# Patient Record
Sex: Male | Born: 1960
Health system: Southern US, Community
[De-identification: ages and names within clinical notes are randomized; demographics above are authoritative.]

## PROBLEM LIST (undated history)

## (undated) ENCOUNTER — Emergency Department (HOSPITAL_BASED_OUTPATIENT_CLINIC_OR_DEPARTMENT_OTHER): Discharge status: Absent without leave | Payer: 59

## (undated) DIAGNOSIS — E039 Hypothyroidism, unspecified: Secondary | ICD-10-CM

## (undated) DIAGNOSIS — F329 Major depressive disorder, single episode, unspecified: Secondary | ICD-10-CM

## (undated) DIAGNOSIS — G7111 Myotonic muscular dystrophy: Secondary | ICD-10-CM

## (undated) DIAGNOSIS — G4733 Obstructive sleep apnea (adult) (pediatric): Secondary | ICD-10-CM

## (undated) DIAGNOSIS — R42 Dizziness and giddiness: Secondary | ICD-10-CM

## (undated) DIAGNOSIS — F32A Depression, unspecified: Secondary | ICD-10-CM

## (undated) DIAGNOSIS — R911 Solitary pulmonary nodule: Secondary | ICD-10-CM

## (undated) DIAGNOSIS — J9610 Chronic respiratory failure, unspecified whether with hypoxia or hypercapnia: Secondary | ICD-10-CM

## (undated) DIAGNOSIS — R7401 Elevation of levels of liver transaminase levels: Secondary | ICD-10-CM

## (undated) DIAGNOSIS — R740 Nonspecific elevation of levels of transaminase and lactic acid dehydrogenase [LDH]: Secondary | ICD-10-CM

## (undated) DIAGNOSIS — Z85850 Personal history of malignant neoplasm of thyroid: Secondary | ICD-10-CM

## (undated) DIAGNOSIS — K219 Gastro-esophageal reflux disease without esophagitis: Secondary | ICD-10-CM

## (undated) DIAGNOSIS — I517 Cardiomegaly: Secondary | ICD-10-CM

## (undated) DIAGNOSIS — K224 Dyskinesia of esophagus: Secondary | ICD-10-CM

## (undated) DIAGNOSIS — E785 Hyperlipidemia, unspecified: Secondary | ICD-10-CM

## (undated) DIAGNOSIS — E669 Obesity, unspecified: Secondary | ICD-10-CM

## (undated) DIAGNOSIS — K5909 Other constipation: Secondary | ICD-10-CM

## (undated) DIAGNOSIS — N200 Calculus of kidney: Secondary | ICD-10-CM

## (undated) DIAGNOSIS — J690 Pneumonitis due to inhalation of food and vomit: Secondary | ICD-10-CM

## (undated) DIAGNOSIS — E66811 Obesity, class 1: Secondary | ICD-10-CM

## (undated) DIAGNOSIS — K760 Fatty (change of) liver, not elsewhere classified: Secondary | ICD-10-CM

## (undated) DIAGNOSIS — S0292XA Unspecified fracture of facial bones, initial encounter for closed fracture: Secondary | ICD-10-CM

## (undated) DIAGNOSIS — N529 Male erectile dysfunction, unspecified: Secondary | ICD-10-CM

## (undated) DIAGNOSIS — F909 Attention-deficit hyperactivity disorder, unspecified type: Secondary | ICD-10-CM

## (undated) HISTORY — DX: Depression, unspecified: F32.A

## (undated) HISTORY — DX: Fatty (change of) liver, not elsewhere classified: K76.0

## (undated) HISTORY — DX: Pneumonitis due to inhalation of food and vomit: J69.0

## (undated) HISTORY — DX: Solitary pulmonary nodule: R91.1

## (undated) HISTORY — DX: Major depressive disorder, single episode, unspecified: F32.9

## (undated) HISTORY — DX: Unspecified fracture of facial bones, initial encounter for closed fracture: S02.92XA

## (undated) HISTORY — PX: TRANSTHORACIC ECHOCARDIOGRAM: SHX275

## (undated) HISTORY — DX: Attention-deficit hyperactivity disorder, unspecified type: F90.9

## (undated) HISTORY — DX: Other constipation: K59.09

## (undated) HISTORY — DX: Obesity, unspecified: E66.9

## (undated) HISTORY — DX: Gastro-esophageal reflux disease without esophagitis: K21.9

## (undated) HISTORY — DX: Cardiomegaly: I51.7

## (undated) HISTORY — DX: Chronic respiratory failure, unspecified whether with hypoxia or hypercapnia: J96.10

## (undated) HISTORY — DX: Obesity, class 1: E66.811

## (undated) HISTORY — DX: Hypothyroidism, unspecified: E03.9

## (undated) HISTORY — PX: EXTRACORPOREAL SHOCK WAVE LITHOTRIPSY: SHX1557

## (undated) HISTORY — DX: Personal history of malignant neoplasm of thyroid: Z85.850

## (undated) HISTORY — DX: Hyperlipidemia, unspecified: E78.5

## (undated) HISTORY — DX: Dyskinesia of esophagus: K22.4

## (undated) HISTORY — DX: Nonspecific elevation of levels of transaminase and lactic acid dehydrogenase (ldh): R74.0

## (undated) HISTORY — DX: Dizziness and giddiness: R42

## (undated) HISTORY — DX: Male erectile dysfunction, unspecified: N52.9

## (undated) HISTORY — DX: Calculus of kidney: N20.0

## (undated) HISTORY — DX: Obstructive sleep apnea (adult) (pediatric): G47.33

## (undated) HISTORY — DX: Elevation of levels of liver transaminase levels: R74.01

---

## 1998-06-15 ENCOUNTER — Ambulatory Visit (HOSPITAL_COMMUNITY): Admission: RE | Admit: 1998-06-15 | Discharge: 1998-06-15 | Payer: Self-pay | Admitting: Gastroenterology

## 2007-09-03 ENCOUNTER — Ambulatory Visit: Payer: Self-pay | Admitting: Gastroenterology

## 2007-09-10 ENCOUNTER — Ambulatory Visit (HOSPITAL_COMMUNITY): Admission: RE | Admit: 2007-09-10 | Discharge: 2007-09-10 | Payer: Self-pay | Admitting: Gastroenterology

## 2007-09-13 ENCOUNTER — Ambulatory Visit: Payer: Self-pay | Admitting: Gastroenterology

## 2007-09-13 ENCOUNTER — Encounter: Payer: Self-pay | Admitting: Internal Medicine

## 2007-09-13 HISTORY — PX: COLONOSCOPY: SHX174

## 2007-09-13 LAB — HM COLONOSCOPY: HM Colonoscopy: NORMAL

## 2007-11-10 ENCOUNTER — Emergency Department (HOSPITAL_COMMUNITY): Admission: EM | Admit: 2007-11-10 | Discharge: 2007-11-10 | Payer: Self-pay | Admitting: Emergency Medicine

## 2007-11-19 ENCOUNTER — Ambulatory Visit (HOSPITAL_COMMUNITY): Admission: RE | Admit: 2007-11-19 | Discharge: 2007-11-19 | Payer: Self-pay | Admitting: Urology

## 2007-11-22 ENCOUNTER — Emergency Department (HOSPITAL_COMMUNITY): Admission: EM | Admit: 2007-11-22 | Discharge: 2007-11-22 | Payer: Self-pay | Admitting: Emergency Medicine

## 2008-03-20 DIAGNOSIS — K219 Gastro-esophageal reflux disease without esophagitis: Secondary | ICD-10-CM | POA: Insufficient documentation

## 2008-03-20 DIAGNOSIS — G4730 Sleep apnea, unspecified: Secondary | ICD-10-CM | POA: Insufficient documentation

## 2008-03-20 DIAGNOSIS — G7111 Myotonic muscular dystrophy: Secondary | ICD-10-CM

## 2008-03-20 DIAGNOSIS — F329 Major depressive disorder, single episode, unspecified: Secondary | ICD-10-CM

## 2008-06-29 ENCOUNTER — Emergency Department (HOSPITAL_COMMUNITY): Admission: EM | Admit: 2008-06-29 | Discharge: 2008-06-29 | Payer: Self-pay | Admitting: Family Medicine

## 2008-12-26 DIAGNOSIS — Z85850 Personal history of malignant neoplasm of thyroid: Secondary | ICD-10-CM

## 2008-12-26 HISTORY — DX: Personal history of malignant neoplasm of thyroid: Z85.850

## 2009-08-26 HISTORY — PX: THYROIDECTOMY: SHX17

## 2011-05-10 NOTE — Assessment & Plan Note (Signed)
[facility]                         GASTROENTEROLOGY OFFICE NOTE   [name], [name]                          MRN:          [id number]  DATE:09/03/2007                            DOB:          10-22-1961    PROBLEMS:  Dysphagia.   HISTORY OF PRESENT ILLNESS:  [name] is a 50 year old white male  referred through the courtesy of Dr.  Quintella Reichert for evaluation.  He is  complaining of dysphagia which I believe is probably due to difficulty  with deglutition.  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  He frequently develops a fullness and bad taste  in his mouth upon awakening and has some hoarseness, though, he denies  sore throat or frank regurgitation.  He has occasional pyrosis.  He has  lost almost 40 pounds over several months which he attributes to  Adderall and [employer].   PAST MEDICAL HISTORY:  1. Sleep apnea.  2. Depression.   FAMILY HISTORY:  Pertinent for a father with colon cancer.   MEDICATIONS:  Adderall 20 mg t.i.d.   ALLERGIES:  No known drug allergies.   SOCIAL HISTORY:  He does not smoke.  He drinks rarely.  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   REVIEW OF SYSTEMS:  Positive for sleeping problems, voice changes and  depression.   PHYSICAL EXAMINATION:  VITAL SIGNS:  Pulse 68, blood pressure 96/64,  weight 158.  HEENT:  EOMI.  PERRLA.  Sclerae are anicteric.  Conjunctivae are pink.  NECK:  Supple without thyromegaly, adenopathy or carotid bruits.  CHEST:  Clear to auscultation and percussion without adventitious  sounds.  CARDIAC:  Regular rhythm; normal S1 S2.  There are no murmurs, gallops  or rubs.  ABDOMEN:  Bowel sounds are normoactive.  Abdomen is soft, nontender and  nondistended.  There are no abdominal masses, tenderness, splenic  enlargement or hepatomegaly.  EXTREMITIES:  Full range of motion.  No cyanosis, clubbing or edema.  RECTAL:  Deferred.   IMPRESSION:  1. Dysphagia.  I suspect this is neurogenic in origin.  Esophageal      stricture is a possibility and ought to be ruled out, however.  2. Gastroesophageal reflux disease.  3. Family history of colorectal carcinoma.   RECOMMENDATION:  1. Barium swallow.  2. Trial of Zegerid 40 mg q.h.s.  3. Screening colonoscopy.     Barbette Hair. Arlyce Dice, MD,FACG  Electronically Signed    RDK/MedQ  DD: 09/03/2007  DT: 09/03/2007  Job #: [id number]   cc:   Quentin Mulling, MD

## 2011-05-10 NOTE — Letter (Signed)
September 03, 2007    Gerarda Fraction   RE:  ARK, AGRUSA  MRN:  [id number]  /  DOB:  [birth year]   Dear Mr. Claudette Laws:   It is my pleasure to have treated you recently as a new patient in my  office.  I appreciate your confidence and the opportunity to participate  in your care.   Since I do have a busy inpatient endoscopy schedule and office schedule,  my office hours vary weekly.  I am, however, available for emergency  calls every day through my office.  If I cannot promptly meet an urgent  office appointment, another one of our gastroenterologists will be able  to assist you.   My well-trained staff are prepared to help you at all times.  For  emergencies after office hours, a physician from our gastroenterology  section is always available through my 24-hour answering service.   While you are under my care, I encourage discussion of your questions  and concerns, and I will be happy to return your calls as soon as I am  available.   Once again, I welcome you as a new patient and I look forward to a happy  and healthy relationship.    Sincerely,      Barbette Hair. Arlyce Dice, MD,FACG  Electronically Signed   RDK/MedQ  DD: 09/03/2007  DT: 09/03/2007  Job #: [id number]

## 2011-05-10 NOTE — Letter (Signed)
September 03, 2007    Lacretia Leigh. Quintella Reichert, M.D.  [address]  [place], [place] [address]   RE:  [name], [name]  MRN:  [id number]  /  DOB:  06-03-1961   Dear Dr. Quintella Reichert:   Upon your kind referral, I had the pleasure of evaluating your patient  and I am pleased to offer my findings.  I saw in the office today.  Enclosed is a copy of my progress note that details my findings and  recommendations.   Thank you for the opportunity to participate in your patient's care.    Sincerely,      Barbette Hair. Arlyce Dice, MD,FACG  Electronically Signed    RDK/MedQ  DD: 09/03/2007  DT: 09/03/2007  Job #: [id number]

## 2011-06-26 DIAGNOSIS — R42 Dizziness and giddiness: Secondary | ICD-10-CM

## 2011-06-26 DIAGNOSIS — S0292XA Unspecified fracture of facial bones, initial encounter for closed fracture: Secondary | ICD-10-CM

## 2011-06-26 HISTORY — DX: Dizziness and giddiness: R42

## 2011-06-26 HISTORY — DX: Unspecified fracture of facial bones, initial encounter for closed fracture: S02.92XA

## 2011-07-08 ENCOUNTER — Emergency Department (HOSPITAL_COMMUNITY)
Admission: EM | Admit: 2011-07-08 | Discharge: 2011-07-08 | Disposition: A | Discharge status: Home / Self Care | Payer: 59 | Attending: Emergency Medicine | Admitting: Emergency Medicine

## 2011-07-08 ENCOUNTER — Encounter (HOSPITAL_COMMUNITY): Payer: Self-pay | Admitting: Radiology

## 2011-07-08 ENCOUNTER — Emergency Department (HOSPITAL_COMMUNITY): Payer: 59

## 2011-07-08 DIAGNOSIS — R51 Headache: Secondary | ICD-10-CM | POA: Insufficient documentation

## 2011-07-08 DIAGNOSIS — S02109A Fracture of base of skull, unspecified side, initial encounter for closed fracture: Secondary | ICD-10-CM | POA: Insufficient documentation

## 2011-07-08 DIAGNOSIS — S02401A Maxillary fracture, unspecified, initial encounter for closed fracture: Secondary | ICD-10-CM | POA: Insufficient documentation

## 2011-07-08 DIAGNOSIS — S0180XA Unspecified open wound of other part of head, initial encounter: Secondary | ICD-10-CM | POA: Insufficient documentation

## 2011-07-08 DIAGNOSIS — IMO0002 Reserved for concepts with insufficient information to code with codable children: Secondary | ICD-10-CM | POA: Insufficient documentation

## 2011-07-08 DIAGNOSIS — F988 Other specified behavioral and emotional disorders with onset usually occurring in childhood and adolescence: Secondary | ICD-10-CM | POA: Insufficient documentation

## 2011-07-08 DIAGNOSIS — W010XXA Fall on same level from slipping, tripping and stumbling without subsequent striking against object, initial encounter: Secondary | ICD-10-CM | POA: Insufficient documentation

## 2011-07-08 DIAGNOSIS — R6884 Jaw pain: Secondary | ICD-10-CM | POA: Insufficient documentation

## 2011-07-08 DIAGNOSIS — Z79899 Other long term (current) drug therapy: Secondary | ICD-10-CM | POA: Insufficient documentation

## 2011-07-08 DIAGNOSIS — G7111 Myotonic muscular dystrophy: Secondary | ICD-10-CM | POA: Insufficient documentation

## 2011-07-08 HISTORY — DX: Myotonic muscular dystrophy: G71.11

## 2011-07-15 ENCOUNTER — Encounter (HOSPITAL_COMMUNITY): Payer: Self-pay | Admitting: Radiology

## 2011-07-15 ENCOUNTER — Emergency Department (HOSPITAL_COMMUNITY): Payer: 59

## 2011-07-15 ENCOUNTER — Emergency Department (HOSPITAL_COMMUNITY)
Admission: EM | Admit: 2011-07-15 | Discharge: 2011-07-15 | Disposition: A | Discharge status: Home / Self Care | Payer: 59 | Attending: Emergency Medicine | Admitting: Emergency Medicine

## 2011-07-15 DIAGNOSIS — G7111 Myotonic muscular dystrophy: Secondary | ICD-10-CM | POA: Insufficient documentation

## 2011-07-15 DIAGNOSIS — H538 Other visual disturbances: Secondary | ICD-10-CM | POA: Insufficient documentation

## 2011-07-15 DIAGNOSIS — Z79899 Other long term (current) drug therapy: Secondary | ICD-10-CM | POA: Insufficient documentation

## 2011-07-15 DIAGNOSIS — E039 Hypothyroidism, unspecified: Secondary | ICD-10-CM | POA: Insufficient documentation

## 2011-07-15 DIAGNOSIS — R5381 Other malaise: Secondary | ICD-10-CM | POA: Insufficient documentation

## 2011-07-15 DIAGNOSIS — F988 Other specified behavioral and emotional disorders with onset usually occurring in childhood and adolescence: Secondary | ICD-10-CM | POA: Insufficient documentation

## 2011-07-15 DIAGNOSIS — R269 Unspecified abnormalities of gait and mobility: Secondary | ICD-10-CM | POA: Insufficient documentation

## 2011-07-15 DIAGNOSIS — R42 Dizziness and giddiness: Secondary | ICD-10-CM | POA: Insufficient documentation

## 2011-07-15 DIAGNOSIS — R4789 Other speech disturbances: Secondary | ICD-10-CM | POA: Insufficient documentation

## 2011-07-15 LAB — CBC
HCT: 41.9 % (ref 39.0–52.0)
Hemoglobin: 14.5 g/dL (ref 13.0–17.0)
Platelets: 159 10*3/uL (ref 150–400)

## 2011-07-15 LAB — BASIC METABOLIC PANEL
CO2: 32 mEq/L (ref 19–32)
Chloride: 101 mEq/L (ref 96–112)
GFR calc non Af Amer: >60 mL/min (ref >60)
Glucose, Bld: 82 mg/dL (ref 70–99)

## 2011-07-15 LAB — TROPONIN I: Troponin I: <0.3 ng/mL (ref <0.30)

## 2011-07-19 NOTE — Consult Note (Signed)
[name], [name]                 ACCOUNT NO.:  [id number]  MEDICAL RECORD NO.:  [id number]  LOCATION:  [facility]                         FACILITY:  [facility]  PHYSICIAN:  Kinnie Scales. Annalee Genta, M.D.DATE OF BIRTH:  1961/11/30  DATE OF CONSULTATION:  07/08/2011 DATE OF DISCHARGE:  07/08/2011                                CONSULTATION   LOCATION:  [facility] emergency department.  BRIEF HISTORY:  The patient is a 50 year old white male, who presents to the emergency department after suffering a fall earlier today.  The patient has a medical history of myotonic dystrophy which has been progressed over the last 20 years.  Symptoms include muscle weakness, wasting, and chronic dysphagia.  The patient was walking when he tripped and fell, striking his face, and primarily his left temporal region.  He was admitted to the emergency department with lacerations, bleeding, and facial swelling.  A CT scan was obtained in the emergency department, which showed nondisplaced fractures involving the left lateral orbit, the left inferior maxilla, as well as a hairline fracture along the right maxilla with bilateral pterygoid fractures.  Findings are consistent with a nondisplaced LeFort I facial fracture.  Zygomas are intact and mandibles are intact.  The patient reports moderate facial swelling and some mild discomfort.  He has mild malocclusion, but no trismus and no significant pain on jaw opening.  PHYSICAL EXAMINATION:  GENERAL:  The patient is a 50 year old white male, alert and oriented.  He is in no distress. HEENT:  Ears show normal external canals and tympanic membranes.  No hemotympanum.  Nasal cavity shows minimal bloody crusting.  Oral cavity, dentition intact.  Normal jaw mobility and normal-appearing occlusion. The facial exam shows normal extraocular mobility, moderate amount of facial swelling involving the left periorbital region, closed laceration and multiple abrasions over  the left cheek.  The patient's palate is stable.  No evidence of mobility on gentle distraction. NECK:  No crepitance or swelling.  IMPRESSION: 1. Nondisplaced bilateral LeFort I fracture. 2. Left periorbital lacerations and abrasion. 3. Progressive myotonic dystrophy.  ASSESSMENT AND PLAN:  The patient presents to the ER after suffering a fall with facial trauma.  He has a nondisplaced bilateral LeFort I fracture.  CT evaluated.  The patient evaluated at the bedside.  The palate appears stable without abnormal mobility.  The patient has normal- appearing occlusion and no trismus.  Treatment options were discussed in detail with the patient and his wife including observation with; 1. Soft diet. 2. Surgical fixation including open reduction internal fixation of the     maxilla bilaterally. 3. Possible mandibular maxillary fixation.  Given the options and risk, the patient and his wife would like to proceed with observation and soft diet.  I instructed them in soft diet including liquids and soft foods only for the next 3 weeks.  Follow up in our office in 2 weeks for reassessment or sooner as warranted.  The patient will sleep with his head of bed elevated.  Wound care precautions including ice, compress, and topical antibiotic ointment.  The patient will refrain from any nose blowing, will contact our office if he has any problems with complications.  Plan to follow up as an outpatient in the office in 2 weeks.  The patient was given our contact information.          ______________________________ Kinnie Scales Annalee Genta, M.D.     DLS/MEDQ  D:  16/09/9603  T:  07/09/2011  Job:  [id number]  Electronically Signed by Osborn Coho M.D. on 07/19/2011 12:50:15 PM

## 2011-10-04 LAB — DIFFERENTIAL
Basophils Absolute: 0
Basophils Relative: 0
Lymphocytes Relative: 9 — ABNORMAL LOW
Lymphs Abs: 0.7

## 2011-10-04 LAB — URINE MICROSCOPIC-ADD ON

## 2011-10-04 LAB — I-STAT 8, (EC8 V) (CONVERTED LAB)
BUN: 13
Chloride: 100
Operator id: 247071
Potassium: 4.1
Sodium: 135
TCO2: 34
pH, Ven: 7.353 — ABNORMAL HIGH

## 2011-10-04 LAB — URINALYSIS, ROUTINE W REFLEX MICROSCOPIC
Glucose, UA: NEGATIVE
Ketones, ur: NEGATIVE
Nitrite: NEGATIVE
Protein, ur: NEGATIVE
Urobilinogen, UA: 0.2

## 2011-10-04 LAB — CBC
Platelets: 204
RBC: 4.9

## 2011-12-07 ENCOUNTER — Encounter: Payer: Self-pay | Admitting: Family Medicine

## 2011-12-07 ENCOUNTER — Ambulatory Visit (INDEPENDENT_AMBULATORY_CARE_PROVIDER_SITE_OTHER): Payer: 59 | Admitting: Family Medicine

## 2011-12-07 ENCOUNTER — Telehealth: Payer: Self-pay | Admitting: Family Medicine

## 2011-12-07 VITALS — BP 101/66 | HR 70 | Wt 181.0 lb

## 2011-12-07 DIAGNOSIS — G7111 Myotonic muscular dystrophy: Secondary | ICD-10-CM

## 2011-12-07 DIAGNOSIS — R42 Dizziness and giddiness: Secondary | ICD-10-CM

## 2011-12-07 DIAGNOSIS — E039 Hypothyroidism, unspecified: Secondary | ICD-10-CM | POA: Insufficient documentation

## 2011-12-07 DIAGNOSIS — F329 Major depressive disorder, single episode, unspecified: Secondary | ICD-10-CM

## 2011-12-07 DIAGNOSIS — Z23 Encounter for immunization: Secondary | ICD-10-CM

## 2011-12-07 LAB — TSH: TSH: 5.46 u[IU]/mL (ref 0.35–5.50)

## 2011-12-07 NOTE — Assessment & Plan Note (Signed)
Problem stable.  Continue current medications and diet appropriate for this condition.  We have reviewed our general long term plan for this problem and also reviewed symptoms and signs that should prompt the patient to call or return to the office.  

## 2011-12-07 NOTE — Assessment & Plan Note (Signed)
Didn't get to eval this today very much. Will gather old records, have him return at his convenience in next 1-2 wks for o/v dedicated to evaluation of this complaint.

## 2011-12-07 NOTE — Assessment & Plan Note (Signed)
Has appropriate specialist f/u.

## 2011-12-07 NOTE — Assessment & Plan Note (Signed)
Unclear recent levels. Check TSH today.

## 2011-12-07 NOTE — Progress Notes (Signed)
Office Note 12/07/2011  CC:  Chief Complaint  Patient presents with  . Establish Care    HPI:  [name] is a 50 y.o. White male who is here to establish care. Patient's most recent primary MD: Dr. Janace Hoard at [facility].  Several specialists at [facility] (neuromuscular, cardiology, surgeon). Old records were not reviewed prior to or during today's visit.  Here with his wife, who is a Engineer, civil (consulting), to establish care. Concerned about thyroid level, he seems to have been told by prior PMD that his "level" was off but that he would need an endocrinologist to start managing dose changes from now on.  He has never seen an endocrinologist.    At the end of his visit today he went into his problem with recurrent dizziness spells, and unfortunately we did not have time to evaluate this today. He described intermittent feeling of vague disequilibrium, sometimes with garbled speech, sometimes with odd muscular contractions almost like myoclonus.  He is somewhat vague in description.  Seems to be posturally related at times but most of the time is unrelated to any recent position change or activity.  Past Medical History  Diagnosis Date  . Myotonic dystrophy Dx'd 1996    Initial dx by Dr. Anne Hahn, neurologist, but subsequent annual specialist f/u has been with Dr. Janene Harvey [facility].  Has generalized weakness, swallowing dysfunction.  . Depression   . History of thyroid cancer 2010    Thyroidectomy 08/2009 (Dr. Willadean Carol at [facility])  . GERD (gastroesophageal reflux disease)   . Erectile dysfunction   . Hypothyroidism   . Facial fracture 06/2011    Nondisplaced bilateral LeFort I fracture: no surgery required. (ENT, Dr. Annalee Genta)   . Dizziness 06/2011+    Noncontrast CT and noncontrast MRI both negative 06/2011 at [facility] ED visit.  . Nephrolithiasis   . S/P colonoscopy     normal around 2010 per pt's wife  . LVH (left ventricular hypertrophy)     by EKG criteria 06/2011  . Sleep apnea    ?no old records? central vs obstructive    Past Surgical History  Procedure Date  . Thyroidectomy 08/2009  . Extracorporeal shock wave lithotripsy     Family History  Problem Relation Age of Onset  . Arthritis Mother   . Hypertension Mother   . Alcohol abuse Father   . Arthritis Father   . Cancer Father     colon and prostate  . Hypertension Father     History   Social History  . Marital Status: Married    Spouse Name: N/A    Number of Children: N/A  . Years of Education: N/A   Occupational History  . Not on file.   Social History Main Topics  . Smoking status: Never Smoker   . Smokeless tobacco: Never Used  . Alcohol Use: Yes     rare beer  . Drug Use: No  . Sexually Active: Not on file   Other Topics Concern  . Not on file   Social History Narrative   Married, one son Casimiro Needle).Occupation: Public relations account executive, currently unemployed, working on appeal for medical disability as of 11/2011.  No tob or drugs.  Drinks alcohol socially.    Outpatient Encounter Prescriptions as of 12/07/2011  Medication Sig Dispense Refill  . acetaminophen (TYLENOL) 500 MG tablet Take 1,000 mg by mouth every 6 (six) hours as needed.        Marland Kitchen amphetamine-dextroamphetamine (ADDERALL) 20 MG tablet Take 20 mg by  mouth 3 (three) times daily.        . DULoxetine (CYMBALTA) 20 MG capsule Take 2 tablets by mouth every morning, take 1 tablet by mouth in the afternoon.       Marland Kitchen glycopyrrolate (ROBINUL) 1 MG tablet Take 1 mg by mouth at bedtime.        Marland Kitchen levothyroxine (SYNTHROID, LEVOTHROID) 175 MCG tablet Take 175 mcg by mouth daily.        . tadalafil (CIALIS) 20 MG tablet Take 20 mg by mouth daily as needed.        . traZODone (DESYREL) 50 MG tablet Take 100 mg by mouth at bedtime.          No Known Allergies  ROS Review of Systems  Constitutional: Negative for fever and fatigue.  HENT: Negative for congestion and sore throat.   Eyes: Negative for visual disturbance.  Respiratory:  Negative for cough.   Cardiovascular: Negative for chest pain.  Gastrointestinal: Negative for nausea and abdominal pain.  Genitourinary: Negative for dysuria.  Musculoskeletal: Negative for back pain and joint swelling.  Skin: Negative for rash.  Neurological: Positive for dizziness, speech difficulty (sometimes poor enunciation, related to myotonic dyst) and light-headedness. Negative for tremors, syncope, facial asymmetry and headaches.  Hematological: Negative for adenopathy.    PE; Blood pressure 101/66, pulse 70, weight 181 lb (82.101 kg), SpO2 95.00%. Gen: alert, mask-like facies.  Pleasant affect, lucid thought and speech, although at times he droned and spoke too soft for me to understand. ENT: Ears: EACs clear, normal epithelium.  TMs with good light reflex and landmarks bilaterally.  Eyes: no injection, icteris, swelling, or exudate.  EOMI, PERRLA. Nose: no drainage or turbinate edema/swelling.  No injection or focal lesion.  Mouth: lips without lesion/swelling.  Oral mucosa pink and slightly tacky.  Oropharynx without erythema, exudate, or swelling. Neck - No masses or thyromegaly or limitation in range of motion CV: RRR, no m/r/g.   LUNGS: CTA bilat, nonlabored resps, good aeration in all lung fields. ABD: soft, NT, ND, BS normal.  No hepatospenomegaly or mass.  No bruits. EXT: no clubbing, cyanosis, or edema.  Neuro: CN 2-12 intact bilaterally, strength 4/5 in proximal and distal upper extremities and lower extremities bilaterally.  No sensory deficits.  No tremor.  No disdiadochokinesis.   I cannot elicit any DTRs.  No pronator drift. Musculoskeletal: no joint swelling, erythema, warmth, or tenderness.  ROM of all joints intact.  Pertinent labs:  none  ASSESSMENT AND PLAN:   Hypothyroidism Unclear recent levels. Check TSH today.  Dizziness, nonspecific Didn't get to eval this today very much. Will gather old records, have him return at his convenience in next 1-2 wks  for o/v dedicated to evaluation of this complaint.  MYOTONIC MUSCULAR DYSTROPHY Has appropriate specialist f/u.  DEPRESSION Problem stable.  Continue current medications and diet appropriate for this condition.  We have reviewed our general long term plan for this problem and also reviewed symptoms and signs that should prompt the patient to call or return to the office.      Return in about 2 weeks (around 12/21/2011) for f/u dizziness spells.   ADDENDUM 12/07/11: with normal MRI/CT brain (except microvasc white matter changes), I wonder if his dizziness spells could be seizure activity. As stated above, will re-eval in office soon.

## 2011-12-07 NOTE — Telephone Encounter (Signed)
Pls request records from Dr. Janace Hoard at [facility] [address] in [place]. Also from Dr. Blain Pais at [facility] (neurolgy dept). Also from Dr. Luella Cook at [facility] (cardiology). Also from Dr. Anne Hahn at [facility] neurologic associates.  Thx!

## 2011-12-22 ENCOUNTER — Encounter: Payer: Self-pay | Admitting: Family Medicine

## 2011-12-22 ENCOUNTER — Ambulatory Visit (INDEPENDENT_AMBULATORY_CARE_PROVIDER_SITE_OTHER): Payer: 59 | Admitting: Family Medicine

## 2011-12-22 VITALS — BP 99/65 | HR 76 | Temp 97.2°F | Wt 180.0 lb

## 2011-12-22 DIAGNOSIS — R42 Dizziness and giddiness: Secondary | ICD-10-CM

## 2011-12-22 MED ORDER — GLYCOPYRROLATE 1 MG PO TABS: 1.0000 mg | ORAL_TABLET | Freq: Every day | ORAL | Status: DC

## 2011-12-22 MED ORDER — LEVOTHYROXINE SODIUM 175 MCG PO TABS: 175.0000 ug | ORAL_TABLET | Freq: Every day | ORAL | Status: DC

## 2011-12-24 ENCOUNTER — Encounter: Payer: Self-pay | Admitting: Family Medicine

## 2011-12-24 NOTE — Progress Notes (Signed)
OFFICE NOTE  12/24/2011  CC:  Chief Complaint  Patient presents with  . Follow-up    dizziness     HPI:   Patient is a 50 y.o. Caucasian male who is here for 2 wk follow up dizziness spells. Describes random, brief periods of feeling of lightheadedness, sometimes described as vertigo but most of the time not, associated with slurred speech/clenches teeth when speeking during these times, consistent sensation of wobbly legs, vague "disoriented feeling.Marland Kitchen  He stands still and holds on to something for about and it passes.  No Loc, no confusion afterwards, no loss of bowel or bladder control.  No nausea or vomiting.  No visiont or hearing changes.  No headache, No palpitations.   These episodes occurred commonly before this July when he fell and had a facial fracture, but definitiely have increased in frequency since this trauma.  Sometimes occurs several times per day, then sometimes may go a month without having a problem.  No clear relation to intake of any certain med or food.     Pertinent PMH:  Past Medical History  Diagnosis Date  . Myotonic dystrophy Dx'd 1996    Initial dx by Dr. Anne Hahn, neurologist, but subsequent annual specialist f/u has been with Dr. Janene Harvey [facility].  Has generalized weakness, swallowing dysfunction.  . Depression   . History of thyroid cancer 2010    Thyroidectomy 08/2009 (Dr. Willadean Carol at [facility])  . GERD (gastroesophageal reflux disease)   . Erectile dysfunction   . Hypothyroidism   . Facial fracture 06/2011    Nondisplaced bilateral LeFort I fracture: no surgery required. (ENT, Dr. Annalee Genta)   . Dizziness 06/2011+    Noncontrast CT and noncontrast MRI both negative 06/2011 at [facility] ED visit.  . Nephrolithiasis   . S/P colonoscopy     normal around 2010 per pt's wife  . LVH (left ventricular hypertrophy)     by EKG criteria 06/2011  . Sleep apnea     ?no old records? central vs obstructive   Past Surgical History  Procedure Date  .  Thyroidectomy 08/2009  . Extracorporeal shock wave lithotripsy    Past family and social history reviewed and there are no changes since the patient's last office visit with me.  MEDS;   Outpatient Prescriptions Prior to Visit  Medication Sig Dispense Refill  . acetaminophen (TYLENOL) 500 MG tablet Take 1,000 mg by mouth every 6 (six) hours as needed.        Marland Kitchen amphetamine-dextroamphetamine (ADDERALL) 20 MG tablet Take 20 mg by mouth 3 (three) times daily.        . DULoxetine (CYMBALTA) 20 MG capsule Take 2 tablets by mouth every morning, take 1 tablet by mouth in the afternoon.       . tadalafil (CIALIS) 20 MG tablet Take 20 mg by mouth daily as needed.        . traZODone (DESYREL) 50 MG tablet Take 100 mg by mouth at bedtime.        Marland Kitchen glycopyrrolate (ROBINUL) 1 MG tablet Take 1 mg by mouth at bedtime.        Marland Kitchen levothyroxine (SYNTHROID, LEVOTHROID) 175 MCG tablet Take 175 mcg by mouth daily.          PE: Blood pressure 99/65, pulse 76, temperature 97.2 F (36.2 C), temperature source Temporal, weight 180 lb (81.647 kg). Gen: Alert, well appearing.  Patient is oriented to person, place, time, and situation.   Flat affect, speech slow but understandable.  Neuro: CN 2-12 intact bilaterally, strength diminished in proximal and distal upper extremities and lower extremities bilaterally.  No sensory deficits.  No tremor.  No disdiadochokinesis.  No ataxia.  Upper extremity and lower extremity DTRs cannot be elicited.  No pronator drift. CV: RRR, no m/r/g.    LUNGS: CTA bilat, nonlabored resps, good aeration in all lung fields.  IMPRESSION AND PLAN:  Dizziness, nonspecific These spells are perplexing, mainly b/c I cannot get him to explain them clearly or answer my questions with any clarity. Nevertheless, I do think seizures is what they most sound like.  I told him this was something he needed to see a neurologist for and encouraged him to get a local neurologist.  He could also simply try to  go to see his myotonic dystrophy specialist.  He will think about it and get back with me about referral and/or EEG. I do not think these sound like symptoms of a dysrhythmia.  Since these episodes were occuring prior to his head trauma 06/2011, and there has been no consistent trend towards persistent worsening, I don't think further imaging of his brain is necessary (MRI and CT neg 06/2011).  Continue current meds.  FOLLOW UP:  No Follow-up on file.

## 2011-12-24 NOTE — Assessment & Plan Note (Signed)
These spells are perplexing, mainly b/c I cannot get him to explain them clearly or answer my questions with any clarity. Nevertheless, I do think seizures is what they most sound like.  I told him this was something he needed to see a neurologist for and encouraged him to get a local neurologist.  He could also simply try to go to see his myotonic dystrophy specialist.  He will think about it and get back with me about referral and/or EEG. I do not think these sound like symptoms of a dysrhythmia.

## 2011-12-27 ENCOUNTER — Encounter: Payer: Self-pay | Admitting: Family Medicine

## 2011-12-27 DIAGNOSIS — Z85850 Personal history of malignant neoplasm of thyroid: Secondary | ICD-10-CM | POA: Insufficient documentation

## 2012-01-03 ENCOUNTER — Encounter: Payer: Self-pay | Admitting: Family Medicine

## 2012-01-19 ENCOUNTER — Telehealth: Payer: Self-pay | Admitting: Family Medicine

## 2012-01-19 NOTE — Telephone Encounter (Signed)
RC to patient.  He is applying for disabiltiy and needs to see psychiatrist.  Needs to be able to see someone who can fill depression and ADD meds and have those meds followed by psych.  Appt is not urgent per patient.  Please advise referral.  Advised pt we will call him with appt info and this may take a couple of days.  He voices understanding and is agreeable.

## 2012-01-19 NOTE — Telephone Encounter (Signed)
Patient is requesting a psychiatry referral, he said that it was mentioned in his last OV

## 2012-01-23 DIAGNOSIS — C73 Malignant neoplasm of thyroid gland: Secondary | ICD-10-CM | POA: Insufficient documentation

## 2012-01-23 DIAGNOSIS — K760 Fatty (change of) liver, not elsewhere classified: Secondary | ICD-10-CM | POA: Insufficient documentation

## 2012-01-30 ENCOUNTER — Telehealth: Payer: Self-pay | Admitting: Family Medicine

## 2012-01-30 NOTE — Telephone Encounter (Signed)
Patient says he cannot sleep with the 50 MG, has been taking 2 tablets. Please send in Rx to [facility] pharmacy for 100MG  Trazodone

## 2012-01-30 NOTE — Telephone Encounter (Signed)
Dr. Milinda Cave, is this refill appropriate?

## 2012-01-31 MED ORDER — TRAZODONE HCL 100 MG PO TABS: 100.0000 mg | ORAL_TABLET | Freq: Every evening | ORAL | Status: DC | PRN

## 2012-01-31 NOTE — Telephone Encounter (Signed)
RX sent

## 2012-01-31 NOTE — Telephone Encounter (Signed)
Yes, please rx trazodone 100mg , 1 tab po qhs prn insomnia, #30, RF x5.

## 2012-02-06 ENCOUNTER — Encounter: Payer: Self-pay | Admitting: Family Medicine

## 2012-08-15 ENCOUNTER — Ambulatory Visit (INDEPENDENT_AMBULATORY_CARE_PROVIDER_SITE_OTHER): Payer: 59 | Admitting: Family Medicine

## 2012-08-15 ENCOUNTER — Encounter: Payer: Self-pay | Admitting: Family Medicine

## 2012-08-15 VITALS — BP 108/73 | HR 83 | Ht 67.0 in | Wt 188.0 lb

## 2012-08-15 DIAGNOSIS — R42 Dizziness and giddiness: Secondary | ICD-10-CM

## 2012-08-15 DIAGNOSIS — F32A Depression, unspecified: Secondary | ICD-10-CM | POA: Insufficient documentation

## 2012-08-15 DIAGNOSIS — Z13220 Encounter for screening for lipoid disorders: Secondary | ICD-10-CM

## 2012-08-15 DIAGNOSIS — E039 Hypothyroidism, unspecified: Secondary | ICD-10-CM

## 2012-08-15 DIAGNOSIS — F329 Major depressive disorder, single episode, unspecified: Secondary | ICD-10-CM | POA: Insufficient documentation

## 2012-08-15 DIAGNOSIS — Z85850 Personal history of malignant neoplasm of thyroid: Secondary | ICD-10-CM

## 2012-08-15 DIAGNOSIS — Z0000 Encounter for general adult medical examination without abnormal findings: Secondary | ICD-10-CM

## 2012-08-15 DIAGNOSIS — Z125 Encounter for screening for malignant neoplasm of prostate: Secondary | ICD-10-CM

## 2012-08-15 DIAGNOSIS — F988 Other specified behavioral and emotional disorders with onset usually occurring in childhood and adolescence: Secondary | ICD-10-CM

## 2012-08-15 DIAGNOSIS — G7111 Myotonic muscular dystrophy: Secondary | ICD-10-CM

## 2012-08-15 LAB — COMPREHENSIVE METABOLIC PANEL
ALT: 31 U/L (ref 0–53)
AST: 28 U/L (ref 0–37)
Alkaline Phosphatase: 50 U/L (ref 39–117)
Creatinine, Ser: 0.9 mg/dL (ref 0.4–1.5)
GFR: 94.25 mL/min (ref >60.00)
Sodium: 142 mEq/L (ref 135–145)
Total Bilirubin: 0.7 mg/dL (ref 0.3–1.2)
Total Protein: 7 g/dL (ref 6.0–8.3)

## 2012-08-15 LAB — CBC WITH DIFFERENTIAL/PLATELET
Eosinophils Relative: 2.3 % (ref 0.0–5.0)
HCT: 46.1 % (ref 39.0–52.0)
Hemoglobin: 14.9 g/dL (ref 13.0–17.0)
Lymphs Abs: 1.4 10*3/uL (ref 0.7–4.0)
Monocytes Relative: 7.4 % (ref 3.0–12.0)
Neutro Abs: 2.7 10*3/uL (ref 1.4–7.7)
RBC: 4.81 Mil/uL (ref 4.22–5.81)
WBC: 4.6 10*3/uL (ref 4.5–10.5)

## 2012-08-15 LAB — LIPID PANEL
Cholesterol: 242 mg/dL — ABNORMAL HIGH (ref 0–200)
HDL: 73 mg/dL (ref >39.00)
Total CHOL/HDL Ratio: 3
VLDL: 25 mg/dL (ref 0.0–40.0)

## 2012-08-15 MED ORDER — AMPHETAMINE-DEXTROAMPHET ER 20 MG PO CP24: ORAL_CAPSULE | ORAL | Status: DC

## 2012-08-15 MED ORDER — DULOXETINE HCL 60 MG PO CPEP: 120.0000 mg | ORAL_CAPSULE | ORAL | Status: DC

## 2012-08-15 MED ORDER — GLYCOPYRROLATE 1 MG PO TABS: 1.0000 mg | ORAL_TABLET | Freq: Every day | ORAL | Status: DC

## 2012-08-15 NOTE — Patient Instructions (Addendum)
Health Maintenance, Males A healthy lifestyle and preventative care can promote health and wellness.  Maintain regular health, dental, and eye exams.   Eat a healthy diet. Foods like vegetables, fruits, whole grains, low-fat dairy products, and lean protein foods contain the nutrients you need without too many calories. Decrease your intake of foods high in solid fats, added sugars, and salt. Get information about a proper diet from your caregiver, if necessary.   Regular physical exercise is one of the most important things you can do for your health. Most adults should get at least 150 minutes of moderate-intensity exercise (any activity that increases your heart rate and causes you to sweat) each week. In addition, most adults need muscle-strengthening exercises on 2 or more days a week.    Maintain a healthy weight. The body mass index (BMI) is a screening tool to identify possible weight problems. It provides an estimate of body fat based on height and weight. Your caregiver can help determine your BMI, and can help you achieve or maintain a healthy weight. For adults 20 years and older:   A BMI below 18.5 is considered underweight.   A BMI of 18.5 to 24.9 is normal.   A BMI of 25 to 29.9 is considered overweight.   A BMI of 30 and above is considered obese.   Maintain normal blood lipids and cholesterol by exercising and minimizing your intake of saturated fat. Eat a balanced diet with plenty of fruits and vegetables. Blood tests for lipids and cholesterol should begin at age 20 and be repeated every 5 years. If your lipid or cholesterol levels are high, you are over 50, or you are a high risk for heart disease, you may need your cholesterol levels checked more frequently.Ongoing high lipid and cholesterol levels should be treated with medicines, if diet and exercise are not effective.   If you smoke, find out from your caregiver how to quit. If you do not use tobacco, do not start.    If you choose to drink alcohol, do not exceed 2 drinks per day. One drink is considered to be 12 ounces (355 mL) of beer, 5 ounces (148 mL) of wine, or 1.5 ounces (44 mL) of liquor.   Avoid use of street drugs. Do not share needles with anyone. Ask for help if you need support or instructions about stopping the use of drugs.   High blood pressure causes heart disease and increases the risk of stroke. Blood pressure should be checked at least every 1 to 2 years. Ongoing high blood pressure should be treated with medicines if weight loss and exercise are not effective.   If you are 45 to 52 years old, ask your caregiver if you should take aspirin to prevent heart disease.   Diabetes screening involves taking a blood sample to check your fasting blood sugar level. This should be done once every 3 years, after age 45, if you are within normal weight and without risk factors for diabetes. Testing should be considered at a younger age or be carried out more frequently if you are overweight and have at least 1 risk factor for diabetes.   Colorectal cancer can be detected and often prevented. Most routine colorectal cancer screening begins at the age of 50 and continues through age 75. However, your caregiver may recommend screening at an earlier age if you have risk factors for colon cancer. On a yearly basis, your caregiver may provide home test kits to check for hidden   blood in the stool. Use of a small camera at the end of a tube, to directly examine the colon (sigmoidoscopy or colonoscopy), can detect the earliest forms of colorectal cancer. Talk to your caregiver about this at age 50, when routine screening begins. Direct examination of the colon should be repeated every 5 to 10 years through age 75, unless early forms of pre-cancerous polyps or small growths are found.   Hepatitis C blood testing is recommended for all people born from 1945 through 1965 and any individual with known risks for  hepatitis C.   Healthy men should no longer receive prostate-specific antigen (PSA) blood tests as part of routine cancer screening. Consult with your caregiver about prostate cancer screening.   Testicular cancer screening is not recommended for adolescents or adult males who have no symptoms. Screening includes self-exam, caregiver exam, and other screening tests. Consult with your caregiver about any symptoms you have or any concerns you have about testicular cancer.   Practice safe sex. Use condoms and avoid high-risk sexual practices to reduce the spread of sexually transmitted infections (STIs).   Use sunscreen with a sun protection factor (SPF) of 30 or greater. Apply sunscreen liberally and repeatedly throughout the day. You should seek shade when your shadow is shorter than you. Protect yourself by wearing long sleeves, pants, a wide-brimmed hat, and sunglasses year round, whenever you are outdoors.   Notify your caregiver of new moles or changes in moles, especially if there is a change in shape or color. Also notify your caregiver if a mole is larger than the size of a pencil eraser.   A one-time screening for abdominal aortic aneurysm (AAA) and surgical repair of large AAAs by sound wave imaging (ultrasonography) is recommended for ages 65 to 75 years who are current or former smokers.   Stay current with your immunizations.  Document Released: 06/09/2008 Document Revised: 12/01/2011 Document Reviewed: 05/09/2011 [employer] Patient Information 2012 [employer]. 

## 2012-08-15 NOTE — Progress Notes (Signed)
Office Note 08/19/2012  CC:  Chief Complaint  Patient presents with  . Annual Exam    needs refills on meds    HPI:  [name] is a 51 y.o. White male who is here for CPE and med RFs. I saw him for the first time 11/2011 and have not seen him since then.  He has a Advertising account executive, a cardiologist and an endocrinologist.    He asks me to manage some of his psych meds: apparently a psych NP? Or MD? has been titrating his adderall (XR 30g qAM was most recent dose) DOWN and his cymbalta UP (60 bid). He says he is not going to go back to this practitioner [facility]). He reports that cymbalta hasn't really changed his mood that much, says it makes him sleep too much (?). Sounds like he has seen a different psych MD in the past and doesn't recall name or what other meds he may have been put on.      Past Medical History  Diagnosis Date  . Myotonic dystrophy Dx'd 1996    Progressive muscle weakness and swallowing dysfunction.  Initial dx by Dr. Anne Hahn, neurologist, but subsequent annual specialist f/u has been with Dr. Janene Harvey [facility].   . Depression   . History of thyroid cancer 2010    Medullary carcinoma of thyroid. Thyroidectomy 08/2009 (Dr. Willadean Carol at [facility]).  Needs periodic serum calcitonin monitoring.  Marland Kitchen GERD (gastroesophageal reflux disease)   . Erectile dysfunction   . Hypothyroidism   . Facial fracture 06/2011    Nondisplaced bilateral LeFort I fracture: no surgery required. (ENT, Dr. Annalee Genta)   . Dizziness 06/2011+    Noncontrast CT and noncontrast MRI both negative 06/2011 at [facility] ED visit.  . Nephrolithiasis   . S/P colonoscopy     normal around 2010 per pt's wife  . LVH (left ventricular hypertrophy)     by EKG criteria 06/2011.  Hx of heart murmur--Followed by cardiology at [facility]: EF's have been "stable" per cardio office note 12/29/10.  Marland Kitchen OSA (obstructive sleep apnea)     does not wear CPAP  . Constipation, chronic     Has caused  irritative urinary/bladder symptoms in the past.  . Adult ADHD   . Elevated transaminase level     Liver biopsy revealed fatty liver  . Fatty liver disease, nonalcoholic   . Hyperlipidemia     Past Surgical History  Procedure Date  . Thyroidectomy 08/2009  . Extracorporeal shock wave lithotripsy     Family History  Problem Relation Age of Onset  . Arthritis Mother   . Hypertension Mother   . Alcohol abuse Father   . Arthritis Father   . Cancer Father     colon and prostate  . Hypertension Father     History   Social History  . Marital Status: Married    Spouse Name: N/A    Number of Children: N/A  . Years of Education: N/A   Occupational History  . Not on file.   Social History Main Topics  . Smoking status: Never Smoker   . Smokeless tobacco: Never Used  . Alcohol Use: Yes     rare beer  . Drug Use: No  . Sexually Active: Not on file   Other Topics Concern  . Not on file   Social History Narrative   Married, one son Casimiro Needle).Occupation: Public relations account executive, currently unemployed, working on appeal for medical disability as of 11/2011.  No tob  or drugs.  Drinks alcohol socially.    Outpatient Prescriptions Prior to Visit  Medication Sig Dispense Refill  . acetaminophen (TYLENOL) 500 MG tablet Take 1,000 mg by mouth every 6 (six) hours as needed.        Marland Kitchen levothyroxine (SYNTHROID, LEVOTHROID) 175 MCG tablet Take 1 tablet (175 mcg total) by mouth daily.  30 tablet  6  . tadalafil (CIALIS) 20 MG tablet Take 20 mg by mouth daily as needed.        . traZODone (DESYREL) 100 MG tablet Take 1 tablet (100 mg total) by mouth at bedtime as needed for sleep.  30 tablet  5  . glycopyrrolate (ROBINUL) 1 MG tablet Take 1 tablet (1 mg total) by mouth at bedtime.  30 tablet  6  . amphetamine-dextroamphetamine (ADDERALL) 20 MG tablet Take 20 mg by mouth 3 (three) times daily.        . DULoxetine (CYMBALTA) 20 MG capsule Take 2 tablets by mouth every morning, take 1 tablet by  mouth in the afternoon.         No Known Allergies  ROS Review of Systems  Constitutional: Negative for activity change, appetite change and fatigue (chronic).  HENT: Negative for congestion, neck pain and sinus pressure.   Eyes: Negative for pain.  Respiratory: Negative for chest tightness, shortness of breath and wheezing.   Cardiovascular: Negative for chest pain and palpitations.  Gastrointestinal: Negative for nausea, abdominal pain and abdominal distention.  Genitourinary: Negative for hematuria and testicular pain.  Skin: Negative for pallor and rash.  Neurological: Negative for seizures and light-headedness. Weakness: generalized--chronic.  Psychiatric/Behavioral: Positive for decreased concentration. Negative for suicidal ideas, behavioral problems, dysphoric mood (dysthmia) and agitation.    PE; Blood pressure 108/73, pulse 83, height 5\' 7"  (1.702 m), weight 188 lb (85.276 kg). Gen: Alert, well appearing.  Patient is oriented to person, place, time, and situation. Affect: flat, limited range of emotiona and essentially no variation in facial expression.   ENT: Ears: EACs clear, normal epithelium.  TMs with good light reflex and landmarks bilaterally.  Eyes: no injection, icteris, swelling, or exudate.  EOMI, PERRLA. Nose: no drainage or turbinate edema/swelling.  No injection or focal lesion.  Mouth: lips without lesion/swelling.  Oral mucosa pink and moist.  Dentition intact and without obvious caries or gingival swelling.  Oropharynx without erythema, exudate, or swelling.  Neck - No masses or thyromegaly or limitation in range of motion CV: RRR, no m/r/g.   LUNGS: CTA bilat, nonlabored resps, good aeration in all lung fields. ABD: soft, NT, ND, BS normal.  No hepatospenomegaly or mass.  No bruits. EXT: no clubbing, cyanosis, or edema.  Rectal exam: negative without mass, lesions or tenderness.  Prostate smooth, nontender, normal size.  Pertinent labs:  none  ASSESSMENT  AND PLAN:   Depression Patient essentially has dysthymia and is not even sure if cymbalta has helped him at all according to the specifics I can pull from him today, which seem to be limited. Will dedicate more time to this problem at future f/u's: for now will continue with cymbalta 120 mg qd. He does not plan on any further f/u with current psychiatric providers, so I will manage this at this time.  ADD (attention deficit disorder) Will increase adderall XR and change to bid 20 mg dosing.    Hypothyroidism Hx of thyroid cancer approx 2010, gets routine f/u with specialist, last TSH was 1 wk ago and apparently normal. Will try to get old  records.  MYOTONIC MUSCULAR DYSTROPHY Continue specialist f/u.  Glycopyrrolate RF'd today.  Dizziness, nonspecific He is still having these episodes (see last o/v note)--stable but unclear etiology.  Health maintenance examination Reviewed age and gender appropriate health maintenance issues (prudent diet, regular exercise, health risks of tobacco and excessive alcohol, use of seatbelts, fire alarms in home, use of sunscreen).  Also reviewed age and gender appropriate health screening as well as vaccine recommendations. Check fasting health panel today and screening PSA.   He'll continue routine f/u with neuromuscular specialist, Dr. Blain Pais, as well as his cardiologist and his endocrinologist. He has had colon cancer screening and this was normal 09/13/07 and he is on 10 yr recall. He has had dentist visit recently.    FOLLOW UP:  Return in about 1 month (around 09/15/2012) for psych f/u.

## 2012-08-19 ENCOUNTER — Encounter: Payer: Self-pay | Admitting: Family Medicine

## 2012-08-19 NOTE — Assessment & Plan Note (Signed)
Reviewed age and gender appropriate health maintenance issues (prudent diet, regular exercise, health risks of tobacco and excessive alcohol, use of seatbelts, fire alarms in home, use of sunscreen).  Also reviewed age and gender appropriate health screening as well as vaccine recommendations. Check fasting health panel today and screening PSA.   He'll continue routine f/u with neuromuscular specialist, Dr. Blain Pais, as well as his cardiologist and his endocrinologist. He has had colon cancer screening and this was normal 09/13/07 and he is on 10 yr recall. He has had dentist visit recently.

## 2012-08-19 NOTE — Assessment & Plan Note (Signed)
Patient essentially has dysthymia and is not even sure if cymbalta has helped him at all according to the specifics I can pull from him today, which seem to be limited. Will dedicate more time to this problem at future f/u's: for now will continue with cymbalta 120 mg qd. He does not plan on any further f/u with current psychiatric providers, so I will manage this at this time.

## 2012-08-19 NOTE — Assessment & Plan Note (Signed)
He is still having these episodes (see last o/v note)--stable but unclear etiology.

## 2012-08-19 NOTE — Assessment & Plan Note (Signed)
Will increase adderall XR and change to bid 20 mg dosing.

## 2012-08-19 NOTE — Assessment & Plan Note (Signed)
Continue specialist f/u.  Glycopyrrolate RF'd today.

## 2012-08-19 NOTE — Assessment & Plan Note (Signed)
Hx of thyroid cancer approx 2010, gets routine f/u with specialist, last TSH was 1 wk ago and apparently normal. Will try to get old records.

## 2012-09-13 ENCOUNTER — Encounter: Payer: Self-pay | Admitting: Family Medicine

## 2012-09-13 ENCOUNTER — Ambulatory Visit (INDEPENDENT_AMBULATORY_CARE_PROVIDER_SITE_OTHER): Payer: 59 | Admitting: Family Medicine

## 2012-09-13 VITALS — BP 101/71 | HR 82 | Ht 67.0 in | Wt 190.0 lb

## 2012-09-13 DIAGNOSIS — Z125 Encounter for screening for malignant neoplasm of prostate: Secondary | ICD-10-CM

## 2012-09-13 DIAGNOSIS — R42 Dizziness and giddiness: Secondary | ICD-10-CM

## 2012-09-13 DIAGNOSIS — G7111 Myotonic muscular dystrophy: Secondary | ICD-10-CM

## 2012-09-13 DIAGNOSIS — Z0000 Encounter for general adult medical examination without abnormal findings: Secondary | ICD-10-CM

## 2012-09-13 DIAGNOSIS — F988 Other specified behavioral and emotional disorders with onset usually occurring in childhood and adolescence: Secondary | ICD-10-CM

## 2012-09-13 DIAGNOSIS — Z85850 Personal history of malignant neoplasm of thyroid: Secondary | ICD-10-CM

## 2012-09-13 DIAGNOSIS — F329 Major depressive disorder, single episode, unspecified: Secondary | ICD-10-CM

## 2012-09-13 DIAGNOSIS — F3289 Other specified depressive episodes: Secondary | ICD-10-CM

## 2012-09-13 DIAGNOSIS — Z23 Encounter for immunization: Secondary | ICD-10-CM

## 2012-09-13 DIAGNOSIS — E039 Hypothyroidism, unspecified: Secondary | ICD-10-CM

## 2012-09-13 MED ORDER — TRAZODONE HCL 100 MG PO TABS: 100.0000 mg | ORAL_TABLET | Freq: Every evening | ORAL | Status: DC | PRN

## 2012-09-13 MED ORDER — DULOXETINE HCL 60 MG PO CPEP: 60.0000 mg | ORAL_CAPSULE | ORAL | Status: DC

## 2012-09-13 MED ORDER — BUPROPION HCL ER (XL) 150 MG PO TB24: 150.0000 mg | ORAL_TABLET | Freq: Every day | ORAL | Status: DC

## 2012-09-13 MED ORDER — AMPHETAMINE-DEXTROAMPHET ER 20 MG PO CP24: ORAL_CAPSULE | ORAL | Status: DC

## 2012-09-13 NOTE — Addendum Note (Signed)
Addended by: Jeoffrey Massed on: 09/13/2012 08:58 AM   Modules accepted: Orders

## 2012-09-13 NOTE — Progress Notes (Addendum)
OFFICE NOTE  09/13/2012  CC:  Chief Complaint  Patient presents with  . Follow-up    psych      HPI: Patient is a 51 y.o. Caucasian male who is here for f/u MDD and ADD. Since last visit I have reviewed his psych records from [facility], [facility].  This is a written record and much of it is not legible, but it appears his only psych meds since 12/2011 have been adderall in various formulations, cymbalta, and trazodone.   Specific complaints: daytime somnolence, some probs with sleep initiation--hasn't had trazodone lately, needs new rx.  Says he's forgetful, trouble finding words/feeling scatter-brained and it frustrates him, sad "sometimes", but +anhedonia, hopelessness+, isolates by just going off and going to sleep.  No crying spells.  No SI or HI.  Pertinent PMH:  Past Medical History  Diagnosis Date  . Myotonic dystrophy Dx'd 1996    Progressive muscle weakness and swallowing dysfunction.  Initial dx by Dr. Anne Hahn, neurologist, but subsequent annual specialist f/u has been with Dr. Janene Harvey [facility].   . Depression   . History of thyroid cancer 2010    Medullary carcinoma of thyroid. Thyroidectomy 08/2009 (Dr. Willadean Carol at [facility]).  Needs periodic serum calcitonin monitoring.  Marland Kitchen GERD (gastroesophageal reflux disease)   . Erectile dysfunction   . Hypothyroidism   . Facial fracture 06/2011    Nondisplaced bilateral LeFort I fracture: no surgery required. (ENT, Dr. Annalee Genta)   . Dizziness 06/2011+    Noncontrast CT and noncontrast MRI both negative 06/2011 at [facility] ED visit.  . Nephrolithiasis   . S/P colonoscopy     normal around 2010 per pt's wife  . LVH (left ventricular hypertrophy)     by EKG criteria 06/2011.  Hx of heart murmur--Followed by cardiology at [facility]: EF's have been "stable" per cardio office note 12/29/10.  Marland Kitchen OSA (obstructive sleep apnea)     does not wear CPAP  . Constipation, chronic     Has caused irritative urinary/bladder symptoms in the past.  . Adult ADHD    . Elevated transaminase level     Liver biopsy revealed fatty liver  . Fatty liver disease, nonalcoholic   . Hyperlipidemia     MEDS:  Outpatient Prescriptions Prior to Visit  Medication Sig Dispense Refill  . acetaminophen (TYLENOL) 500 MG tablet Take 1,000 mg by mouth every 6 (six) hours as needed.        Marland Kitchen glycopyrrolate (ROBINUL) 1 MG tablet Take 1 tablet (1 mg total) by mouth at bedtime.  30 tablet  6  . levothyroxine (SYNTHROID, LEVOTHROID) 175 MCG tablet Take 1 tablet (175 mcg total) by mouth daily.  30 tablet  6  . tadalafil (CIALIS) 20 MG tablet Take 20 mg by mouth daily as needed.        . traZODone (DESYREL) 100 MG tablet Take 1 tablet (100 mg total) by mouth at bedtime as needed for sleep.  30 tablet  5  . amphetamine-dextroamphetamine (ADDERALL XR) 20 MG 24 hr capsule 1 cap po bid  60 capsule  0  . DULoxetine (CYMBALTA) 60 MG capsule Take 2 capsules (120 mg total) by mouth every morning.  60 capsule  1    PE: Blood pressure 101/71, pulse 82, height 5\' 7"  (1.702 m), weight 190 lb (86.183 kg). Gen: Alert, well appearing.  Patient is oriented to person, place, time, and situation. Affect: flat, monotone, infrequent eye contact. Wt Readings from Last 2 Encounters:  09/13/12 190 lb (86.183  kg)  08/15/12 188 lb (85.276 kg)  HEENT: PERRLA, EOMI.   Neck: no LAD, mass, or thyromegaly. CV: RRR, no m/r/g LUNGS: CTA bilat, nonlabored. NEURO: no tremor or tics noted on observation.    IMPRESSION AND PLAN:  ADD (attention deficit disorder) Improved.  Continue current adderall XR dosing.    DEPRESSION Not ideally controlled. Decrease cymbalta to 60mg  qd. Start wellbutrin 150mg  qd.   FLu vaccine IM today.  FOLLOW UP:  77mo

## 2012-09-13 NOTE — Assessment & Plan Note (Signed)
Improved.  Continue current adderall XR dosing.

## 2012-09-13 NOTE — Assessment & Plan Note (Signed)
Not ideally controlled. Decrease cymbalta to 60mg  qd. Start wellbutrin 150mg  qd.

## 2012-10-24 ENCOUNTER — Ambulatory Visit: Payer: 59 | Admitting: Family Medicine

## 2013-01-08 ENCOUNTER — Encounter: Payer: Self-pay | Admitting: Family Medicine

## 2013-01-08 ENCOUNTER — Ambulatory Visit: Payer: 59 | Admitting: Family Medicine

## 2013-01-08 ENCOUNTER — Ambulatory Visit (INDEPENDENT_AMBULATORY_CARE_PROVIDER_SITE_OTHER): Payer: 59 | Admitting: Family Medicine

## 2013-01-08 VITALS — BP 117/71 | HR 77 | Ht 67.0 in | Wt 194.0 lb

## 2013-01-08 DIAGNOSIS — F3289 Other specified depressive episodes: Secondary | ICD-10-CM

## 2013-01-08 DIAGNOSIS — Z0000 Encounter for general adult medical examination without abnormal findings: Secondary | ICD-10-CM

## 2013-01-08 DIAGNOSIS — Z85850 Personal history of malignant neoplasm of thyroid: Secondary | ICD-10-CM

## 2013-01-08 DIAGNOSIS — F329 Major depressive disorder, single episode, unspecified: Secondary | ICD-10-CM

## 2013-01-08 DIAGNOSIS — R42 Dizziness and giddiness: Secondary | ICD-10-CM

## 2013-01-08 DIAGNOSIS — G7111 Myotonic muscular dystrophy: Secondary | ICD-10-CM

## 2013-01-08 DIAGNOSIS — E039 Hypothyroidism, unspecified: Secondary | ICD-10-CM

## 2013-01-08 DIAGNOSIS — F32A Depression, unspecified: Secondary | ICD-10-CM

## 2013-01-08 DIAGNOSIS — F988 Other specified behavioral and emotional disorders with onset usually occurring in childhood and adolescence: Secondary | ICD-10-CM

## 2013-01-08 DIAGNOSIS — Z125 Encounter for screening for malignant neoplasm of prostate: Secondary | ICD-10-CM

## 2013-01-08 MED ORDER — AMPHETAMINE-DEXTROAMPHET ER 20 MG PO CP24: ORAL_CAPSULE | ORAL | Status: DC

## 2013-01-08 NOTE — Assessment & Plan Note (Signed)
Pt and wife stated today that they have appt with his surgeon 01/28/13 for routine surveillance due to his hx of medullary thyroid cancer.

## 2013-01-08 NOTE — Assessment & Plan Note (Signed)
No changes in mgmt at recent f/u with neurologist at [facility] yesterday. He will soon be going back to [facility] for f/u with his cardiologist to monitor for myocardial effects from his MMD.

## 2013-01-08 NOTE — Assessment & Plan Note (Signed)
Still has dysthymia, but we made no additions to his cymbalta today. We will try again in the future to add wellbutrin but we'll focus on just trying to get his thyroid level up for the time being.

## 2013-01-08 NOTE — Assessment & Plan Note (Signed)
Poor control, medication noncompliance. We discussed ways to make it easier for him to remember to take this med every single day (use pill box, don't worry too much about taking the synthroid separate from food or other meds at this juncture--just get the med in your system).  We can focus more on the finer points of taking synthroid once thyroid level is increasing and he's thinking more clearly. Continue 175 mcg qd as his rx currently states.  Recheck TSH in 6-8 wks.

## 2013-01-08 NOTE — Progress Notes (Addendum)
OFFICE NOTE  01/08/2013  CC:  Chief Complaint  Patient presents with  . Results    discuss [facility] lab results--TSH     HPI: Patient is a 52 y.o. Caucasian male who is here for review of recent abnormal thyroid labs at his neuromuscular specialist's yesterday (TSH in the 80s, free T4 low). Not taking thyroid med as rx'd--trouble remembering, thinks he's maybe 50% compliant at best. He feels sluggish in body and mind, cold intolerance, sleeps most of the day.   Not taking adderall either mainly due to cognitive/memory dysfunction lately.  Unable to focus or finish tasks. Has mental slowing and word finding difficulty.   No SI or HI.  Seems to be taking cymbalta and trazodone correctly.  He never filled the wellbutrin XL rx I started him on a few months ago.  Again, he doesn't have a good explanation.  Pertinent PMH:  Past Medical History  Diagnosis Date  . Myotonic dystrophy Dx'd 1996    Progressive muscle weakness and swallowing dysfunction.  Initial dx by Dr. Anne Hahn, neurologist, but subsequent annual specialist f/u has been with Dr. Janene Harvey [facility].   . Depression   . History of thyroid cancer 2010    Medullary carcinoma of thyroid. Thyroidectomy 08/2009 (Dr. Willadean Carol at [facility]).  Needs periodic serum calcitonin monitoring.  Marland Kitchen GERD (gastroesophageal reflux disease)   . Erectile dysfunction   . Hypothyroidism   . Facial fracture 06/2011    Nondisplaced bilateral LeFort I fracture: no surgery required. (ENT, Dr. Annalee Genta)   . Dizziness 06/2011+    Noncontrast CT and noncontrast MRI both negative 06/2011 at [facility] ED visit.  . Nephrolithiasis   . S/P colonoscopy     normal around 2010 per pt's wife  . LVH (left ventricular hypertrophy)     by EKG criteria 06/2011.  Hx of heart murmur--Followed by cardiology at [facility]: EF's have been "stable" per cardio office note 12/29/10.  Marland Kitchen OSA (obstructive sleep apnea)     does not wear CPAP  . Constipation, chronic     Has caused  irritative urinary/bladder symptoms in the past.  . Adult ADHD   . Elevated transaminase level     Liver biopsy revealed fatty liver  . Fatty liver disease, nonalcoholic   . Hyperlipidemia     MEDS:  Outpatient Prescriptions Prior to Visit  Medication Sig Dispense Refill  . acetaminophen (TYLENOL) 500 MG tablet Take 1,000 mg by mouth every 6 (six) hours as needed.        Marland Kitchen amphetamine-dextroamphetamine (ADDERALL XR) 20 MG 24 hr capsule 1 cap po bid  60 capsule  0  . buPROPion (WELLBUTRIN XL) 150 MG 24 hr tablet Take 1 tablet (150 mg total) by mouth daily.  30 tablet  1  . DULoxetine (CYMBALTA) 60 MG capsule Take 1 capsule (60 mg total) by mouth every morning.  30 capsule  1  . levothyroxine (SYNTHROID, LEVOTHROID) 175 MCG tablet Take 1 tablet (175 mcg total) by mouth daily.  30 tablet  6  . tadalafil (CIALIS) 20 MG tablet Take 20 mg by mouth daily as needed.        . traZODone (DESYREL) 100 MG tablet Take 1 tablet (100 mg total) by mouth at bedtime as needed for sleep.  30 tablet  5  . glycopyrrolate (ROBINUL) 1 MG tablet Take 1 tablet (1 mg total) by mouth at bedtime.  30 tablet  6   Last reviewed on 01/08/2013 10:40 AM by Jeoffrey Massed,  MD  PE: Blood pressure 117/71, pulse 77, height 5\' 7"  (1.702 m), weight 194 lb (87.998 kg). Gen: alert, NAD.  Mildly disheveled-appearing.  He is in his bed clothes/pajamas. Oriented to person, place, time, situation. CV: RRR, distant S1 and S2 LUNGS: CTA bilat, nonlabored resps. Ext: no c/c/e  IMPRESSION AND PLAN:  Hypothyroidism Poor control, medication noncompliance. We discussed ways to make it easier for him to remember to take this med every single day (use pill box, don't worry too much about taking the synthroid separate from food or other meds at this juncture--just get the med in your system).  We can focus more on the finer points of taking synthroid once thyroid level is increasing and he's thinking more clearly. Continue 175 mcg qd  as his rx currently states.  Recheck TSH in 6-8 wks.  ADD (attention deficit disorder) Noncompliant with med. Restart adderall XR 20mg  bid (second dose around 3-4 pm) as per his usual regimen. Rx's for this month and next month given today (appropriate fill on/after date on rx).  MYOTONIC MUSCULAR DYSTROPHY No changes in mgmt at recent f/u with neurologist at [facility] yesterday. He will soon be going back to [facility] for f/u with his cardiologist to monitor for myocardial effects from his MMD.  DEPRESSION Still has dysthymia, but we made no additions to his cymbalta today. We will try again in the future to add wellbutrin but we'll focus on just trying to get his thyroid level up for the time being.  History of thyroid cancer Pt and wife stated today that they have appt with his surgeon 01/28/13 for routine surveillance due to his hx of medullary thyroid cancer.   An After Visit Summary was printed and given to the patient.  FOLLOW UP: 6-8 wks

## 2013-01-08 NOTE — Assessment & Plan Note (Signed)
Noncompliant with med. Restart adderall XR 20mg  bid (second dose around 3-4 pm) as per his usual regimen. Rx's for this month and next month given today (appropriate fill on/after date on rx).

## 2013-02-04 ENCOUNTER — Other Ambulatory Visit: Payer: Self-pay | Admitting: Family Medicine

## 2013-02-20 ENCOUNTER — Ambulatory Visit (INDEPENDENT_AMBULATORY_CARE_PROVIDER_SITE_OTHER): Payer: 59 | Admitting: Family Medicine

## 2013-02-20 ENCOUNTER — Encounter: Payer: Self-pay | Admitting: Family Medicine

## 2013-02-20 VITALS — BP 100/66 | HR 120 | Temp 98.1°F | Ht 67.0 in | Wt 183.0 lb

## 2013-02-20 DIAGNOSIS — F329 Major depressive disorder, single episode, unspecified: Secondary | ICD-10-CM

## 2013-02-20 DIAGNOSIS — R000 Tachycardia, unspecified: Secondary | ICD-10-CM

## 2013-02-20 DIAGNOSIS — Z85850 Personal history of malignant neoplasm of thyroid: Secondary | ICD-10-CM

## 2013-02-20 DIAGNOSIS — F988 Other specified behavioral and emotional disorders with onset usually occurring in childhood and adolescence: Secondary | ICD-10-CM

## 2013-02-20 DIAGNOSIS — E039 Hypothyroidism, unspecified: Secondary | ICD-10-CM

## 2013-02-20 DIAGNOSIS — F32A Depression, unspecified: Secondary | ICD-10-CM

## 2013-02-20 LAB — CBC WITH DIFFERENTIAL/PLATELET
Basophils Absolute: 0 10*3/uL (ref 0.0–0.1)
Basophils Relative: 1 % (ref 0–1)
Eosinophils Absolute: 0.1 10*3/uL (ref 0.0–0.7)
Eosinophils Relative: 2 % (ref 0–5)
MCH: 30.4 pg (ref 26.0–34.0)
MCV: 89.6 fL (ref 78.0–100.0)
Neutrophils Relative %: 56 % (ref 43–77)
Platelets: 206 10*3/uL (ref 150–400)
RDW: 15.3 % (ref 11.5–15.5)

## 2013-02-20 LAB — BASIC METABOLIC PANEL
CO2: 32 mEq/L (ref 19–32)
Calcium: 10 mg/dL (ref 8.4–10.5)
Creat: 0.95 mg/dL (ref 0.50–1.35)
Glucose, Bld: 98 mg/dL (ref 70–99)

## 2013-02-20 NOTE — Assessment & Plan Note (Signed)
Have to decrease adderall XR to 20mg  qd instead of bid and see if his HR comes down. Also needs to cut back on caffeine to max of one of his 16 oz diet cokes per day. May have to ween off adderall altogether if HR not improved to <100 at f/u in 2 wks.

## 2013-02-20 NOTE — Assessment & Plan Note (Signed)
With anxiety component as well as significant fatigue and excessive daytime somnolence. He is actually a bit improved since I last saw him, when his thyroid hormone level was so low. With his heart rate in the 120s, I want to gather recent cardiology w/u that he reports (echo and holter) and check TSH today. If labs today (TSH, CMET, CBC) are good and cardiology w/u reassuring then we'll see how his HR responds to decreasing his adderall XR to just one 20mg  cap qd.  If HR < 100, I'll start him on wellbutrin and ween him off his cymbalta. Another alternative would be to keep him on his cymbalta and add provigil.  Either way, we would have to watch heart rate closely. If needed, will consult his cardiologist at [facility], Dr. Bard Herbert.

## 2013-02-20 NOTE — Assessment & Plan Note (Signed)
He had recent f/u with Dr. Lendell Caprice at [facility] and he has a serum calcitonin and CEA pending at this time.

## 2013-02-20 NOTE — Assessment & Plan Note (Signed)
Need to r/o over-replacement with levothyroxine, although he certainly does not have any of the other sx's to be expected with hyperthyroidism. Apparently recent visit at cardiologists about 1 mo ago he had HR in the 90s and his cardiologist was concerned and got an EKG, Holter, and echo.  Will try to get results ASAP.  Pt says he has not been contacted about any results of these tests.

## 2013-02-20 NOTE — Progress Notes (Signed)
OFFICE NOTE  02/20/2013  CC:  Chief Complaint  Patient presents with  . Follow-up    thyroid     HPI: Patient is a 52 y.o. Caucasian male who is here for 6 wk f/u of hypothyroidism.  Last visit his TSH was very high and he was admittedly noncompliant with his levothyroxine. He reports that since that time he has taken his levothyroxine every day.  His slow cognition has improved some.  However, he still struggles with depression, periods of anxiety and anger, and chronic feeling of wanting/needing to do something but not having the energy to actually do anything.  Wife says he still sleeps a lot.  Appetite is up and down.  No SI or HI.    Denies feeling any palpitations or rapid heart beat.  Caffeine intake is approx two 16 oz diet cokes per day. No otc decongestants.   He does take his adderall XR bid b/c he reported in the past that after about 4-5 hours he doesn't feel the effects of it anymore, and we were trying to help out his energy level and focus as well as augment his antidepressant. Denies chest pain, denies dizziness, denies HA's.  No tremor, no swelling of extremities, no rashes.  Pertinent PMH:  Past Medical History  Diagnosis Date  . Myotonic dystrophy Dx'd 1996    Progressive muscle weakness and swallowing dysfunction.  Initial dx by Dr. Anne Hahn, neurologist, but subsequent annual specialist f/u has been with Dr. Janene Harvey [facility].   . Depression   . History of thyroid cancer 2010    Medullary carcinoma of thyroid. Thyroidectomy 08/2009 (Dr. Willadean Carol at [facility]).  Needs periodic serum calcitonin monitoring.  Marland Kitchen GERD (gastroesophageal reflux disease)   . Erectile dysfunction   . Hypothyroidism   . Facial fracture 06/2011    Nondisplaced bilateral LeFort I fracture: no surgery required. (ENT, Dr. Annalee Genta)   . Dizziness 06/2011+    Noncontrast CT and noncontrast MRI both negative 06/2011 at [facility] ED visit.  . Nephrolithiasis   . S/P colonoscopy     normal around  2010 per pt's wife  . LVH (left ventricular hypertrophy)     by EKG criteria 06/2011.  Hx of heart murmur--Followed by cardiology at [facility]: EF's have been "stable" per cardio office note 12/29/10.  His echo 12/2012 showed normal LV size and wall thickness but EF 45-50% and global LV hypokinesis was found.  . OSA (obstructive sleep apnea)     does not wear CPAP  . Constipation, chronic     Has caused irritative urinary/bladder symptoms in the past.  . Adult ADHD   . Elevated transaminase level     Liver biopsy revealed fatty liver  . Fatty liver disease, nonalcoholic   . Hyperlipidemia    Past Surgical History  Procedure Laterality Date  . Thyroidectomy  08/2009  . Extracorporeal shock wave lithotripsy    . Transthoracic echocardiogram  12/2012    Normal LV size and wall thickness, LV EF 45-50%, with global hypokinesis of LV.    MEDS:  Outpatient Prescriptions Prior to Visit  Medication Sig Dispense Refill  . acetaminophen (TYLENOL) 500 MG tablet Take 1,000 mg by mouth every 6 (six) hours as needed.        Marland Kitchen amphetamine-dextroamphetamine (ADDERALL XR) 20 MG 24 hr capsule 1 cap po bid  60 capsule  0  . CYMBALTA 60 MG capsule TAKE 1 CAPSULE (60 MG TOTAL) BY MOUTH EVERY MORNING.  30 capsule  1  .  famotidine (PEPCID AC) 10 MG chewable tablet Chew 10 mg by mouth as needed.      Marland Kitchen glycopyrrolate (ROBINUL) 1 MG tablet Take 1 tablet (1 mg total) by mouth at bedtime.  30 tablet  6  . levothyroxine (SYNTHROID, LEVOTHROID) 175 MCG tablet Take 1 tablet (175 mcg total) by mouth daily.  30 tablet  6  . tadalafil (CIALIS) 20 MG tablet Take 20 mg by mouth daily as needed.        . traZODone (DESYREL) 100 MG tablet Take 1 tablet (100 mg total) by mouth at bedtime as needed for sleep.  30 tablet  5   No facility-administered medications prior to visit.    PE: Blood pressure 100/66, pulse 120, temperature 98.1 F (36.7 C), temperature source Temporal, height 5\' 7"  (1.702 m), weight 183 lb (83.008 kg),  SpO2 95.00%. Gen: alert, NAD.  Oriented x 4. Affect is flat, thought and speech are lucid but he does struggle to find words and get his sentences out frequently. CV: regular rhythm, tachy to 120, no m/r/g  IMPRESSION AND PLAN:  Depression With anxiety component as well as significant fatigue and excessive daytime somnolence. He is actually a bit improved since I last saw him, when his thyroid hormone level was so low. With his heart rate in the 120s, I want to gather recent cardiology w/u that he reports (echo and holter) and check TSH today. If labs today (TSH, CMET, CBC) are good and cardiology w/u reassuring then we'll see how his HR responds to decreasing his adderall XR to just one 20mg  cap qd.  If HR < 100, I'll start him on wellbutrin and ween him off his cymbalta. Another alternative would be to keep him on his cymbalta and add provigil.  Either way, we would have to watch heart rate closely. If needed, will consult his cardiologist at [facility], Dr. Bard Herbert.   ADD (attention deficit disorder) Have to decrease adderall XR to 20mg  qd instead of bid and see if his HR comes down. Also needs to cut back on caffeine to max of one of his 16 oz diet cokes per day. May have to ween off adderall altogether if HR not improved to <100 at f/u in 2 wks.  History of thyroid cancer He had recent f/u with Dr. Lendell Caprice at [facility] and he has a serum calcitonin and CEA pending at this time.  Tachycardia Need to r/o over-replacement with levothyroxine, although he certainly does not have any of the other sx's to be expected with hyperthyroidism. Apparently recent visit at cardiologists about 1 mo ago he had HR in the 90s and his cardiologist was concerned and got an EKG, Holter, and echo.  Will try to get results ASAP.  Pt says he has not been contacted about any results of these tests.   FOLLOW UP: 2 wks

## 2013-02-21 ENCOUNTER — Telehealth: Payer: Self-pay | Admitting: Family Medicine

## 2013-02-21 LAB — T3: T3, Total: 83.5 ng/dL (ref 80.0–204.0)

## 2013-02-21 LAB — T4, FREE: Free T4: 1.63 ng/dL (ref 0.80–1.80)

## 2013-02-21 NOTE — Telephone Encounter (Signed)
Pls call pt/wife about coming back in to get an EKG (nurse visit) at their earliest convenience. Also, please see if wife/pt are absolutely sure that he wore a Holter monitor THIS year (end of January or in February) b/c I talked to his heart MD (Dr. Sharol Harness) today and he cannot see any result and the assumption is that it never got done.  If it didn't get done, we'll order it here through [facility] cardiology.  If they are positive that he did wear a Holter monitor this year then we'll need to call [facility] and track down the result somehow. Let me know-thx

## 2013-02-22 NOTE — Telephone Encounter (Signed)
Message left for wife to call re: last [facility] visit and last Holter monitor.

## 2013-02-25 NOTE — Telephone Encounter (Signed)
VM left by Harriett Sine, best number this afternoon is [phone].  Tomorrow call [phone].

## 2013-02-27 NOTE — Telephone Encounter (Signed)
Spoke with wife on phone today. She states he DID get 24h holter done--it was put on the same day as the ECHO he got there this year and he returned it the next day.  He did not turn in his event log but his wife did later mail that in to them.   Will you PLEASE use your expert investigational skills to track down this Holter result somehow? -thx

## 2013-03-06 ENCOUNTER — Ambulatory Visit (INDEPENDENT_AMBULATORY_CARE_PROVIDER_SITE_OTHER): Payer: 59 | Admitting: Family Medicine

## 2013-03-06 ENCOUNTER — Encounter: Payer: Self-pay | Admitting: Family Medicine

## 2013-03-06 VITALS — BP 98/72 | HR 105 | Ht 67.0 in | Wt 183.0 lb

## 2013-03-06 DIAGNOSIS — G7111 Myotonic muscular dystrophy: Secondary | ICD-10-CM

## 2013-03-06 DIAGNOSIS — E039 Hypothyroidism, unspecified: Secondary | ICD-10-CM

## 2013-03-06 DIAGNOSIS — R000 Tachycardia, unspecified: Secondary | ICD-10-CM

## 2013-03-06 DIAGNOSIS — F329 Major depressive disorder, single episode, unspecified: Secondary | ICD-10-CM

## 2013-03-06 MED ORDER — LEVOTHYROXINE SODIUM 150 MCG PO TABS: 150.0000 ug | ORAL_TABLET | Freq: Every day | ORAL | Status: DC

## 2013-03-06 MED ORDER — BUPROPION HCL ER (XL) 150 MG PO TB24: 150.0000 mg | ORAL_TABLET | Freq: Every day | ORAL | Status: DC

## 2013-03-06 NOTE — Progress Notes (Addendum)
OFFICE NOTE  03/06/2013  CC:  Chief Complaint  Patient presents with  . Follow-up    tachycardia, depression/fatigue     HPI: Patient is a 52 y.o. Caucasian male with muscular dystrophy who is here for 2 wk f/u of excessive daytime somnolence and tachycardia.  Labs 2 wks ago were normal, with TSH just under 1. Last visit I decreased his adderall XR to 1 20mg  cap once daily instead of bid. He feels no different. Home HR monitoring by his wife: 60s up to max of 104.  He feels not palpitations, skips, racing, or pauses. He has occasional episodes of feeling dizziness and wobbly legs but they have not correlated with low bp or high/low HR at home per wife's measurements. We are still trying to track down the Holter report from 12/2012 done at [facility].  Pertinent PMH:  Past Medical History  Diagnosis Date  . Myotonic dystrophy Dx'd 1996    Progressive muscle weakness and swallowing dysfunction.  Initial dx by Dr. Anne Hahn, neurologist, but subsequent annual specialist f/u has been with Dr. Janene Harvey [facility].   . Depression   . History of thyroid cancer 2010    Medullary carcinoma of thyroid. Thyroidectomy 08/2009 (Dr. Willadean Carol at [facility]).  Needs periodic serum calcitonin monitoring.  Marland Kitchen GERD (gastroesophageal reflux disease)   . Erectile dysfunction   . Hypothyroidism   . Facial fracture 06/2011    Nondisplaced bilateral LeFort I fracture: no surgery required. (ENT, Dr. Annalee Genta)   . Dizziness 06/2011+    Noncontrast CT and noncontrast MRI both negative 06/2011 at [facility] ED visit.  . Nephrolithiasis   . S/P colonoscopy     normal around 2010 per pt's wife  . LVH (left ventricular hypertrophy)     by EKG criteria 06/2011.  Hx of heart murmur--Followed by cardiology at [facility]: EF's have been "stable" per cardio office note 12/29/10.  His echo 12/2012 showed normal LV size and wall thickness but EF 45-50% and global LV hypokinesis was found.  . OSA (obstructive sleep apnea)     does not wear CPAP   . Constipation, chronic     Has caused irritative urinary/bladder symptoms in the past.  . Adult ADHD   . Elevated transaminase level     Liver biopsy revealed fatty liver  . Fatty liver disease, nonalcoholic   . Hyperlipidemia     MEDS:  Outpatient Prescriptions Prior to Visit  Medication Sig Dispense Refill  . acetaminophen (TYLENOL) 500 MG tablet Take 1,000 mg by mouth every 6 (six) hours as needed.        . CYMBALTA 60 MG capsule TAKE 1 CAPSULE (60 MG TOTAL) BY MOUTH EVERY MORNING.  30 capsule  1  . famotidine (PEPCID AC) 10 MG chewable tablet Chew 10 mg by mouth as needed.      . tadalafil (CIALIS) 20 MG tablet Take 20 mg by mouth daily as needed.        . traZODone (DESYREL) 100 MG tablet Take 1 tablet (100 mg total) by mouth at bedtime as needed for sleep.  30 tablet  5  . amphetamine-dextroamphetamine (ADDERALL XR) 20 MG 24 hr capsule 1 cap po bid  60 capsule  0  . levothyroxine (SYNTHROID, LEVOTHROID) 175 MCG tablet Take 1 tablet (175 mcg total) by mouth daily.  30 tablet  6  . glycopyrrolate (ROBINUL) 1 MG tablet Take 1 tablet (1 mg total) by mouth at bedtime.  30 tablet  6   No facility-administered medications  prior to visit.  His adderall XR 20mg  is qd, not bid as listed above.  Also, he is no longer taking robinul.  PE: Blood pressure 98/72, pulse 105, height 5\' 7"  (1.702 m), weight 183 lb (83.008 kg), SpO2 94.00%. Gen: alert, blank expression as per his usual.  NAD, oriented x 4.  Lucid thought and speech. CV: RRR, no m/r/g (rate about 90). LUNGS: CTA bilat, nonlabored resps.  Orthostatic bp/hr: supine 100/70, p 88, upright 108/74 p 98, standing98/72 p 105 (all measurements were separated by 1 min)  12 LEAD EKG: NSR, no ectopy.  Normal intervals and wave morphologies.    IMPRESSION AND PLAN:  Depression +daytime somnolence+ADD. Discussed options today.  Decided to add on wellbutrin XL 150mg  qd. Therapeutic expectations and side effect profile of medication  discussed today.  Patient's questions answered.   Hypothyroidism With his tachycardia issue (at least in the office), I feel like his TSH goal should be around 2.5.  Currently his TSH is <1.  Will decrease levothyroxine to 150 mcg qd. Recheck TSH in 6 wks.  MYOTONIC MUSCULAR DYSTROPHY Watching for signs of dysrhythmia. Trying to track down 24h Holter result from 12/2012 at [facility]. I think he is showing some signs of dysautonomia (random dizzy feelings/wobbly legs, feeling hot on body but cold on head at random times, chronic low-normal bps, with HR variation and symptoms that don't correlate with bp/hr measurements.   An After Visit Summary was printed and given to the patient.  FOLLOW UP: 6 wks

## 2013-03-06 NOTE — Assessment & Plan Note (Signed)
+  daytime somnolence+ADD. Discussed options today.  Decided to add on wellbutrin XL 150mg  qd. Therapeutic expectations and side effect profile of medication discussed today.  Patient's questions answered.

## 2013-03-06 NOTE — Assessment & Plan Note (Signed)
Watching for signs of dysrhythmia. Trying to track down 24h Holter result from 12/2012 at [facility]. I think he is showing some signs of dysautonomia (random dizzy feelings/wobbly legs, feeling hot on body but cold on head at random times, chronic low-normal bps, with HR variation and symptoms that don't correlate with bp/hr measurements.

## 2013-03-06 NOTE — Assessment & Plan Note (Signed)
With his tachycardia issue (at least in the office), I feel like his TSH goal should be around 2.5.  Currently his TSH is <1.  Will decrease levothyroxine to 150 mcg qd. Recheck TSH in 6 wks.

## 2013-03-20 NOTE — Telephone Encounter (Signed)
Per phone call to cardiology dept on 3/12, holter monitor was returned, but they can not find results.  [name] will look for results.  Per [facility], no holter results yet.  I will call [name] at [facility] when I return to office on 03/25/13.

## 2013-04-05 ENCOUNTER — Other Ambulatory Visit: Payer: Self-pay | Admitting: Family Medicine

## 2013-04-05 NOTE — Telephone Encounter (Signed)
eScribe request for refill on CYMBALTA Last filled - 02/04/13, #30 X 1 Last seen on - 03/06/13 Follow up - 6 WEEKS Please advise refills.

## 2013-04-08 ENCOUNTER — Other Ambulatory Visit: Payer: Self-pay | Admitting: Family Medicine

## 2013-04-08 MED ORDER — AMPHETAMINE-DEXTROAMPHET ER 20 MG PO CP24: 20.0000 mg | ORAL_CAPSULE | Freq: Every day | ORAL | Status: DC

## 2013-04-08 NOTE — Telephone Encounter (Signed)
Refill request for ADDERRAL  Last filled-03/06/13 Last seen-03/06/13 Follow up - 6 WEEKS. Please advise refills.

## 2013-04-09 NOTE — Telephone Encounter (Signed)
Message left on voicemail Rx ready to pick up.

## 2013-04-18 ENCOUNTER — Encounter: Payer: Self-pay | Admitting: Family Medicine

## 2013-04-18 ENCOUNTER — Ambulatory Visit (INDEPENDENT_AMBULATORY_CARE_PROVIDER_SITE_OTHER): Payer: 59 | Admitting: Family Medicine

## 2013-04-18 VITALS — BP 121/79 | HR 99 | Temp 97.6°F | Resp 14 | Wt 188.5 lb

## 2013-04-18 DIAGNOSIS — F329 Major depressive disorder, single episode, unspecified: Secondary | ICD-10-CM

## 2013-04-18 DIAGNOSIS — E039 Hypothyroidism, unspecified: Secondary | ICD-10-CM

## 2013-04-18 DIAGNOSIS — R000 Tachycardia, unspecified: Secondary | ICD-10-CM

## 2013-04-18 LAB — TSH: TSH: 1.03 u[IU]/mL (ref 0.35–5.50)

## 2013-04-18 MED ORDER — AMPHETAMINE-DEXTROAMPHET ER 20 MG PO CP24: 20.0000 mg | ORAL_CAPSULE | Freq: Every day | ORAL | Status: DC

## 2013-04-18 MED ORDER — BUPROPION HCL ER (XL) 300 MG PO TB24: 300.0000 mg | ORAL_TABLET | Freq: Every day | ORAL | Status: DC

## 2013-04-18 NOTE — Assessment & Plan Note (Signed)
Due for TSH check today after change in dose about 6 wks ago.

## 2013-04-18 NOTE — Progress Notes (Signed)
OFFICE NOTE  04/18/2013  CC:  Chief Complaint  Patient presents with  . Follow-up    Medicaton management [Depression w/somnolence; Hypothyroid; Tachycardia]     HPI: Patient is a 52 y.o. Caucasian male who is here for 6 week f/u depression, hypothyroidism, and myotonic MD. Last visit I started him on welbutrin XL 150mg  qd and I decreased his levoxyl to 150 mcg qd in attempt to get his TSH into the 2.5 range (was <1) due to his hx of tachycardia/dysautonomia related to his MD. HR checks consistently in 90s, bp normal.  He had one episode in which he exerted himself (carrying heavy yard equipment) and he had HR into 120s and was sweating and breathing heavy and had to sit and rest to return to baseline.  No CP or nausea. When he walks with his wife at the track at a casual pace he has no problems.  Mood is perhaps a bit brighter since getting on wellbutrin, seems a bit more engaged and willing to go out and do things with wife. Denies feeling any side effects.  Pertinent PMH:  Past Medical History  Diagnosis Date  . Myotonic dystrophy Dx'd 1996    Progressive muscle weakness and swallowing dysfunction.  Initial dx by Dr. Anne Hahn, neurologist, but subsequent annual specialist f/u has been with Dr. Janene Harvey [facility].   . Depression   . History of thyroid cancer 2010    Medullary carcinoma of thyroid. Thyroidectomy 08/2009 (Dr. Willadean Carol at [facility]).  Needs periodic serum calcitonin monitoring.  Marland Kitchen GERD (gastroesophageal reflux disease)   . Erectile dysfunction   . Hypothyroidism   . Facial fracture 06/2011    Nondisplaced bilateral LeFort I fracture: no surgery required. (ENT, Dr. Annalee Genta)   . Dizziness 06/2011+    Noncontrast CT and noncontrast MRI both negative 06/2011 at [facility] ED visit.  . Nephrolithiasis   . S/P colonoscopy     normal around 2010 per pt's wife  . LVH (left ventricular hypertrophy)     by EKG criteria 06/2011.  Hx of heart murmur--Followed by cardiology at  [facility]: EF's have been "stable" per cardio office note 12/29/10.  His echo 12/2012 showed normal LV size and wall thickness but EF 45-50% and global LV hypokinesis was found.  . OSA (obstructive sleep apnea)     does not wear CPAP  . Constipation, chronic     Has caused irritative urinary/bladder symptoms in the past.  . Adult ADHD   . Elevated transaminase level     Liver biopsy revealed fatty liver  . Fatty liver disease, nonalcoholic   . Hyperlipidemia     MEDS:  Outpatient Prescriptions Prior to Visit  Medication Sig Dispense Refill  . acetaminophen (TYLENOL) 500 MG tablet Take 1,000 mg by mouth every 6 (six) hours as needed.        Marland Kitchen amphetamine-dextroamphetamine (ADDERALL XR) 20 MG 24 hr capsule Take 1 capsule (20 mg total) by mouth daily.  30 capsule  0  . buPROPion (WELLBUTRIN XL) 150 MG 24 hr tablet Take 1 tablet (150 mg total) by mouth daily.  30 tablet  1  . DULoxetine (CYMBALTA) 60 MG capsule TAKE 1 CAPSULE BY MOUTH EVERY MORNING  30 capsule  5  . famotidine (PEPCID AC) 10 MG chewable tablet Chew 10 mg by mouth as needed.      Marland Kitchen levothyroxine (SYNTHROID, LEVOTHROID) 150 MCG tablet Take 1 tablet (150 mcg total) by mouth daily.  30 tablet  1  . tadalafil (  CIALIS) 20 MG tablet Take 20 mg by mouth daily as needed.        . traZODone (DESYREL) 100 MG tablet Take 1 tablet (100 mg total) by mouth at bedtime as needed for sleep.  30 tablet  5   No facility-administered medications prior to visit.    PE: Blood pressure 121/79, pulse 99, temperature 97.6 F (36.4 C), temperature source Oral, resp. rate 14, weight 188 lb 8 oz (85.503 kg), SpO2 97.00%. Gen: Alert, well appearing.  Patient is oriented to person, place, time, and situation. No further exam today.  IMPRESSION AND PLAN:  DEPRESSION Increase wellbutrin XL to 300mg  qd.  Hypothyroidism Due for TSH check today after change in dose about 6 wks ago.  Tachycardia Related to dysautonomia from his MD. Stable HR in 90s. He  exhibits NYHA class 2 HF sx's and this is c/w his last echo at [facility].   Continue home bp and HR checks.   I refilled his adderall rx x 3 today (for May, June, and July 2014), all with appropriate fill on/after dates.  FOLLOW UP: 49mo

## 2013-04-18 NOTE — Assessment & Plan Note (Signed)
Increase wellbutrin XL to 300mg  qd.

## 2013-04-18 NOTE — Assessment & Plan Note (Signed)
Related to dysautonomia from his MD. Stable HR in 90s. He exhibits NYHA class 2 HF sx's and this is c/w his last echo at [facility].   Continue home bp and HR checks.

## 2013-04-19 ENCOUNTER — Other Ambulatory Visit: Payer: Self-pay | Admitting: Family Medicine

## 2013-04-19 MED ORDER — LEVOTHYROXINE SODIUM 125 MCG PO TABS: 125.0000 ug | ORAL_TABLET | Freq: Every day | ORAL | Status: DC

## 2013-07-12 ENCOUNTER — Encounter: Payer: Self-pay | Admitting: Family Medicine

## 2013-07-12 ENCOUNTER — Ambulatory Visit (INDEPENDENT_AMBULATORY_CARE_PROVIDER_SITE_OTHER): Payer: 59 | Admitting: Family Medicine

## 2013-07-12 VITALS — BP 114/77 | HR 92 | Temp 97.8°F | Resp 16 | Ht 67.0 in | Wt 192.0 lb

## 2013-07-12 DIAGNOSIS — E039 Hypothyroidism, unspecified: Secondary | ICD-10-CM

## 2013-07-12 DIAGNOSIS — F988 Other specified behavioral and emotional disorders with onset usually occurring in childhood and adolescence: Secondary | ICD-10-CM

## 2013-07-12 DIAGNOSIS — K219 Gastro-esophageal reflux disease without esophagitis: Secondary | ICD-10-CM

## 2013-07-12 DIAGNOSIS — F329 Major depressive disorder, single episode, unspecified: Secondary | ICD-10-CM

## 2013-07-12 LAB — TSH: TSH: 10.93 u[IU]/mL — ABNORMAL HIGH (ref 0.35–5.50)

## 2013-07-12 MED ORDER — LISDEXAMFETAMINE DIMESYLATE 30 MG PO CAPS: 30.0000 mg | ORAL_CAPSULE | ORAL | Status: DC

## 2013-07-12 MED ORDER — BUPROPION HCL ER (XL) 150 MG PO TB24: ORAL_TABLET | ORAL | Status: DC

## 2013-07-12 NOTE — Assessment & Plan Note (Signed)
Prilosec 20mg  OTC qd trial.   D/c pepcid--he was rarely taking this anyway. Look forward to this helping with his bothersome episodes at night/during sleep/and daytime---ultimately leading to better sleep/rest and less daytime somnolence.

## 2013-07-12 NOTE — Progress Notes (Signed)
OFFICE NOTE  07/12/2013  CC:  Chief Complaint  Patient presents with  . Medication Refill  . Follow-up     HPI: Patient is a 52 y.o. Caucasian male who is here for 3 month f/u hypothyroidism, depression, and dysautonomia secondary to his myotonic dystrophy. Pt says he doesn't feel much different but wife says he is a bit more engaged. He c/o daytime somnolence more lately--has distant hx of mild OSA that wasn't to the level of requiring CPAP.  Has to sleep sitting up lately due to night-time reflux up into his mouth causing awakenings, sometimes occurs in daytime as well. He c/o again of frequent feeling of "cold on body and hot on my head".   HR checks at home usually 80-90, occ 120s after walking and feeling fatigued. Seems to feel fatigued/Sob with more casual walking, but this is intermittent. He admits he feels more depressed than last visit.  Pertinent PMH:  Past Medical History  Diagnosis Date  . Myotonic dystrophy Dx'd 1996    Progressive muscle weakness and swallowing dysfunction.  Initial dx by Dr. Anne Hahn, neurologist, but subsequent annual specialist f/u has been with Dr. Janene Harvey [facility].   . Depression   . History of thyroid cancer 2010    Medullary carcinoma of thyroid. Thyroidectomy 08/2009 (Dr. Willadean Carol at [facility]).  Needs periodic serum calcitonin monitoring.  Marland Kitchen GERD (gastroesophageal reflux disease)   . Erectile dysfunction   . Hypothyroidism   . Facial fracture 06/2011    Nondisplaced bilateral LeFort I fracture: no surgery required. (ENT, Dr. Annalee Genta)   . Dizziness 06/2011+    Noncontrast CT and noncontrast MRI both negative 06/2011 at [facility] ED visit.  . Nephrolithiasis   . S/P colonoscopy     normal around 2010 per pt's wife  . LVH (left ventricular hypertrophy)     by EKG criteria 06/2011.  Hx of heart murmur--Followed by cardiology at [facility]: EF's have been "stable" per cardio office note 12/29/10.  His echo 12/2012 showed normal LV size and wall thickness  but EF 45-50% and global LV hypokinesis was found.  . OSA (obstructive sleep apnea)     does not wear CPAP  . Constipation, chronic     Has caused irritative urinary/bladder symptoms in the past.  . Adult ADHD   . Elevated transaminase level     Liver biopsy revealed fatty liver  . Fatty liver disease, nonalcoholic   . Hyperlipidemia    Past surgical, social, and family history reviewed and no changes noted since last office visit.  MEDS:  Outpatient Prescriptions Prior to Visit  Medication Sig Dispense Refill  . acetaminophen (TYLENOL) 500 MG tablet Take 1,000 mg by mouth every 6 (six) hours as needed.        . DULoxetine (CYMBALTA) 60 MG capsule TAKE 1 CAPSULE BY MOUTH EVERY MORNING  30 capsule  5  . levothyroxine (SYNTHROID, LEVOTHROID) 125 MCG tablet Take 1 tablet (125 mcg total) by mouth daily.  30 tablet  3  . tadalafil (CIALIS) 20 MG tablet Take 20 mg by mouth daily as needed.        . traZODone (DESYREL) 100 MG tablet Take 1 tablet (100 mg total) by mouth at bedtime as needed for sleep.  30 tablet  5  . amphetamine-dextroamphetamine (ADDERALL XR) 20 MG 24 hr capsule Take 1 capsule (20 mg total) by mouth daily.  30 capsule  0  . buPROPion (WELLBUTRIN XL) 300 MG 24 hr tablet Take 1 tablet (300 mg  total) by mouth daily.  30 tablet  3  . famotidine (PEPCID AC) 10 MG chewable tablet Chew 10 mg by mouth as needed.       No facility-administered medications prior to visit.    PE: Blood pressure 114/77, pulse 92, temperature 97.8 F (36.6 C), temperature source Temporal, resp. rate 16, height 5\' 7"  (1.702 m), weight 192 lb (87.091 kg), SpO2 90.00%. Gen: tired appearing but NAD.  Oriented x 4. CV: Regular, tachy to 100. CTA bilat and nonlabored. EXT: no clubbing, cyanosis, or edema.    IMPRESSION AND PLAN:  Hypothyroidism Recheck TSH today.  We're shooting for a TSH of around 2.  DEPRESSION Not improved with addition of wellbutrin to cymbalta. Continue cymbalta. Ween  wellbutrin XL (150mg  qd x 7d, then 1 tab po qod x 7 doses). No new antidepressants today.  ADD (attention deficit disorder) Change adderall XR 20mg  qd to Vyvanse 30mg  1 qd.  I gave rx today for 30d supply and an additional rx for 30 day supply with the appropriate fill on/after dating on it. Hopefully this will help better with focus, energy, and mood but not cause any worsening of his dysautonomia (tachycardia) from his MD.  GERD Prilosec 20mg  OTC qd trial.   D/c pepcid--he was rarely taking this anyway. Look forward to this helping with his bothersome episodes at night/during sleep/and daytime---ultimately leading to better sleep/rest and less daytime somnolence.   An After Visit Summary was printed and given to the patient.  FOLLOW UP: 2 mo

## 2013-07-12 NOTE — Assessment & Plan Note (Signed)
Change adderall XR 20mg  qd to Vyvanse 30mg  1 qd.  I gave rx today for 30d supply and an additional rx for 30 day supply with the appropriate fill on/after dating on it. Hopefully this will help better with focus, energy, and mood but not cause any worsening of his dysautonomia (tachycardia) from his MD.

## 2013-07-12 NOTE — Assessment & Plan Note (Signed)
Recheck TSH today.  We're shooting for a TSH of around 2.

## 2013-07-12 NOTE — Assessment & Plan Note (Signed)
Not improved with addition of wellbutrin to cymbalta. Continue cymbalta. Ween wellbutrin XL (150mg  qd x 7d, then 1 tab po qod x 7 doses). No new antidepressants today.

## 2013-07-15 ENCOUNTER — Other Ambulatory Visit: Payer: Self-pay | Admitting: Family Medicine

## 2013-07-15 MED ORDER — LEVOTHYROXINE SODIUM 25 MCG PO TABS: ORAL_TABLET | ORAL | Status: DC

## 2013-07-16 ENCOUNTER — Encounter: Payer: Self-pay | Admitting: Family Medicine

## 2013-07-22 ENCOUNTER — Other Ambulatory Visit: Payer: Self-pay | Admitting: Family Medicine

## 2013-08-02 ENCOUNTER — Other Ambulatory Visit: Payer: Self-pay | Admitting: Family Medicine

## 2013-08-02 NOTE — Telephone Encounter (Signed)
Called pharmacy who stated that rx is ready for patient to pick up.

## 2013-09-03 ENCOUNTER — Other Ambulatory Visit: Payer: Self-pay | Admitting: Family Medicine

## 2013-09-03 NOTE — Telephone Encounter (Signed)
Patients wife called and said patient did not have anymore of his synthroid . She wasn't sure if dr was going to change dose.  I offered her a sample to pick up.  I put it up front so that his lab results would show true readings.

## 2013-09-05 ENCOUNTER — Ambulatory Visit (INDEPENDENT_AMBULATORY_CARE_PROVIDER_SITE_OTHER): Payer: 59 | Admitting: Family Medicine

## 2013-09-05 ENCOUNTER — Encounter: Payer: Self-pay | Admitting: Family Medicine

## 2013-09-05 VITALS — BP 105/74 | HR 88 | Temp 97.0°F | Resp 16 | Ht 67.0 in | Wt 193.0 lb

## 2013-09-05 DIAGNOSIS — Z23 Encounter for immunization: Secondary | ICD-10-CM

## 2013-09-05 DIAGNOSIS — F988 Other specified behavioral and emotional disorders with onset usually occurring in childhood and adolescence: Secondary | ICD-10-CM

## 2013-09-05 DIAGNOSIS — E039 Hypothyroidism, unspecified: Secondary | ICD-10-CM

## 2013-09-05 DIAGNOSIS — F3289 Other specified depressive episodes: Secondary | ICD-10-CM

## 2013-09-05 DIAGNOSIS — F329 Major depressive disorder, single episode, unspecified: Secondary | ICD-10-CM

## 2013-09-05 DIAGNOSIS — G909 Disorder of the autonomic nervous system, unspecified: Secondary | ICD-10-CM

## 2013-09-05 LAB — TSH: TSH: 2.73 u[IU]/mL (ref 0.35–5.50)

## 2013-09-05 MED ORDER — LISDEXAMFETAMINE DIMESYLATE 40 MG PO CAPS: 40.0000 mg | ORAL_CAPSULE | ORAL | Status: DC

## 2013-09-05 NOTE — Assessment & Plan Note (Signed)
No worse since ween off wellbutrin.  Stay on 60mg  cymbalta alone at this time. Will readdress further med options for this in future when I feel we've got his focus and motivation/energy better with his stimulant.

## 2013-09-05 NOTE — Assessment & Plan Note (Signed)
HR stable, nothing in the way of sx's lately but admittedly he does not stress his CV system at all. Encouraged him to try to stay minimally physically active, increase activity slowly.

## 2013-09-05 NOTE — Assessment & Plan Note (Signed)
Need to titrate vyvanse, go to 40mg  qd x 55mo. When he calls for RF at the end of the month he'll report how well he's doing on this and also report what home HR monitoring has been. Will increase dose if appropriate.

## 2013-09-05 NOTE — Progress Notes (Signed)
OFFICE NOTE  09/05/2013  CC:  Chief Complaint  Patient presents with  . Hypothyroidism    follow up     HPI: Patient is a 52 y.o. Caucasian male who is here for 2 mo f/u hypothyroidism and hx of autonomic dysfxn/tachycardia. We've been trying to minimize medication-induced tachycardia w/out making his focus, depression, or hypothyroidism worse. Due for recheck TSH today: has been on 125 mcg qd.  Lab Results  Component Value Date   TSH 10.93* 07/12/2013   He thinks he may be a bit better on the vyvanse and off the wellbutrin.  His wife says she doesn't think he's any different. Feels like vyvanse not working after about 4-5 hours.  Not motivated to do any exercise--occ he tries some things on his wii that are active.   Pertinent PMH:  Past Medical History  Diagnosis Date  . Myotonic dystrophy Dx'd 1996    Progressive muscle weakness and swallowing dysfunction.  Initial dx by Dr. Anne Hahn, neurologist, but subsequent annual specialist f/u has been with Dr. Janene Harvey [facility].   . Depression   . History of thyroid cancer 2010    Medullary carcinoma of thyroid. Thyroidectomy 08/2009 (Dr. Willadean Carol at [facility]).  Needs periodic serum calcitonin monitoring.  Marland Kitchen GERD (gastroesophageal reflux disease)   . Erectile dysfunction   . Hypothyroidism   . Facial fracture 06/2011    Nondisplaced bilateral LeFort I fracture: no surgery required. (ENT, Dr. Annalee Genta)   . Dizziness 06/2011+    Noncontrast CT and noncontrast MRI both negative 06/2011 at [facility] ED visit.  . Nephrolithiasis   . S/P colonoscopy     normal around 2010 per pt's wife  . LVH (left ventricular hypertrophy)     by EKG criteria 06/2011.  Hx of heart murmur--Followed by cardiology at [facility]: EF's have been "stable" per cardio office note 12/29/10.  His echo 12/2012 showed normal LV size and wall thickness but EF 45-50% and global LV hypokinesis was found.  . OSA (obstructive sleep apnea)     does not wear CPAP  . Constipation,  chronic     Has caused irritative urinary/bladder symptoms in the past.  . Adult ADHD   . Elevated transaminase level     Liver biopsy revealed fatty liver  . Fatty liver disease, nonalcoholic   . Hyperlipidemia    Past surgical, social, and family history reviewed and no changes noted since last office visit.  MEDS:  Outpatient Prescriptions Prior to Visit  Medication Sig Dispense Refill  . acetaminophen (TYLENOL) 500 MG tablet Take 1,000 mg by mouth every 6 (six) hours as needed.        . DULoxetine (CYMBALTA) 60 MG capsule TAKE 1 CAPSULE BY MOUTH EVERY MORNING  30 capsule  5  . levothyroxine (SYNTHROID, LEVOTHROID) 125 MCG tablet Take 1 tablet (125 mcg total) by mouth daily.  30 tablet  3  . lisdexamfetamine (VYVANSE) 30 MG capsule Take 1 capsule (30 mg total) by mouth every morning.  30 capsule  0  . tadalafil (CIALIS) 20 MG tablet Take 20 mg by mouth daily as needed.        . traZODone (DESYREL) 100 MG tablet TAKE 1 TABLET (100 MG TOTAL) BY MOUTH AT BEDTIME AS NEEDED FOR SLEEP.  30 tablet  2  . buPROPion (WELLBUTRIN XL) 150 MG 24 hr tablet 1 tab po qd x 7d, then 1 tab po qod x 7 doses, then stop  14 tablet  0   No facility-administered medications  prior to visit.    PE: Blood pressure 105/74, pulse 88, temperature 97 F (36.1 C), temperature source Temporal, resp. rate 16, height 5\' 7"  (1.702 m), weight 193 lb (87.544 kg), SpO2 100.00%. Gen: Alert, well appearing.  Patient is oriented to person, place, time, and situation. Affect: flat as per his usual--monotone.  Lucid thought and speech. CV: RRR, no m/r/g.  (rate about 90) LUNGS: CTA bilat, nonlabored resps, good aeration in all lung fields. EXT: no clubbing, cyanosis, or edema.   IMPRESSION AND PLAN:  ADD (attention deficit disorder) Need to titrate vyvanse, go to 40mg  qd x 36mo. When he calls for RF at the end of the month he'll report how well he's doing on this and also report what home HR monitoring has been. Will  increase dose if appropriate.  Autonomic dysfunction HR stable, nothing in the way of sx's lately but admittedly he does not stress his CV system at all. Encouraged him to try to stay minimally physically active, increase activity slowly.  DEPRESSION No worse since ween off wellbutrin.  Stay on 60mg  cymbalta alone at this time. Will readdress further med options for this in future when I feel we've got his focus and motivation/energy better with his stimulant.  Hypothyroidism Due for TSH recheck today.  Currently on synthroid 125mg  qd. Lab Results  Component Value Date   TSH 10.93* 07/12/2013     Flu vaccine IM today.  An After Visit Summary was printed and given to the patient.  FOLLOW UP: 67mo

## 2013-09-05 NOTE — Assessment & Plan Note (Signed)
Due for TSH recheck today.  Currently on synthroid 125mg  qd. Lab Results  Component Value Date   TSH 10.93* 07/12/2013

## 2013-09-12 ENCOUNTER — Other Ambulatory Visit: Payer: Self-pay | Admitting: Family Medicine

## 2013-10-10 ENCOUNTER — Telehealth: Payer: Self-pay | Admitting: Family Medicine

## 2013-10-10 NOTE — Telephone Encounter (Signed)
Patient needs Vyvanse refill. Patient is requesting the dosage to be increased. Please advise.

## 2013-10-10 NOTE — Telephone Encounter (Signed)
Patient last seen 09/05/13 and last rx was printed 09/05/13 also.   Should I make an appointment for him since he wants to increase dose?

## 2013-10-10 NOTE — Telephone Encounter (Signed)
OK, I can do that BUT pls call pt and ask what his heart rate has been when he has checked it since our last visit (or you may need to talk to his wife if he doesn't know the answer to this).  If HR has been ok then I'll be ok with increasing his vyvanse.   Let me know.-thx

## 2013-10-11 MED ORDER — LISDEXAMFETAMINE DIMESYLATE 40 MG PO CAPS: 40.0000 mg | ORAL_CAPSULE | ORAL | Status: DC

## 2013-10-11 NOTE — Telephone Encounter (Signed)
Patient's wife states she is out of town so she does not know what patient's HR has been reading.  Can patient just get a refill until she gets home to check on his HR?

## 2013-10-11 NOTE — Telephone Encounter (Signed)
Yes.  Vyvanse 40mg  (same dose as last month) rx printed.

## 2013-10-11 NOTE — Telephone Encounter (Signed)
Patient's wife aware.  Rx at front desk.  

## 2013-10-14 ENCOUNTER — Telehealth: Payer: Self-pay | Admitting: Emergency Medicine

## 2013-10-14 NOTE — Telephone Encounter (Signed)
Patient needs refill on Vyvanse [facility],please advise

## 2013-10-15 NOTE — Telephone Encounter (Signed)
LMOM.  Patient's rx is at front desk for pick up.  We can not just call in vyvanse.

## 2013-10-22 ENCOUNTER — Other Ambulatory Visit: Payer: Self-pay | Admitting: Family Medicine

## 2013-11-15 ENCOUNTER — Telehealth: Payer: Self-pay | Admitting: Family Medicine

## 2013-11-15 MED ORDER — LISDEXAMFETAMINE DIMESYLATE 50 MG PO CAPS: 50.0000 mg | ORAL_CAPSULE | Freq: Every day | ORAL | Status: DC

## 2013-11-15 NOTE — Telephone Encounter (Signed)
Left message on machine for patient's wife.

## 2013-11-15 NOTE — Telephone Encounter (Signed)
Patient's wife requesting refill of vyvanse.  Can patient get an increased dose?  Patient's HR has been averaging 90.  The highest it has been in 120.  He was also concerned with getting "hot and having sweats" and he wasn't sure if it was from thyroid or vyvanse.  Should patient come into office to discuss?  Patient's wife wasn't sure if you wanted him to follow up or just to call.  Please advise.

## 2013-11-15 NOTE — Telephone Encounter (Signed)
Rx for 50mg  vyvanse printed--this is a slight increase in dose. Needs office f/u in about 1 mo and we'll recheck his thyroid test at that time.-thx

## 2013-11-25 ENCOUNTER — Other Ambulatory Visit: Payer: Self-pay | Admitting: Family Medicine

## 2013-11-25 MED ORDER — TRAZODONE HCL 100 MG PO TABS: ORAL_TABLET | ORAL | Status: DC

## 2013-11-25 MED ORDER — LEVOTHYROXINE SODIUM 125 MCG PO TABS: ORAL_TABLET | ORAL | Status: DC

## 2013-11-25 MED ORDER — DULOXETINE HCL 60 MG PO CPEP: ORAL_CAPSULE | ORAL | Status: DC

## 2013-11-25 NOTE — Telephone Encounter (Signed)
Patient called requesting refill of his trazadone (last filled 07/22/13 x 2 refills.) cymbalta and levothyroxine.  Patient last seen 09/05/13.  Please advise refills.

## 2013-11-25 NOTE — Telephone Encounter (Signed)
Patient requesting to pick up Rx tomorrow afternoon. Please call when ready to pick up.

## 2013-12-27 ENCOUNTER — Encounter: Payer: Self-pay | Admitting: Family Medicine

## 2013-12-27 ENCOUNTER — Ambulatory Visit (INDEPENDENT_AMBULATORY_CARE_PROVIDER_SITE_OTHER): Payer: 59 | Admitting: Family Medicine

## 2013-12-27 VITALS — BP 110/76 | HR 112 | Temp 97.4°F | Resp 18 | Ht 67.0 in | Wt 196.0 lb

## 2013-12-27 DIAGNOSIS — F988 Other specified behavioral and emotional disorders with onset usually occurring in childhood and adolescence: Secondary | ICD-10-CM

## 2013-12-27 DIAGNOSIS — G7111 Myotonic muscular dystrophy: Secondary | ICD-10-CM

## 2013-12-27 DIAGNOSIS — E039 Hypothyroidism, unspecified: Secondary | ICD-10-CM

## 2013-12-27 DIAGNOSIS — G909 Disorder of the autonomic nervous system, unspecified: Secondary | ICD-10-CM

## 2013-12-27 LAB — TSH: TSH: 3.91 u[IU]/mL (ref 0.350–4.500)

## 2013-12-27 MED ORDER — LISDEXAMFETAMINE DIMESYLATE 60 MG PO CAPS: 60.0000 mg | ORAL_CAPSULE | ORAL | Status: DC

## 2013-12-27 MED ORDER — LISDEXAMFETAMINE DIMESYLATE 60 MG PO CAPS
60.0000 mg | ORAL_CAPSULE | ORAL | Status: DC
Start: 2013-12-27 — End: 2013-12-27

## 2013-12-27 NOTE — Progress Notes (Signed)
OFFICE NOTE  12/27/2013  CC:  Chief Complaint  Patient presents with  . Follow-up     HPI: Patient is a 53 y.o. Caucasian male who is here for 4 month f/u adult ADD, depression, and autonomic dysfunction associated with his myotonic muscular dystrophy. Mood has been pretty stable, remains pretty much dysthymic but no acute worsening in the last few months.  He did not take the wellbutrin b/c he thought it was making him feel worse.  Heart rates checked at home: resting 80s-90s.  With prolonged walking he gets dizzy, shaky legs, breathlessness. Has f/u with Dr. Tillman Abide, neuromuscular specialist, and they usually get set up with cardiologist f/u at these visits.  Still probs with EDS, sleep schedule is off quite a bit. He reports being much more compliant with his meds but he is nearly out of all of them.   Pertinent PMH:  Past Medical History  Diagnosis Date  . Myotonic dystrophy Dx'd 1996    Progressive muscle weakness and swallowing dysfunction.  Initial dx by Dr. Jannifer Franklin, neurologist, but subsequent annual specialist f/u has been with Dr. Ladon Applebaum [facility].   . Depression   . History of thyroid cancer 2010    Medullary carcinoma of thyroid. Thyroidectomy 08/2009 (Dr. Aida Puffer at [facility]).  Needs periodic serum calcitonin monitoring.  Marland Kitchen GERD (gastroesophageal reflux disease)   . Erectile dysfunction   . Hypothyroidism   . Facial fracture 06/2011    Nondisplaced bilateral LeFort I fracture: no surgery required. (ENT, Dr. Wilburn Cornelia)   . Dizziness 06/2011+    Noncontrast CT and noncontrast MRI both negative 06/2011 at [facility] ED visit.  . Nephrolithiasis   . S/P colonoscopy     normal around 2010 per pt's wife  . LVH (left ventricular hypertrophy)     by EKG criteria 06/2011.  Hx of heart murmur--Followed by cardiology at [facility]: EF's have been "stable" per cardio office note 12/29/10.  His echo 12/2012 showed normal LV size and wall thickness but EF 45-50% and global LV hypokinesis was  found.  . OSA (obstructive sleep apnea)     does not wear CPAP  . Constipation, chronic     Has caused irritative urinary/bladder symptoms in the past.  . Adult ADHD   . Elevated transaminase level     Liver biopsy revealed fatty liver  . Fatty liver disease, nonalcoholic   . Hyperlipidemia    Past Surgical History  Procedure Laterality Date  . Thyroidectomy  08/2009  . Extracorporeal shock wave lithotripsy    . Transthoracic echocardiogram  12/2012    Normal LV size and wall thickness, LV EF 45-50%, with global hypokinesis of LV.    MEDS:  Outpatient Prescriptions Prior to Visit  Medication Sig Dispense Refill  . acetaminophen (TYLENOL) 500 MG tablet Take 1,000 mg by mouth every 6 (six) hours as needed.        . DULoxetine (CYMBALTA) 60 MG capsule TAKE 1 CAPSULE BY MOUTH EVERY MORNING  30 capsule  6  . levothyroxine (SYNTHROID, LEVOTHROID) 125 MCG tablet TAKE 1 TABLET BY MOUTH ONCE DAILY  30 tablet  6  . lisdexamfetamine (VYVANSE) 50 MG capsule Take 1 capsule (50 mg total) by mouth daily.  30 capsule  0  . traZODone (DESYREL) 100 MG tablet TAKE 1 TABLET (100 MG TOTAL) BY MOUTH AT BEDTIME AS NEEDED FOR SLEEP.  30 tablet  6  . buPROPion (WELLBUTRIN XL) 150 MG 24 hr tablet 1 tab po qd x 7d, then 1 tab  po qod x 7 doses, then stop  14 tablet  0  . tadalafil (CIALIS) 20 MG tablet Take 20 mg by mouth daily as needed.         No facility-administered medications prior to visit.    PE: Blood pressure 110/76, pulse 112, temperature 97.4 F (36.3 C), temperature source Temporal, resp. rate 18, height 5\' 7"  (1.702 m), weight 196 lb (88.905 kg), SpO2 93.00%. Wt Readings from Last 2 Encounters:  12/27/13 196 lb (88.905 kg)  09/05/13 193 lb (87.544 kg)  Gen: alert, oriented x 4, affect is flat as per his usual.  Speech is sometimes slow and a bit slurred as per his usual. HEENT: PERRLA, EOMI.   Neck: no LAD, mass, or thyromegaly. CV: RRR, no m/r/g LUNGS: CTA bilat, nonlabored. NEURO: no  tremor or tics noted on observation.    IMPRESSION AND PLAN:  ADD (attention deficit disorder) Improving with slow up-titration of vyvanse. Increase to 60mg  qd dosing today. I printed rx's for vyvanse 60mg , 1 qd, #30 today for this month, feb, and mar 2015.  Appropriate fill on/after date was noted on each rx.   Autonomic dysfunction HR staying pretty stable/acceptable even as we slowly increase his vyvanse for his ADD. He has cardiology f/u at [facility] soon.  MYOTONIC MUSCULAR DYSTROPHY Chronic fatigue and generalized weakness is stable. He has f/u with his neuromuscular specialist at [facility] soon.   An After Visit Summary was printed and given to the patient.  FOLLOW UP: 4 mo

## 2014-01-05 NOTE — Assessment & Plan Note (Signed)
Chronic fatigue and generalized weakness is stable. He has f/u with his neuromuscular specialist at [facility] soon.

## 2014-01-05 NOTE — Assessment & Plan Note (Signed)
HR staying pretty stable/acceptable even as we slowly increase his vyvanse for his ADD. He has cardiology f/u at [facility] soon.

## 2014-01-05 NOTE — Assessment & Plan Note (Signed)
Improving with slow up-titration of vyvanse. Increase to 60mg  qd dosing today. I printed rx's for vyvanse 60mg , 1 qd, #30 today for this month, feb, and mar 2015.  Appropriate fill on/after date was noted on each rx.

## 2014-03-13 ENCOUNTER — Encounter: Payer: Self-pay | Admitting: Family Medicine

## 2014-03-13 ENCOUNTER — Ambulatory Visit (INDEPENDENT_AMBULATORY_CARE_PROVIDER_SITE_OTHER): Payer: 59 | Admitting: Family Medicine

## 2014-03-13 VITALS — BP 94/61 | HR 77 | Temp 97.4°F | Resp 18 | Ht 67.0 in | Wt 193.0 lb

## 2014-03-13 DIAGNOSIS — A084 Viral intestinal infection, unspecified: Secondary | ICD-10-CM | POA: Insufficient documentation

## 2014-03-13 DIAGNOSIS — G909 Disorder of the autonomic nervous system, unspecified: Secondary | ICD-10-CM

## 2014-03-13 DIAGNOSIS — I428 Other cardiomyopathies: Secondary | ICD-10-CM

## 2014-03-13 DIAGNOSIS — A088 Other specified intestinal infections: Secondary | ICD-10-CM

## 2014-03-13 DIAGNOSIS — G7111 Myotonic muscular dystrophy: Secondary | ICD-10-CM

## 2014-03-13 MED ORDER — PROMETHAZINE HCL 12.5 MG PO TABS: ORAL_TABLET | ORAL | Status: DC

## 2014-03-13 NOTE — Progress Notes (Signed)
OFFICE NOTE  03/13/2014  CC:  Chief Complaint  Patient presents with  . Emesis    a few days   . Diarrhea     HPI: Patient is a 53 y.o. Caucasian male who is here for n/v/d. Onset about 2d/a of nausea, vomiting, diarrhea, intermittent cramping.  No blood or pus in stool. No fevers.  Some lightheadedness since onset. Able to tolerate some bland foods and acidic cola drinks (but not water).  Sx's slightly improving today. No otc meds tried.  Has not taken his chronic meds last couple days. Says urine output "maybe down a little". No known sick contacts, no recent travel or abnormal foods ingested.    Pertinent PMH:  Past medical, surgical, social, and family history reviewed and no changes are noted since last office visit.  MEDS:  Outpatient Prescriptions Prior to Visit  Medication Sig Dispense Refill  . DULoxetine (CYMBALTA) 60 MG capsule TAKE 1 CAPSULE BY MOUTH EVERY MORNING  30 capsule  6  . levothyroxine (SYNTHROID, LEVOTHROID) 125 MCG tablet TAKE 1 TABLET BY MOUTH ONCE DAILY  30 tablet  6  . lisdexamfetamine (VYVANSE) 60 MG capsule Take 1 capsule (60 mg total) by mouth every morning.  30 capsule  0  . tadalafil (CIALIS) 20 MG tablet Take 20 mg by mouth daily as needed.        . traZODone (DESYREL) 100 MG tablet TAKE 1 TABLET (100 MG TOTAL) BY MOUTH AT BEDTIME AS NEEDED FOR SLEEP.  30 tablet  6  . acetaminophen (TYLENOL) 500 MG tablet Take 1,000 mg by mouth every 6 (six) hours as needed.         No facility-administered medications prior to visit.    PE: Blood pressure 94/61, pulse 77, temperature 97.4 F (36.3 C), temperature source Oral, resp. rate 18, height 5\' 7"  (1.702 m), weight 193 lb (87.544 kg), SpO2 92.00%. Gen: Alert, tired-appearing and a bit disheveled, but nontoxic (this is his usual appearance).  Patient is oriented to person, place, time, and situation. NOB:SJGG: no injection, icteris, swelling, or exudate.  EOMI, PERRLA. Mouth: lips without  lesion/swelling.  Oral mucosa pink and moist. Oropharynx without erythema, exudate, or swelling.  Neck - No masses or thyromegaly or limitation in range of motion CV: RRR, no m/r/g.   LUNGS: CTA bilat, nonlabored resps, good aeration in all lung fields. ABD: soft, mild diffuse mid abdominal/epigastric regions TTP, without guarding or rebound.  BS slightly hypoactive.  No HSM or bruit or mass. EXT: no clubbing, cyanosis, or edema.   LABS: none  IMPRESSION AND PLAN:  Viral GE; discussed use of phenergan (rx'd today), rehydration with appropriate fluids, gradually advance diet. Rest.  Sx's seem to be resolving some today.  Signs/symptoms to call or return for were reviewed and pt expressed understanding.  An After Visit Summary was printed and given to the patient.  FOLLOW UP: prn

## 2014-03-13 NOTE — Patient Instructions (Signed)
Drink gatorade, poweraid, water, pop-sickles, and jello as much as possible.

## 2014-03-13 NOTE — Progress Notes (Signed)
Pre visit review using our clinic review tool, if applicable. No additional management support is needed unless otherwise documented below in the visit note. 

## 2014-03-14 NOTE — Addendum Note (Signed)
Addended by: Tammi Sou on: 03/14/2014 09:17 AM   Modules accepted: Orders

## 2014-04-09 ENCOUNTER — Encounter: Payer: Self-pay | Admitting: Cardiovascular Disease

## 2014-04-09 ENCOUNTER — Ambulatory Visit (INDEPENDENT_AMBULATORY_CARE_PROVIDER_SITE_OTHER): Payer: 59 | Admitting: Cardiovascular Disease

## 2014-04-09 VITALS — BP 128/82 | HR 97 | Ht 66.0 in | Wt 197.0 lb

## 2014-04-09 DIAGNOSIS — R000 Tachycardia, unspecified: Secondary | ICD-10-CM

## 2014-04-09 DIAGNOSIS — G7111 Myotonic muscular dystrophy: Secondary | ICD-10-CM

## 2014-04-09 DIAGNOSIS — I519 Heart disease, unspecified: Secondary | ICD-10-CM

## 2014-04-09 NOTE — Progress Notes (Signed)
04/09/2014 [name]   [birth year]  [id number]  Primary Physician Tammi Sou, MD Primary Cardiologist: Lorretta Harp MD Renae Gloss   HPI:  [name] is a 53 year old moderately overweight married Caucasian male father of one child referred by Dr. Ruby Cola for cardiovascular evaluation and to be established in our practice. He has a history of myotonic dystrophy. He is 3 children amongst 9 who have this diagnosis. This is manifested by tonic-clonic activity as well as proximal muscle wasting. His other problems include clinical depression for 15 years. He said thyroid cancer thyroidectomy. He also has obstructive sleep apnea currently not on CPAP. He has never had a heart attack or stroke. He denies chest pain or shortness of breath.His echo 12/2012 showed normal LV size and wall thickness but EF 45-50% and global LV hypokinesis was found.     Current Outpatient Prescriptions  Medication Sig Dispense Refill  . acetaminophen (TYLENOL) 500 MG tablet Take 1,000 mg by mouth every 6 (six) hours as needed.        . DULoxetine (CYMBALTA) 60 MG capsule TAKE 1 CAPSULE BY MOUTH EVERY MORNING  30 capsule  6  . famotidine (PEPCID AC) 10 MG chewable tablet Chew by mouth.      . levothyroxine (SYNTHROID, LEVOTHROID) 150 MCG tablet Take 150 mcg by mouth daily before breakfast.      . lisdexamfetamine (VYVANSE) 60 MG capsule Take 1 capsule (60 mg total) by mouth every morning.  30 capsule  0  . promethazine (PHENERGAN) 12.5 MG tablet 1-2 tabs po q6h prn nausea30  30 tablet  1  . traZODone (DESYREL) 100 MG tablet TAKE 1 TABLET (100 MG TOTAL) BY MOUTH AT BEDTIME AS NEEDED FOR SLEEP.  30 tablet  6   No current facility-administered medications for this visit.    No Known Allergies  History   Social History  . Marital Status: Married    Spouse Name: N/A    Number of Children: N/A  . Years of Education: N/A   Occupational History  . Not on file.   Social History  Main Topics  . Smoking status: Never Smoker   . Smokeless tobacco: Never Used  . Alcohol Use: Yes     Comment: rare beer  . Drug Use: No  . Sexual Activity: Not on file   Other Topics Concern  . Not on file   Social History Narrative   Married, one son [name]).   Occupation: Market researcher, currently unemployed, working on appeal for medical disability as of 11/2011.  No tob or drugs.  Drinks alcohol socially.     Review of Systems: General: negative for chills, fever, night sweats or weight changes.  Cardiovascular: negative for chest pain, dyspnea on exertion, edema, orthopnea, palpitations, paroxysmal nocturnal dyspnea or shortness of breath Dermatological: negative for rash Respiratory: negative for cough or wheezing Urologic: negative for hematuria Abdominal: negative for nausea, vomiting, diarrhea, bright red blood per rectum, melena, or hematemesis Neurologic: negative for visual changes, syncope, or dizziness All other systems reviewed and are otherwise negative except as noted above.    Blood pressure 128/82, pulse 97, height 5\' 6"  (1.676 m), weight 197 lb (89.359 kg).  General appearance: alert and no distress Neck: no adenopathy, no carotid bruit, no JVD, supple, symmetrical, trachea midline and thyroid not enlarged, symmetric, no tenderness/mass/nodules Lungs: clear to auscultation bilaterally Heart: regular rate and rhythm, S1, S2 normal, no murmur, click, rub or gallop Extremities: extremities normal, atraumatic, no  cyanosis or edema  EKG normal sinus rhythm at 86 without ST or T wave changes  ASSESSMENT AND PLAN:   MYOTONIC MUSCULAR DYSTROPHY Patient was sent to be established in our cardiology practice because of myotonic dystrophy. He is one of 3 children among Who have this condition. He he was followed by a cardiologist at [facility] with 2-D echocardiograms. The last one was performed 3 years ago and showed a mildly reduced  ejection fraction. He has no other cardiovascular factors is otherwise asymptomatic. We will recheck a 2-D echocardiogram.      Lorretta Harp MD [facility], [facility] 04/09/2014 3:13 PM

## 2014-04-09 NOTE — Assessment & Plan Note (Signed)
Patient was sent to be established in our cardiology practice because of myotonic dystrophy. He is one of 3 children among Who have this condition. He he was followed by a cardiologist at [facility] with 2-D echocardiograms. The last one was performed 3 years ago and showed a mildly reduced ejection fraction. He has no other cardiovascular factors is otherwise asymptomatic. We will recheck a 2-D echocardiogram.

## 2014-04-09 NOTE — Patient Instructions (Signed)
Your physician recommends that you schedule a follow-up appointment in: 1 year  Your physician has requested that you have an echocardiogram. Echocardiography is a painless test that uses sound waves to create images of your heart. It provides your doctor with information about the size and shape of your heart and how well your heart's chambers and valves are working. This procedure takes approximately one hour. There are no restrictions for this procedure.    

## 2014-04-17 ENCOUNTER — Telehealth: Payer: Self-pay | Admitting: Family Medicine

## 2014-04-17 MED ORDER — LISDEXAMFETAMINE DIMESYLATE 60 MG PO CAPS: 60.0000 mg | ORAL_CAPSULE | ORAL | Status: DC

## 2014-04-17 NOTE — Telephone Encounter (Signed)
Patient requesting refill of vyvanse.  Last OV was 04/06/14.  Last Rx said to fill on or after 02/23/14.  Please advise refill.

## 2014-04-17 NOTE — Telephone Encounter (Signed)
Printed vyvanse rx's for this month and for May 2015.  Needs routine office f/u in June.-thx

## 2014-04-17 NOTE — Telephone Encounter (Signed)
Patient is requesting refill on vyvanse, call when ready

## 2014-04-18 ENCOUNTER — Ambulatory Visit (HOSPITAL_COMMUNITY)
Admission: RE | Admit: 2014-04-18 | Discharge: 2014-04-18 | Disposition: A | Discharge status: Home / Self Care | Payer: 59 | Source: Ambulatory Visit | Attending: Cardiology | Admitting: Cardiology

## 2014-04-18 DIAGNOSIS — I519 Heart disease, unspecified: Secondary | ICD-10-CM | POA: Insufficient documentation

## 2014-04-18 DIAGNOSIS — R000 Tachycardia, unspecified: Secondary | ICD-10-CM | POA: Insufficient documentation

## 2014-04-18 DIAGNOSIS — G7111 Myotonic muscular dystrophy: Secondary | ICD-10-CM | POA: Insufficient documentation

## 2014-04-18 NOTE — Telephone Encounter (Signed)
Left detailed message on cell VM to advise pt of Rx's at front desk for pick up and to schedule f/u in June.  Okay per DPR.

## 2014-04-18 NOTE — Progress Notes (Signed)
2D Echo Performed 04/18/2014    [name], RCS  

## 2014-04-21 ENCOUNTER — Telehealth (HOSPITAL_COMMUNITY): Payer: Self-pay | Admitting: *Deleted

## 2014-06-20 ENCOUNTER — Ambulatory Visit (INDEPENDENT_AMBULATORY_CARE_PROVIDER_SITE_OTHER): Payer: 59 | Admitting: Family Medicine

## 2014-06-20 ENCOUNTER — Encounter: Payer: Self-pay | Admitting: Family Medicine

## 2014-06-20 VITALS — BP 123/81 | HR 107 | Temp 98.4°F | Ht 67.0 in | Wt 201.0 lb

## 2014-06-20 DIAGNOSIS — F329 Major depressive disorder, single episode, unspecified: Secondary | ICD-10-CM

## 2014-06-20 DIAGNOSIS — F32A Depression, unspecified: Secondary | ICD-10-CM

## 2014-06-20 DIAGNOSIS — G909 Disorder of the autonomic nervous system, unspecified: Secondary | ICD-10-CM

## 2014-06-20 DIAGNOSIS — F3289 Other specified depressive episodes: Secondary | ICD-10-CM

## 2014-06-20 DIAGNOSIS — E039 Hypothyroidism, unspecified: Secondary | ICD-10-CM

## 2014-06-20 DIAGNOSIS — F988 Other specified behavioral and emotional disorders with onset usually occurring in childhood and adolescence: Secondary | ICD-10-CM

## 2014-06-20 MED ORDER — AMPHETAMINE-DEXTROAMPHETAMINE 20 MG PO TABS: 20.0000 mg | ORAL_TABLET | Freq: Every day | ORAL | Status: DC

## 2014-06-20 MED ORDER — AMPHETAMINE-DEXTROAMPHETAMINE 20 MG PO TABS: ORAL_TABLET | ORAL | Status: DC

## 2014-06-20 NOTE — Addendum Note (Signed)
Addended by: Tammi Sou on: 06/20/2014 02:01 PM   Modules accepted: Orders

## 2014-06-20 NOTE — Progress Notes (Signed)
Pre visit review using our clinic review tool, if applicable. No additional management support is needed unless otherwise documented below in the visit note. 

## 2014-06-20 NOTE — Progress Notes (Signed)
OFFICE NOTE  06/20/2014  CC:  Chief Complaint  Patient presents with  . Medication Refill   HPI: Patient is a 53 y.o. Caucasian male who is here for routine f/u. Wants to switch stimulant back to adderall 20mg  tid b/c he felt better on this regimen (more energy, better focus/concentration/better mood) compared to current regimen of vyvanse 60mg  qd. Compliant with synthroid.  Pertinent PMH:  Past medical, surgical, social, and family history reviewed and no changes are noted since last office visit.  MEDS:  Outpatient Prescriptions Prior to Visit  Medication Sig Dispense Refill  . acetaminophen (TYLENOL) 500 MG tablet Take 1,000 mg by mouth every 6 (six) hours as needed.        . DULoxetine (CYMBALTA) 60 MG capsule TAKE 1 CAPSULE BY MOUTH EVERY MORNING  30 capsule  6  . famotidine (PEPCID AC) 10 MG chewable tablet Chew by mouth.      . levothyroxine (SYNTHROID, LEVOTHROID) 150 MCG tablet Take 150 mcg by mouth daily before breakfast.      . lisdexamfetamine (VYVANSE) 60 MG capsule Take 1 capsule (60 mg total) by mouth every morning.  30 capsule  0  . traZODone (DESYREL) 100 MG tablet TAKE 1 TABLET (100 MG TOTAL) BY MOUTH AT BEDTIME AS NEEDED FOR SLEEP.  30 tablet  6  . promethazine (PHENERGAN) 12.5 MG tablet 1-2 tabs po q6h prn nausea30  30 tablet  1   No facility-administered medications prior to visit.    PE: Blood pressure 123/81, pulse 107, temperature 98.4 F (36.9 C), temperature source Temporal, height 5\' 7"  (1.702 m), weight 201 lb (91.173 kg), SpO2 97.00%. Wt Readings from Last 2 Encounters:  06/20/14 201 lb (91.173 kg)  04/09/14 197 lb (89.359 kg)    Gen: alert, oriented x 4, affect pleasant.  Lucid thinking and conversation noted. HEENT: PERRLA, EOMI.   Neck: no LAD, mass, or thyromegaly. CV: RRR, no m/r/g LUNGS: CTA bilat, nonlabored. NEURO: no tremor or tics noted on observation.  Coordination intact. CN 2-12 grossly intact bilaterally, strength 5/5 in all  extremeties.  No ataxia.   IMPRESSION AND PLAN:  Adult ADD with mood disorder: discussed risk of stimulants, esp regarding his tachycardia and his risk for arrhythmia from his cardiomyopathy assoc with his musc dystrophy. He expressed understanding and so we decided to go ahead and switch from vyvanse to adderall short acting 20mg  tid, #90, no RF. His wife is a Marine scientist and will keep a close eye on sx's/HR, etc. Continue cymbalta at 60mg  qd. For hypothyroidism, last TSH was normal 12/2013 and he has been compliant with med so we'll hold off on TSH recheck for another 6 mo.  An After Visit Summary was printed and given to the patient.  FOLLOW UP: 65mo

## 2014-08-19 ENCOUNTER — Other Ambulatory Visit: Payer: Self-pay | Admitting: Family Medicine

## 2014-08-19 NOTE — Telephone Encounter (Signed)
RF request for trazadone.  Last OV was 06/21/15.  Last Rx was 11/25/13 x 6 rfs.  Please advise.

## 2014-08-20 NOTE — Telephone Encounter (Signed)
Trazodone rx sent as per pt request.

## 2014-09-08 ENCOUNTER — Ambulatory Visit (INDEPENDENT_AMBULATORY_CARE_PROVIDER_SITE_OTHER): Payer: 59 | Admitting: Family Medicine

## 2014-09-08 ENCOUNTER — Encounter: Payer: Self-pay | Admitting: Family Medicine

## 2014-09-08 VITALS — BP 109/71 | HR 86 | Temp 97.4°F | Resp 18 | Ht 67.0 in | Wt 198.0 lb

## 2014-09-08 DIAGNOSIS — F988 Other specified behavioral and emotional disorders with onset usually occurring in childhood and adolescence: Secondary | ICD-10-CM

## 2014-09-08 DIAGNOSIS — E039 Hypothyroidism, unspecified: Secondary | ICD-10-CM

## 2014-09-08 DIAGNOSIS — Z23 Encounter for immunization: Secondary | ICD-10-CM

## 2014-09-08 MED ORDER — AMPHETAMINE-DEXTROAMPHETAMINE 20 MG PO TABS: ORAL_TABLET | ORAL | Status: DC

## 2014-09-08 MED ORDER — LEVOTHYROXINE SODIUM 150 MCG PO TABS: 150.0000 ug | ORAL_TABLET | Freq: Every day | ORAL | Status: DC

## 2014-09-08 NOTE — Progress Notes (Signed)
OFFICE NOTE  09/08/2014  CC:  Chief Complaint  Patient presents with  . Follow-up     HPI: Patient is a 53 y.o. Caucasian male who is here for 3 mo f/u adult ADD with chronic daytime fatigue secondary to myotonic dystrophy.  We switched him from vyvanse back to adderall 20mg  tid last visit per his request b/c he felt like this helped him better.  Says this has helped a lot.  His wife has been keeping an eye on his heart rate and he reports it has been "fine".   Pertinent PMH:  Past medical, surgical, social, and family history reviewed and no changes are noted since last office visit.  MEDS:  Outpatient Prescriptions Prior to Visit  Medication Sig Dispense Refill  . acetaminophen (TYLENOL) 500 MG tablet Take 1,000 mg by mouth every 6 (six) hours as needed.       Marland Kitchen amphetamine-dextroamphetamine (ADDERALL) 20 MG tablet 1 tab po tid  90 tablet  0  . DULoxetine (CYMBALTA) 60 MG capsule TAKE 1 CAPSULE BY MOUTH EVERY MORNING  30 capsule  6  . famotidine (PEPCID AC) 10 MG chewable tablet Chew by mouth.      . levothyroxine (SYNTHROID, LEVOTHROID) 150 MCG tablet Take 150 mcg by mouth daily before breakfast.      . promethazine (PHENERGAN) 12.5 MG tablet 1-2 tabs po q6h prn nausea30  30 tablet  1  . traZODone (DESYREL) 100 MG tablet TAKE 1 TABLET BY MOUTH AT BEDTIME AS NEEDED FOR SLEEP.  30 tablet  12   No facility-administered medications prior to visit.    PE: Blood pressure 109/71, pulse 86, temperature 97.4 F (36.3 C), temperature source Temporal, resp. rate 18, height 5\' 7"  (1.702 m), weight 198 lb (89.812 kg), SpO2 96.00%. Gen: Alert, well appearing.  Patient is oriented to person, place, time, and situation. CV: RRR, no m/r/g.   LUNGS: CTA bilat, nonlabored resps, good aeration in all lung fields.   IMPRESSION AND PLAN:  1) Adult ADD + chronic fatigue secondary to myotonic dystrophy. Doing much better on adderall 20mg  tid. I printed rx's for adderall 20mg  tid, #90- today for  this month, October, and November 2015.  Appropriate fill on/after date was noted on each rx.   2) Hypothyroidism: recheck TSH 3 mo. Med RF today.  An After Visit Summary was printed and given to the patient.  Flu vaccine IM today.  FOLLOW UP: 3 mo

## 2014-09-08 NOTE — Progress Notes (Signed)
Pre visit review using our clinic review tool, if applicable. No additional management support is needed unless otherwise documented below in the visit note. 

## 2014-12-10 ENCOUNTER — Encounter: Payer: Self-pay | Admitting: Family Medicine

## 2014-12-10 ENCOUNTER — Ambulatory Visit (INDEPENDENT_AMBULATORY_CARE_PROVIDER_SITE_OTHER): Payer: 59 | Admitting: Family Medicine

## 2014-12-10 VITALS — BP 108/74 | HR 90 | Temp 97.2°F | Resp 18 | Ht 67.0 in | Wt 194.0 lb

## 2014-12-10 DIAGNOSIS — E890 Postprocedural hypothyroidism: Secondary | ICD-10-CM

## 2014-12-10 LAB — TSH: TSH: 8.48 u[IU]/mL — ABNORMAL HIGH (ref 0.35–4.50)

## 2014-12-10 MED ORDER — AMPHETAMINE-DEXTROAMPHETAMINE 20 MG PO TABS: ORAL_TABLET | ORAL | Status: DC

## 2014-12-10 MED ORDER — TRAZODONE HCL 100 MG PO TABS: ORAL_TABLET | ORAL | Status: DC

## 2014-12-10 MED ORDER — DULOXETINE HCL 60 MG PO CPEP: ORAL_CAPSULE | ORAL | Status: DC

## 2014-12-10 NOTE — Progress Notes (Signed)
OFFICE NOTE  12/10/2014  CC:  Chief Complaint  Patient presents with  . Follow-up   HPI: Patient is a 53 y.o. Caucasian male who is here for 3 mo f/u chronic fatigue, adult ADD, hypothyroidism with hx of thyroid cancer.   Says he has been trying on some days to cut back on adderall to twice a day lately b/c some days he feels a sensation of heat intolerance.  Denies palpitations or tachycardia or dizziness/hypotension. Says he needs adderall, cymbalta, and trazodone.  Pertinent PMH:  Past medical, surgical, social, and family history reviewed and no changes are noted since last office visit.  MEDS:  Outpatient Prescriptions Prior to Visit  Medication Sig Dispense Refill  . acetaminophen (TYLENOL) 500 MG tablet Take 1,000 mg by mouth every 6 (six) hours as needed.     Marland Kitchen amphetamine-dextroamphetamine (ADDERALL) 20 MG tablet 1 tab po tid 90 tablet 0  . DULoxetine (CYMBALTA) 60 MG capsule TAKE 1 CAPSULE BY MOUTH EVERY MORNING 30 capsule 6  . famotidine (PEPCID AC) 10 MG chewable tablet Chew by mouth.    . levothyroxine (SYNTHROID, LEVOTHROID) 150 MCG tablet Take 1 tablet (150 mcg total) by mouth daily before breakfast. 30 tablet 4  . promethazine (PHENERGAN) 12.5 MG tablet 1-2 tabs po q6h prn nausea30 30 tablet 1  . traZODone (DESYREL) 100 MG tablet TAKE 1 TABLET BY MOUTH AT BEDTIME AS NEEDED FOR SLEEP. 30 tablet 12   No facility-administered medications prior to visit.    PE: Blood pressure 108/74, pulse 90, temperature 97.2 F (36.2 C), temperature source Temporal, resp. rate 18, height 5\' 7"  (1.702 m), weight 194 lb (87.998 kg). Gen: Alert, well appearing.  Patient is oriented to person, place, time, and situation. RRR, no m/r/g  IMPRESSION AND PLAN:  1) Chronic fatigue/ADD related to myotonic dystrophy: stable. I printed rx's for adderall 20mg , 1 tid today, #90 for this month, Jan 2016, Feb 2016.  Appropriate fill on/after date was noted on each rx.  2) Hypothyroidism:  recheck TSH today  3) Anxiety/depression/insomnia: stable on cymbalta and trazodone. RF's given today.  An After Visit Summary was printed and given to the patient.  FOLLOW UP: 4 mo

## 2014-12-10 NOTE — Progress Notes (Signed)
Pre visit review using our clinic review tool, if applicable. No additional management support is needed unless otherwise documented below in the visit note. 

## 2015-02-24 ENCOUNTER — Encounter: Payer: Self-pay | Admitting: Family Medicine

## 2015-02-24 ENCOUNTER — Ambulatory Visit (INDEPENDENT_AMBULATORY_CARE_PROVIDER_SITE_OTHER): Payer: 59 | Admitting: Family Medicine

## 2015-02-24 VITALS — BP 125/82 | HR 101 | Temp 97.8°F | Resp 18 | Ht 67.0 in | Wt 192.0 lb

## 2015-02-24 DIAGNOSIS — F329 Major depressive disorder, single episode, unspecified: Secondary | ICD-10-CM

## 2015-02-24 DIAGNOSIS — F988 Other specified behavioral and emotional disorders with onset usually occurring in childhood and adolescence: Secondary | ICD-10-CM

## 2015-02-24 DIAGNOSIS — F909 Attention-deficit hyperactivity disorder, unspecified type: Secondary | ICD-10-CM

## 2015-02-24 DIAGNOSIS — E039 Hypothyroidism, unspecified: Secondary | ICD-10-CM

## 2015-02-24 DIAGNOSIS — F32A Depression, unspecified: Secondary | ICD-10-CM

## 2015-02-24 DIAGNOSIS — G7111 Myotonic muscular dystrophy: Secondary | ICD-10-CM

## 2015-02-24 MED ORDER — LEVOTHYROXINE SODIUM 150 MCG PO TABS: ORAL_TABLET | ORAL | Status: DC

## 2015-02-24 MED ORDER — AMPHETAMINE-DEXTROAMPHETAMINE 20 MG PO TABS: ORAL_TABLET | ORAL | Status: DC

## 2015-02-24 MED ORDER — FAMOTIDINE 10 MG PO CHEW: 10.0000 mg | CHEWABLE_TABLET | Freq: Two times a day (BID) | ORAL | Status: DC | PRN

## 2015-02-24 NOTE — Progress Notes (Signed)
OFFICE NOTE  02/24/2015  CC:  Chief Complaint  Patient presents with  . Medication Refill   HPI: Patient is a 54 y.o. Caucasian male who is here for f/u hypothyroidism. Last visit 2 mo ago his TSH was up slightly and I had him take 1 and 1/2 of his usual levothyroxine tabs one day a week, otherwise continue 1 per day on all other days of the week.  He was supposed to have followed up for lab visit at this time but it appears he made o/v instead.  Adult ADD, plus chronic fatigue and daytime somnolence due to Myotonic dystrophy has led to chronic need for stimulant and he has done well on adderall 20mg  tid dosing.  He confirms again today that this helps well with energy level, focus, motivation, and mood.  Doing well on trazodone hs for sleep, cymbalta daily for depression/anxiety.  He has plans to get in with ophthalmologist soon-screening regarding possible myotonic dystrophy-associated eye problems such as cataracts. Also overdue for f/u with cardiology--is going to arrange f/u soon with Dr. Gwenlyn Found. Is also due for routine f/u with his neuromuscular specialist at [facility].  Pertinent PMH:  Past surgical, social, and family history reviewed and no changes noted since last office visit.  MEDS:  Outpatient Prescriptions Prior to Visit  Medication Sig Dispense Refill  . acetaminophen (TYLENOL) 500 MG tablet Take 1,000 mg by mouth every 6 (six) hours as needed.     Marland Kitchen amphetamine-dextroamphetamine (ADDERALL) 20 MG tablet 1 tab po tid 90 tablet 0  . DULoxetine (CYMBALTA) 60 MG capsule TAKE 1 CAPSULE BY MOUTH EVERY MORNING 30 capsule 12  . famotidine (PEPCID AC) 10 MG chewable tablet Chew by mouth.    . levothyroxine (SYNTHROID, LEVOTHROID) 150 MCG tablet Take 1 tablet (150 mcg total) by mouth daily before breakfast. 30 tablet 4  . traZODone (DESYREL) 100 MG tablet TAKE 1 TABLET BY MOUTH AT BEDTIME AS NEEDED FOR SLEEP. 30 tablet 12  . promethazine (PHENERGAN) 12.5 MG tablet 1-2 tabs po q6h prn  nausea30 (Patient not taking: Reported on 02/24/2015) 30 tablet 1   No facility-administered medications prior to visit.    PE: Blood pressure 125/82, pulse 101, temperature 97.8 F (36.6 C), temperature source Temporal, resp. rate 18, height 5\' 7"  (1.702 m), weight 192 lb (87.091 kg), SpO2 98 %. Wt Readings from Last 2 Encounters:  02/24/15 192 lb (87.091 kg)  12/10/14 194 lb (87.998 kg)    Gen: alert, oriented x 4, affect pleasant.  Lucid thinking and conversation noted. HEENT: PERRLA, EOMI.   Neck: no LAD, mass, or thyromegaly. CV: RRR, no m/r/g LUNGS: CTA bilat, nonlabored. NEURO: no tremor or tics noted on observation.  Coordination intact. CN 2-12 grossly intact bilaterally, strength 5/5 in all extremeties.  No ataxia.   LAB:  Lab Results  Component Value Date   TSH 8.48* 12/10/2014   IMPRESSION AND PLAN:  1) Hypothyroidism; The current medical regimen is effective;  continue present plan and medications.  2) Adult ADD/chronic fatigue/daytime somnolence: The current medical regimen is effective;  continue present plan and medications. I printed rx's for adderall 20mg  tid, #90 today for March, April, and May 2016.  Appropriate fill on/after date was noted on each rx.  3) Anxiety/depression + insomnia: The current medical regimen is effective;  continue present plan and medications. RF'd meds today.  4) Myotonic dystrophy: he will be going to routine neuromuscular specialist f/u soon, as well as cardiologist and ophthalmologist f/u soon.  An  After Visit Summary was printed and given to the patient.  FOLLOW UP: 4 mo  ADDENDUM: unable to draw blood here today.  He'll return after attempting aggressive hydration.--PM

## 2015-02-24 NOTE — Progress Notes (Signed)
Pre visit review using our clinic review tool, if applicable. No additional management support is needed unless otherwise documented below in the visit note. 

## 2015-06-05 ENCOUNTER — Ambulatory Visit (INDEPENDENT_AMBULATORY_CARE_PROVIDER_SITE_OTHER): Payer: 59 | Admitting: Family Medicine

## 2015-06-05 ENCOUNTER — Encounter: Payer: Self-pay | Admitting: Family Medicine

## 2015-06-05 VITALS — BP 112/64 | HR 93 | Temp 98.4°F | Resp 16 | Wt 196.0 lb

## 2015-06-05 DIAGNOSIS — E039 Hypothyroidism, unspecified: Secondary | ICD-10-CM

## 2015-06-05 DIAGNOSIS — F909 Attention-deficit hyperactivity disorder, unspecified type: Secondary | ICD-10-CM | POA: Diagnosis not present

## 2015-06-05 DIAGNOSIS — F988 Other specified behavioral and emotional disorders with onset usually occurring in childhood and adolescence: Secondary | ICD-10-CM

## 2015-06-05 DIAGNOSIS — G7111 Myotonic muscular dystrophy: Secondary | ICD-10-CM

## 2015-06-05 DIAGNOSIS — Z23 Encounter for immunization: Secondary | ICD-10-CM

## 2015-06-05 DIAGNOSIS — G909 Disorder of the autonomic nervous system, unspecified: Secondary | ICD-10-CM

## 2015-06-05 LAB — TSH: TSH: 1.7 u[IU]/mL (ref 0.35–4.50)

## 2015-06-05 LAB — BASIC METABOLIC PANEL
BUN: 13 mg/dL (ref 6–23)
CHLORIDE: 102 meq/L (ref 96–112)
CO2: 29 mEq/L (ref 19–32)
CREATININE: 0.88 mg/dL (ref 0.40–1.50)
Calcium: 9.8 mg/dL (ref 8.4–10.5)
GFR: 95.69 mL/min (ref >60.00)
GLUCOSE: 121 mg/dL — AB (ref 70–99)
Potassium: 3.8 mEq/L (ref 3.5–5.1)
SODIUM: 139 meq/L (ref 135–145)

## 2015-06-05 MED ORDER — AMPHETAMINE-DEXTROAMPHETAMINE 20 MG PO TABS: ORAL_TABLET | ORAL | Status: DC

## 2015-06-05 NOTE — Progress Notes (Signed)
Pre visit review using our clinic review tool, if applicable. No additional management support is needed unless otherwise documented below in the visit note. 

## 2015-06-05 NOTE — Addendum Note (Signed)
Addended by: Lanae Crumbly on: 06/05/2015 02:45 PM   Modules accepted: Orders

## 2015-06-05 NOTE — Progress Notes (Signed)
OFFICE NOTE  06/05/2015  CC:  Chief Complaint  Patient presents with  . Follow-up    F/U meds     HPI: Patient is a 54 y.o. Caucasian male who is here for 3 mo f/u adult ADD/chronic fatigue/daytime somnolence which adderall 20mg  tid has worked well for.  It continues to help with focus, energy level, motivation.  We were unable to get a recheck of his TSH last o/v and he was supposed to have returned for lab draw after aggressive rehydration.  He did not return for this test.  Myotonic dystrophy: has not yet arranged cardiology f/u with Dr. Gwenlyn Found but says he intends to when he gets back from his vacation. Also needs to set up ophthalmology appt to get exam to look for cataracts, which are common in people with myotonic dystrophy. Still needs to return to Dr. Tillman Abide for follow up (neuromuscular specialist): it has been more than a year since last f/u.  Autonomic dysfunction: hx of sinus tachycardia.  Says he occasionally feels a rapid heartbeat.  No chest pains, no SOB, no dizziness.    Pertinent PMH:  Past medical, surgical, social, and family history reviewed and no changes are noted since last office visit.  MEDS:  Outpatient Prescriptions Prior to Visit  Medication Sig Dispense Refill  . acetaminophen (TYLENOL) 500 MG tablet Take 1,000 mg by mouth every 6 (six) hours as needed.     Marland Kitchen amphetamine-dextroamphetamine (ADDERALL) 20 MG tablet 1 tab po tid 90 tablet 0  . DULoxetine (CYMBALTA) 60 MG capsule TAKE 1 CAPSULE BY MOUTH EVERY MORNING 30 capsule 12  . famotidine (PEPCID AC) 10 MG chewable tablet Chew 1 tablet (10 mg total) by mouth 2 (two) times daily as needed for heartburn. 60 tablet 12  . levothyroxine (SYNTHROID, LEVOTHROID) 150 MCG tablet 1 tab po qd Monday through Saturday, and 1 and 1/2 tabs po on Sundays 32 tablet 3  . traZODone (DESYREL) 100 MG tablet TAKE 1 TABLET BY MOUTH AT BEDTIME AS NEEDED FOR SLEEP. 30 tablet 12   No facility-administered medications prior  to visit.    PE: Blood pressure 112/64, pulse 93, temperature 98.4 F (36.9 C), temperature source Oral, resp. rate 16, weight 196 lb (88.905 kg), SpO2 94 %. LZJ:QBHA: no injection, icteris, swelling, or exudate.  EOMI, PERRLA. Mouth: lips without lesion/swelling.  Oral mucosa pink and moist. Oropharynx without erythema, exudate, or swelling.  Neck - No masses or thyromegaly or limitation in range of motion, carotids 2+, without bruit. CV: RRR, no m/r/g.   LUNGS: CTA bilat, nonlabored resps, good aeration in all lung fields. EXT: no clubbing, cyanosis, or edema.   LAB:  Lab Results  Component Value Date   TSH 8.48* 12/10/2014     Chemistry      Component Value Date/Time   NA 141 02/20/2013 1604   K 4.0 02/20/2013 1604   CL 103 02/20/2013 1604   CO2 32 02/20/2013 1604   BUN 13 02/20/2013 1604   CREATININE 0.95 02/20/2013 1604   CREATININE 0.9 08/15/2012 1038      Component Value Date/Time   CALCIUM 10.0 02/20/2013 1604   ALKPHOS 50 08/15/2012 1038   AST 28 08/15/2012 1038   ALT 31 08/15/2012 1038   BILITOT 0.7 08/15/2012 1038     Lab Results  Component Value Date   CHOL 242* 08/15/2012   HDL 73.00 08/15/2012   LDLDIRECT 147.1 08/15/2012   TRIG 125.0 08/15/2012   CHOLHDL 3 08/15/2012   Lab Results  Component Value Date   WBC 3.7* 02/20/2013   HGB 14.7 02/20/2013   HCT 43.3 02/20/2013   MCV 89.6 02/20/2013   PLT 206 02/20/2013    IMPRESSION AND PLAN:  1) Myotonic dystrophy: pt is behind with routine f/u with neuromuscular specialist, cardiologist, and ophthalmologist. Encouraged pt to make these f/u appt's. Gave pt pneumovax 23 IM today.  2) ADD, chronic fatigue, and excessive somnolence: continue adderall 20 mg tid. I printed rx's for adderall 20mg  tid, #90 today for this month, July 2016, and August 2016.Marland Kitchen  Appropriate fill on/after date was noted on each rx. UDS next f/u visit.  3) Hypothyroidism: last TSH up, is overdue for TSH follow up after dose  change.   TSH today.  4) Autonomic dysfunction: stable.  Needs to get routine f/u with his local cardiologist, Dr. Gwenlyn Found. Check BMET today.  An After Visit Summary was printed and given to the patient.  FOLLOW UP: 4 mo

## 2015-07-10 ENCOUNTER — Ambulatory Visit (INDEPENDENT_AMBULATORY_CARE_PROVIDER_SITE_OTHER): Payer: 59 | Admitting: Cardiovascular Disease

## 2015-07-10 ENCOUNTER — Encounter: Payer: Self-pay | Admitting: Cardiovascular Disease

## 2015-07-10 VITALS — BP 116/80 | HR 91 | Ht 66.0 in | Wt 201.4 lb

## 2015-07-10 DIAGNOSIS — R000 Tachycardia, unspecified: Secondary | ICD-10-CM

## 2015-07-10 DIAGNOSIS — G7111 Myotonic muscular dystrophy: Secondary | ICD-10-CM

## 2015-07-10 NOTE — Progress Notes (Signed)
07/10/2015 Hale Bogus   1961-07-17  [id number]  Primary Physician Tammi Sou, MD Primary Cardiologist: Lorretta Harp MD Renae Gloss   HPI:   [name] is a 54 year old moderately overweight married Caucasian male father of one child referred by Dr. Ruby Cola for cardiovascular evaluation and to be established in our practice.I last saw him in the office 04/09/14. He has a history of myotonic dystrophy. He is 3 children amongst 9 who have this diagnosis. This is manifested by tonic-clonic activity as well as proximal muscle wasting. His other problems include clinical depression for 15 years. He said thyroid cancer thyroidectomy. He also has obstructive sleep apnea currently not on CPAP. He has never had a heart attack or stroke. He denies chest pain or shortness of breath.His echo 12/2012 showed normal LV size and wall thickness but EF 45-50% and global LV hypokinesis was found. A follow-up 2-D echo performed 04/18/14 showed normal LV systolic function.   Current Outpatient Prescriptions  Medication Sig Dispense Refill  . acetaminophen (TYLENOL) 500 MG tablet Take 1,000 mg by mouth every 6 (six) hours as needed.     Marland Kitchen amphetamine-dextroamphetamine (ADDERALL) 20 MG tablet 1 tab po tid 90 tablet 0  . DULoxetine (CYMBALTA) 60 MG capsule TAKE 1 CAPSULE BY MOUTH EVERY MORNING 30 capsule 12  . famotidine (PEPCID AC) 10 MG chewable tablet Chew 1 tablet (10 mg total) by mouth 2 (two) times daily as needed for heartburn. 60 tablet 12  . levothyroxine (SYNTHROID, LEVOTHROID) 150 MCG tablet 1 tab po qd Monday through Saturday, and 1 and 1/2 tabs po on Sundays 32 tablet 3  . traZODone (DESYREL) 100 MG tablet TAKE 1 TABLET BY MOUTH AT BEDTIME AS NEEDED FOR SLEEP. 30 tablet 12   No current facility-administered medications for this visit.    No Known Allergies  History   Social History  . Marital Status: Married    Spouse Name: N/A  . Number of Children: N/A  .  Years of Education: N/A   Occupational History  . Not on file.   Social History Main Topics  . Smoking status: Never Smoker   . Smokeless tobacco: Never Used  . Alcohol Use: Yes     Comment: rare beer  . Drug Use: No  . Sexual Activity: Not on file   Other Topics Concern  . Not on file   Social History Narrative   Married, one son [name]).   Occupation: Market researcher, currently unemployed, working on appeal for medical disability as of 11/2011.  No tob or drugs.  Drinks alcohol socially.     Review of Systems: General: negative for chills, fever, night sweats or weight changes.  Cardiovascular: negative for chest pain, dyspnea on exertion, edema, orthopnea, palpitations, paroxysmal nocturnal dyspnea or shortness of breath Dermatological: negative for rash Respiratory: negative for cough or wheezing Urologic: negative for hematuria Abdominal: negative for nausea, vomiting, diarrhea, bright red blood per rectum, melena, or hematemesis Neurologic: negative for visual changes, syncope, or dizziness All other systems reviewed and are otherwise negative except as noted above.    Blood pressure 116/80, pulse 91, height 5\' 6"  (1.676 m), weight 201 lb 6.4 oz (91.354 kg).  General appearance: alert and no distress Neck: no adenopathy, no carotid bruit, no JVD, supple, symmetrical, trachea midline and thyroid not enlarged, symmetric, no tenderness/mass/nodules Lungs: clear to auscultation bilaterally Heart: regular rate and rhythm, S1, S2 normal, no murmur, click, rub or gallop Extremities: extremities normal, atraumatic, no  cyanosis or edema  EKG normal sinus rhythm at 91 with nonspecific ST and T-wave changes. I personally reviewed this EKG  ASSESSMENT AND PLAN:   MYOTONIC MUSCULAR DYSTROPHY History of myotonic dystrophy with 2-D echocardiogram performed 04/18/14 revealing normal LV systolic function without valvular abnormalities. The patient is  asymptomatic.      Lorretta Harp MD FACP,FACC,FAHA, [facility] 07/10/2015 11:21 AM

## 2015-07-10 NOTE — Assessment & Plan Note (Signed)
History of myotonic dystrophy with 2-D echocardiogram performed 04/18/14 revealing normal LV systolic function without valvular abnormalities. The patient is asymptomatic.

## 2015-07-10 NOTE — Patient Instructions (Signed)
Follow up with Dr Berry as needed.  

## 2015-07-14 ENCOUNTER — Other Ambulatory Visit: Payer: Self-pay | Admitting: Family Medicine

## 2015-07-14 NOTE — Telephone Encounter (Signed)
RF request for Levothyroxine LOV: 06/05/15 Next ov: None Last written: 02/24/15 #32 w/ 3RF TSH: 1.70 on 06/05/15

## 2015-11-18 ENCOUNTER — Ambulatory Visit (INDEPENDENT_AMBULATORY_CARE_PROVIDER_SITE_OTHER): Payer: 59 | Admitting: Family Medicine

## 2015-11-18 ENCOUNTER — Other Ambulatory Visit (INDEPENDENT_AMBULATORY_CARE_PROVIDER_SITE_OTHER): Payer: 59

## 2015-11-18 ENCOUNTER — Encounter: Payer: Self-pay | Admitting: Family Medicine

## 2015-11-18 VITALS — BP 117/83 | HR 83 | Temp 98.7°F | Resp 16 | Ht 66.0 in | Wt 196.0 lb

## 2015-11-18 DIAGNOSIS — E039 Hypothyroidism, unspecified: Secondary | ICD-10-CM

## 2015-11-18 DIAGNOSIS — G909 Disorder of the autonomic nervous system, unspecified: Secondary | ICD-10-CM | POA: Diagnosis not present

## 2015-11-18 DIAGNOSIS — F329 Major depressive disorder, single episode, unspecified: Secondary | ICD-10-CM | POA: Diagnosis not present

## 2015-11-18 DIAGNOSIS — F909 Attention-deficit hyperactivity disorder, unspecified type: Secondary | ICD-10-CM

## 2015-11-18 DIAGNOSIS — F32A Depression, unspecified: Secondary | ICD-10-CM

## 2015-11-18 DIAGNOSIS — Z85850 Personal history of malignant neoplasm of thyroid: Secondary | ICD-10-CM

## 2015-11-18 DIAGNOSIS — K760 Fatty (change of) liver, not elsewhere classified: Secondary | ICD-10-CM

## 2015-11-18 DIAGNOSIS — Z23 Encounter for immunization: Secondary | ICD-10-CM

## 2015-11-18 DIAGNOSIS — E785 Hyperlipidemia, unspecified: Secondary | ICD-10-CM

## 2015-11-18 DIAGNOSIS — F988 Other specified behavioral and emotional disorders with onset usually occurring in childhood and adolescence: Secondary | ICD-10-CM

## 2015-11-18 LAB — COMPREHENSIVE METABOLIC PANEL
ALT: 28 U/L (ref 0–53)
AST: 32 U/L (ref 0–37)
Albumin: 4.4 g/dL (ref 3.5–5.2)
Alkaline Phosphatase: 70 U/L (ref 39–117)
BUN: 16 mg/dL (ref 6–23)
CALCIUM: 10.6 mg/dL — AB (ref 8.4–10.5)
CHLORIDE: 104 meq/L (ref 96–112)
CO2: 32 meq/L (ref 19–32)
Creatinine, Ser: 1.03 mg/dL (ref 0.40–1.50)
GFR: 79.67 mL/min (ref >60.00)
GLUCOSE: 89 mg/dL (ref 70–99)
POTASSIUM: 4.4 meq/L (ref 3.5–5.1)
Sodium: 145 mEq/L (ref 135–145)
Total Bilirubin: 0.6 mg/dL (ref 0.2–1.2)
Total Protein: 7.5 g/dL (ref 6.0–8.3)

## 2015-11-18 LAB — LIPID PANEL
CHOL/HDL RATIO: 3
Cholesterol: 224 mg/dL — ABNORMAL HIGH (ref 0–200)
HDL: 80.2 mg/dL (ref >39.00)
LDL CALC: 127 mg/dL — AB (ref 0–99)
NONHDL: 143.83
Triglycerides: 84 mg/dL (ref 0.0–149.0)
VLDL: 16.8 mg/dL (ref 0.0–40.0)

## 2015-11-18 LAB — TSH: TSH: 8.63 u[IU]/mL — ABNORMAL HIGH (ref 0.35–4.50)

## 2015-11-18 MED ORDER — AMPHETAMINE-DEXTROAMPHETAMINE 20 MG PO TABS: ORAL_TABLET | ORAL | Status: DC

## 2015-11-18 MED ORDER — DULOXETINE HCL 60 MG PO CPEP: ORAL_CAPSULE | ORAL | Status: DC

## 2015-11-18 MED ORDER — TRAZODONE HCL 100 MG PO TABS
ORAL_TABLET | ORAL | Status: DC
Start: 2015-11-18 — End: 2016-05-02

## 2015-11-18 NOTE — Progress Notes (Signed)
Pre visit review using our clinic review tool, if applicable. No additional management support is needed unless otherwise documented below in the visit note. 

## 2015-11-18 NOTE — Progress Notes (Signed)
OFFICE VISIT  11/18/2015   CC:  Chief Complaint  Patient presents with  . Follow-up    Pt is fasting.    HPI:    Patient is a 54 y.o. Caucasian male who presents for 5 mo f/u hypothyroidism, adult ADD, depression. Feels like he's doing pretty good.  Describes some random episodes of orthostatic dizziness, rarely falls, no syncope.  Has hx of this--autonomic dysfunction assoc with his myotonic dystrophy is believed to be the cause. Saw Dr. Gwenlyn Found, cardiologist, 06/2015, no changes.  Says he has not followed up with the MD at [facility] that follows him for his hx of thyroid cancer in "quite a while".  He can't give me an approximate date. Last f/u note from his neuromuscular MD at [facility] in chart is from 12/2012.  Adderall helps his focus, attention, motivation, mood. Mood stable lately on cymbalta.  Says he takes his levothyroxine on empty stomach and separate from other pills qd.  Past Medical History  Diagnosis Date  . Myotonic dystrophy ([place]) Dx'd 1996    Progressive muscle weakness and swallowing dysfunction.  Initial dx by Dr. Jannifer Franklin, neurologist, but subsequent annual specialist f/u has been with Dr. Ladon Applebaum [facility].   . Depression   . History of thyroid cancer 2010    Medullary carcinoma of thyroid. Thyroidectomy 08/2009 (Dr. Aida Puffer at [facility]).  Needs periodic serum calcitonin monitoring.  Marland Kitchen GERD (gastroesophageal reflux disease)   . Erectile dysfunction   . Hypothyroidism   . Facial fracture ([place]) 06/2011    Nondisplaced bilateral LeFort I fracture: no surgery required. (ENT, Dr. Wilburn Cornelia)   . Dizziness 06/2011+    Noncontrast CT and noncontrast MRI both negative 06/2011 at [facility] ED visit.  . Nephrolithiasis   . S/P colonoscopy     normal around 2010 per pt's wife  . LVH (left ventricular hypertrophy)     by EKG criteria 06/2011.  Hx of heart murmur--Followed by cardiology at [facility]: EF's have been "stable" per cardio office note 12/29/10.  His echo 12/2012 showed normal LV  size and wall thickness but EF 45-50% and global LV hypokinesis was found.  . OSA (obstructive sleep apnea)     does not wear CPAP  . Constipation, chronic     Has caused irritative urinary/bladder symptoms in the past.  . Adult ADHD   . Elevated transaminase level     Liver biopsy revealed fatty liver  . Fatty liver disease, nonalcoholic   . Hyperlipidemia     Past Surgical History  Procedure Laterality Date  . Thyroidectomy  08/2009  . Extracorporeal shock wave lithotripsy    . Transthoracic echocardiogram  12/2012    Normal LV size and wall thickness, LV EF 45-50%, with global hypokinesis of LV.    Outpatient Prescriptions Prior to Visit  Medication Sig Dispense Refill  . acetaminophen (TYLENOL) 500 MG tablet Take 1,000 mg by mouth every 6 (six) hours as needed.     . famotidine (PEPCID AC) 10 MG chewable tablet Chew 1 tablet (10 mg total) by mouth 2 (two) times daily as needed for heartburn. 60 tablet 12  . levothyroxine (SYNTHROID, LEVOTHROID) 150 MCG tablet TAKE 1 TABLET BY MOUTH ONCE DAILY MONDAY THRU SATURDAY, AND 1 AND 1/2 TABLETS ON SUNDAY 32 tablet 6  . amphetamine-dextroamphetamine (ADDERALL) 20 MG tablet 1 tab po tid 90 tablet 0  . DULoxetine (CYMBALTA) 60 MG capsule TAKE 1 CAPSULE BY MOUTH EVERY MORNING 30 capsule 12  . traZODone (DESYREL) 100 MG tablet TAKE  1 TABLET BY MOUTH AT BEDTIME AS NEEDED FOR SLEEP. 30 tablet 12   No facility-administered medications prior to visit.    No Known Allergies  ROS As per HPI  PE: Blood pressure 117/83, pulse 83, temperature 98.7 F (37.1 C), temperature source Oral, resp. rate 16, height 5\' 6"  (1.676 m), weight 196 lb (88.905 kg), SpO2 99 %. Wt Readings from Last 2 Encounters:  11/18/15 196 lb (88.905 kg)  07/10/15 201 lb 6.4 oz (91.354 kg)    Gen: alert, oriented x 4, affect pleasant.  Lucid thinking and conversation noted. HEENT: PERRLA, EOMI.   Neck: no LAD, mass, or thyromegaly. CV: RRR, no m/r/g LUNGS: CTA bilat,  nonlabored. NEURO: no tremor or tics noted on observation.  Coordination intact. CN 2-12 grossly intact bilaterally, strength 5/5 in all extremeties.  No ataxia.   LABS:  Lab Results  Component Value Date   TSH 1.70 06/05/2015   Lab Results  Component Value Date   WBC 3.7* 02/20/2013   HGB 14.7 02/20/2013   HCT 43.3 02/20/2013   MCV 89.6 02/20/2013   PLT 206 02/20/2013   Lab Results  Component Value Date   CREATININE 0.88 06/05/2015   BUN 13 06/05/2015   NA 139 06/05/2015   K 3.8 06/05/2015   CL 102 06/05/2015   CO2 29 06/05/2015   Lab Results  Component Value Date   ALT 31 08/15/2012   AST 28 08/15/2012   ALKPHOS 50 08/15/2012   BILITOT 0.7 08/15/2012   Lab Results  Component Value Date   CHOL 242* 08/15/2012   Lab Results  Component Value Date   HDL 73.00 08/15/2012   No results found for: [facility] Lab Results  Component Value Date   TRIG 125.0 08/15/2012   Lab Results  Component Value Date   CHOLHDL 3 08/15/2012   Lab Results  Component Value Date   PSA 0.57 08/15/2012    IMPRESSION AND PLAN:  1) Adult ADHD: The current medical regimen is effective;  continue present plan and medications. I printed rx's for adderall 20mg  tid, #90 today for this month, Dec 2016, and Jan 2017.  Appropriate fill on/after date was noted on each rx.  2) Hypothyroidism, hx of thyroidectomy for medullary thyroid carcinoma: lost to f/u with his oncologist/endocrinologist at [facility]. He needs to get back in to see them for f/u but not clear why patient is not doing this. Will check serum calcitonin level as well as TSH.  3) Hyperlipidemia: not on statin, has hx of elevated LFTs/fatty liver. Recheck FLP today.  4) Depression: The current medical regimen is effective;  continue present plan and medications.  5) Myotonic dystrophy: stable.  Encouraged routine f/u with specialist at [facility].  An After Visit Summary was printed and given to the patient.  FOLLOW UP: Return in  about 4 months (around 03/17/2016) for routine chronic illness f/u.

## 2015-11-21 LAB — CALCITONIN: Calcitonin: <2 pg/mL (ref <=10)

## 2016-05-02 ENCOUNTER — Other Ambulatory Visit (INDEPENDENT_AMBULATORY_CARE_PROVIDER_SITE_OTHER): Payer: 59

## 2016-05-02 ENCOUNTER — Ambulatory Visit (INDEPENDENT_AMBULATORY_CARE_PROVIDER_SITE_OTHER): Payer: 59 | Admitting: Family Medicine

## 2016-05-02 ENCOUNTER — Encounter: Payer: Self-pay | Admitting: Family Medicine

## 2016-05-02 VITALS — BP 121/74 | HR 110 | Temp 97.8°F | Ht 66.0 in | Wt 208.0 lb

## 2016-05-02 DIAGNOSIS — Z85850 Personal history of malignant neoplasm of thyroid: Secondary | ICD-10-CM

## 2016-05-02 DIAGNOSIS — G909 Disorder of the autonomic nervous system, unspecified: Secondary | ICD-10-CM

## 2016-05-02 DIAGNOSIS — F329 Major depressive disorder, single episode, unspecified: Secondary | ICD-10-CM

## 2016-05-02 DIAGNOSIS — F900 Attention-deficit hyperactivity disorder, predominantly inattentive type: Secondary | ICD-10-CM

## 2016-05-02 DIAGNOSIS — E039 Hypothyroidism, unspecified: Secondary | ICD-10-CM

## 2016-05-02 DIAGNOSIS — F32A Depression, unspecified: Secondary | ICD-10-CM

## 2016-05-02 DIAGNOSIS — F909 Attention-deficit hyperactivity disorder, unspecified type: Secondary | ICD-10-CM

## 2016-05-02 MED ORDER — AMPHETAMINE-DEXTROAMPHETAMINE 20 MG PO TABS: ORAL_TABLET | ORAL | Status: DC

## 2016-05-02 MED ORDER — LEVOTHYROXINE SODIUM 150 MCG PO TABS: ORAL_TABLET | ORAL | Status: DC

## 2016-05-02 MED ORDER — DULOXETINE HCL 60 MG PO CPEP: ORAL_CAPSULE | ORAL | Status: DC

## 2016-05-02 MED ORDER — FAMOTIDINE 10 MG PO CHEW: 10.0000 mg | CHEWABLE_TABLET | Freq: Two times a day (BID) | ORAL | Status: DC | PRN

## 2016-05-02 MED ORDER — TRAZODONE HCL 100 MG PO TABS: ORAL_TABLET | ORAL | Status: DC

## 2016-05-02 NOTE — Progress Notes (Signed)
OFFICE VISIT  05/02/2016   CC:  Chief Complaint  Patient presents with  . Follow-up     HPI:    Patient is a 55 y.o. Caucasian male who presents for 6 mo f/u adult ADD, hypothyroidism, and depression. Last f/u visit his TSH was a bit elevated so I increased his levothyroxine dosing to 150 mcg qd and 1 and 1/2 of the 150 mcg tabs qd on wednesdays and sundays.  He was supposed to have returned for f/u 3 mo later but did not.  He has not followed up with his neuromuscular specialist OR his doctor who was following him after his medullary thyroid carcinoma.  Serum calcitonin check at last f/u here was undetectable.  Last cardiology f/u with Dr. Gwenlyn Found was 07/10/15: no new recommendations were made. He has only occasional orthostatic presyncope sx's when he rises too fast from the couch.  He sometimes falls but doesn't get hurt.  Autonomic dysfunction associated with his myotonic dystrophy is believed to be the cause of this.  Adderall helps his focus, motivation, and energy levels.  Still sounds like he does some sleeping in the daytime, though. Trazodone helping for sleep at night pretty consistently.   Mood and anxiety level are fair/stable on cymbalta 60mg  qd.     Past Medical History  Diagnosis Date  . Myotonic dystrophy ([place]) Dx'd 1996    Progressive muscle weakness and swallowing dysfunction.  Initial dx by Dr. Jannifer Franklin, neurologist, but subsequent annual specialist f/u has been with Dr. Ladon Applebaum [facility].   . Depression   . History of thyroid cancer 2010    Medullary carcinoma of thyroid. Thyroidectomy 08/2009 (Dr. Aida Puffer at [facility]).  Needs periodic serum calcitonin monitoring.  Marland Kitchen GERD (gastroesophageal reflux disease)   . Erectile dysfunction   . Hypothyroidism   . Facial fracture ([place]) 06/2011    Nondisplaced bilateral LeFort I fracture: no surgery required. (ENT, Dr. Wilburn Cornelia)   . Dizziness 06/2011+    Noncontrast CT and noncontrast MRI both negative 06/2011 at [facility] ED  visit.  . Nephrolithiasis   . S/P colonoscopy     normal around 2010 per pt's wife  . LVH (left ventricular hypertrophy)     by EKG criteria 06/2011.  Hx of heart murmur--Followed by cardiology at [facility]: EF's have been "stable" per cardio office note 12/29/10.  His echo 12/2012 showed normal LV size and wall thickness but EF 45-50% and global LV hypokinesis was found.  . OSA (obstructive sleep apnea)     does not wear CPAP  . Constipation, chronic     Has caused irritative urinary/bladder symptoms in the past.  . Adult ADHD   . Elevated transaminase level     Liver biopsy revealed fatty liver  . Fatty liver disease, nonalcoholic   . Hyperlipidemia     Past Surgical History  Procedure Laterality Date  . Thyroidectomy  08/2009  . Extracorporeal shock wave lithotripsy    . Transthoracic echocardiogram  12/2012    Normal LV size and wall thickness, LV EF 45-50%, with global hypokinesis of LV.    Outpatient Prescriptions Prior to Visit  Medication Sig Dispense Refill  . acetaminophen (TYLENOL) 500 MG tablet Take 1,000 mg by mouth every 6 (six) hours as needed.     Marland Kitchen amphetamine-dextroamphetamine (ADDERALL) 20 MG tablet 1 tab po tid 90 tablet 0  . DULoxetine (CYMBALTA) 60 MG capsule TAKE 1 CAPSULE BY MOUTH EVERY MORNING 30 capsule 12  . famotidine (PEPCID AC) 10 MG chewable  tablet Chew 1 tablet (10 mg total) by mouth 2 (two) times daily as needed for heartburn. 60 tablet 12  . levothyroxine (SYNTHROID, LEVOTHROID) 150 MCG tablet TAKE 1 TABLET BY MOUTH ONCE DAILY MONDAY THRU SATURDAY, AND 1 AND 1/2 TABLETS ON SUNDAY 32 tablet 6  . traZODone (DESYREL) 100 MG tablet TAKE 1 TABLET BY MOUTH AT BEDTIME AS NEEDED FOR SLEEP. 30 tablet 12   No facility-administered medications prior to visit.    No Known Allergies  ROS As per HPI  PE: Blood pressure 121/74, pulse 110, temperature 97.8 F (36.6 C), temperature source Oral, height 5\' 6"  (1.676 m), weight 208 lb (94.348 kg), SpO2 94 %. Gen:  Alert, well appearing.  Patient is oriented to person, place, time, and situation. CV: Regular, mild tachycardia, no m/r/g   LABS:  Lab Results  Component Value Date   TSH 8.63* 11/18/2015     Chemistry      Component Value Date/Time   NA 145 11/18/2015 1132   K 4.4 11/18/2015 1132   CL 104 11/18/2015 1132   CO2 32 11/18/2015 1132   BUN 16 11/18/2015 1132   CREATININE 1.03 11/18/2015 1132   CREATININE 0.95 02/20/2013 1604      Component Value Date/Time   CALCIUM 10.6* 11/18/2015 1132   ALKPHOS 70 11/18/2015 1132   AST 32 11/18/2015 1132   ALT 28 11/18/2015 1132   BILITOT 0.6 11/18/2015 1132     Lab Results  Component Value Date   CHOL 224* 11/18/2015   HDL 80.20 11/18/2015   LDLCALC 127* 11/18/2015   LDLDIRECT 147.1 08/15/2012   TRIG 84.0 11/18/2015   CHOLHDL 3 11/18/2015   IMPRESSION AND PLAN:  1) Hypothyroidism:  Recheck TSH today to see if our adjustment 6 mo ago helped.  2) Adult ADD + chronic daytime somnolence: adderall 20mg  tid helping. I printed rx's for adderall 20mg , 1 tid, #90 today for this month, June, and July 2017.  Appropriate fill on/after date was noted on each rx.  3) Depression: The current medical regimen is effective;  continue present plan and medications.  4) Autonomic dysfunction secondary to myotonic dystrophy.  He is largely asymptomatic. I encouraged him once again to make a f/u appt with Dr. Tillman Abide, his neuromuscular specialist at [facility].  5) Hx of medullary thyroid carcinoma:  I also encouraged him to make f/u with Dr. Conley Canal at [facility] for his history of medullary thyroid carcinoma. Serum calcitonin check 6 mo ago was undetectable.    An After Visit Summary was printed and given to the patient.  FOLLOW UP: Return in about 3 months (around 08/02/2016) for routine chronic illness f/u.  Signed:  Crissie Sickles, MD           05/02/2016

## 2016-05-02 NOTE — Addendum Note (Signed)
Addended by: Milta Deiters on: 05/02/2016 03:40 PM   Modules accepted: Orders

## 2016-05-02 NOTE — Progress Notes (Signed)
Pre visit review using our clinic review tool, if applicable. No additional management support is needed unless otherwise documented below in the visit note. 

## 2016-05-03 LAB — TSH: TSH: 11.64 u[IU]/mL — AB (ref 0.35–4.50)

## 2016-08-02 ENCOUNTER — Ambulatory Visit: Payer: Medicare Other | Admitting: Family Medicine

## 2016-08-06 DIAGNOSIS — H524 Presbyopia: Secondary | ICD-10-CM | POA: Diagnosis not present

## 2016-08-10 ENCOUNTER — Ambulatory Visit (INDEPENDENT_AMBULATORY_CARE_PROVIDER_SITE_OTHER): Payer: 59 | Admitting: Family Medicine

## 2016-08-10 ENCOUNTER — Encounter: Payer: Self-pay | Admitting: Family Medicine

## 2016-08-10 VITALS — BP 130/77 | HR 102 | Temp 98.2°F | Resp 16 | Ht 66.0 in | Wt 211.5 lb

## 2016-08-10 DIAGNOSIS — E039 Hypothyroidism, unspecified: Secondary | ICD-10-CM | POA: Diagnosis not present

## 2016-08-10 DIAGNOSIS — F909 Attention-deficit hyperactivity disorder, unspecified type: Secondary | ICD-10-CM

## 2016-08-10 DIAGNOSIS — F32A Depression, unspecified: Secondary | ICD-10-CM

## 2016-08-10 DIAGNOSIS — G7111 Myotonic muscular dystrophy: Secondary | ICD-10-CM | POA: Diagnosis not present

## 2016-08-10 DIAGNOSIS — F900 Attention-deficit hyperactivity disorder, predominantly inattentive type: Secondary | ICD-10-CM

## 2016-08-10 DIAGNOSIS — F329 Major depressive disorder, single episode, unspecified: Secondary | ICD-10-CM | POA: Diagnosis not present

## 2016-08-10 MED ORDER — AMPHETAMINE-DEXTROAMPHETAMINE 20 MG PO TABS: ORAL_TABLET | ORAL | 0 refills | Status: DC

## 2016-08-10 NOTE — Progress Notes (Signed)
Pre visit review using our clinic review tool, if applicable. No additional management support is needed unless otherwise documented below in the visit note. 

## 2016-08-10 NOTE — Progress Notes (Signed)
OFFICE VISIT  08/10/2016   CC:  Chief Complaint  Patient presents with  . Follow-up    Pt is not fasting.      HPI:    Patient is a 54 y.o. Caucasian male who presents for 3 mo f/u adult ADD, hypothyroidism, and depression. TSH up last check so his levothyroxine dosing was adjusted to 150 mcg tab, 1.5 tabs m/w/f/sun and 1 tab T/th/Sat.  No f/u with any of his specialists since I last saw him.   Doing well on adderall 20mg  po tid.  It helps focus/attention/motivation.  Less frustration on this med. Mood seems stable on duloxetine.  ROS: occ brief palpitations.   Past Medical History:  Diagnosis Date  . Adult ADHD   . Constipation, chronic    Has caused irritative urinary/bladder symptoms in the past.  . Depression   . Dizziness 06/2011+   Noncontrast CT and noncontrast MRI both negative 06/2011 at [facility] ED visit.  . Elevated transaminase level    Liver biopsy revealed fatty liver  . Erectile dysfunction   . Facial fracture ([place]) 06/2011   Nondisplaced bilateral LeFort I fracture: no surgery required. (ENT, Dr. Wilburn Cornelia)   . Fatty liver disease, nonalcoholic   . GERD (gastroesophageal reflux disease)   . History of thyroid cancer 2010   Medullary carcinoma of thyroid. Thyroidectomy 08/2009 (Dr. Aida Puffer at [facility]).  Needs periodic serum calcitonin monitoring.  Marland Kitchen Hyperlipidemia   . Hypothyroidism   . LVH (left ventricular hypertrophy)    by EKG criteria 06/2011.  Hx of heart murmur--Followed by cardiology at [facility]: EF's have been "stable" per cardio office note 12/29/10.  His echo 12/2012 showed normal LV size and wall thickness but EF 45-50% and global LV hypokinesis was found.  . 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  Initial dx by Dr. Jannifer Franklin, neurologist, but subsequent annual specialist f/u has been with Dr. Ladon Applebaum [facility].   . Nephrolithiasis   . OSA (obstructive sleep apnea)    does not wear CPAP  . S/P  colonoscopy    normal around 2010 per pt's wife    Past Surgical History:  Procedure Laterality Date  . EXTRACORPOREAL SHOCK WAVE LITHOTRIPSY    . THYROIDECTOMY  08/2009  . TRANSTHORACIC ECHOCARDIOGRAM  12/2012   Normal LV size and wall thickness, LV EF 45-50%, with global hypokinesis of LV.    Outpatient Medications Prior to Visit  Medication Sig Dispense Refill  . acetaminophen (TYLENOL) 500 MG tablet Take 1,000 mg by mouth every 6 (six) hours as needed.     . DULoxetine (CYMBALTA) 60 MG capsule TAKE 1 CAPSULE BY MOUTH EVERY MORNING 30 capsule 12  . famotidine (PEPCID AC) 10 MG chewable tablet Chew 1 tablet (10 mg total) by mouth 2 (two) times daily as needed for heartburn. 60 tablet 12  . levothyroxine (SYNTHROID, LEVOTHROID) 150 MCG tablet 1 tab po qd except take 1 and 1/2 tabs po qd on wednesdays and Sundays (Patient taking differently: Takes 1.5 tablet on M, W, F, and Sun and 1 tablet on T, Th, and Sat) 34 tablet 6  . traZODone (DESYREL) 100 MG tablet TAKE 1 TABLET BY MOUTH AT BEDTIME AS NEEDED FOR SLEEP. 30 tablet 12  . amphetamine-dextroamphetamine (ADDERALL) 20 MG tablet 1 tab po tid 90 tablet 0   No facility-administered medications prior to visit.     No Known Allergies  ROS As per HPI  PE: Blood pressure 130/77, pulse Marland Kitchen)  102, temperature 98.2 F (36.8 C), temperature source Oral, resp. rate 16, height 5\' 6"  (1.676 m), weight 211 lb 8 oz (95.9 kg), SpO2 96 %. Wt Readings from Last 2 Encounters:  08/10/16 211 lb 8 oz (95.9 kg)  05/02/16 208 lb (94.3 kg)    Gen: alert, oriented x 4, affect pleasant.  Lucid thinking and conversation noted. HEENT: PERRLA, EOMI.   Neck: no LAD, mass, or thyromegaly. CV: RRR, no m/r/g LUNGS: CTA bilat, nonlabored. NEURO: no tremor or tics noted on observation.  Coordination intact. CN 2-12 grossly intact bilaterally, strength 5-/5 in all extremeties.     LABS:  Lab Results  Component Value Date   TSH 11.64 (H) 05/02/2016     IMPRESSION AND PLAN:  1) Hypothyroidism: have been adjusting levothyroxine dose.  Recheck TSH today.  2) Adult ADHD; The current medical regimen is effective;  continue present plan and medications. I printed rx's for adderall 20mg  tid, #90 today for this month, Sept 2017, and Oct 2017.  Appropriate fill on/after date was noted on each rx.  3) Depression: The current medical regimen is effective;  continue present plan and medications.  4) Myotonic muscular dystrophy: encouraged pt to f/u with his cardiologist---he is overdue for annual follow up. Pt is also overdue for f/u with his neuromuscular specialist, Dr. Tillman Abide.  An After Visit Summary was printed and given to the patient.  FOLLOW UP: Return in about 3 months (around 11/10/2016) for routine chronic illness f/u.  Signed:  Crissie Sickles, MD           08/10/2016

## 2016-08-10 NOTE — Addendum Note (Signed)
Addended by: Onalee Hua on: 08/10/2016 04:04 PM   Modules accepted: Orders

## 2016-08-11 ENCOUNTER — Other Ambulatory Visit (INDEPENDENT_AMBULATORY_CARE_PROVIDER_SITE_OTHER): Payer: 59

## 2016-08-11 ENCOUNTER — Other Ambulatory Visit: Payer: Self-pay | Admitting: Family Medicine

## 2016-08-11 DIAGNOSIS — E039 Hypothyroidism, unspecified: Secondary | ICD-10-CM | POA: Diagnosis not present

## 2016-08-11 LAB — TSH: TSH: 11.65 u[IU]/mL — AB (ref 0.35–4.50)

## 2016-08-11 MED ORDER — LEVOTHYROXINE SODIUM 200 MCG PO TABS: 200.0000 ug | ORAL_TABLET | Freq: Every day | ORAL | 3 refills | Status: DC

## 2016-10-11 ENCOUNTER — Ambulatory Visit (INDEPENDENT_AMBULATORY_CARE_PROVIDER_SITE_OTHER): Payer: 59 | Admitting: Cardiovascular Disease

## 2016-10-11 ENCOUNTER — Encounter: Payer: Self-pay | Admitting: Cardiovascular Disease

## 2016-10-11 VITALS — BP 124/79 | HR 100 | Ht 66.0 in | Wt 204.6 lb

## 2016-10-11 DIAGNOSIS — Z0000 Encounter for general adult medical examination without abnormal findings: Secondary | ICD-10-CM | POA: Diagnosis not present

## 2016-10-11 NOTE — Progress Notes (Signed)
10/11/2016 [name]   May 22, 1961  [id number]  Primary Physician Robert Sou, MD Primary Cardiologist: Robert Harp MD [name]  HPI:  [name] is a 55 year old moderately overweight married Caucasian male father of one child referred by Dr. Ruby [name] for cardiovascular evaluation and to be established in our practice.I last saw him in the office 07/10/59. He has a history of myotonic dystrophy. He is 3 children amongst 9 who have this diagnosis. This is manifested by tonic-clonic activity as well as proximal muscle wasting. His other problems include clinical depression for 15 years. He said thyroid cancer thyroidectomy. He also has obstructive sleep apnea currently not on CPAP. He has never had a heart attack or stroke. He denies chest pain but does get occasional dyspnea on exertion. He was a fairly sedentary lifestyle.Marland KitchenHis echo 12/2012 showed normal LV size and wall thickness but EF 45-50% and global LV hypokinesis was found. A follow-up 2-D echo performed 04/18/14 showed normal LV systolic function.   Current Outpatient Prescriptions  Medication Sig Dispense Refill  . acetaminophen (TYLENOL) 500 MG tablet Take 1,000 mg by mouth every 6 (six) hours as needed.     Marland Kitchen amphetamine-dextroamphetamine (ADDERALL) 20 MG tablet 1 tab po tid 90 tablet 0  . DULoxetine (CYMBALTA) 60 MG capsule TAKE 1 CAPSULE BY MOUTH EVERY MORNING 30 capsule 12  . famotidine (PEPCID AC) 10 MG chewable tablet Chew 1 tablet (10 mg total) by mouth 2 (two) times daily as needed for heartburn. 60 tablet 12  . levothyroxine (SYNTHROID, LEVOTHROID) 200 MCG tablet Take 1 tablet (200 mcg total) by mouth daily before breakfast. 30 tablet 3  . traZODone (DESYREL) 100 MG tablet TAKE 1 TABLET BY MOUTH AT BEDTIME AS NEEDED FOR SLEEP. 30 tablet 12   No current facility-administered medications for this visit.     No Known Allergies  Social History   Social History  . Marital status:  Married    Spouse name: N/A  . Number of children: N/A  . Years of education: N/A   Occupational History  . Not on file.   Social History Main Topics  . Smoking status: Never Smoker  . Smokeless tobacco: Never Used  . Alcohol use Yes     Comment: rare beer  . Drug use: No  . Sexual activity: Not on file   Other Topics Concern  . Not on file   Social History Narrative   Married, one son [name]).   Occupation: Market researcher, currently unemployed, working on appeal for medical disability as of 11/2011.  No tob or drugs.  Drinks alcohol socially.     Review of Systems: General: negative for chills, fever, night sweats or weight changes.  Cardiovascular: negative for chest pain, dyspnea on exertion, edema, orthopnea, palpitations, paroxysmal nocturnal dyspnea or shortness of breath Dermatological: negative for rash Respiratory: negative for cough or wheezing Urologic: negative for hematuria Abdominal: negative for nausea, vomiting, diarrhea, bright red blood per rectum, melena, or hematemesis Neurologic: negative for visual changes, syncope, or dizziness All other systems reviewed and are otherwise negative except as noted above.    Blood pressure 124/79, pulse 100, height 5\' 6"  (1.676 m), weight 204 lb 9.6 oz (92.8 kg).  General appearance: alert and no distress Neck: no adenopathy, no carotid bruit, no JVD, supple, symmetrical, trachea midline and thyroid not enlarged, symmetric, no tenderness/mass/nodules Lungs: clear to auscultation bilaterally Heart: regular rate and rhythm, S1, S2 normal, no murmur, click, rub or  gallop Extremities: extremities normal, atraumatic, no cyanosis or edema  EKG sinus rhythm at 100 with nonspecific ST-T wave changes and evidence of LVH possible repulsive changes. I personally reviewed  this EKG  ASSESSMENT AND PLAN:   MYOTONIC MUSCULAR DYSTROPHY History of myotonic dystrophy with last 2-D echo performed 04/18/14 that showed normal LV  systolic function.      Robert Harp MD FACP,FACC,FAHA, [facility] 10/11/2016 4:15 PM

## 2016-10-11 NOTE — Assessment & Plan Note (Signed)
History of myotonic dystrophy with last 2-D echo performed 04/18/14 that showed normal LV systolic function.

## 2016-10-11 NOTE — Patient Instructions (Signed)
Medication Instructions:  NO CHANGES.   Follow-Up: Your physician wants you to follow-up in: 12 MONTHS WITH DR BERRY.  You will receive a reminder letter in the mail two months in advance. If you don't receive a letter, please call our office to schedule the follow-up appointment.   If you need a refill on your cardiac medications before your next appointment, please call your pharmacy.   

## 2016-11-10 ENCOUNTER — Ambulatory Visit (INDEPENDENT_AMBULATORY_CARE_PROVIDER_SITE_OTHER): Payer: 59 | Admitting: Family Medicine

## 2016-11-10 ENCOUNTER — Encounter: Payer: Self-pay | Admitting: Family Medicine

## 2016-11-10 VITALS — BP 124/82 | HR 106 | Temp 96.3°F | Resp 16 | Wt 203.0 lb

## 2016-11-10 DIAGNOSIS — F909 Attention-deficit hyperactivity disorder, unspecified type: Secondary | ICD-10-CM

## 2016-11-10 DIAGNOSIS — K760 Fatty (change of) liver, not elsewhere classified: Secondary | ICD-10-CM | POA: Diagnosis not present

## 2016-11-10 DIAGNOSIS — Z85850 Personal history of malignant neoplasm of thyroid: Secondary | ICD-10-CM

## 2016-11-10 DIAGNOSIS — E890 Postprocedural hypothyroidism: Secondary | ICD-10-CM

## 2016-11-10 MED ORDER — AMPHETAMINE-DEXTROAMPHETAMINE 20 MG PO TABS: ORAL_TABLET | ORAL | 0 refills | Status: DC

## 2016-11-10 NOTE — Progress Notes (Signed)
OFFICE VISIT  11/10/2016   CC:  Chief Complaint  Patient presents with  . Follow-up    ADD, Hypothyroidism   HPI:    Patient is a 55 y.o. Caucasian male who presents for 3 mo f/u hypothyroidism, adult ADHD, NAFLD. I increased her dosing to 200 mcg once daily 3 mo ago.  Prior to this he had been on 225 mcg qd on 4 days a week, and 150 mcg the other 3 days a week. He notes no physical or mental difference since taking this dosing.  He takes this med on empty stomach before BF, without any other meds.  Taking adderall 20mg  tid every day.  Appetite still ok.   Mood has been "OK".  Takes cymbalta 60 mg qd.    He has hx of NAFLD by bx.  Denies having ever been on a statin.  Our last lipid check was about 1 yr ago.  Past Medical History:  Diagnosis Date  . Adult ADHD   . Constipation, chronic    Has caused irritative urinary/bladder symptoms in the past.  . Depression   . Dizziness 06/2011+   Noncontrast CT and noncontrast MRI both negative 06/2011 at [facility] ED visit.  . Elevated transaminase level    Liver biopsy revealed fatty liver  . Erectile dysfunction   . Facial fracture ([place]) 06/2011   Nondisplaced bilateral LeFort I fracture: no surgery required. (ENT, Dr. Wilburn Cornelia)   . Fatty liver disease, nonalcoholic   . GERD (gastroesophageal reflux disease)   . History of thyroid cancer 2010   Medullary carcinoma of thyroid. Thyroidectomy 08/2009 (Dr. Aida Puffer at [facility]).  Needs periodic serum calcitonin monitoring.  Marland Kitchen Hyperlipidemia   . Hypothyroidism   . LVH (left ventricular hypertrophy)    by EKG criteria 06/2011.  Hx of heart murmur--Followed by cardiology at [facility]: EF's have been "stable" per cardio office note 12/29/10.  His echo 12/2012 showed normal LV size and wall thickness but EF 45-50% and global LV hypokinesis was found.  . 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  Initial dx by Dr. Jannifer Franklin, neurologist, but subsequent  annual specialist f/u has been with Dr. Ladon Applebaum [facility].   . Nephrolithiasis   . OSA (obstructive sleep apnea)    does not wear CPAP  . S/P colonoscopy    normal around 2010 per pt's wife    Past Surgical History:  Procedure Laterality Date  . EXTRACORPOREAL SHOCK WAVE LITHOTRIPSY    . THYROIDECTOMY  08/2009   for medullary thyroid cancer (Dr. Conley Canal at [facility] follows him for this).  . TRANSTHORACIC ECHOCARDIOGRAM  12/2012   Normal LV size and wall thickness, LV EF 45-50%, with global hypokinesis of LV.    Outpatient Medications Prior to Visit  Medication Sig Dispense Refill  . acetaminophen (TYLENOL) 500 MG tablet Take 1,000 mg by mouth every 6 (six) hours as needed.     . DULoxetine (CYMBALTA) 60 MG capsule TAKE 1 CAPSULE BY MOUTH EVERY MORNING 30 capsule 12  . famotidine (PEPCID AC) 10 MG chewable tablet Chew 1 tablet (10 mg total) by mouth 2 (two) times daily as needed for heartburn. 60 tablet 12  . levothyroxine (SYNTHROID, LEVOTHROID) 200 MCG tablet Take 1 tablet (200 mcg total) by mouth daily before breakfast. 30 tablet 3  . traZODone (DESYREL) 100 MG tablet TAKE 1 TABLET BY MOUTH AT BEDTIME AS NEEDED FOR SLEEP. 30 tablet 12  . amphetamine-dextroamphetamine (ADDERALL) 20 MG tablet 1 tab  po tid 90 tablet 0   No facility-administered medications prior to visit.     No Known Allergies  ROS As per HPI  PE: Blood pressure 124/82, pulse (!) 106, temperature (!) 96.3 F (35.7 C), temperature source Temporal, resp. rate 16, weight 203 lb (92.1 kg), SpO2 95 %. Wt Readings from Last 2 Encounters:  11/10/16 203 lb (92.1 kg)  10/11/16 204 lb 9.6 oz (92.8 kg)    Gen: alert, oriented x 4, affect pleasant.  Lucid thinking and conversation noted. HEENT: PERRLA, EOMI.   Neck: no LAD, mass, or thyromegaly. CV: RRR, no m/r/g LUNGS: CTA bilat, nonlabored. NEURO: no tremor or tics noted on observation.  Coordination intact. CN 2-12 grossly intact bilaterally, strength 5/5 in all  extremeties.  No ataxia.   LABS:    Chemistry      Component Value Date/Time   NA 145 11/18/2015 1132   K 4.4 11/18/2015 1132   CL 104 11/18/2015 1132   CO2 32 11/18/2015 1132   BUN 16 11/18/2015 1132   CREATININE 1.03 11/18/2015 1132   CREATININE 0.95 02/20/2013 1604      Component Value Date/Time   CALCIUM 10.6 (H) 11/18/2015 1132   ALKPHOS 70 11/18/2015 1132   AST 32 11/18/2015 1132   ALT 28 11/18/2015 1132   BILITOT 0.6 11/18/2015 1132     Lab Results  Component Value Date   TSH 11.65 (H) 08/11/2016   Lab Results  Component Value Date   CHOL 224 (H) 11/18/2015   HDL 80.20 11/18/2015   LDLCALC 127 (H) 11/18/2015   LDLDIRECT 147.1 08/15/2012   TRIG 84.0 11/18/2015   CHOLHDL 3 11/18/2015   IMPRESSION AND PLAN:  1) Hypothyroidism: needs TSH recheck and we'll have him get this when he returns at his earliest convenience for fasting labs.  2) Adult ADHD: The current medical regimen is effective;  continue present plan and medications. I printed rx's for adderall 20mg , 1 tid, #90 today for the next 3 months.  Appropriate fill on/after date was noted on each rx.  3) NAFLD: needs CMET and FLP when fasting--at his earliest convenience.  4) Hx of medullary carcinoma of thyroid in remote past: he doesn't f/u with his specialist anymore. Will check serum calcitonin level for surveillance.  An After Visit Summary was printed and given to the patient.  FOLLOW UP: Return for fasting lab visit ASAP; o/v routine follow up 3-4 months..  Signed:  Crissie Sickles, MD           11/10/2016

## 2016-11-10 NOTE — Progress Notes (Signed)
Pre visit review using our clinic review tool, if applicable. No additional management support is needed unless otherwise documented below in the visit note. 

## 2016-12-27 ENCOUNTER — Telehealth: Payer: 59 | Admitting: Family

## 2016-12-27 DIAGNOSIS — R05 Cough: Secondary | ICD-10-CM

## 2016-12-27 DIAGNOSIS — J209 Acute bronchitis, unspecified: Secondary | ICD-10-CM

## 2016-12-27 DIAGNOSIS — R059 Cough, unspecified: Secondary | ICD-10-CM

## 2016-12-27 MED ORDER — AZITHROMYCIN 250 MG PO TABS: 250.0000 mg | ORAL_TABLET | Freq: Every day | ORAL | 0 refills | Status: DC

## 2016-12-27 MED ORDER — PREDNISONE 10 MG (21) PO TBPK: 10.0000 mg | ORAL_TABLET | Freq: Every day | ORAL | 0 refills | Status: DC

## 2016-12-27 NOTE — Progress Notes (Signed)
We are sorry that you are not feeling well.  Here is how we plan to help!  Based on what you have shared with me it looks like you have upper respiratory tract inflammation that has resulted in a significant cough.  Inflammation and infection in the upper respiratory tract is commonly called bronchitis and has four common causes:  Allergies, Viral Infections, Acid Reflux and Bacterial Infections.  Allergies, viruses and acid reflux are treated by controlling symptoms or eliminating the cause. An example might be a cough caused by taking certain blood pressure medications. You stop the cough by changing the medication. Another example might be a cough caused by acid reflux. Controlling the reflux helps control the cough.  Based on your presentation I believe you most likely have A cough due to bacteria.  When patients have a fever and a productive cough with a change in color or increased sputum production, we are concerned about bacterial bronchitis.  If left untreated it can progress to pneumonia.  If your symptoms do not improve with your treatment plan it is important that you contact your provider.   I have prescribed Azithromyin 250 mg: two tables now and then one tablet daily for 4 additonal days    In addition you may use A non-prescription cough medication called Robitussin DAC. Take 2 teaspoons every 8 hours or Delsym: take 2 teaspoons every 12 hours.  Sterapred 10 mg dosepak  USE OF BRONCHODILATOR ("RESCUE") INHALERS: There is a risk from using your bronchodilator too frequently.  The risk is that over-reliance on a medication which only relaxes the muscles surrounding the breathing tubes can reduce the effectiveness of medications prescribed to reduce swelling and congestion of the tubes themselves.  Although you feel brief relief from the bronchodilator inhaler, your asthma may actually be worsening with the tubes becoming more swollen and filled with mucus.  This can delay other crucial  treatments, such as oral steroid medications. If you need to use a bronchodilator inhaler daily, several times per day, you should discuss this with your provider.  There are probably better treatments that could be used to keep your asthma under control.     HOME CARE . Only take medications as instructed by your medical team. . Complete the entire course of an antibiotic. . Drink plenty of fluids and get plenty of rest. . Avoid close contacts especially the very young and the elderly . Cover your mouth if you cough or cough into your sleeve. . Always remember to wash your hands . A steam or ultrasonic humidifier can help congestion.   GET HELP RIGHT AWAY IF: . You develop worsening fever. . You become short of breath . You cough up blood. . Your symptoms persist after you have completed your treatment plan MAKE SURE YOU   Understand these instructions.  Will watch your condition.  Will get help right away if you are not doing well or get worse.  Your e-visit answers were reviewed by a board certified advanced clinical practitioner to complete your personal care plan.  Depending on the condition, your plan could have included both over the counter or prescription medications. If there is a problem please reply  once you have received a response from your provider. Your safety is important to [place].  If you have drug allergies check your prescription carefully.    You can use MyChart to ask questions about today's visit, request a non-urgent call back, or ask for a work or school excuse  for 24 hours related to this e-Visit. If it has been greater than 24 hours you will need to follow up with your provider, or enter a new e-Visit to address those concerns. You will get an e-mail in the next two days asking about your experience.  I hope that your e-visit has been valuable and will speed your recovery. Thank you for using e-visits.  

## 2017-02-09 ENCOUNTER — Other Ambulatory Visit (INDEPENDENT_AMBULATORY_CARE_PROVIDER_SITE_OTHER): Payer: 59

## 2017-02-09 DIAGNOSIS — K760 Fatty (change of) liver, not elsewhere classified: Secondary | ICD-10-CM

## 2017-02-09 DIAGNOSIS — E890 Postprocedural hypothyroidism: Secondary | ICD-10-CM

## 2017-02-09 DIAGNOSIS — Z85850 Personal history of malignant neoplasm of thyroid: Secondary | ICD-10-CM | POA: Diagnosis not present

## 2017-02-09 LAB — LIPID PANEL
Cholesterol: 231 mg/dL — ABNORMAL HIGH (ref 0–200)
HDL: 69.3 mg/dL (ref >39.00)
LDL CALC: 130 mg/dL — AB (ref 0–99)
NONHDL: 162.05
Total CHOL/HDL Ratio: 3
Triglycerides: 159 mg/dL — ABNORMAL HIGH (ref 0.0–149.0)
VLDL: 31.8 mg/dL (ref 0.0–40.0)

## 2017-02-09 LAB — COMPREHENSIVE METABOLIC PANEL
ALK PHOS: 72 U/L (ref 39–117)
ALT: 23 U/L (ref 0–53)
AST: 25 U/L (ref 0–37)
Albumin: 4.4 g/dL (ref 3.5–5.2)
BUN: 13 mg/dL (ref 6–23)
CHLORIDE: 102 meq/L (ref 96–112)
CO2: 32 mEq/L (ref 19–32)
Calcium: 10.1 mg/dL (ref 8.4–10.5)
Creatinine, Ser: 0.9 mg/dL (ref 0.40–1.50)
GFR: 92.67 mL/min (ref >60.00)
GLUCOSE: 86 mg/dL (ref 70–99)
SODIUM: 140 meq/L (ref 135–145)
TOTAL PROTEIN: 7.4 g/dL (ref 6.0–8.3)
Total Bilirubin: 0.4 mg/dL (ref 0.2–1.2)

## 2017-02-09 LAB — TSH: TSH: 4.06 u[IU]/mL (ref 0.35–4.50)

## 2017-02-10 ENCOUNTER — Other Ambulatory Visit: Payer: Self-pay | Admitting: Family Medicine

## 2017-02-12 LAB — CALCITONIN

## 2017-02-16 ENCOUNTER — Encounter: Payer: Self-pay | Admitting: Family Medicine

## 2017-02-24 ENCOUNTER — Ambulatory Visit (INDEPENDENT_AMBULATORY_CARE_PROVIDER_SITE_OTHER): Payer: 59 | Admitting: Family Medicine

## 2017-02-24 ENCOUNTER — Encounter: Payer: Self-pay | Admitting: Family Medicine

## 2017-02-24 VITALS — BP 127/79 | HR 82 | Temp 97.5°F | Resp 16 | Ht 66.0 in | Wt 204.0 lb

## 2017-02-24 DIAGNOSIS — F419 Anxiety disorder, unspecified: Principal | ICD-10-CM

## 2017-02-24 DIAGNOSIS — F909 Attention-deficit hyperactivity disorder, unspecified type: Secondary | ICD-10-CM | POA: Diagnosis not present

## 2017-02-24 DIAGNOSIS — F329 Major depressive disorder, single episode, unspecified: Secondary | ICD-10-CM

## 2017-02-24 DIAGNOSIS — F418 Other specified anxiety disorders: Secondary | ICD-10-CM | POA: Diagnosis not present

## 2017-02-24 DIAGNOSIS — G7111 Myotonic muscular dystrophy: Secondary | ICD-10-CM | POA: Diagnosis not present

## 2017-02-24 DIAGNOSIS — E039 Hypothyroidism, unspecified: Secondary | ICD-10-CM | POA: Diagnosis not present

## 2017-02-24 MED ORDER — ESCITALOPRAM OXALATE 10 MG PO TABS: 10.0000 mg | ORAL_TABLET | Freq: Every day | ORAL | 3 refills | Status: DC

## 2017-02-24 MED ORDER — AMPHETAMINE-DEXTROAMPHETAMINE 20 MG PO TABS: ORAL_TABLET | ORAL | 0 refills | Status: DC

## 2017-02-24 MED ORDER — AMPHETAMINE-DEXTROAMPHETAMINE 20 MG PO TABS
ORAL_TABLET | ORAL | 0 refills | Status: DC
Start: 2017-02-24 — End: 2017-02-24

## 2017-02-24 NOTE — Progress Notes (Signed)
OFFICE VISIT  02/24/2017   CC:  Chief Complaint  Patient presents with  . Follow-up    RCI, pt is not fasting.    HPI:    Patient is a 56 y.o. Caucasian male who has myotonic muscular dystrophy and presents for 4 mo f/u adult ADHD, hypothyroidism, hx of depression.   He is going to change cardiologist, currently considering who to switch to.  Takes levothyroxine on empty stomach, sometimes take it with his duloxetine.    Mood: pt is vague, denies depression yet says he doesn't feel like duloxetine is helping. Feels worried sometimes.  Has some sadness, anhedonia.   Has not been on any other antidepressant in the past.  Says he was rx'd wellbutrin xl in the past but he did not take it.  ADHD: Adderall helps a lot with his focus/concentration/motivation, helps him with excessive daytime sleepiness as well.    ROS: no palpitations, no dizziness, no CP, no SOB.  Past Medical History:  Diagnosis Date  . Adult ADHD   . Constipation, chronic    Has caused irritative urinary/bladder symptoms in the past.  . Depression   . Dizziness 06/2011+   Noncontrast CT and noncontrast MRI both negative 06/2011 at [facility] ED visit.  . Elevated transaminase level    Liver biopsy revealed fatty liver  . Erectile dysfunction   . Facial fracture ([place]) 06/2011   Nondisplaced bilateral LeFort I fracture: no surgery required. (ENT, Dr. Wilburn Cornelia)   . Fatty liver disease, nonalcoholic   . GERD (gastroesophageal reflux disease)   . History of thyroid cancer 2010   Medullary carcinoma of thyroid. Thyroidectomy 08/2009 (Dr. Aida Puffer at [facility]).  Needs periodic serum calcitonin monitoring.  Marland Kitchen Hyperlipidemia   . Hypothyroidism   . LVH (left ventricular hypertrophy)    by EKG criteria 06/2011.  Hx of heart murmur--Followed by cardiology at [facility]: EF's have been "stable" per cardio office note 12/29/10.  His echo 12/2012 showed normal LV size and wall thickness but EF 45-50% and global LV hypokinesis was found.  .  Myotonic dystrophy ([place]) Dx'd 1996   Progressive muscle weakness and swallowing dysfunction.  Initial dx by Dr. Jannifer Franklin, neurologist, but subsequent annual specialist f/u has been with Dr. Ladon Applebaum [facility].   . Nephrolithiasis   . OSA (obstructive sleep apnea)    does not wear CPAP  . S/P colonoscopy    normal around 2010 per pt's wife    Past Surgical History:  Procedure Laterality Date  . EXTRACORPOREAL SHOCK WAVE LITHOTRIPSY    . THYROIDECTOMY  08/2009   for medullary thyroid cancer (Dr. Conley Canal at [facility] follows him for this).  . TRANSTHORACIC ECHOCARDIOGRAM  12/2012   Normal LV size and wall thickness, LV EF 45-50%, with global hypokinesis of LV.    Outpatient Medications Prior to Visit  Medication Sig Dispense Refill  . acetaminophen (TYLENOL) 500 MG tablet Take 1,000 mg by mouth every 6 (six) hours as needed.     . famotidine (PEPCID AC) 10 MG chewable tablet Chew 1 tablet (10 mg total) by mouth 2 (two) times daily as needed for heartburn. 60 tablet 12  . levothyroxine (SYNTHROID, LEVOTHROID) 200 MCG tablet TAKE 1 TABLET BY MOUTH DAILY BEFORE BREAKFAST. 30 tablet 3  . traZODone (DESYREL) 100 MG tablet TAKE 1 TABLET BY MOUTH AT BEDTIME AS NEEDED FOR SLEEP. 30 tablet 12  . amphetamine-dextroamphetamine (ADDERALL) 20 MG tablet 1 tab po tid 90 tablet 0  . DULoxetine (CYMBALTA) 60 MG capsule TAKE  1 CAPSULE BY MOUTH EVERY MORNING 30 capsule 12  . azithromycin (ZITHROMAX Z-PAK) 250 MG tablet Take 1 tablet (250 mg total) by mouth daily. As directed (Patient not taking: Reported on 02/24/2017) 6 tablet 0  . predniSONE (STERAPRED UNI-PAK 21 TAB) 10 MG (21) TBPK tablet Take 1 tablet (10 mg total) by mouth daily. (Patient not taking: Reported on 02/24/2017) 21 tablet 0   No facility-administered medications prior to visit.     No Known Allergies  ROS As per HPI  PE: Blood pressure 127/79, pulse 82, temperature 97.5 F (36.4 C), temperature source Oral, resp. rate 16, height 5\' 6"  (1.676  m), weight 204 lb (92.5 kg), SpO2 98 %. Gen: Alert, well appearing.  Patient is oriented to person, place, time, and situation. AFFECT: flat as per his usual.  Lucid thought and speech. CV: RRR, no m/r/g.   LUNGS: CTA bilat, nonlabored resps, good aeration in all lung fields. EXT: no clubbing, cyanosis, or edema.    LABS:  Lab Results  Component Value Date   TSH 4.06 02/09/2017   Lab Results  Component Value Date   WBC 3.7 (L) 02/20/2013   HGB 14.7 02/20/2013   HCT 43.3 02/20/2013   MCV 89.6 02/20/2013   PLT 206 02/20/2013   Lab Results  Component Value Date   CREATININE 0.90 02/09/2017   BUN 13 02/09/2017   NA 140 02/09/2017   K 5.5 slight hemolysis (H) 02/09/2017   CL 102 02/09/2017   CO2 32 02/09/2017   Lab Results  Component Value Date   ALT 23 02/09/2017   AST 25 02/09/2017   ALKPHOS 72 02/09/2017   BILITOT 0.4 02/09/2017   Lab Results  Component Value Date   CHOL 231 (H) 02/09/2017   Lab Results  Component Value Date   HDL 69.30 02/09/2017   Lab Results  Component Value Date   LDLCALC 130 (H) 02/09/2017   Lab Results  Component Value Date   TRIG 159.0 (H) 02/09/2017   Lab Results  Component Value Date   CHOLHDL 3 02/09/2017   Lab Results  Component Value Date   PSA 0.57 08/15/2012   IMPRESSION AND PLAN:  1) Adult ADHD: The current medical regimen is effective;  continue present plan and medications. I printed rx's for adderall 20mg  tid, #90 today for this month, April, and May 2018.  Appropriate fill on/after date was noted on each rx.  2) Depression and anxiety: inadequate response to duloxetine. Will switch him over to generic lexapro 10mg  qd.  3) Hypothyroidism: The current medical regimen is effective;  continue present plan and medications. Recent TSH normal.  4) Myotonic muscular dystrophy: a condition that has a higher risk of dangerous cardiac arrhythmias and needs to be followed by a cardiologist.  Currently he is not satisfied  with his cardiologist, so he is in the process of finding a new one.  An After Visit Summary was printed and given to the patient.  FOLLOW UP: Return in about 4 months (around 06/26/2017) for routine chronic illness f/u.  Signed:  Crissie Sickles, MD           02/24/2017

## 2017-02-24 NOTE — Progress Notes (Signed)
Pre visit review using our clinic review tool, if applicable. No additional management support is needed unless otherwise documented below in the visit note. 

## 2017-04-26 DIAGNOSIS — W1830XA Fall on same level, unspecified, initial encounter: Secondary | ICD-10-CM | POA: Diagnosis not present

## 2017-04-26 DIAGNOSIS — G35 Multiple sclerosis: Secondary | ICD-10-CM | POA: Diagnosis not present

## 2017-04-26 DIAGNOSIS — S0990XA Unspecified injury of head, initial encounter: Secondary | ICD-10-CM | POA: Diagnosis not present

## 2017-04-26 DIAGNOSIS — S0083XA Contusion of other part of head, initial encounter: Secondary | ICD-10-CM | POA: Diagnosis not present

## 2017-04-26 DIAGNOSIS — R4182 Altered mental status, unspecified: Secondary | ICD-10-CM | POA: Diagnosis not present

## 2017-04-26 DIAGNOSIS — S0093XA Contusion of unspecified part of head, initial encounter: Secondary | ICD-10-CM | POA: Diagnosis not present

## 2017-04-27 DIAGNOSIS — G710 Muscular dystrophy: Secondary | ICD-10-CM | POA: Diagnosis not present

## 2017-04-27 DIAGNOSIS — J9601 Acute respiratory failure with hypoxia: Secondary | ICD-10-CM | POA: Diagnosis not present

## 2017-04-27 DIAGNOSIS — R112 Nausea with vomiting, unspecified: Secondary | ICD-10-CM | POA: Diagnosis not present

## 2017-04-27 DIAGNOSIS — R918 Other nonspecific abnormal finding of lung field: Secondary | ICD-10-CM | POA: Diagnosis not present

## 2017-04-27 DIAGNOSIS — A419 Sepsis, unspecified organism: Secondary | ICD-10-CM | POA: Diagnosis not present

## 2017-04-27 DIAGNOSIS — G7111 Myotonic muscular dystrophy: Secondary | ICD-10-CM | POA: Diagnosis not present

## 2017-04-27 DIAGNOSIS — S3690XA Unspecified injury of unspecified intra-abdominal organ, initial encounter: Secondary | ICD-10-CM | POA: Diagnosis not present

## 2017-04-27 DIAGNOSIS — F909 Attention-deficit hyperactivity disorder, unspecified type: Secondary | ICD-10-CM | POA: Diagnosis not present

## 2017-04-27 DIAGNOSIS — J181 Lobar pneumonia, unspecified organism: Secondary | ICD-10-CM | POA: Diagnosis not present

## 2017-04-27 DIAGNOSIS — F329 Major depressive disorder, single episode, unspecified: Secondary | ICD-10-CM | POA: Diagnosis not present

## 2017-04-27 DIAGNOSIS — I959 Hypotension, unspecified: Secondary | ICD-10-CM | POA: Diagnosis not present

## 2017-04-27 DIAGNOSIS — J189 Pneumonia, unspecified organism: Secondary | ICD-10-CM | POA: Diagnosis not present

## 2017-04-27 DIAGNOSIS — E039 Hypothyroidism, unspecified: Secondary | ICD-10-CM | POA: Diagnosis not present

## 2017-04-27 DIAGNOSIS — K219 Gastro-esophageal reflux disease without esophagitis: Secondary | ICD-10-CM | POA: Diagnosis not present

## 2017-04-27 DIAGNOSIS — R1111 Vomiting without nausea: Secondary | ICD-10-CM | POA: Diagnosis not present

## 2017-04-27 DIAGNOSIS — R9431 Abnormal electrocardiogram [ECG] [EKG]: Secondary | ICD-10-CM | POA: Diagnosis not present

## 2017-04-27 DIAGNOSIS — R51 Headache: Secondary | ICD-10-CM | POA: Diagnosis not present

## 2017-04-27 DIAGNOSIS — R1310 Dysphagia, unspecified: Secondary | ICD-10-CM | POA: Diagnosis not present

## 2017-05-02 ENCOUNTER — Ambulatory Visit (INDEPENDENT_AMBULATORY_CARE_PROVIDER_SITE_OTHER): Payer: 59 | Admitting: Family Medicine

## 2017-05-02 ENCOUNTER — Encounter: Payer: Self-pay | Admitting: Family Medicine

## 2017-05-02 VITALS — BP 100/60 | HR 83 | Temp 98.0°F | Resp 16 | Ht 66.0 in | Wt 212.8 lb

## 2017-05-02 DIAGNOSIS — G7111 Myotonic muscular dystrophy: Secondary | ICD-10-CM | POA: Diagnosis not present

## 2017-05-02 DIAGNOSIS — J180 Bronchopneumonia, unspecified organism: Secondary | ICD-10-CM

## 2017-05-02 DIAGNOSIS — S060X0D Concussion without loss of consciousness, subsequent encounter: Secondary | ICD-10-CM

## 2017-05-02 DIAGNOSIS — S0083XD Contusion of other part of head, subsequent encounter: Secondary | ICD-10-CM

## 2017-05-02 NOTE — Progress Notes (Signed)
Pre visit review using our clinic review tool, if applicable. No additional management support is needed unless otherwise documented below in the visit note. 

## 2017-05-02 NOTE — Progress Notes (Signed)
OFFICE VISIT  05/02/2017   CC:  Chief Complaint  Patient presents with  . Hospitalization Follow-up    seen at [facility] on 04/27/17 - 04/29/17, Dx with pneumonia    HPI:    Patient is a 56 y.o. Caucasian male who presents for hospital follow up. Last time I saw him in office for routine f/u was 01/2017 and all labs were stable, no changes were made.  Was admitted--5/2- 5/5, 2018 to [facility] in [place]--I have limited records from hospitalization at this time. Reason for admission; AMS s/p fall in which he hit his head (he stumbled and slipped and hit a footstool--head hit tile floor---wife says it looked like left eyelid and left side of mouth was droopy, pt also acting confused), Head CTs showed no acute problem (x 2),found to have pneumonia, d/c'd home on doxycycline and levaquin.  Sx's on admission were subjective f/c, some myoclonic sx's; dx with possible sepsis, IV abx initially, required some oxygen intermittently x 24h.  His mental status cleared except he had some short term memory issues initially.  Now back at baseline per pt and wife. He had no cough or SOB on admission. He denies SOB.  He got some cough while in hosp.  Concern was for aspiration pneumonia.  Speech therapy swallowing eval was normal per wife.  Outpt PT recommended.  CT head 04/26/17 neg for acute problem. CT chest w/out contrast: 04/27/17: Bilat perihilar and upper lobe predominant ground glass opacities most likely representing multifocal bronchopneumonia.  No consolidation or effusion.  Follow up after treatment recommended to document resolution.  Past Medical History:  Diagnosis Date  . Adult ADHD   . Constipation, chronic    Has caused irritative urinary/bladder symptoms in the past.  . Depression   . Dizziness 06/2011+   Noncontrast CT and noncontrast MRI both negative 06/2011 at [facility] ED visit.  . Elevated transaminase level    Liver biopsy revealed fatty liver  . Erectile dysfunction   .  Facial fracture ([place]) 06/2011   Nondisplaced bilateral LeFort I fracture: no surgery required. (ENT, Dr. Wilburn Cornelia)   . Fatty liver disease, nonalcoholic   . GERD (gastroesophageal reflux disease)   . History of thyroid cancer 2010   Medullary carcinoma of thyroid. Thyroidectomy 08/2009 (Dr. Aida Puffer at [facility]).  Needs periodic serum calcitonin monitoring.  Marland Kitchen Hyperlipidemia   . Hypothyroidism   . LVH (left ventricular hypertrophy)    by EKG criteria 06/2011.  Hx of heart murmur--Followed by cardiology at [facility]: EF's have been "stable" per cardio office note 12/29/10.  His echo 12/2012 showed normal LV size and wall thickness but EF 45-50% and global LV hypokinesis was found.  . Myotonic dystrophy ([place]) Dx'd 1996   Progressive muscle weakness and swallowing dysfunction.  Initial dx by Dr. Jannifer Franklin, neurologist, but subsequent annual specialist f/u has been with Dr. Ladon Applebaum [facility].   . Nephrolithiasis   . OSA (obstructive sleep apnea)    does not wear CPAP  . S/P colonoscopy    normal around 2010 per pt's wife    Past Surgical History:  Procedure Laterality Date  . EXTRACORPOREAL SHOCK WAVE LITHOTRIPSY    . THYROIDECTOMY  08/2009   for medullary thyroid cancer (Dr. Conley Canal at [facility] follows him for this).  . TRANSTHORACIC ECHOCARDIOGRAM  12/2012   Normal LV size and wall thickness, LV EF 45-50%, with global hypokinesis of LV.    Outpatient Medications Prior to Visit  Medication Sig Dispense Refill  .  acetaminophen (TYLENOL) 500 MG tablet Take 1,000 mg by mouth every 6 (six) hours as needed.     Marland Kitchen amphetamine-dextroamphetamine (ADDERALL) 20 MG tablet 1 tab po tid 90 tablet 0  . escitalopram (LEXAPRO) 10 MG tablet Take 1 tablet (10 mg total) by mouth daily. 30 tablet 3  . famotidine (PEPCID AC) 10 MG chewable tablet Chew 1 tablet (10 mg total) by mouth 2 (two) times daily as needed for heartburn. 60 tablet 12  . levothyroxine (SYNTHROID, LEVOTHROID) 200 MCG tablet TAKE 1 TABLET BY MOUTH  DAILY BEFORE BREAKFAST. 30 tablet 3  . traZODone (DESYREL) 100 MG tablet TAKE 1 TABLET BY MOUTH AT BEDTIME AS NEEDED FOR SLEEP. 30 tablet 12   No facility-administered medications prior to visit.     No Known Allergies  ROS As per HPI  PE: Blood pressure 100/60, pulse 83, temperature 98 F (36.7 C), temperature source Oral, resp. rate 16, height 5\' 6"  (1.676 m), weight 212 lb 12 oz (96.5 kg), SpO2 94 %. Gen: Alert, well appearing.  Patient is oriented to person, place, time, and situation. Head: no bruising or swelling. Affect: flat, as per his usual. QGB:EEFE: no injection, icteris, swelling, or exudate.  EOMI, PERRLA. Mouth: lips without lesion/swelling.  Oral mucosa pink and moist. Oropharynx without erythema, exudate, or swelling.  CV: RRR, no m/r/g.   LUNGS: CTA bilat, nonlabored resps, good aeration in all lung fields. ABD: soft, NT/ND EXT: no clubbing, cyanosis, or edema.  Neuro: CN 2-12 intact bilaterally, strength 5/5 in proximal and distal upper extremities and lower extremities bilaterally.   No tremor.   LABS:  Lab Results  Component Value Date   TSH 4.06 02/09/2017   Lab Results  Component Value Date   CHOL 231 (H) 02/09/2017   HDL 69.30 02/09/2017   LDLCALC 130 (H) 02/09/2017   LDLDIRECT 147.1 08/15/2012   TRIG 159.0 (H) 02/09/2017   CHOLHDL 3 02/09/2017     Chemistry      Component Value Date/Time   NA 140 02/09/2017 1101   K 5.5 slight hemolysis (H) 02/09/2017 1101   CL 102 02/09/2017 1101   CO2 32 02/09/2017 1101   BUN 13 02/09/2017 1101   CREATININE 0.90 02/09/2017 1101   CREATININE 0.95 02/20/2013 1604      Component Value Date/Time   CALCIUM 10.1 02/09/2017 1101   ALKPHOS 72 02/09/2017 1101   AST 25 02/09/2017 1101   ALT 23 02/09/2017 1101   BILITOT 0.4 02/09/2017 1101       IMPRESSION AND PLAN:  1) Hosp f/u for pneumonia, possibly aspiration pneumonia.  He is improved.  Had head contusion and has concussion from this but no sign of  acute abnormality on head CTs in hospital.  Back to baseline MS per wife. Continue levaquin and doxycycline. Outpatient PT ordered. F/u 1 week for clinical recheck. Will ask [facility] in [place] to send complete hosp records. CT chest w/out contrast 4 weeks as per radiologist's recommendations.  An After Visit Summary was printed and given to the patient.  FOLLOW UP: Return in about 7 days (around 05/09/2017) for f/u pneumonia.  Signed:  Crissie Sickles, MD           05/02/2017

## 2017-05-09 ENCOUNTER — Encounter: Payer: Self-pay | Admitting: Family Medicine

## 2017-05-09 ENCOUNTER — Ambulatory Visit (INDEPENDENT_AMBULATORY_CARE_PROVIDER_SITE_OTHER): Payer: 59 | Admitting: Family Medicine

## 2017-05-09 VITALS — BP 100/69 | HR 99 | Temp 97.8°F | Resp 16 | Ht 66.0 in | Wt 209.5 lb

## 2017-05-09 DIAGNOSIS — Z9181 History of falling: Secondary | ICD-10-CM

## 2017-05-09 DIAGNOSIS — G7111 Myotonic muscular dystrophy: Secondary | ICD-10-CM | POA: Diagnosis not present

## 2017-05-09 DIAGNOSIS — J690 Pneumonitis due to inhalation of food and vomit: Secondary | ICD-10-CM

## 2017-05-09 DIAGNOSIS — R9389 Abnormal findings on diagnostic imaging of other specified body structures: Secondary | ICD-10-CM

## 2017-05-09 DIAGNOSIS — R938 Abnormal findings on diagnostic imaging of other specified body structures: Secondary | ICD-10-CM | POA: Diagnosis not present

## 2017-05-09 DIAGNOSIS — R531 Weakness: Secondary | ICD-10-CM

## 2017-05-09 NOTE — Progress Notes (Addendum)
OFFICE VISIT  05/09/2017   CC:  Chief Complaint  Patient presents with  . Follow-up    Pneumonia   HPI:    Patient is a 56 y.o. Caucasian male who presents accompanied by his wife for 1 week f/u aspiration pneumonia and recent concussion. He was hospitalized and treated with doxycycline and levaquin.  Outpt PT ordered. Wife and I felt he was back to baseline mental status at that time but I wanted to recheck this and pulm status again today.  Doing better: just some coughing last night--no other times since last visit.  No fevers.  No SOB, no CP, no hemoptysis.  No sign/symptoms lately of aspiration. PT contacted him yesterday--this will be arranged soon.  Mentally and emotionally pt and wife feel like he is back at his baseline.  He has not had any persistent HA.  Just one last week.  No new vision or hearing complaints. No tingling or numbness.  No focal weakness.  Has his usual diffuse muscle aches and generalized weakness that is secondary to his myotonic dystrophy.  Past Medical History:  Diagnosis Date  . Adult ADHD   . Constipation, chronic    Has caused irritative urinary/bladder symptoms in the past.  . Depression   . Dizziness 06/2011+   Noncontrast CT and noncontrast MRI both negative 06/2011 at [facility] ED visit.  . Elevated transaminase level    Liver biopsy revealed fatty liver  . Erectile dysfunction   . Facial fracture ([place]) 06/2011   Nondisplaced bilateral LeFort I fracture: no surgery required. (ENT, Dr. Wilburn Cornelia)   . Fatty liver disease, nonalcoholic   . GERD (gastroesophageal reflux disease)   . History of thyroid cancer 2010   Medullary carcinoma of thyroid. Thyroidectomy 08/2009 (Dr. Aida Puffer at [facility]).  Needs periodic serum calcitonin monitoring.  Marland Kitchen Hyperlipidemia   . Hypothyroidism   . LVH (left ventricular hypertrophy)    by EKG criteria 06/2011.  Hx of heart murmur--Followed by cardiology at [facility] EF's have been "stable" per cardio office note 12/29/10.   His echo 12/2012 showed normal LV size and wall thickness but EF 45-50% and global LV hypokinesis was found.  . Myotonic dystrophy ([place]) Dx'd 1996   Progressive muscle weakness and swallowing dysfunction.  Initial dx by Dr. Jannifer Franklin, neurologist, but subsequent annual specialist f/u has been with Dr. Ladon Applebaum [facility].   . Nephrolithiasis   . OSA (obstructive sleep apnea)    does not wear CPAP  . S/P colonoscopy    normal around 2010 per pt's wife    Past Surgical History:  Procedure Laterality Date  . EXTRACORPOREAL SHOCK WAVE LITHOTRIPSY    . THYROIDECTOMY  08/2009   for medullary thyroid cancer ([facility] for [facility]).  . TRANSTHORACIC ECHOCARDIOGRAM  12/2012   Normal LV size and wall thickness, LV EF 45-50%, with global hypokinesis of LV.    Outpatient Medications Prior to Visit  Medication Sig Dispense Refill  . acetaminophen (TYLENOL) 500 MG tablet Take 1,000 mg by mouth every 6 (six) hours as needed.     Marland Kitchen amphetamine-dextroamphetamine (ADDERALL) 20 MG tablet 1 tab po tid 90 tablet 0  . escitalopram (LEXAPRO) 10 MG tablet Take 1 tablet (10 mg total) by mouth daily. 30 tablet 3  . famotidine (PEPCID AC) 10 MG chewable tablet Chew 1 tablet (10 mg total) by mouth 2 (two) times daily as needed for heartburn. 60 tablet 12  . levothyroxine (SYNTHROID, LEVOTHROID) 200 MCG tablet TAKE 1  TABLET BY MOUTH DAILY BEFORE BREAKFAST. 30 tablet 3  . Probiotic Product (PROBIOTIC PO) Take 1 capsule by mouth daily.    . traZODone (DESYREL) 100 MG tablet TAKE 1 TABLET BY MOUTH AT BEDTIME AS NEEDED FOR SLEEP. 30 tablet 12  . doxycycline (VIBRA-TABS) 100 MG tablet Take 1 tablet by mouth 2 (two) times daily.  0  . levofloxacin (LEVAQUIN) 750 MG tablet Take 1 tablet by mouth daily.  0   No facility-administered medications prior to visit.     No Known Allergies  ROS As per HPI  PE: Blood pressure 100/69, pulse 99, temperature 97.8 F (36.6 C), temperature source Oral, resp.  rate 16, height 5\' 6"  (1.676 m), weight 209 lb 8 oz (95 kg), SpO2 99 %. Gen: Alert, well appearing.  Patient is oriented to person, place, time, and situation. AFFECT: flat as per his usual.  Lucid thought and speech. CLE:XNTZ: no injection, icteris, swelling, or exudate.  EOMI, PERRLA. Mouth: lips without lesion/swelling.  Oral mucosa pink and moist. Oropharynx without erythema, exudate, or swelling.  CV: RRR, no m/r/g.   LUNGS: CTA bilat, nonlabored resps, good aeration in all lung fields. EXT: no clubbing, cyanosis, or edema.  Neuro: CN 2-12 intact bilaterally, strength 5/5 in proximal and distal upper extremities and lower extremities bilaterally.   No tremor.    No ataxia.  Upper extremity and lower extremity DTRs symmetric (patellar 0 bilat, achilles trace bilat.    LABS:    Chemistry      Component Value Date/Time   NA 140 02/09/2017 1101   K 5.5 slight hemolysis (H) 02/09/2017 1101   CL 102 02/09/2017 1101   CO2 32 02/09/2017 1101   BUN 13 02/09/2017 1101   CREATININE 0.90 02/09/2017 1101   CREATININE 0.95 02/20/2013 1604      Component Value Date/Time   CALCIUM 10.1 02/09/2017 1101   ALKPHOS 72 02/09/2017 1101   AST 25 02/09/2017 1101   ALT 23 02/09/2017 1101   BILITOT 0.4 02/09/2017 1101     Lab Results  Component Value Date   WBC 3.7 (L) 02/20/2013   HGB 14.7 02/20/2013   HCT 43.3 02/20/2013   MCV 89.6 02/20/2013   PLT 206 02/20/2013   Lab Results  Component Value Date   TSH 4.06 02/09/2017    IMPRESSION AND PLAN:  1) Aspiration pneumonia: he is greatly improved.  Essentially no more respiratory symptoms at all. CT chest w/out contrast 3 weeks as per radiologist's recommendations: ordered today. Still awaiting outside hosp records.  2) Fall, with MS changes subsequently: CT head at [facility] neg x 2.  Concussion sustained but he has no signs of post-concussion syndrome.  He is at baseline mental status, w/out any neurological deficits or  complaints. He is getting PT set up.  3) Myotonic dystrophy: I encouraged him today to make f/u appt with his neuromuscular specialist at [facility].    An After Visit Summary was printed and given to the patient.  FOLLOW UP: Return in about 3 months (around 08/09/2017) for routine chronic illness f/u.  Signed:  Crissie Sickles, MD           05/09/2017  ADDENDUM 05/12/17: Reviewed [facility] ED and hospitalization records. Nothing new to add to above information.  No repeat lab work needed.  Signed:  Crissie Sickles, MD           05/12/2017

## 2017-05-15 ENCOUNTER — Other Ambulatory Visit: Payer: Self-pay | Admitting: Family Medicine

## 2017-05-15 NOTE — Telephone Encounter (Signed)
[facility]  RF request for trazodone LOV: 02/24/17 Next ov: None Last written: 05/02/16 #30 w/ 12RF  Please advise. Thanks.

## 2017-05-18 ENCOUNTER — Ambulatory Visit (INDEPENDENT_AMBULATORY_CARE_PROVIDER_SITE_OTHER): Payer: 59 | Admitting: Physical Therapy

## 2017-05-18 DIAGNOSIS — M6281 Muscle weakness (generalized): Secondary | ICD-10-CM | POA: Diagnosis not present

## 2017-05-18 DIAGNOSIS — R2689 Other abnormalities of gait and mobility: Secondary | ICD-10-CM

## 2017-05-18 DIAGNOSIS — R293 Abnormal posture: Secondary | ICD-10-CM | POA: Diagnosis not present

## 2017-05-18 NOTE — Therapy (Signed)
[facility] [address] [place], [place], [address] Phone: [phone]   Fax:  [phone]  Physical Therapy Evaluation  Patient Details  Name: [name] MRN: [id number] Date of Birth: 05/05/1961 Referring Provider: Tammi Sou, MD  Encounter Date: 05/18/2017      PT End of Session - 05/18/17 1635    Visit Number 1   Number of Visits 8   Date for PT Re-Evaluation 06/15/17   Authorization Type [place] [employer]   PT Start Time 2409   PT Stop Time 1632   PT Time Calculation (min) 35 min   Equipment Utilized During Treatment Gait belt   Activity Tolerance Patient tolerated treatment well   Behavior During Therapy [facility] for tasks assessed/performed      Past Medical History:  Diagnosis Date  . Adult ADHD   . Constipation, chronic    Has caused irritative urinary/bladder symptoms in the past.  . Depression   . Dizziness 06/2011+   Noncontrast CT and noncontrast MRI both negative 06/2011 at [facility] ED visit.  . Elevated transaminase level    Liver biopsy revealed fatty liver  . Erectile dysfunction   . Facial fracture ([place]) 06/2011   Nondisplaced bilateral LeFort I fracture: no surgery required. (ENT, Dr. Wilburn Cornelia)   . Fatty liver disease, nonalcoholic   . GERD (gastroesophageal reflux disease)   . History of thyroid cancer 2010   Medullary carcinoma of thyroid. Thyroidectomy 08/2009 (Dr. Aida Puffer at [facility]).  Needs periodic serum calcitonin monitoring.  Marland Kitchen Hyperlipidemia   . Hypothyroidism   . LVH (left ventricular hypertrophy)    by EKG criteria 06/2011.  Hx of heart murmur--Followed by cardiology at [facility]: EF's have been "stable" per cardio office note 12/29/10.  His echo 12/2012 showed normal LV size and wall thickness but EF 45-50% and global LV hypokinesis was found.  . Myotonic dystrophy ([place]) Dx'd 1996   Progressive muscle weakness and swallowing dysfunction.  Initial dx by Dr. Jannifer Franklin, neurologist, but subsequent  annual specialist f/u has been with Dr. Ladon Applebaum [facility].   . Nephrolithiasis   . OSA (obstructive sleep apnea)    does not wear CPAP  . S/P colonoscopy    normal around 2010 per pt's wife    Past Surgical History:  Procedure Laterality Date  . EXTRACORPOREAL SHOCK WAVE LITHOTRIPSY    . THYROIDECTOMY  08/2009   for medullary thyroid cancer (Dr. Conley Canal at [facility] follows him for this).  . TRANSTHORACIC ECHOCARDIOGRAM  12/2012   Normal LV size and wall thickness, LV EF 45-50%, with global hypokinesis of LV.    There were no vitals filed for this visit.       Subjective Assessment - 05/18/17 1559    Subjective Pt is a 56 y/o male who presents to OPPT with diagnosis of myotonic dystrophy and reports of recent falls.  Pt and wife report approx 1 fall per month, but recently 1 fall each week x 3 weeks.  Pt diagnosed at least 20 years ago, but wife reports decreased function for approx the past year.  Recently hospitalized in [facility] for concussion following fall, and also noted to have PNA as well.   Patient is accompained by: Family member   Pertinent History myotonic dystrophy, adult ADHD, depression. HLD   Limitations Walking   Patient Stated Goals improve balance, maintain strength   Currently in Pain? No/denies  reports chronic generalized pain; will not directly address  [facility] PT Assessment - 05/18/17 1602      Assessment   Medical Diagnosis myotonic dystrophy, decreased balance   Referring Provider Tammi Sou, MD   Onset Date/Surgical Date --  1 year exacerbation   Hand Dominance Right   Next MD Visit 3 months   Prior Therapy while in hospital in [facility], no formal PT-wife reports he was given exercises "but he won't do them"     Precautions   Precautions Fall     Restrictions   Weight Bearing Restrictions No     Balance Screen   Has the patient fallen in the past 6 months Yes   How many times? 6   Has the patient had a decrease in activity level  because of a fear of falling?  Yes   Is the patient reluctant to leave their home because of a fear of falling?  No     Home Social worker Private residence   Living Arrangements Spouse/significant other;Children   Type of Effort to enter   Entrance Stairs-Number of Steps 5   Entrance Stairs-Rails Right;Left;Can reach both   [place] Two level;Bed/bath upstairs;Able to live on main level with bedroom/bathroom  pt sleeps downstairs   Home Equipment None     Prior Function   Level of Independence Independent with basic ADLs  drives occasionally   Vocation On disability   Leisure watch TV, enjoys football     Cognition   Overall Cognitive Status Within Functional Limits for tasks assessed     Posture/Postural Control   Posture/Postural Control Postural limitations   Postural Limitations Rounded Shoulders;Forward head;Flexed trunk     Strength   Overall Strength Comments difficulty with initiating of tasks   Strength Assessment Site Hip;Knee;Ankle   Right/Left Hip Right;Left   Right Hip Flexion 3/5   Left Hip Flexion 3/5   Right/Left Knee Right;Left   Right Knee Flexion 4/5   Right Knee Extension 5/5   Left Knee Flexion 4/5   Left Knee Extension 5/5   Right/Left Ankle Right;Left   Right Ankle Dorsiflexion 4/5   Left Ankle Dorsiflexion 4/5     Transfers   Five time sit to stand comments  18.16 sec     Ambulation/Gait   Ambulation/Gait Yes   Ambulation/Gait Assistance 5: Supervision   Ambulation Distance (Feet) 150 Feet   Assistive device None   Gait Pattern Decreased arm swing - right;Decreased arm swing - left;Decreased step length - right;Decreased step length - left;Decreased stride length;Shuffle  Rt foot slap   Ambulation Surface Level;Indoor   Gait velocity 2.6 ft/sec  12.63 sec     Standardized Balance Assessment   Standardized Balance Assessment Berg Balance Test;Timed Up and Go Test     Berg Balance Test    Sit to Stand Able to stand without using hands and stabilize independently   Standing Unsupported Able to stand safely 2 minutes   Sitting with Back Unsupported but Feet Supported on Floor or Stool Able to sit safely and securely 2 minutes   Stand to Sit Sits safely with minimal use of hands   Transfers Able to transfer safely, minor use of hands   Standing Unsupported with Eyes Closed Able to stand 10 seconds with supervision   Standing Ubsupported with Feet Together Able to place feet together independently and stand for 1 minute with supervision   From Standing, Reach Forward with Outstretched Arm Can reach forward >12 cm safely (  5")   From Standing Position, Pick up Object from [place] to pick up shoe safely and easily   From Standing Position, Turn to Look Behind Over each Shoulder Looks behind from both sides and weight shifts well   Turn 360 Degrees Needs close supervision or verbal cueing   Standing Unsupported, Alternately Place Feet on Step/Stool Able to complete >2 steps/needs minimal assist   Standing Unsupported, One Foot in [place] to take small step independently and hold 30 seconds   Standing on One Leg Tries to lift leg/unable to hold 3 seconds but remains standing independently   Total Score 42   Berg comment: fatigued easily; needs rest breaks     Timed Up and Go Test   Normal TUG (seconds) 13.13                           PT Education - 05/18/17 1635    Education provided Yes   Education Details clinical findings, POC, goals of care   Person(s) Educated Patient;Spouse   Methods Explanation   Comprehension Verbalized understanding             PT Long Term Goals - 05/18/17 1640      PT LONG TERM GOAL #1   Title independent with HEP (06/15/17)   Time 4   Period Weeks   Status New     PT LONG TERM GOAL #2   Title improve BERG balance score to >/= 45/56 for decreased fall risk (06/15/17)   Time 4   Period Weeks   Status New     PT  LONG TERM GOAL #3   Title improve timed up and to to < 12 sec with LRAD for improved functional mobility (06/15/17)   Time 4   Period Weeks   Status New     PT LONG TERM GOAL #4   Title improve 5X STS to < 15 sec for improved functional strength (06/15/17)   Time 4   Period Weeks   Status New     PT LONG TERM GOAL #5   Title verbalize understanding of fall prevention strategies (06/15/17)   Time 4   Period Weeks   Status New               Plan - 05/18/17 1635    Clinical Impression Statement Pt is a 56 y/o male who presents to OPPT for moderate complexity PT eval for decreased balance and hx of falling due to myotonic dystrophy.  Pt demonstrates lower extremity weakness with difficulty initiating tasks as well as poor reaction times, as well as decreased functional mobility and postural abnormalities.  Will benefit from PT to address deficits listed, help maintain independence and address any equipment needs.   Rehab Potential Fair   Clinical Impairments Affecting Rehab Potential progressive diagnosis   PT Frequency 2x / week   PT Duration 4 weeks   PT Treatment/Interventions ADLs/Self Care Home Management;Cryotherapy;Moist Heat;Neuromuscular re-education;Balance training;Therapeutic exercise;Therapeutic activities;Functional mobility training;Stair training;Gait training;Patient/family education;DME Instruction;Manual techniques;Vestibular;Energy conservation   PT Next Visit Plan establish HEP for balance and strengthening (focus on maintaining current strength and exercises pt can safely do at home), monitor for equipment needs   Consulted and Agree with Plan of Care Patient;Family member/caregiver   Family Member Consulted spouse      Patient will benefit from skilled therapeutic intervention in order to improve the following deficits and impairments:  Abnormal gait, Decreased activity tolerance, Decreased knowledge  of use of DME, Decreased strength, Decreased safety  awareness, Decreased balance, Decreased mobility, Decreased endurance, Postural dysfunction  Visit Diagnosis: Muscle weakness (generalized) - Plan: PT plan of care cert/re-cert  Other abnormalities of gait and mobility - Plan: PT plan of care cert/re-cert  Abnormal posture - Plan: PT plan of care cert/re-cert     Problem List Patient Active Problem List   Diagnosis Date Noted  . Aspiration pneumonia ([place]) 05/09/2017  . History of fall 05/09/2017  . Viral gastroenteritis 03/13/2014  . Autonomic dysfunction 09/05/2013  . Tachycardia 02/20/2013  . ADD (attention deficit disorder) 08/15/2012  . Depression 08/15/2012  . History of thyroid cancer   . Dizziness, nonspecific 12/07/2011  . Hypothyroidism 12/07/2011  . DEPRESSION 03/20/2008  . MYOTONIC MUSCULAR DYSTROPHY 03/20/2008  . GERD 03/20/2008  . SLEEP APNEA 03/20/2008      Laureen Abrahams, PT, DPT 05/18/17 4:46 PM    [facility] [place], [place], [address] Phone: [phone]   Fax:  [phone]  Name: [name] MRN: [id number] Date of Birth: 10-02-1961

## 2017-05-23 ENCOUNTER — Ambulatory Visit (INDEPENDENT_AMBULATORY_CARE_PROVIDER_SITE_OTHER): Payer: 59 | Admitting: Physical Therapy

## 2017-05-23 DIAGNOSIS — R293 Abnormal posture: Secondary | ICD-10-CM

## 2017-05-23 DIAGNOSIS — M6281 Muscle weakness (generalized): Secondary | ICD-10-CM

## 2017-05-23 DIAGNOSIS — Z85850 Personal history of malignant neoplasm of thyroid: Secondary | ICD-10-CM | POA: Diagnosis not present

## 2017-05-23 DIAGNOSIS — R2689 Other abnormalities of gait and mobility: Secondary | ICD-10-CM

## 2017-05-23 NOTE — Therapy (Signed)
[facility] [address] [place], [place], [address] Phone: [phone]   Fax:  [phone]  Physical Therapy Treatment  Patient Details  Name: [name] MRN: [id number] Date of Birth: 06/12/1961 Referring Provider: Tammi Sou, MD  Encounter Date: 05/23/2017      PT End of Session - 05/23/17 0839    Visit Number 2   Number of Visits 8   Date for PT Re-Evaluation 06/15/17   Authorization Type [place] [employer]   PT Start Time [phone]   PT Stop Time 0839   PT Time Calculation (min) 40 min   Activity Tolerance Patient tolerated treatment well   Behavior During Therapy [facility] for tasks assessed/performed      Past Medical History:  Diagnosis Date  . Adult ADHD   . Constipation, chronic    Has caused irritative urinary/bladder symptoms in the past.  . Depression   . Dizziness 06/2011+   Noncontrast CT and noncontrast MRI both negative 06/2011 at [facility] ED visit.  . Elevated transaminase level    Liver biopsy revealed fatty liver  . Erectile dysfunction   . Facial fracture ([place]) 06/2011   Nondisplaced bilateral LeFort I fracture: no surgery required. (ENT, Dr. Wilburn Cornelia)   . Fatty liver disease, nonalcoholic   . GERD (gastroesophageal reflux disease)   . History of thyroid cancer 2010   Medullary carcinoma of thyroid. Thyroidectomy 08/2009 (Dr. Aida Puffer at [facility]).  Needs periodic serum calcitonin monitoring.  Marland Kitchen Hyperlipidemia   . Hypothyroidism   . LVH (left ventricular hypertrophy)    by EKG criteria 06/2011.  Hx of heart murmur--Followed by cardiology at [facility]: EF's have been "stable" per cardio office note 12/29/10.  His echo 12/2012 showed normal LV size and wall thickness but EF 45-50% and global LV hypokinesis was found.  . Myotonic dystrophy ([place]) Dx'd 1996   Progressive muscle weakness and swallowing dysfunction.  Initial dx by Dr. Jannifer Franklin, neurologist, but subsequent annual specialist f/u has been with Dr. Ladon Applebaum  [facility]   . Nephrolithiasis   . OSA (obstructive sleep apnea)    does not wear CPAP  . S/P colonoscopy    normal around 2010 per pt's wife    Past Surgical History:  Procedure Laterality Date  . EXTRACORPOREAL SHOCK WAVE LITHOTRIPSY    . THYROIDECTOMY  08/2009   for medullary thyroid cancer (Dr. Conley Canal at [facility] follows him for this).  . TRANSTHORACIC ECHOCARDIOGRAM  12/2012   Normal LV size and wall thickness, LV EF 45-50%, with global hypokinesis of LV.    There were no vitals filed for this visit.      Subjective Assessment - 05/23/17 0759    Subjective doing well today, no complaints   Patient is accompained by: Family member   Pertinent History myotonic dystrophy, adult ADHD, depression. HLD   Patient Stated Goals improve balance, maintain strength   Currently in Pain? No/denies                         [facility] Adult PT Treatment/Exercise - 05/23/17 0802      Exercises   Exercises Knee/Hip     Knee/Hip Exercises: Aerobic   Stationary Bike L1 x 5 min     Knee/Hip Exercises: Seated   Long Arc Quad Both;10 reps;Weights   Long Arc Quad Weight 1 lbs.   Ball Squeeze 10x5 sec   Marching Both;10 reps;Weights   Marching Weights 1 lbs.   Sit to  Sand 10 reps;without UE support     Knee/Hip Exercises: Supine   Short Arc Quad Sets Both;10 reps   Short Arc Quad Sets Limitations 1#   Bridges 10 reps   Straight Leg Raises Both;10 reps   Straight Leg Raises Limitations 1#     Knee/Hip Exercises: Sidelying   Hip ABduction 10 reps             Balance Exercises - 05/23/17 0837      Balance Exercises: Standing   Tandem Stance Eyes open;Upper extremity support 1;2 reps;20 secs   SLS Eyes open;Solid surface;Upper extremity support 2;2 reps;20 secs           PT Education - 05/23/17 0840    Education provided Yes   Education Details HEP   Person(s) Educated Patient;Spouse   Methods Explanation;Demonstration;Handout   Comprehension Verbalized  understanding;Returned demonstration             PT Long Term Goals - 05/18/17 1640      PT LONG TERM GOAL #1   Title independent with HEP (06/15/17)   Time 4   Period Weeks   Status New     PT LONG TERM GOAL #2   Title improve BERG balance score to >/= 45/56 for decreased fall risk (06/15/17)   Time 4   Period Weeks   Status New     PT LONG TERM GOAL #3   Title improve timed up and to to < 12 sec with LRAD for improved functional mobility (06/15/17)   Time 4   Period Weeks   Status New     PT LONG TERM GOAL #4   Title improve 5X STS to < 15 sec for improved functional strength (06/15/17)   Time 4   Period Weeks   Status New     PT LONG TERM GOAL #5   Title verbalize understanding of fall prevention strategies (06/15/17)   Time 4   Period Weeks   Status New               Plan - 05/23/17 0840    Clinical Impression Statement Pt tolerated session well today which focused on establishing HEP.  Pt with minimal motivation, and feels he doesn't need AD despite falls.  Inititated discussion and recommendation for Rollator to have for safety and seat to allow for rest breaks.  Pt agreeable to obtain order from MD.   Clinical Impairments Affecting Rehab Potential progressive diagnosis   PT Treatment/Interventions ADLs/Self Care Home Management;Cryotherapy;Moist Heat;Neuromuscular re-education;Balance training;Therapeutic exercise;Therapeutic activities;Functional mobility training;Stair training;Gait training;Patient/family education;DME Instruction;Manual techniques;Vestibular;Energy conservation   PT Next Visit Plan review HEP for strengthening (focus on maintaining current strength and exercises pt can safely do at home), monitor for equipment needs, balance exercises   Consulted and Agree with Plan of Care Patient;Family member/caregiver   Family Member Consulted spouse      Patient will benefit from skilled therapeutic intervention in order to improve the following  deficits and impairments:  Abnormal gait, Decreased activity tolerance, Decreased knowledge of use of DME, Decreased strength, Decreased safety awareness, Decreased balance, Decreased mobility, Decreased endurance, Postural dysfunction  Visit Diagnosis: Muscle weakness (generalized)  Other abnormalities of gait and mobility  Abnormal posture     Problem List Patient Active Problem List   Diagnosis Date Noted  . Aspiration pneumonia ([place]) 05/09/2017  . History of fall 05/09/2017  . Viral gastroenteritis 03/13/2014  . Autonomic dysfunction 09/05/2013  . Tachycardia 02/20/2013  . ADD (attention deficit disorder) 08/15/2012  .  Depression 08/15/2012  . History of thyroid cancer   . Dizziness, nonspecific 12/07/2011  . Hypothyroidism 12/07/2011  . DEPRESSION 03/20/2008  . MYOTONIC MUSCULAR DYSTROPHY 03/20/2008  . GERD 03/20/2008  . SLEEP APNEA 03/20/2008      Laureen Abrahams, PT, DPT 05/23/17 8:44 AM    [facility] [place], [place], [address] Phone: [phone]   Fax:  [phone]  Name: [name] MRN: [id number] Date of Birth: 1961-10-22

## 2017-05-23 NOTE — Patient Instructions (Signed)
KNEE: Extension, Long Arc Quad (Weight)    Place weight around leg. Raise leg until knee is straight. Hold __1-2_ seconds. Use _0-1__ lb weight. _10__ reps per set, _1-2__ sets per day, _6-7__ days per week  Copyright  VHI. All rights reserved.    FLEXION: Sitting (Active)    Sit, both feet flat. Lift right knee toward ceiling. Use _0-1__ lbs. Complete __1_ sets of _10__ repetitions. Perform _1-2__ sessions per day.  Copyright  VHI. All rights reserved.             Isometric Hip Adduction    With towel rolled between knees, press thighs together. Hold __5__ seconds while counting out loud. Repeat __10__ times. Do __1-2__ sessions per day.  http://[email or web address].exer.us/629   Copyright  VHI. All rights reserved.    Functional Quadriceps: Sit to Stand    Sit on edge of chair, feet flat on floor. Stand upright, extending knees fully. Repeat __10__ times per set. Do _1___ sets per session. Do _1-2___ sessions per day.  http://orth.exer.us/734       Bridging    Slowly raise buttocks from floor, keeping stomach tight.  Hold for 2-3 seconds. Repeat _10___ times per set. Do __1__ sets per session. Do __1-2__ sessions per day.  http://orth.exer.us/1096   Copyright  VHI. All rights reserved.   Straight Leg Raise    Slowly raise locked right leg __6-8__ inches from floor.  Use 0-1 lb. Ankle weights Repeat _10___ times per set. Do __1__ sets per session. Do _1-2___ sessions per day.  http://orth.exer.us/1102   Copyright  VHI. All rights reserved.    KNEE: Extension, Short Arc Quads - Supine    Place bolster under knees. Raise one leg until knee is straight. _10__ reps per set, _1-2__ sets per day, _6-7__ days per week      Abduction: Side Leg Lift (Eccentric) - Side-Lying    Lie on side. Lift top leg slightly higher than shoulder level. Keep top leg straight with body, toes pointing forward. Slowly lower. _10__ reps per set, _1-2__ sets per  day, _6-7__ days per week.  http://ecce.exer.us/62   Copyright  VHI. All rights reserved.

## 2017-05-26 DIAGNOSIS — R911 Solitary pulmonary nodule: Secondary | ICD-10-CM

## 2017-05-26 HISTORY — DX: Solitary pulmonary nodule: R91.1

## 2017-05-29 ENCOUNTER — Ambulatory Visit (INDEPENDENT_AMBULATORY_CARE_PROVIDER_SITE_OTHER): Payer: 59 | Admitting: Physical Therapy

## 2017-05-29 DIAGNOSIS — M6281 Muscle weakness (generalized): Secondary | ICD-10-CM

## 2017-05-29 DIAGNOSIS — R2689 Other abnormalities of gait and mobility: Secondary | ICD-10-CM

## 2017-05-29 DIAGNOSIS — R293 Abnormal posture: Secondary | ICD-10-CM

## 2017-05-29 NOTE — Therapy (Addendum)
[facility] [address] [place], [place], [address] Phone: [phone]   Fax:  [phone]  Physical Therapy Treatment  Patient Details  Name: [name] MRN: [id number] Date of Birth: 20-Jul-1961 Referring Provider: Tammi Sou, MD  Encounter Date: 05/29/2017      PT End of Session - 05/29/17 1522    Visit Number 3   Number of Visits 8   Date for PT Re-Evaluation 06/15/17   Authorization Type [place] [employer]   PT Start Time 1428   PT Stop Time 1510   PT Time Calculation (min) 42 min   Equipment Utilized During Treatment Gait belt   Activity Tolerance Patient tolerated treatment well   Behavior During Therapy [facility] for tasks assessed/performed      Past Medical History:  Diagnosis Date  . Adult ADHD   . Constipation, chronic    Has caused irritative urinary/bladder symptoms in the past.  . Depression   . Dizziness 06/2011+   Noncontrast CT and noncontrast MRI both negative 06/2011 at [facility] ED visit.  . Elevated transaminase level    Liver biopsy revealed fatty liver  . Erectile dysfunction   . Facial fracture ([place]) 06/2011   Nondisplaced bilateral LeFort I fracture: no surgery required. (ENT, Dr. Wilburn Cornelia)   . Fatty liver disease, nonalcoholic   . GERD (gastroesophageal reflux disease)   . History of thyroid cancer 2010   Medullary carcinoma of thyroid. Thyroidectomy 08/2009 (Dr. Aida Puffer at [facility]).  Needs periodic serum calcitonin monitoring.  Marland Kitchen Hyperlipidemia   . Hypothyroidism   . LVH (left ventricular hypertrophy)    by EKG criteria 06/2011.  Hx of heart murmur--Followed by cardiology at [facility]: EF's have been "stable" per cardio office note 12/29/10.  His echo 12/2012 showed normal LV size and wall thickness but EF 45-50% and global LV hypokinesis was found.  . 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  Initial dx by Dr. Jannifer Franklin, neurologist, but subsequent  annual specialist f/u has been with Dr. Ladon Applebaum [facility]   . Nephrolithiasis   . OSA (obstructive sleep apnea)    does not wear CPAP  . S/P colonoscopy    normal around 2010 per pt's wife    Past Surgical History:  Procedure Laterality Date  . EXTRACORPOREAL SHOCK WAVE LITHOTRIPSY    . THYROIDECTOMY  08/2009   for medullary thyroid cancer (Dr. Conley Canal at [facility] follows him for this).  . TRANSTHORACIC ECHOCARDIOGRAM  12/2012   Normal LV size and wall thickness, LV EF 45-50%, with global hypokinesis of LV.    There were no vitals filed for this visit.      Subjective Assessment - 05/29/17 1430    Subjective doing well, no falls to report.  states he needs his wife to "help with exercises on the floor" (Instructed pt to perform exercises in bed instead)   Pertinent History myotonic dystrophy, adult ADHD, depression. HLD   Patient Stated Goals improve balance, maintain strength   Currently in Pain? No/denies                         [facility] Adult PT Treatment/Exercise - 05/29/17 1432      Ambulation/Gait   Gait Comments amb 100'x 3 with supervision; cues for increased heel strike and improved arm swing and trunk rotation with min improvments     Knee/Hip Exercises: Aerobic   Stationary Bike L1 x 7 min  Knee/Hip Exercises: Standing   Forward Step Up Both;5 reps;Hand Hold: 2;Step Height: 6"  treadmill height   Other Standing Knee Exercises modified PWR! moves x 10 reps each for larger amplitude movements     Knee/Hip Exercises: Seated   Sit to Sand 10 reps;without UE support                PT Education - 05/29/17 1522    Education provided Yes   Education Details standing HEP   Person(s) Educated Patient;Spouse   Methods Explanation;Demonstration;Handout   Comprehension Verbalized understanding;Returned demonstration;Need further instruction             PT Long Term Goals - 05/18/17 1640      PT LONG TERM GOAL #1   Title independent  with HEP (06/15/17)   Time 4   Period Weeks   Status New     PT LONG TERM GOAL #2   Title improve BERG balance score to >/= 45/56 for decreased fall risk (06/15/17)   Time 4   Period Weeks   Status New     PT LONG TERM GOAL #3   Title improve timed up and to to < 12 sec with LRAD for improved functional mobility (06/15/17)   Time 4   Period Weeks   Status New     PT LONG TERM GOAL #4   Title improve 5X STS to < 15 sec for improved functional strength (06/15/17)   Time 4   Period Weeks   Status New     PT LONG TERM GOAL #5   Title verbalize understanding of fall prevention strategies (06/15/17)   Time 4   Period Weeks   Status New               Plan - 05/29/17 1523    Clinical Impression Statement Pt came back to PT session alone today and wife stayed out in lobby during session.  Asked pt what his goals were and he stated "die."  Pt then stated he didn't want to die but expressed frustration with current functional limitation.  Recommended he consider looking into adaptive clinics through [facility] and pt appeared mildly interested.  Suicide screen completed and pt answered "no" to questions 1 and 2.  Will continue to benefit from PT to maximize function and independence.  Therapeutic rest breaks needed throughout due to fatigue.   Clinical Impairments Affecting Rehab Potential progressive diagnosis   PT Treatment/Interventions ADLs/Self Care Home Management;Cryotherapy;Moist Heat;Neuromuscular re-education;Balance training;Therapeutic exercise;Therapeutic activities;Functional mobility training;Stair training;Gait training;Patient/family education;DME Instruction;Manual techniques;Vestibular;Energy conservation   PT Next Visit Plan review HEP, continue balance, strengthening, functional mobility, address equipment needs PRN   Consulted and Agree with Plan of Care Patient;Family member/caregiver   Family Member Consulted spouse        Screening for  Suicide  Answer the following questions with Yes or No and place an "x" beside the action taken.  1. Over the past two weeks, have you felt down, depressed, or hopeless?   n  2. Within the past two weeks, have you felt little interest or pleasure in life?  n  If YES to either #1 or #2, then ask #3  3. Have you had thoughts that that life is not worth living or that you might be       better off dead?     If answer is NO and suspicion is low, then end   4. Over this past week, have you had any  thoughts about hurting or even killing yourself?    If NO, then end. Patient in no immediate danger   5. If so, do you believe that you intend to or will harm yourself?       If NO, then end. Patient in no immediate danger   6.  Do you have a plan as to how you would hurt yourself?     7.  Over this past week, have you actually done anything to hurt yourself?    IF YES answers to either #4, #5, #6 or #7, then patient is AT RISK for suicide   Actions Taken  ____  Screening negative; no further action required  ____  Screening positive; no immediate danger and patient already in treatment with a  mental health provider. Advise patient to speak to their mental health provider.  ____  Screening positive; no immediate danger. Patient advised to contact a mental  health provider for further assessment.   ____  Screening positive; in immediate danger as patient states intention of killing self,  has plan and a sense of imminence. Do not leave alone. Seek permission from  patient to contact a family member to inform them. Direct patient to go to ED.     Patient will benefit from skilled therapeutic intervention in order to improve the following deficits and impairments:  Abnormal gait, Decreased activity tolerance, Decreased knowledge of use of DME, Decreased strength, Decreased safety awareness, Decreased balance, Decreased mobility, Decreased endurance, Postural dysfunction  Visit  Diagnosis: Muscle weakness (generalized)  Other abnormalities of gait and mobility  Abnormal posture     Problem List Patient Active Problem List   Diagnosis Date Noted  . Aspiration pneumonia ([place]) 05/09/2017  . History of fall 05/09/2017  . Viral gastroenteritis 03/13/2014  . Autonomic dysfunction 09/05/2013  . Tachycardia 02/20/2013  . ADD (attention deficit disorder) 08/15/2012  . Depression 08/15/2012  . History of thyroid cancer   . Dizziness, nonspecific 12/07/2011  . Hypothyroidism 12/07/2011  . DEPRESSION 03/20/2008  . MYOTONIC MUSCULAR DYSTROPHY 03/20/2008  . GERD 03/20/2008  . SLEEP APNEA 03/20/2008      Laureen Abrahams, PT, DPT 05/29/17 3:28 PM    [place], [place], [address] Phone: [phone]   Fax:  [phone]  Name: [name] MRN: [id number] Date of Birth: 02/11/1961       PHYSICAL THERAPY DISCHARGE SUMMARY  Visits from Start of Care: 3  Current functional level related to goals / functional outcomes: See above; pt canceled all remaining appointments   Remaining deficits: Unknown; pt was high risk for falls but lacked awareness of deficits and refused DME recommendations   Education / Equipment: HEP, recommended rollator; unlikely to pursue  Plan: Patient agrees to discharge.  Patient goals were not met. Patient is being discharged due to the patient's request.  ?????     Laureen Abrahams, PT, DPT 06/07/17 10:32 AM

## 2017-06-01 ENCOUNTER — Ambulatory Visit (HOSPITAL_BASED_OUTPATIENT_CLINIC_OR_DEPARTMENT_OTHER)
Admission: RE | Admit: 2017-06-01 | Discharge: 2017-06-01 | Disposition: A | Discharge status: Home / Self Care | Payer: 59 | Source: Ambulatory Visit | Attending: Family Medicine | Admitting: Family Medicine

## 2017-06-01 DIAGNOSIS — R911 Solitary pulmonary nodule: Secondary | ICD-10-CM | POA: Diagnosis not present

## 2017-06-01 DIAGNOSIS — R938 Abnormal findings on diagnostic imaging of other specified body structures: Secondary | ICD-10-CM | POA: Diagnosis not present

## 2017-06-01 DIAGNOSIS — R9389 Abnormal findings on diagnostic imaging of other specified body structures: Secondary | ICD-10-CM

## 2017-06-01 DIAGNOSIS — R918 Other nonspecific abnormal finding of lung field: Secondary | ICD-10-CM | POA: Diagnosis not present

## 2017-06-02 ENCOUNTER — Telehealth: Payer: Self-pay | Admitting: *Deleted

## 2017-06-02 NOTE — Telephone Encounter (Signed)
-----   Message from Tammi Sou, MD sent at 06/01/2017  5:01 PM EDT ----- Regarding: walker rx I wrote this rx out and put it on your keyboard.-thx  ----- Message ----- From: Laureen Abrahams, PT Sent: 05/29/2017   3:33 PM To: Tammi Sou, MD  FYI- Wife stayed in lobby today during PT session.  I asked him specifically what his goals were and he said "die."  Performed suicide screen and he answered "no" to questions 1 & 2, but I felt you should know.  He is pretty adamant against any assistive devices, but has agreed for me to at least reach out to you for an order for a rollator.  He can then take it to a medical supply store to obtain.  Order must specifically state "4-wheeled rolling walker with a seat."  Would you mind putting that order in for me?    Thanks- Colletta [place]

## 2017-06-02 NOTE — Telephone Encounter (Signed)
Rx put up front with a list of medical supply companies. Left message for pt to call back.

## 2017-06-06 NOTE — Telephone Encounter (Signed)
Left message for pt to call back  °

## 2017-06-06 NOTE — Telephone Encounter (Signed)
Patient notified and verbalized understanding. 

## 2017-06-12 ENCOUNTER — Encounter: Payer: Self-pay | Admitting: Family Medicine

## 2017-06-12 ENCOUNTER — Other Ambulatory Visit: Payer: Self-pay | Admitting: Family Medicine

## 2017-06-12 DIAGNOSIS — R911 Solitary pulmonary nodule: Secondary | ICD-10-CM

## 2017-07-02 ENCOUNTER — Encounter: Payer: Self-pay | Admitting: Family Medicine

## 2017-07-05 ENCOUNTER — Encounter: Payer: Self-pay | Admitting: Family Medicine

## 2017-07-27 ENCOUNTER — Other Ambulatory Visit: Payer: Self-pay | Admitting: Family Medicine

## 2017-07-27 NOTE — Telephone Encounter (Signed)
[place]  RF request for levothyroxine LOV: 02/24/17 Next ov: None Last written: 02/10/17 #30 w/ 3RF  02/09/17 TSH 4.06

## 2017-08-17 ENCOUNTER — Other Ambulatory Visit: Payer: Self-pay | Admitting: Family Medicine

## 2017-08-17 NOTE — Telephone Encounter (Signed)
Will RF lexapro #30 with 1 RF. Needs office f/u to re-evaluate depression before he runs out of this in 2 mo.

## 2017-08-17 NOTE — Telephone Encounter (Signed)
[name]  RF request for escitalopram LOV: 05/09/17 Next ov: None Last written: 02/24/17 #30 w/ 2Rf  Please advise. Thanks.

## 2017-08-21 NOTE — Telephone Encounter (Signed)
Left message for pts wife to call back, okay per DPR.

## 2017-08-23 NOTE — Telephone Encounter (Signed)
MyChart message read.

## 2017-09-03 ENCOUNTER — Other Ambulatory Visit: Payer: Self-pay | Admitting: Family Medicine

## 2017-09-03 ENCOUNTER — Ambulatory Visit (HOSPITAL_BASED_OUTPATIENT_CLINIC_OR_DEPARTMENT_OTHER)
Admission: RE | Admit: 2017-09-03 | Discharge: 2017-09-03 | Disposition: A | Discharge status: Home / Self Care | Payer: 59 | Source: Ambulatory Visit | Attending: Family Medicine | Admitting: Family Medicine

## 2017-09-03 ENCOUNTER — Encounter: Payer: Self-pay | Admitting: Family Medicine

## 2017-09-03 DIAGNOSIS — R918 Other nonspecific abnormal finding of lung field: Secondary | ICD-10-CM | POA: Diagnosis not present

## 2017-09-03 DIAGNOSIS — I700 Atherosclerosis of aorta: Secondary | ICD-10-CM | POA: Diagnosis not present

## 2017-09-03 DIAGNOSIS — R911 Solitary pulmonary nodule: Secondary | ICD-10-CM | POA: Diagnosis not present

## 2017-09-03 DIAGNOSIS — G7111 Myotonic muscular dystrophy: Secondary | ICD-10-CM

## 2017-09-03 DIAGNOSIS — J690 Pneumonitis due to inhalation of food and vomit: Secondary | ICD-10-CM

## 2017-09-03 MED ORDER — LEVOFLOXACIN 500 MG PO TABS: 500.0000 mg | ORAL_TABLET | Freq: Every day | ORAL | 0 refills | Status: DC

## 2017-09-04 ENCOUNTER — Telehealth: Payer: Self-pay | Admitting: *Deleted

## 2017-09-04 DIAGNOSIS — J690 Pneumonitis due to inhalation of food and vomit: Secondary | ICD-10-CM

## 2017-09-04 DIAGNOSIS — G7111 Myotonic muscular dystrophy: Secondary | ICD-10-CM

## 2017-09-04 NOTE — Telephone Encounter (Signed)
OK, orders for SLP modified barium swallow entered.

## 2017-09-04 NOTE — Telephone Encounter (Signed)
[name] called wanting to know if Dr. Anitra Lauth wanted pt to have a modified barium swallow or the DG swallow function.  I sw Dr. Anitra Lauth he stated that he wants a test that will check to see if the pt is aspirating.   SW Abigail Butts and she stated that Dr. Anitra Lauth will need to order test code [id number]. She did say to leave the other orders in as well.

## 2017-09-07 ENCOUNTER — Ambulatory Visit (INDEPENDENT_AMBULATORY_CARE_PROVIDER_SITE_OTHER): Payer: 59 | Admitting: Family Medicine

## 2017-09-07 ENCOUNTER — Encounter: Payer: Self-pay | Admitting: Family Medicine

## 2017-09-07 VITALS — BP 111/74 | HR 114 | Temp 98.6°F | Resp 16 | Ht 66.0 in | Wt 198.0 lb

## 2017-09-07 DIAGNOSIS — E890 Postprocedural hypothyroidism: Secondary | ICD-10-CM | POA: Diagnosis not present

## 2017-09-07 DIAGNOSIS — G7111 Myotonic muscular dystrophy: Secondary | ICD-10-CM

## 2017-09-07 DIAGNOSIS — R400 Somnolence: Secondary | ICD-10-CM | POA: Diagnosis not present

## 2017-09-07 DIAGNOSIS — J690 Pneumonitis due to inhalation of food and vomit: Secondary | ICD-10-CM

## 2017-09-07 DIAGNOSIS — F909 Attention-deficit hyperactivity disorder, unspecified type: Secondary | ICD-10-CM | POA: Diagnosis not present

## 2017-09-07 DIAGNOSIS — Z9189 Other specified personal risk factors, not elsewhere classified: Secondary | ICD-10-CM | POA: Diagnosis not present

## 2017-09-07 MED ORDER — AMPHETAMINE-DEXTROAMPHETAMINE 20 MG PO TABS: ORAL_TABLET | ORAL | 0 refills | Status: DC

## 2017-09-07 MED ORDER — ESCITALOPRAM OXALATE 10 MG PO TABS: 10.0000 mg | ORAL_TABLET | Freq: Every day | ORAL | 1 refills | Status: DC

## 2017-09-07 MED ORDER — OMEPRAZOLE 40 MG PO CPDR: 40.0000 mg | DELAYED_RELEASE_CAPSULE | Freq: Every day | ORAL | 3 refills | Status: DC

## 2017-09-07 MED ORDER — LEVOTHYROXINE SODIUM 200 MCG PO TABS: ORAL_TABLET | ORAL | 5 refills | Status: DC

## 2017-09-07 NOTE — Progress Notes (Signed)
OFFICE VISIT  09/07/2017   CC:  Chief Complaint  Patient presents with  . Follow-up    RCI   HPI:    Patient is a 56 y.o. Caucasian male who presents for f/u myotonic dystrophy, hypothyroidism, and very poor focus/adult ADD---with excessive daytime somnolence.  Got some PT after last visit but quit early b/c he felt like it was too expensive.  He has had a cough for about a week or so.  No fevers.  No SOB or wheezing.  Denies any coughing/choking/gagging when he eats. He feels occ GERD go to the back of his throat but says it does not cause him to choke/cough/gag. Taking gas-ex but not H2 blocker or PPI.  ADD/somnolence: takes adderall 1-2 times daily. Hypoth: says he takes synthroid daily.  Has hx of aspiration pneumonia.  I recently ordered a speech therapy swallowing study to check for aspiration.  On recent CT w/out contrast 09/03/17 done for f/u pulm nodule the results showed: IMPRESSION: 1. Interval resolution of previously described nodule within the right upper lobe. 2. Interval development of patchy ground-glass opacities within the right middle and right lower lobes likely infectious or inflammatory in etiology. 3. Aortic Atherosclerosis (ICD10-I70.0).  I started him on levaquin on a 10 day course of levaquin starting 09/03/17.  Past Medical History:  Diagnosis Date  . Adult ADHD   . Aspiration pneumonitis ([place]) 04/2017, 08/2017   Recurrent  . Constipation, chronic    Has caused irritative urinary/bladder symptoms in the past.  . Depression   . Dizziness 06/2011+   Noncontrast CT and noncontrast MRI both negative 06/2011 at [facility] ED visit.  . Elevated transaminase level    Liver biopsy revealed fatty liver  . Erectile dysfunction   . Facial fracture ([place]) 06/2011   Nondisplaced bilateral LeFort I fracture: no surgery required. (ENT, Dr. Wilburn Cornelia)   . Fatty liver disease, nonalcoholic   . GERD (gastroesophageal reflux disease)   . History of thyroid cancer 2010   Medullary carcinoma of thyroid. Thyroidectomy 08/2009 (Dr. Aida Puffer at [facility]).  Needs periodic serum calcitonin monitoring.  Marland Kitchen Hyperlipidemia   . Hypothyroidism   . LVH (left ventricular hypertrophy)    by EKG criteria 06/2011.  Hx of heart murmur--Followed by cardiology at [facility]: EF's have been "stable" per cardio office note 12/29/10.  His echo 12/2012 showed normal LV size and wall thickness but EF 45-50% and global LV hypokinesis was found.  . 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  Initial dx by Dr. Jannifer Franklin, neurologist, but subsequent annual specialist f/u has been with Dr. Ladon Applebaum [facility].  Progressive debilitation--PT referral 04/2017, pt quit PT 05/2017.  . Nephrolithiasis   . OSA (obstructive sleep apnea)    does not wear CPAP  . Pulmonary nodule, right 05/2017   RUL, 7 mm sub solid nodule---needs f/u noncontrast chest CT 3 mo (ordered)  . S/P colonoscopy    normal around 2010 per pt's wife    Past Surgical History:  Procedure Laterality Date  . EXTRACORPOREAL SHOCK WAVE LITHOTRIPSY    . THYROIDECTOMY  08/2009   for medullary thyroid cancer (Dr. Conley Canal at [facility] follows him for this).  . TRANSTHORACIC ECHOCARDIOGRAM  12/2012   Normal LV size and wall thickness, LV EF 45-50%, with global hypokinesis of LV.    Outpatient Medications Prior to Visit  Medication Sig Dispense Refill  . acetaminophen (TYLENOL) 500 MG tablet Take 1,000 mg by mouth every 6 (six) hours  as needed.     Marland Kitchen levofloxacin (LEVAQUIN) 500 MG tablet Take 1 tablet (500 mg total) by mouth daily. 10 tablet 0  . traZODone (DESYREL) 100 MG tablet TAKE 1 TABLET BY MOUTH AT BEDTIME AS NEEDED FOR SLEEP. 30 tablet 11  . amphetamine-dextroamphetamine (ADDERALL) 20 MG tablet 1 tab po tid 90 tablet 0  . escitalopram (LEXAPRO) 10 MG tablet TAKE 1 TABLET BY MOUTH DAILY 30 tablet 1  . famotidine (PEPCID AC) 10 MG chewable tablet Chew 1 tablet (10 mg total) by mouth 2 (two)  times daily as needed for heartburn. 60 tablet 12  . levothyroxine (SYNTHROID, LEVOTHROID) 200 MCG tablet TAKE 1 TABLET BY MOUTH ONCE DAILY BEFORE BREAKFAST 30 tablet 5  . Probiotic Product (PROBIOTIC PO) Take 1 capsule by mouth daily.     No facility-administered medications prior to visit.     No Known Allergies  ROS As per HPI  PE: Blood pressure 111/74, pulse (!) 114, temperature 98.6 F (37 C), temperature source Temporal, resp. rate 16, height 5\' 6"  (1.676 m), weight 198 lb (89.8 kg), SpO2 92 %. Gen: Alert, well appearing.  Patient is oriented to person, place, time, and situation. Affect: pleasant but flat, no variation in facial expression. FXT:KWIO: no injection, icteris, swelling, or exudate.  EOMI, PERRLA. Mouth: lips without lesion/swelling.  Oral mucosa pink and slightly dry. Oropharynx without erythema, exudate, or swelling.  Neck - No masses or thyromegaly or limitation in range of motion CV: Regular, mild tachycardia, no m/r/g.   LUNGS: CTA bilat, nonlabored resps, good aeration in all lung fields. ABd: soft, NT/ND  .  LABS:    Chemistry      Component Value Date/Time   NA 140 02/09/2017 1101   K 5.5 slight hemolysis (H) 02/09/2017 1101   CL 102 02/09/2017 1101   CO2 32 02/09/2017 1101   BUN 13 02/09/2017 1101   CREATININE 0.90 02/09/2017 1101   CREATININE 0.95 02/20/2013 1604      Component Value Date/Time   CALCIUM 10.1 02/09/2017 1101   ALKPHOS 72 02/09/2017 1101   AST 25 02/09/2017 1101   ALT 23 02/09/2017 1101   BILITOT 0.4 02/09/2017 1101     Lab Results  Component Value Date   WBC 3.7 (L) 02/20/2013   HGB 14.7 02/20/2013   HCT 43.3 02/20/2013   MCV 89.6 02/20/2013   PLT 206 02/20/2013   Lab Results  Component Value Date   TSH 4.06 02/09/2017   Lab Results  Component Value Date   CHOL 231 (H) 02/09/2017   HDL 69.30 02/09/2017   LDLCALC 130 (H) 02/09/2017   LDLDIRECT 147.1 08/15/2012   TRIG 159.0 (H) 02/09/2017   CHOLHDL 3  02/09/2017   Lab Results  Component Value Date   PSA 0.57 08/15/2012   IMPRESSION AND PLAN:  1) Myotonic dystrophy: generalized weakness is chronic and pretty stable. PT was too expensive.   As per my prior conversation with his cardiologist at [facility] (pt no longer goes to him due to traveling distance), pt is at risk for dysrhythmia with this condition.  Will refer to electrophysiologist locally, as pt and wife did not feel like Dr. Gwenlyn Found was addressing pt's needs adequately.  2) Recurrent aspiration pneumonitis: suspect due to muscular changes due to myotonic dystrophy + uncontrolled GERD.  Start omeprazole 40mg  qd.  Stop pepcid.   Would like him to get recheck CXR in about 10d-ordered today. He'll finish current course of levaquin. He'll get swallowing study scheduled soon  to check for aspiration.  3) Adult ADD/excessive daytime somnolence: The current medical regimen is effective;  continue present plan and medications. I printed rx's for adderall 20mg , 1 tid, #90 today for this month, Oct, and Nov 2018.  Appropriate fill on/after date was noted on each rx.  4) Post-surgical hypothyroidism: monitor TSH today.  An After Visit Summary was printed and given to the patient.  Spent 40 min with pt today, with >50% of this time spent in counseling and care coordination regarding the above problems.   FOLLOW UP: Return in about 3 months (around 12/07/2017) for annual CPE (fasting).  Signed:  Crissie Sickles, MD           09/07/2017

## 2017-09-08 LAB — TSH: TSH: 0.01 mIU/L — ABNORMAL LOW (ref 0.40–4.50)

## 2017-09-20 ENCOUNTER — Other Ambulatory Visit (HOSPITAL_COMMUNITY): Payer: Self-pay | Admitting: Family Medicine

## 2017-09-20 DIAGNOSIS — R1310 Dysphagia, unspecified: Secondary | ICD-10-CM

## 2017-09-27 ENCOUNTER — Ambulatory Visit (HOSPITAL_COMMUNITY)
Admission: RE | Admit: 2017-09-27 | Discharge: 2017-09-27 | Disposition: A | Discharge status: Home / Self Care | Payer: 59 | Source: Ambulatory Visit | Attending: Family Medicine | Admitting: Family Medicine

## 2017-09-27 DIAGNOSIS — J690 Pneumonitis due to inhalation of food and vomit: Secondary | ICD-10-CM | POA: Insufficient documentation

## 2017-09-27 DIAGNOSIS — G7111 Myotonic muscular dystrophy: Secondary | ICD-10-CM

## 2017-09-29 ENCOUNTER — Encounter: Payer: Self-pay | Admitting: Gastroenterology

## 2017-10-02 ENCOUNTER — Institutional Professional Consult (permissible substitution): Payer: 59 | Admitting: Internal Medicine

## 2017-10-03 ENCOUNTER — Ambulatory Visit (INDEPENDENT_AMBULATORY_CARE_PROVIDER_SITE_OTHER): Payer: 59 | Admitting: Internal Medicine

## 2017-10-03 ENCOUNTER — Encounter: Payer: Self-pay | Admitting: Internal Medicine

## 2017-10-03 VITALS — BP 110/84 | HR 110 | Ht 66.0 in | Wt 200.0 lb

## 2017-10-03 DIAGNOSIS — I519 Heart disease, unspecified: Secondary | ICD-10-CM | POA: Diagnosis not present

## 2017-10-03 DIAGNOSIS — G7111 Myotonic muscular dystrophy: Secondary | ICD-10-CM

## 2017-10-03 NOTE — Patient Instructions (Addendum)
Medication Instructions:  Your physician recommends that you continue on your current medications as directed. Please refer to the Current Medication list given to you today.  Labwork: None ordered.  Testing/Procedures: Your physician has requested that you have an echocardiogram. Echocardiography is a painless test that uses sound waves to create images of your heart. It provides your doctor with information about the size and shape of your heart and how well your heart's chambers and valves are working. This procedure takes approximately one hour. There are no restrictions for this procedure.  Please schedule for an ECHOcardiogram.  Follow-Up: Your physician wants you to follow-up in: one year with Dr. Lovena Le.   You will receive a reminder letter in the mail two months in advance. If you don't receive a letter, please call our office to schedule the follow-up appointment.   Any Other Special Instructions Will Be Listed Below (If Applicable).   If you need a refill on your cardiac medications before your next appointment, please call your pharmacy.

## 2017-10-03 NOTE — Progress Notes (Signed)
HPI [name] is referred today by Dr. Ernestine [name] for ongoing evaluation and management of palpitations in the setting of myotonic dystrophy. The patient denies a h/o syncope. He had an echo several years ago described as decreased LV function but followup was normal. He had seen Dr. Rosita [name]. He has not had syncope. He has class 2 CHF symptoms but also admits to being sedentary. He does not have angina.  No Known Allergies   Current Outpatient Prescriptions  Medication Sig Dispense Refill  . acetaminophen (TYLENOL) 500 MG tablet Take 1,000 mg by mouth every 6 (six) hours as needed (pain).     Marland Kitchen amphetamine-dextroamphetamine (ADDERALL) 20 MG tablet 1 tab po tid 90 tablet 0  . escitalopram (LEXAPRO) 10 MG tablet Take 1 tablet (10 mg total) by mouth daily. 90 tablet 1  . levothyroxine (SYNTHROID, LEVOTHROID) 200 MCG tablet Take 1 tablet by mouth on Mon, Tues, Thurs, Fri and Sat and take 1/2 tablet (100 mcg) on Sun and Wed    . omeprazole (PRILOSEC) 40 MG capsule Take 1 capsule (40 mg total) by mouth daily. 90 capsule 3  . traZODone (DESYREL) 100 MG tablet TAKE 1 TABLET BY MOUTH AT BEDTIME AS NEEDED FOR SLEEP. 30 tablet 11   No current facility-administered medications for this visit.      Past Medical History:  Diagnosis Date  . Adult ADHD   . Aspiration pneumonitis ([place]) 04/2017, 08/2017   Recurrent  . Constipation, chronic    Has caused irritative urinary/bladder symptoms in the past.  . Depression   . Dizziness 06/2011+   Noncontrast CT and noncontrast MRI both negative 06/2011 at [facility] ED visit.  . Elevated transaminase level    Liver biopsy revealed fatty liver  . Erectile dysfunction   . Facial fracture ([place]) 06/2011   Nondisplaced bilateral LeFort I fracture: no surgery required. (ENT, Dr. Wilburn [name])   . Fatty liver disease, nonalcoholic   . GERD (gastroesophageal reflux disease)   . History of thyroid cancer 2010   Medullary carcinoma of thyroid. Thyroidectomy 08/2009 (Dr.  Aida [name] at [facility]).  Needs periodic serum calcitonin monitoring.  Marland Kitchen Hyperlipidemia   . Hypothyroidism   . LVH (left ventricular hypertrophy)    by EKG criteria 06/2011.  Hx of heart murmur--Followed by cardiology at [facility]: EF's have been "stable" per cardio office note 12/29/10.  His echo 12/2012 showed normal LV size and wall thickness but EF 45-50% and global LV hypokinesis was found.  . Myotonic dystrophy ([place]) Dx'd 1996   Progressive muscle weakness and swallowing dysfunction.  Initial dx by Dr. Jannifer [name], neurologist, but subsequent annual specialist f/u has been with Dr. Ladon Applebaum [name].  Progressive debilitation--PT referral 04/2017, pt quit PT 05/2017.  . Nephrolithiasis   . OSA (obstructive sleep apnea)    does not wear CPAP  . Pulmonary nodule, right 05/2017   RUL, 7 mm sub solid nodule---needs f/u noncontrast chest CT 3 mo (ordered)  . S/P colonoscopy    normal around 2010 per pt's wife    ROS:   All systems reviewed and negative except as noted in the HPI.   Past Surgical History:  Procedure Laterality Date  . EXTRACORPOREAL SHOCK WAVE LITHOTRIPSY    . THYROIDECTOMY  08/2009   for medullary thyroid cancer (Dr. Conley Canal at [facility] follows him for this).  . TRANSTHORACIC ECHOCARDIOGRAM  12/2012   Normal LV size and wall thickness, LV EF 45-50%, with global hypokinesis of LV.     Family  History  Problem Relation Age of Onset  . Arthritis Mother   . Hypertension Mother   . Alcohol abuse Father   . Arthritis Father   . Hypertension Father   . Colon cancer Father   . Prostate cancer Father      Social History   Social History  . Marital status: Married    Spouse name: N/A  . Number of children: N/A  . Years of education: N/A   Occupational History  . Not on file.   Social History Main Topics  . Smoking status: Never Smoker  . Smokeless tobacco: Never Used  . Alcohol use Yes     Comment: rare beer  . Drug use: No  . Sexual activity: Not on file   Other  Topics Concern  . Not on file   Social History Narrative   Married, one son [name]).   Occupation: Market researcher, currently unemployed, working on appeal for medical disability as of 11/2011.  No tob or drugs.  Drinks alcohol socially.     BP 110/84   Pulse (!) 110   Ht 5\' 6"  (1.676 m)   Wt 200 lb (90.7 kg)   SpO2 96%   BMI 32.28 kg/m   Physical Exam:  stable appearing NAD HEENT: Unremarkable Neck:  6 cm JVD, no thyromegally Lymphatics:  No adenopathy Back:  No CVA tenderness Lungs:  Clear with no wheezes HEART:  Regular rate rhythm, no murmurs, no rubs, no clicks Abd:  soft, positive bowel sounds, no organomegally, no rebound, no guarding Ext:  2 plus pulses, no edema, no cyanosis, no clubbing Skin:  No rashes no nodules Neuro:  CN II through XII intact, motor grossly intact  EKG - sinus tachycardia. NSSTT abnormality  Assess/Plan: 1. Palpitations - his symptoms are controlled at present. He will undergo watchful waiting. 2. LV dysfunction - he has not had an echo in 3 years. I will ask him to undergo this study. Additional rec's will be determined by the results of the echo. 3. Myotonic dystrophy - he appears to be doing resonably well. He has some leg and shoulder wasting which appears to be mild.  Mikle Bosworth.D.

## 2017-10-06 ENCOUNTER — Other Ambulatory Visit (HOSPITAL_COMMUNITY): Payer: 59

## 2017-10-13 ENCOUNTER — Ambulatory Visit (HOSPITAL_COMMUNITY): Payer: 59 | Attending: Cardiovascular Disease

## 2017-10-13 ENCOUNTER — Other Ambulatory Visit: Payer: Self-pay

## 2017-10-13 DIAGNOSIS — G4733 Obstructive sleep apnea (adult) (pediatric): Secondary | ICD-10-CM | POA: Insufficient documentation

## 2017-10-13 DIAGNOSIS — I519 Heart disease, unspecified: Secondary | ICD-10-CM | POA: Insufficient documentation

## 2017-10-13 DIAGNOSIS — E785 Hyperlipidemia, unspecified: Secondary | ICD-10-CM | POA: Diagnosis not present

## 2017-10-13 DIAGNOSIS — G7111 Myotonic muscular dystrophy: Secondary | ICD-10-CM | POA: Diagnosis not present

## 2017-10-13 DIAGNOSIS — E039 Hypothyroidism, unspecified: Secondary | ICD-10-CM | POA: Insufficient documentation

## 2017-10-29 ENCOUNTER — Encounter: Payer: Self-pay | Admitting: Family Medicine

## 2017-12-05 DIAGNOSIS — G7111 Myotonic muscular dystrophy: Secondary | ICD-10-CM | POA: Diagnosis not present

## 2018-01-08 ENCOUNTER — Encounter: Payer: Self-pay | Admitting: Family Medicine

## 2018-01-10 ENCOUNTER — Encounter: Payer: Self-pay | Admitting: Family Medicine

## 2018-01-10 ENCOUNTER — Ambulatory Visit (INDEPENDENT_AMBULATORY_CARE_PROVIDER_SITE_OTHER): Payer: 59 | Admitting: Family Medicine

## 2018-01-10 VITALS — BP 116/81 | HR 86 | Temp 97.5°F | Resp 16 | Wt 197.0 lb

## 2018-01-10 DIAGNOSIS — F988 Other specified behavioral and emotional disorders with onset usually occurring in childhood and adolescence: Secondary | ICD-10-CM

## 2018-01-10 DIAGNOSIS — F330 Major depressive disorder, recurrent, mild: Secondary | ICD-10-CM

## 2018-01-10 DIAGNOSIS — E890 Postprocedural hypothyroidism: Secondary | ICD-10-CM

## 2018-01-10 DIAGNOSIS — Z87898 Personal history of other specified conditions: Secondary | ICD-10-CM | POA: Diagnosis not present

## 2018-01-10 DIAGNOSIS — R1310 Dysphagia, unspecified: Secondary | ICD-10-CM

## 2018-01-10 DIAGNOSIS — Z9189 Other specified personal risk factors, not elsewhere classified: Secondary | ICD-10-CM

## 2018-01-10 DIAGNOSIS — Z1159 Encounter for screening for other viral diseases: Secondary | ICD-10-CM | POA: Diagnosis not present

## 2018-01-10 DIAGNOSIS — K760 Fatty (change of) liver, not elsewhere classified: Secondary | ICD-10-CM | POA: Diagnosis not present

## 2018-01-10 MED ORDER — AMPHETAMINE-DEXTROAMPHETAMINE 20 MG PO TABS
ORAL_TABLET | ORAL | 0 refills | Status: DC
Start: 2018-01-10 — End: 2018-01-10

## 2018-01-10 MED ORDER — ESCITALOPRAM OXALATE 20 MG PO TABS: 20.0000 mg | ORAL_TABLET | Freq: Every day | ORAL | 3 refills | Status: DC

## 2018-01-10 MED ORDER — AMPHETAMINE-DEXTROAMPHETAMINE 20 MG PO TABS
ORAL_TABLET | ORAL | 0 refills | Status: DC
Start: 2018-01-10 — End: 2018-04-11

## 2018-01-10 MED ORDER — AMPHETAMINE-DEXTROAMPHETAMINE 20 MG PO TABS: ORAL_TABLET | ORAL | 0 refills | Status: DC

## 2018-01-10 NOTE — Progress Notes (Signed)
OFFICE VISIT  01/10/2018   CC:  Chief Complaint  Patient presents with  . Follow-up    RCI    HPI:    Patient is a 57 y.o. Caucasian [name] who presents for f/u adult ADD, hypothyroidism, and f/u abnl CT chest.  ADD: helps pretty well with energy, focus, concentration, task completion.  Has been out of this x 1 week. Endorses pretty consistently depressed mood, excessive somnolence.  He is open to trial of increasing lexapro to Robert mg qd.  Hypoth, postsurgical: TSH over-suppressed last check 08/2017.  Levothyroxine dose decreased slightly to 200 mcg daily except 2 days a week he was to take only a 1/2 of 200 mcg tab. He is taking this as rx'd but sometimes doesn't take it on empty stomach.    CT chest 09/03/17 to f/u a pulm nodule: IMPRESSION: 1. Interval resolution of previously described nodule within the right upper lobe. 2. Interval development of patchy ground-glass opacities within the right middle and right lower lobes likely infectious or inflammatory in etiology. 3. Aortic Atherosclerosis (ICD10-I70.0).  Swallowing study done 09/27/17 showed signif swallowing dysfunction w/aspiration. It was felt he would benefit from PT for this, also don't eat in late evening before bed.   Past Medical History:  Diagnosis Date  . Adult ADHD   . Aspiration pneumonitis ([place]) 04/2017, 08/2017   Recurrent  . Constipation, chronic    Has caused irritative urinary/bladder symptoms in the past.  . Depression   . Dizziness 06/2011+   Noncontrast CT and noncontrast MRI both negative 06/2011 at [facility] ED visit.  . Elevated transaminase level    Liver biopsy revealed fatty liver  . Erectile dysfunction   . Facial fracture ([place]) 06/2011   Nondisplaced bilateral LeFort I fracture: no surgery required. (ENT, Dr. Wilburn Cornelia)   . Fatty liver disease, nonalcoholic   . GERD (gastroesophageal reflux disease)   . History of thyroid cancer 2010   Medullary carcinoma of thyroid. Thyroidectomy 08/2009 (Dr.  Aida Puffer at [facility]).  Needs periodic serum calcitonin monitoring.  Marland Kitchen Hyperlipidemia   . Hypothyroidism   . LVH (left ventricular hypertrophy)    by EKG criteria 06/2011.  Hx of heart murmur--Followed by cardiology at [facility]: EF's have been "stable" per cardio office note 12/29/10.  His echo 12/2012 showed normal LV size and wall thickness but EF 45-50% and global LV hypokinesis was found.  . Myotonic dystrophy ([place]) Dx'd 1996   Progressive muscle weakness and swallowing dysfunction.  Initial dx by Dr. Jannifer Franklin, neurologist, but subsequent annual specialist f/u has been with Dr. Ladon Applebaum [facility].  Progressive debilitation--PT referral 04/2017, pt quit PT 05/2017.  . Nephrolithiasis   . Obesity, Class I, BMI 30-34.9   . OSA (obstructive sleep apnea)    does not wear CPAP  . Pulmonary nodule, right 05/2017   RUL, 7 mm sub solid nodule---needs f/u noncontrast chest CT 3 mo (ordered)  . S/P colonoscopy    normal around 2010 per pt's wife    Past Surgical History:  Procedure Laterality Date  . EXTRACORPOREAL SHOCK WAVE LITHOTRIPSY    . THYROIDECTOMY  08/2009   for medullary thyroid cancer (Dr. Conley Canal at [facility] follows him for this).  . TRANSTHORACIC ECHOCARDIOGRAM  12/2012; 09/2017   2014-Normal LV size and wall thickness, LV EF 45-50%, with global hypokinesis of LV.  2018- EF 50-55%, normal wall motion, grd I DD, valves normal.    Outpatient Medications Prior to Visit  Medication Sig Dispense Refill  . acetaminophen (TYLENOL)  500 MG tablet Take 1,000 mg by mouth every 6 (six) hours as needed (pain).     Marland Kitchen levothyroxine (SYNTHROID, LEVOTHROID) 200 MCG tablet Take 1 tablet by mouth on Mon, Tues, Thurs, Fri and Sat and take 1/2 tablet (100 mcg) on Sun and Wed    . omeprazole (PRILOSEC) 40 MG capsule Take 1 capsule (40 mg total) by mouth daily. 90 capsule 3  . traZODone (DESYREL) 100 MG tablet TAKE 1 TABLET BY MOUTH AT BEDTIME AS NEEDED FOR SLEEP. 30 tablet 11  . amphetamine-dextroamphetamine  (ADDERALL) Robert MG tablet 1 tab po tid 90 tablet 0  . escitalopram (LEXAPRO) 10 MG tablet Take 1 tablet (10 mg total) by mouth daily. 90 tablet 1   No facility-administered medications prior to visit.     No Known Allergies  ROS As per HPI  PE: Blood pressure 116/81, pulse 86, temperature (!) 97.5 F (36.4 C), temperature source Oral, resp. rate 16, weight 197 lb (89.4 kg), SpO2 96 %. Body mass index is 31.8 kg/m.  Wt Readings from Last 2 Encounters:  01/10/18 197 lb (89.4 kg)  10/03/17 200 lb (90.7 kg)    Gen: alert, oriented x 4, affect pleasant but affect is flat, facial expression never changes.  Lucid thinking and conversation noted.  Speech is soft/weak. HEENT: PERRLA, EOMI.   Neck: no LAD, mass, or thyromegaly. CV: RRR, no m/r/g LUNGS: CTA bilat, nonlabored. NEURO: no tremor or tics noted on observation.  Coordination intact. CN 2-12 grossly intact bilaterally, strength 5/5 in all extremeties.  No ataxia. EXT: no clubbing, cyanosis, or edema.    LABS:  Lab Results  Component Value Date   TSH 0.01 (L) 09/07/2017   Lab Results  Component Value Date   WBC 3.7 (L) 02/20/2013   HGB 14.7 02/20/2013   HCT 43.3 02/20/2013   MCV 89.6 02/20/2013   PLT 206 02/20/2013   Lab Results  Component Value Date   CREATININE 0.90 02/09/2017   BUN 13 02/09/2017   NA 140 02/09/2017   K 5.5 slight hemolysis (H) 02/09/2017   CL 102 02/09/2017   CO2 32 02/09/2017   Lab Results  Component Value Date   ALT 23 02/09/2017   AST 25 02/09/2017   ALKPHOS 72 02/09/2017   BILITOT 0.4 02/09/2017   Lab Results  Component Value Date   CHOL 231 (H) 02/09/2017   Lab Results  Component Value Date   HDL 69.30 02/09/2017   Lab Results  Component Value Date   LDLCALC 130 (H) 02/09/2017   Lab Results  Component Value Date   TRIG 159.0 (H) 02/09/2017   Lab Results  Component Value Date   CHOLHDL 3 02/09/2017   Lab Results  Component Value Date   PSA 0.57 08/15/2012     IMPRESSION AND PLAN:  1) Swallowing dysfunction, hx of aspiration. He does some of the recommendations for eating already, but will reach out to speech therapy to see what else they recommend regarding outpt PT for this. No resp sx's at this time. Recent f/u visit with pt's myotonic dystrophy specialist at [facility] neuro---his specialist seemed to think he would benefit from seeing a pulmonologist, wife says neuro did office PFT and she can't recall result. Perhaps there was sign of obstructive or restrictive dz.  Wife sees Dr. Halford Chessman and says she will reach out to him to pursue this.  2) Adult ADD/chronic fatigue: related to his myotonic dystrophy and concurrent depression/dysthymia: The current medical regimen is effective;  continue  present plan and medications. I printed rx's for adderall 20mg , 1 tid, #90 today for this month, Feb 2019, and Mar 2019.  Appropriate fill on/after date was noted on each rx.  3) MDD, recurrent, still significantly impacted by this. Increase lexapro to 20mg  qd.  He declined counseling referral today.  4) Postsurgical hypothyroidism: recently over-suppressed.  Dose adjusted.  Recheck TSH today.  5) NAFLD: (bx-proven in the past).  CMET today.  His diet is still bad, no exercise. Encouraged improvement in both these things but he is not motivated to do so. Hep C screening done today.  An After Visit Summary was printed and given to the patient.  FOLLOW UP: Return in about 3 months (around 04/10/2018) for routine chronic illness f/u (30 min).  Signed:  Crissie Sickles, MD           01/10/2018

## 2018-01-11 ENCOUNTER — Other Ambulatory Visit: Payer: Self-pay | Admitting: Family Medicine

## 2018-01-11 LAB — COMPREHENSIVE METABOLIC PANEL
ALBUMIN: 4 g/dL (ref 3.5–5.2)
ALK PHOS: 105 U/L (ref 39–117)
ALT: 18 U/L (ref 0–53)
AST: 22 U/L (ref 0–37)
BUN: 13 mg/dL (ref 6–23)
CALCIUM: 9.8 mg/dL (ref 8.4–10.5)
CO2: 32 mEq/L (ref 19–32)
Chloride: 102 mEq/L (ref 96–112)
Creatinine, Ser: 0.74 mg/dL (ref 0.40–1.50)
GFR: 115.78 mL/min (ref >60.00)
Glucose, Bld: 87 mg/dL (ref 70–99)
POTASSIUM: 4.1 meq/L (ref 3.5–5.1)
Sodium: 142 mEq/L (ref 135–145)
TOTAL PROTEIN: 6.9 g/dL (ref 6.0–8.3)
Total Bilirubin: 0.5 mg/dL (ref 0.2–1.2)

## 2018-01-11 LAB — HEPATITIS C ANTIBODY
Hepatitis C Ab: NONREACTIVE
SIGNAL TO CUT-OFF: 0.03 (ref <1.00)

## 2018-01-11 LAB — TSH: TSH: <0.01 u[IU]/mL — ABNORMAL LOW (ref 0.35–4.50)

## 2018-01-11 MED ORDER — LEVOTHYROXINE SODIUM 150 MCG PO TABS: 150.0000 ug | ORAL_TABLET | Freq: Every day | ORAL | 1 refills | Status: DC

## 2018-01-12 ENCOUNTER — Other Ambulatory Visit: Payer: Self-pay | Admitting: *Deleted

## 2018-01-12 DIAGNOSIS — E890 Postprocedural hypothyroidism: Secondary | ICD-10-CM

## 2018-03-30 ENCOUNTER — Other Ambulatory Visit: Payer: Self-pay | Admitting: Family Medicine

## 2018-04-11 ENCOUNTER — Ambulatory Visit: Payer: 59 | Admitting: Family Medicine

## 2018-04-11 ENCOUNTER — Encounter: Payer: Self-pay | Admitting: Family Medicine

## 2018-04-11 VITALS — BP 124/77 | HR 80 | Temp 98.2°F | Resp 16 | Ht 66.0 in | Wt 202.4 lb

## 2018-04-11 DIAGNOSIS — F988 Other specified behavioral and emotional disorders with onset usually occurring in childhood and adolescence: Secondary | ICD-10-CM

## 2018-04-11 DIAGNOSIS — F411 Generalized anxiety disorder: Secondary | ICD-10-CM | POA: Diagnosis not present

## 2018-04-11 DIAGNOSIS — F330 Major depressive disorder, recurrent, mild: Secondary | ICD-10-CM

## 2018-04-11 DIAGNOSIS — R1310 Dysphagia, unspecified: Secondary | ICD-10-CM | POA: Diagnosis not present

## 2018-04-11 DIAGNOSIS — E890 Postprocedural hypothyroidism: Secondary | ICD-10-CM

## 2018-04-11 LAB — TSH: TSH: 0.24 u[IU]/mL — ABNORMAL LOW (ref 0.35–4.50)

## 2018-04-11 MED ORDER — AMPHETAMINE-DEXTROAMPHETAMINE 20 MG PO TABS: ORAL_TABLET | ORAL | 0 refills | Status: DC

## 2018-04-11 MED ORDER — FLUOXETINE HCL 20 MG PO TABS: 20.0000 mg | ORAL_TABLET | Freq: Every day | ORAL | 3 refills | Status: DC

## 2018-04-11 NOTE — Progress Notes (Signed)
OFFICE VISIT  04/11/2018   CC:  Chief Complaint  Patient presents with  . Follow-up    RCI   HPI:    Patient is a 57 y.o. Caucasian male with myotonic dystrophy who presents accompanied by his wife for 3 mo f/u adult ADD, chronic anxiety/depression, postoperative hypothyroidism, and hx of recurrent aspiration w/hx of aspiration pneumonitis.  Hx of aspiration: no periods of coughing/choking with eating or drinking.  Seems to be handling oral secretions fine. He has an appt with pulmonologist with [facility] 05/03/18.  Adult ADD/chronic fatigue: his level of alertness and focus/concentration is sufficiently helped by current adderall dosing. Denies any adverse effects from this medication.  Hypothyroidism: decreased dose of levothyroxine to 150 mcg qd after last TSH low. Tolerating new dose well, taking med correctly.  Dep/anx: he feels no significant improvement since we increased his lexapro to 20mg  qd dosing. He has failed wellbutrin and duloxetine trials in the past.  ROS: no fevers, no SOB, no CP, no palpitations, no diarrhea, no abd pains, no HAs, no n/v.  No LE swelling.  Past Medical History:  Diagnosis Date  . Adult ADHD   . Aspiration pneumonitis ([place]) 04/2017, 08/2017   Recurrent  . Constipation, chronic    Has caused irritative urinary/bladder symptoms in the past.  . Depression   . Dizziness 06/2011+   Noncontrast CT and noncontrast MRI both negative 06/2011 at [facility] ED visit.  . Elevated transaminase level    Liver biopsy revealed fatty liver  . Erectile dysfunction   . Facial fracture ([place]) 06/2011   Nondisplaced bilateral LeFort I fracture: no surgery required. (ENT, Dr. Wilburn Cornelia)   . Fatty liver disease, nonalcoholic   . GERD (gastroesophageal reflux disease)   . History of thyroid cancer 2010   Medullary carcinoma of thyroid. Thyroidectomy 08/2009 (Dr. Aida Puffer at [facility]).  Needs periodic serum calcitonin monitoring.  Marland Kitchen Hyperlipidemia   . Hypothyroidism   .  LVH (left ventricular hypertrophy)    by EKG criteria 06/2011.  Hx of heart murmur--Followed by cardiology at [facility]: EF's have been "stable" per cardio office note 12/29/10.  His echo 12/2012 showed normal LV size and wall thickness but EF 45-50% and global LV hypokinesis was found.  . Myotonic dystrophy ([place]) Dx'd 1996   Progressive muscle weakness and swallowing dysfunction.  Initial dx by Dr. Jannifer Franklin, neurologist, but subsequent annual specialist f/u has been with Dr. Ladon Applebaum [facility].  Progressive debilitation--PT referral 04/2017, pt quit PT 05/2017.  . Nephrolithiasis   . Obesity, Class I, BMI 30-34.9   . OSA (obstructive sleep apnea)    does not wear CPAP  . Pulmonary nodule, right 05/2017   RUL, 7 mm sub solid nodule.  Resolution noted on f/u CT 08/2017.  . S/P colonoscopy    normal around 2010 per pt's wife    Past Surgical History:  Procedure Laterality Date  . EXTRACORPOREAL SHOCK WAVE LITHOTRIPSY    . THYROIDECTOMY  08/2009   for medullary thyroid cancer (Dr. Conley Canal at [facility] follows him for this).  . TRANSTHORACIC ECHOCARDIOGRAM  12/2012; 09/2017   2014-Normal LV size and wall thickness, LV EF 45-50%, with global hypokinesis of LV.  2018- EF 50-55%, normal wall motion, grd I DD, valves normal.    Outpatient Medications Prior to Visit  Medication Sig Dispense Refill  . acetaminophen (TYLENOL) 500 MG tablet Take 1,000 mg by mouth every 6 (six) hours as needed (pain).     Marland Kitchen levothyroxine (SYNTHROID, LEVOTHROID) 150 MCG tablet TAKE  1 TABLET BY MOUTH ONCE DAILY 30 tablet 0  . omeprazole (PRILOSEC) 40 MG capsule Take 1 capsule (40 mg total) by mouth daily. 90 capsule 3  . traZODone (DESYREL) 100 MG tablet TAKE 1 TABLET BY MOUTH AT BEDTIME AS NEEDED FOR SLEEP. 30 tablet 11  . amphetamine-dextroamphetamine (ADDERALL) 20 MG tablet 1 tab po tid 90 tablet 0  . escitalopram (LEXAPRO) 20 MG tablet Take 1 tablet (20 mg total) by mouth daily. 30 tablet 3   No facility-administered medications  prior to visit.     No Known Allergies  ROS As per HPI  PE: Blood pressure 124/77, pulse 80, temperature 98.2 F (36.8 C), temperature source Oral, resp. rate 16, height 5\' 6"  (1.676 m), weight 202 lb 6 oz (91.8 kg), SpO2 97 %. Gen: Alert, answers questions fine.  He is a bit unkempt as per his usual appearance.  Patient is oriented to person, place, time, and situation. CV: RRR, no m/r/g.   LUNGS: CTA bilat, nonlabored resps, good aeration in all lung fields. EXT: no clubbing, cyanosis, or edema.     LABS:  Lab Results  Component Value Date   TSH <0.01 Repeated and verified X2. (L) 01/10/2018   Lab Results  Component Value Date   WBC 3.7 (L) 02/20/2013   HGB 14.7 02/20/2013   HCT 43.3 02/20/2013   MCV 89.6 02/20/2013   PLT 206 02/20/2013   Lab Results  Component Value Date   CREATININE 0.74 01/10/2018   BUN 13 01/10/2018   NA 142 01/10/2018   K 4.1 01/10/2018   CL 102 01/10/2018   CO2 32 01/10/2018   Lab Results  Component Value Date   ALT 18 01/10/2018   AST 22 01/10/2018   ALKPHOS 105 01/10/2018   BILITOT 0.5 01/10/2018   Lab Results  Component Value Date   CHOL 231 (H) 02/09/2017   Lab Results  Component Value Date   HDL 69.30 02/09/2017   Lab Results  Component Value Date   LDLCALC 130 (H) 02/09/2017   Lab Results  Component Value Date   TRIG 159.0 (H) 02/09/2017   Lab Results  Component Value Date   CHOLHDL 3 02/09/2017     IMPRESSION AND PLAN:  1) Adult ADD: The current medical regimen is effective;  continue present plan and medications. I printed rx's for adderal IR 20mg , 1 tid, #90 today for this month, May 2019, and June 2019.  Appropriate fill on/after date was noted on each rx.  2) GAD/depression: chronic, unchanged with trials of lexapro, duloxetine, and wellbutrin. Will stop lexapro today and change him to a trial of fluoxetine 20mg  qd.  3) Postoperative hypothyroidism: oversuppressed last check.  Dose decreased to 150 mcg  qd, recheck TSH today.  4) Swallowing dysfunction: related to his myotonic dystrophy.  Seems to be fine with this over the last 4-5 months at least. Hx of aspiration pneumonitis. He has appt arranged for pulmonology eval with Dr. Chase Caller 05/03/18.  An After Visit Summary was printed and given to the patient.  FOLLOW UP: Return in about 3 months (around 07/11/2018) for annual CPE (fasting).  Signed:  Crissie Sickles, MD           04/11/2018

## 2018-05-03 ENCOUNTER — Ambulatory Visit: Payer: 59 | Admitting: Internal Medicine

## 2018-05-03 ENCOUNTER — Encounter: Payer: Self-pay | Admitting: Internal Medicine

## 2018-05-03 VITALS — BP 112/70 | HR 94 | Ht 66.0 in | Wt 206.0 lb

## 2018-05-03 DIAGNOSIS — R942 Abnormal results of pulmonary function studies: Secondary | ICD-10-CM

## 2018-05-03 DIAGNOSIS — R918 Other nonspecific abnormal finding of lung field: Secondary | ICD-10-CM

## 2018-05-03 DIAGNOSIS — R0689 Other abnormalities of breathing: Secondary | ICD-10-CM | POA: Diagnosis not present

## 2018-05-03 DIAGNOSIS — G7111 Myotonic muscular dystrophy: Secondary | ICD-10-CM | POA: Diagnosis not present

## 2018-05-03 NOTE — Progress Notes (Signed)
   Subjective:    Patient ID: [name], male    DOB: May 15, 1961, 57 y.o.   MRN: [id number]  HPI    Review of Systems  Constitutional: Negative for fever and unexpected weight change.  HENT: Positive for dental problem, sinus pressure and trouble swallowing. Negative for congestion, ear pain, nosebleeds, postnasal drip, rhinorrhea, sneezing and sore throat.   Eyes: Negative for redness and itching.  Respiratory: Positive for cough and shortness of breath. Negative for chest tightness and wheezing.   Cardiovascular: Negative for palpitations and leg swelling.  Gastrointestinal: Negative for nausea and vomiting.  Genitourinary: Negative for dysuria.  Musculoskeletal: Negative for joint swelling.  Skin: Negative for rash.  Allergic/Immunologic: Negative.  Negative for environmental allergies, food allergies and immunocompromised state.  Neurological: Positive for headaches.  Hematological: Does not bruise/bleed easily.  Psychiatric/Behavioral: Positive for dysphoric mood. The patient is nervous/anxious.        Objective:   Physical Exam        Assessment & Plan:

## 2018-05-03 NOTE — Progress Notes (Signed)
Subjective:     Patient ID: [name], male   DOB: 1961/11/22, 57 y.o.   MRN: [id number] PCP Tammi Sou, MD  HPI  IOV 05/03/2018  Chief Complaint  Patient presents with  . Consult    Referred by Dr. Tillman Abide due to mytonic dystrophy, FVC 46%. Pt had pna twice 04/2017 and around 05/2017. Pt has c/o cough,  SOB with minimal activity   57 year old [name] who is accompanied by his wife [name].  The referral is from Dr. Tillman Abide neurologist neuromuscular specialist at [facility].  Patient has a long-standing multi-decade history of neuromuscular disease myotonic dystrophy.  He has an elder brother [name] who is 61 years older than him with similar neuromuscular issues and another brother 32 years older than him with predominantly cardiac manifestation.  His eldest brother [name] is now on nocturnal noninvasive ventilator machine for the last 6 months or so.  History is given by his wife and in review of the care everywhere notes from [facility].  Originally diagnosed with myotonic dystrophy nineteen 92/1993.  Very slowly progressive since the onset.  At this point in time patient can still climb a flight of stairs although he will get short of breath and can self-care like take a bath and also eat without any aspiration [although he has been seeing speech therapy for many years].  At medical offices he normally will allow the wife to give a full history because his voice is extremely thick and he is slow in giving a history.  The wife does admit slow progression and myotonic dystrophy.  When he does exert and get fatigued he also gets short of breath but at all times that he denies any cough orthopnea proximal nocturnal dyspnea or wheezing or edema.  Patient does not have chronic systolic heart failure and in the fall 2018 when they visited Dr. Crissie Sickles echocardiogram showed improvement in ejection fraction to  55%.  It appears the first neuromuscular evaluation by Dr. Tillman Abide at [facility] was done in December 2018.  I reviewed these notes and confirmed the same.  At this time FVC was noted to be 46% in the clinic and therefore Dr. Tillman Abide recommended pulmonary evaluation and therefore he is here.  Review of the chart shows that he has had a blood gas back in [facility] with [facility] with hypercapnia and this bicarbonate on serum chemistries have continued to be high.  There is daytime fatigue and also last year in May 2018 he had pneumonia.  Although no overt aspiration.  He did have follow-up CT chest in September 2018 that I personally visualized and looks fairly benign other than basal atelectasis/scarring.  There is some interval development of middle lobe infiltrates.  Apparently primary care physician recommended follow-up chest x-ray but patient refused.  There is no follow-up chest x-ray for this.   Results for Rylee, Landin A "JEFF" (MRN [id number]) as of 05/03/2018 15:16  Ref. Range 11/22/2007 19:41  Sample type Unknown VENOUS  pH, Ven Unknown 7.353 (H)  pCO2, Ven Unknown 58.6 (H)  TCO2 Unknown 34  Acid-Base Excess Unknown 5.0 (H)  Bicarbonate Unknown 32.5 (H)   Results for [name], Robert A "JEFF" (MRN [id number]) as of 05/03/2018 15:16  Ref. Range 06/05/2015 14:43 11/18/2015 11:32 02/09/2017 11:01 01/10/2018 14:20  CO2 Latest Ref Range: 19 - 32 mEq/L 29 32 32 32   Results for [name], Robert A "JEFF" (MRN [id number]) as  of 05/03/2018 15:16  Ref. Range 01/10/2018 14:20  Creatinine Latest Ref Range: 0.40 - 1.50 mg/dL 0.74   Results for [name], Robert A "JEFF" (MRN [id number]) as of 05/03/2018 15:16  Ref. Range 02/20/2013 16:06  Hemoglobin Latest Ref Range: 13.0 - 17.0 g/dL 14.7   CT chest Sep 2018 - personally visualized IMPRESSION: 1. Interval resolution of previously described nodule within the right upper lobe. 2. Interval development of patchy ground-glass opacities within  the right middle and right lower lobes likely infectious or inflammatory in etiology. 3. Aortic Atherosclerosis (ICD10-I70.0).   Electronically Signed   By: Lovey Newcomer M.D.   On: 09/03/2017 16:44    has a past medical history of Adult ADHD, Aspiration pneumonitis ([place]) (04/2017, 08/2017), Constipation, chronic, Depression, Dizziness (06/2011+), Elevated transaminase level, Erectile dysfunction, Facial fracture ([place]) (06/2011), Fatty liver disease, nonalcoholic, GERD (gastroesophageal reflux disease), History of thyroid cancer (2010), Hyperlipidemia, Hypothyroidism, LVH (left ventricular hypertrophy), Myotonic dystrophy ([place]) (Dx'd 1996), Nephrolithiasis, Obesity, Class I, BMI 30-34.9, OSA (obstructive sleep apnea), Pulmonary nodule, right (05/2017), and S/P colonoscopy.   reports that he has never smoked. He has never used smokeless tobacco.  Past Surgical History:  Procedure Laterality Date  . EXTRACORPOREAL SHOCK WAVE LITHOTRIPSY    . THYROIDECTOMY  08/2009   for medullary thyroid cancer (Dr. Conley Canal at [facility] follows him for this).  . TRANSTHORACIC ECHOCARDIOGRAM  12/2012; 09/2017   2014-Normal LV size and wall thickness, LV EF 45-50%, with global hypokinesis of LV.  2018- EF 50-55%, normal wall motion, grd I DD, valves normal.    No Known Allergies  Immunization History  Administered Date(s) Administered  . Influenza Inj Mdck Quad With Preservative 12/05/2017  . Influenza Split 12/07/2011, 09/13/2012  . Influenza,inj,Quad PF,6+ Mos 09/05/2013, 09/08/2014, 11/18/2015  . Pneumococcal Polysaccharide-23 12/26/2008, 06/05/2015  . Tdap 07/08/2011    Family History  Problem Relation Age of Onset  . Arthritis Mother   . Hypertension Mother   . Alcohol abuse Father   . Arthritis Father   . Hypertension Father   . Colon cancer Father   . Prostate cancer Father      Current Outpatient Medications:  .  acetaminophen (TYLENOL) 500 MG tablet, Take 1,000 mg by mouth every 6 (six)  hours as needed (pain). , Disp: , Rfl:  .  amphetamine-dextroamphetamine (ADDERALL) 20 MG tablet, 1 tab po tid, Disp: 90 tablet, Rfl: 0 .  FLUoxetine (PROZAC) 20 MG tablet, Take 1 tablet (20 mg total) by mouth daily., Disp: 30 tablet, Rfl: 3 .  levothyroxine (SYNTHROID, LEVOTHROID) 150 MCG tablet, TAKE 1 TABLET BY MOUTH ONCE DAILY, Disp: 30 tablet, Rfl: 0 .  omeprazole (PRILOSEC) 40 MG capsule, Take 1 capsule (40 mg total) by mouth daily., Disp: 90 capsule, Rfl: 3 .  traZODone (DESYREL) 100 MG tablet, TAKE 1 TABLET BY MOUTH AT BEDTIME AS NEEDED FOR SLEEP., Disp: 30 tablet, Rfl: 11    Review of Systems     Objective:   Physical Exam  Today's Vitals   05/03/18 1519  BP: 112/70  Pulse: 94  SpO2: 100%  Weight: 206 lb (93.4 kg)  Height: 5\' 6"  (1.676 m)    Estimated body mass index is 33.25 kg/m as calculated from the following:   Height as of this encounter: 5\' 6"  (1.676 m).   Weight as of this encounter: 206 lb (93.4 kg).      Assessment:       ICD-10-CM   1. Myotonic dystrophy ([place]) G71.11 Pulmonary function test  2.  Abnormal PFT R94.2   3. Hypercapnia R06.89    Based on the history of myotonic dystrophy and slowly progressive neurologic symptoms and a brother who is on noninvasive nocturnal ventilation for the last 6 months and patient's chart review showing hypercapnia and chronic elevation in bicarbonate and serum chemistries I suspect there is some element of chronic hypercapnic respiratory failure going on.  There is also some daytime fatigue that will fit him with this.  Based on the history that does not seem to be any intrinsic parenchymal lung disease.  There is some pulmonary infiltrates on the CT chest September 2018 but I think this is nonspecific.  Currently he is otherwise asymptomatic from a respiratory standpoint other than exertional shortness of breath and fatigue related to his neuromuscular weakness.     Plan:      First I want to get a blood gas and if  this shows hypercapnia that I want to institute noninvasive ventilation at night.  If this disease progresses he will ultimately end up needing a tracheostomy.  At some point in time in the future I will try to get some imaging but at this point he is somewhat reluctant  Plan  Do [name]  - if CO2 high then can consider similar night ventilator machine like your brother Please call with brother's pulmonllogist name and make/model of night vent machine  followup  - based on abg results which we will call about   Dr. Brand Males, M.D., [facility].C.P Pulmonary and Critical Care Medicine Staff Physician, [place] Director - Interstitial Lung Disease  Program  Pulmonary [place] at [place], [place], [address]  Pager: [phone], If no answer or between  15:00h - 7:00h: call [phone] Telephone: [phone]

## 2018-05-03 NOTE — Patient Instructions (Addendum)
ICD-10-CM   1. Myotonic dystrophy ([place]) G71.11 Pulmonary function test  2. Abnormal PFT R94.2   3. Hypercapnia R06.89   4. Pulmonary infiltrates R91.8     Do [name]  - if CO2 high then can consider similar night ventilator machine like your brother Please call with brother's pulmonllogist name and make/model of night vent machine  followup  - based on abg results which we will call about -Consider imaging at follow-up

## 2018-05-08 ENCOUNTER — Ambulatory Visit (HOSPITAL_COMMUNITY)
Admission: RE | Admit: 2018-05-08 | Discharge: 2018-05-08 | Disposition: A | Discharge status: Home / Self Care | Payer: 59 | Source: Ambulatory Visit | Attending: Internal Medicine | Admitting: Internal Medicine

## 2018-05-08 DIAGNOSIS — G7111 Myotonic muscular dystrophy: Secondary | ICD-10-CM | POA: Insufficient documentation

## 2018-05-08 LAB — BLOOD GAS, ARTERIAL
ACID-BASE EXCESS: 1.6 mmol/L (ref 0.0–2.0)
Bicarbonate: 25.8 mmol/L (ref 20.0–28.0)
Drawn by: 441261
FIO2: 21
O2 SAT: 95.7 %
PATIENT TEMPERATURE: 98.6
PO2 ART: 89.2 mmHg (ref 83.0–108.0)
pCO2 arterial: 40.8 mmHg (ref 32.0–48.0)
pH, Arterial: 7.417 (ref 7.350–7.450)

## 2018-05-14 ENCOUNTER — Other Ambulatory Visit: Payer: Self-pay | Admitting: Family Medicine

## 2018-05-16 ENCOUNTER — Encounter: Payer: Self-pay | Admitting: Family Medicine

## 2018-05-18 ENCOUNTER — Telehealth: Payer: Self-pay | Admitting: Internal Medicine

## 2018-05-18 DIAGNOSIS — R942 Abnormal results of pulmonary function studies: Secondary | ICD-10-CM

## 2018-05-18 DIAGNOSIS — G7111 Myotonic muscular dystrophy: Secondary | ICD-10-CM

## 2018-05-18 DIAGNOSIS — R0689 Other abnormalities of breathing: Secondary | ICD-10-CM

## 2018-05-18 NOTE — Telephone Encounter (Signed)
  abg is normal. So no need for bipap/non invasive vent at night  Plan Check ONO on room air   Dr. Brand Males, M.D., [facility].C.P Pulmonary and Critical Care Medicine Staff Physician, [place] Director - Interstitial Lung Disease  Program  Pulmonary [place] at [place], [place], [address]  Pager: [phone], If no answer or between  15:00h - 7:00h: call [phone] Telephone: [phone]     Results for [name], Robert A "JEFF" (MRN [id number]) as of 05/18/2018 16:16  Ref. Range 05/08/2018 13:00  FIO2 Unknown 21.00  pH, Arterial Latest Ref Range: 7.350 - 7.450  7.417  pCO2 arterial Latest Ref Range: 32.0 - 48.0 mmHg 40.8  pO2, Arterial Latest Ref Range: 83.0 - 108.0 mmHg 89.2

## 2018-05-23 NOTE — Telephone Encounter (Signed)
lmtcb for pt. Will order ONO after speaking to pt.

## 2018-05-30 NOTE — Telephone Encounter (Signed)
Pt wife is returning call. Cb is [phone].

## 2018-05-31 NOTE — Telephone Encounter (Signed)
Called and spoke with pt's spouse [name] letting her know pt's [name] results and that pt does not need bipap or trilogy vent at bedtime.  [name] expressed understanding. I stated to her we were going to have pt do ONO so that way we can monitor his O2 levels while sleeping to see if he qualifies for O2 overnight.  Order placed for ONO to be performed. Nothing further needed at this time.

## 2018-06-29 ENCOUNTER — Other Ambulatory Visit: Payer: Self-pay | Admitting: Family Medicine

## 2018-07-04 NOTE — Telephone Encounter (Signed)
Rx RF'd just now.-thx

## 2018-07-12 ENCOUNTER — Other Ambulatory Visit: Payer: Self-pay | Admitting: Family Medicine

## 2018-07-17 ENCOUNTER — Ambulatory Visit (INDEPENDENT_AMBULATORY_CARE_PROVIDER_SITE_OTHER): Payer: 59 | Admitting: Family Medicine

## 2018-07-17 ENCOUNTER — Encounter: Payer: Self-pay | Admitting: Family Medicine

## 2018-07-17 VITALS — BP 103/69 | HR 85 | Temp 97.7°F | Resp 16 | Ht 67.0 in | Wt 202.2 lb

## 2018-07-17 DIAGNOSIS — Z79899 Other long term (current) drug therapy: Secondary | ICD-10-CM | POA: Diagnosis not present

## 2018-07-17 DIAGNOSIS — Z23 Encounter for immunization: Secondary | ICD-10-CM

## 2018-07-17 DIAGNOSIS — Z0184 Encounter for antibody response examination: Secondary | ICD-10-CM | POA: Diagnosis not present

## 2018-07-17 DIAGNOSIS — Z125 Encounter for screening for malignant neoplasm of prostate: Secondary | ICD-10-CM | POA: Diagnosis not present

## 2018-07-17 DIAGNOSIS — E669 Obesity, unspecified: Secondary | ICD-10-CM | POA: Diagnosis not present

## 2018-07-17 DIAGNOSIS — E039 Hypothyroidism, unspecified: Secondary | ICD-10-CM | POA: Diagnosis not present

## 2018-07-17 DIAGNOSIS — Z0000 Encounter for general adult medical examination without abnormal findings: Secondary | ICD-10-CM | POA: Diagnosis not present

## 2018-07-17 DIAGNOSIS — F988 Other specified behavioral and emotional disorders with onset usually occurring in childhood and adolescence: Secondary | ICD-10-CM | POA: Diagnosis not present

## 2018-07-17 LAB — CBC WITH DIFFERENTIAL/PLATELET
BASOS PCT: 1.1 % (ref 0.0–3.0)
Basophils Absolute: 0.1 10*3/uL (ref 0.0–0.1)
EOS PCT: 1.6 % (ref 0.0–5.0)
Eosinophils Absolute: 0.1 10*3/uL (ref 0.0–0.7)
HEMATOCRIT: 48.9 % (ref 39.0–52.0)
Hemoglobin: 16.2 g/dL (ref 13.0–17.0)
LYMPHS PCT: 29.5 % (ref 12.0–46.0)
Lymphs Abs: 1.6 10*3/uL (ref 0.7–4.0)
MCHC: 33.1 g/dL (ref 30.0–36.0)
MCV: 93.1 fl (ref 78.0–100.0)
MONOS PCT: 7 % (ref 3.0–12.0)
Monocytes Absolute: 0.4 10*3/uL (ref 0.1–1.0)
Neutro Abs: 3.2 10*3/uL (ref 1.4–7.7)
Neutrophils Relative %: 60.8 % (ref 43.0–77.0)
PLATELETS: 188 10*3/uL (ref 150.0–400.0)
RBC: 5.25 Mil/uL (ref 4.22–5.81)
RDW: 15.9 % — AB (ref 11.5–15.5)
WBC: 5.3 10*3/uL (ref 4.0–10.5)

## 2018-07-17 LAB — COMPREHENSIVE METABOLIC PANEL
ALT: 17 U/L (ref 0–53)
AST: 18 U/L (ref 0–37)
Albumin: 4.1 g/dL (ref 3.5–5.2)
Alkaline Phosphatase: 106 U/L (ref 39–117)
BILIRUBIN TOTAL: 0.6 mg/dL (ref 0.2–1.2)
BUN: 13 mg/dL (ref 6–23)
CHLORIDE: 99 meq/L (ref 96–112)
CO2: 29 meq/L (ref 19–32)
Calcium: 10 mg/dL (ref 8.4–10.5)
Creatinine, Ser: 0.95 mg/dL (ref 0.40–1.50)
GFR: 86.62 mL/min (ref >60.00)
Glucose, Bld: 82 mg/dL (ref 70–99)
POTASSIUM: 3.8 meq/L (ref 3.5–5.1)
SODIUM: 139 meq/L (ref 135–145)
Total Protein: 7.5 g/dL (ref 6.0–8.3)

## 2018-07-17 LAB — LIPID PANEL
CHOL/HDL RATIO: 3
Cholesterol: 233 mg/dL — ABNORMAL HIGH (ref 0–200)
HDL: 72 mg/dL (ref >39.00)
LDL CALC: 138 mg/dL — AB (ref 0–99)
NonHDL: 161.01
Triglycerides: 117 mg/dL (ref 0.0–149.0)
VLDL: 23.4 mg/dL (ref 0.0–40.0)

## 2018-07-17 LAB — TSH: TSH: 6.17 u[IU]/mL — ABNORMAL HIGH (ref 0.35–4.50)

## 2018-07-17 LAB — PSA: PSA: 0.38 ng/mL (ref 0.10–4.00)

## 2018-07-17 MED ORDER — AMPHETAMINE-DEXTROAMPHETAMINE 20 MG PO TABS: ORAL_TABLET | ORAL | 0 refills | Status: DC

## 2018-07-17 NOTE — Progress Notes (Signed)
Office Note 07/17/2018  CC:  Chief Complaint  Patient presents with  . Annual Exam    Pt is not fasting.    HPI:  [name] is a 57 y.o. White male who is here accompanied by his wife for annual health maintenance exam.  Exercise: a little bit of walking. Diet: soft diet, needs to eat more veggies.  Eats too much high sodium, canned food. Dental: he is behind in preventatives. Eyes: exam about 2 yrs ago.  He is due.   Past Medical History:  Diagnosis Date  . Adult ADHD   . Aspiration pneumonitis ([place]) 04/2017, 08/2017   Recurrent  . Chronic respiratory failure ([place])    As of 04/2018, Dr. Chase Caller suspects hypercapnic RF--but [name] normal.  May eventually need nocturnal noninvasive ventilation.  Ultimately, if progresses enough pt will need trach.  . Constipation, chronic    Has caused irritative urinary/bladder symptoms in the past.  . Depression   . Dizziness 06/2011+   Noncontrast CT and noncontrast MRI both negative 06/2011 at [facility] ED visit.  . Elevated transaminase level    Liver biopsy revealed fatty liver  . Erectile dysfunction   . Facial fracture ([place]) 06/2011   Nondisplaced bilateral [place] I fracture: no surgery required. (ENT, Dr. Wilburn Cornelia)   . Fatty liver disease, nonalcoholic   . GERD (gastroesophageal reflux disease)   . History of thyroid cancer 2010   Medullary carcinoma of thyroid. Thyroidectomy 08/2009 (Dr. Aida Puffer at [facility]).  Needs periodic serum calcitonin monitoring.  Marland Kitchen Hyperlipidemia   . Hypothyroidism   . LVH (left ventricular hypertrophy)    by EKG criteria 06/2011.  Hx of heart murmur--Followed by cardiology at [facility]: EF's have been "stable" per cardio office note 12/29/10.  His echo 12/2012 showed normal LV size and wall thickness but EF 45-50% and global LV hypokinesis was found.  . Myotonic dystrophy ([place]) Dx'd 1996   Progressive muscle weakness and swallowing dysfunction.  Initial dx by Dr. Jannifer Franklin, neurologist, but subsequent annual  specialist f/u has been with Dr. Ladon [facility]baum [facility].  Progressive debilitation--PT referral 04/2017, pt quit PT 05/2017.  . Nephrolithiasis   . Obesity, Class I, BMI 30-34.9   . OSA (obstructive sleep apnea)    does not wear CPAP  . Pulmonary nodule, right 05/2017   RUL, 7 mm sub solid nodule.  Resolution noted on f/u CT 08/2017.    Past Surgical History:  Procedure Laterality Date  . COLONOSCOPY  09/13/2007   normal (Dr. Deatra Ina).  . EXTRACORPOREAL SHOCK WAVE LITHOTRIPSY    . THYROIDECTOMY  08/2009   for medullary thyroid cancer (Dr. Conley Canal at [facility] follows him for this).  . TRANSTHORACIC ECHOCARDIOGRAM  12/2012; 09/2017   2014-Normal LV size and wall thickness, LV EF 45-50%, with global hypokinesis of LV.  2018- EF 50-55%, normal wall motion, grd I DD, valves normal.    Family History  Problem Relation Age of Onset  . Arthritis Mother   . Hypertension Mother   . Alcohol abuse Father   . Arthritis Father   . Hypertension Father   . Colon cancer Father   . Prostate cancer Father     Social History   Socioeconomic History  . Marital status: Married    Spouse name: Not on file  . Number of children: Not on file  . Years of education: Not on file  . Highest education level: Not on file  Occupational History  . Not on file  Social Needs  .  Financial resource strain: Not on file  . Food insecurity:    Worry: Not on file    Inability: Not on file  . Transportation needs:    Medical: Not on file    Non-medical: Not on file  Tobacco Use  . Smoking status: Never Smoker  . Smokeless tobacco: Never Used  Substance and Sexual Activity  . Alcohol use: Yes    Comment: rare beer  . Drug use: No  . Sexual activity: Not on file  Lifestyle  . Physical activity:    Days per week: Not on file    Minutes per session: Not on file  . Stress: Not on file  Relationships  . Social connections:    Talks on phone: Not on file    Gets together: Not on file    Attends religious  service: Not on file    Active member of club or organization: Not on file    Attends meetings of clubs or organizations: Not on file    Relationship status: Not on file  . Intimate partner violence:    Fear of current or ex partner: Not on file    Emotionally abused: Not on file    Physically abused: Not on file    Forced sexual activity: Not on file  Other Topics Concern  . Not on file  Social History Narrative   Married, one son [name]).   Occupation: Market researcher, currently unemployed, working on appeal for medical disability as of 11/2011.  No tob or drugs.  Drinks alcohol socially.    Outpatient Medications Prior to Visit  Medication Sig Dispense Refill  . acetaminophen (TYLENOL) 500 MG tablet Take 1,000 mg by mouth every 6 (six) hours as needed (pain).     Marland Kitchen FLUoxetine (PROZAC) 20 MG tablet Take 1 tablet (20 mg total) by mouth daily. 30 tablet 3  . levothyroxine (SYNTHROID, LEVOTHROID) 150 MCG tablet TAKE 1 TABLET BY MOUTH ONCE DAILY EXCEPT FOR WEDNESDAY AND FRIDAYS SKIP DOSE 30 tablet 0  . omeprazole (PRILOSEC) 40 MG capsule Take 1 capsule (40 mg total) by mouth daily. 90 capsule 3  . traZODone (DESYREL) 100 MG tablet TAKE 1 TABLET BY MOUTH AT BEDTIME AS NEEDED FOR SLEEP. 30 tablet 11  . amphetamine-dextroamphetamine (ADDERALL) 20 MG tablet 1 tab po tid 90 tablet 0   No facility-administered medications prior to visit.     No Known Allergies  ROS Review of Systems  Constitutional: Positive for fatigue (chronic). Negative for appetite change, chills and fever.  HENT: Negative for congestion, dental problem, ear pain and sore throat.   Eyes: Negative for discharge, redness and visual disturbance.  Respiratory: Negative for cough, chest tightness, shortness of breath and wheezing.   Cardiovascular: Negative for chest pain, palpitations and leg swelling.  Gastrointestinal: Negative for abdominal pain, blood in stool, diarrhea, nausea and vomiting.  Genitourinary:  Negative for difficulty urinating, dysuria, flank pain, frequency, hematuria and urgency.  Musculoskeletal: Negative for arthralgias, back pain, joint swelling, myalgias and neck stiffness.  Skin: Negative for pallor and rash.  Neurological: Positive for weakness (generalized--chronic due to Myotonic dystrophy). Negative for dizziness, speech difficulty and headaches.  Hematological: Negative for adenopathy. Does not bruise/bleed easily.  Psychiatric/Behavioral: Negative for confusion and sleep disturbance. The patient is not nervous/anxious.     PE; Blood pressure 103/69, pulse 85, temperature 97.7 F (36.5 C), temperature source Oral, resp. rate 16, height 5\' 7"  (1.702 m), weight 202 lb 4 oz (91.7 kg), SpO2 100 %. Body  mass index is 31.68 kg/m.  Gen: Alert, mildly disheveled appearing as per his usual.  NAD.  Patient is oriented to person, place, time, and situation. AFFECT: pleasant, lucid thought and speech.   ENT: Ears: EACs clear, normal epithelium.  TMs with good light reflex and landmarks bilaterally.  Eyes: no injection, icteris, swelling, or exudate.  EOMI, PERRLA. Nose: no drainage or turbinate edema/swelling.  No injection or focal lesion.  Mouth: lips without lesion/swelling.  Oral mucosa pink and moist.  Dentition intact and without obvious caries or gingival swelling.  Oropharynx without erythema, exudate, or swelling.  Neck: supple/nontender.  No LAD, mass, or TM.  Carotid pulses 2+ bilaterally, without bruits. CV: RRR, no m/r/g.   LUNGS: CTA bilat, nonlabored resps, good aeration in all lung fields. ABD: soft, NT, ND, BS normal.  No hepatospenomegaly or mass.  No bruits. EXT: no clubbing, cyanosis, or edema.  Musculoskeletal: no joint swelling, erythema, warmth, or tenderness.  ROM of all joints intact. Diffuse muscular weakness. Skin - no sores or suspicious lesions or rashes or color changes Rectal exam: negative without mass, lesions or tenderness, PROSTATE EXAM: smooth  and symmetric without nodules or tenderness.   Pertinent labs:  Lab Results  Component Value Date   TSH 0.24 (L) 04/11/2018   Lab Results  Component Value Date   WBC 3.7 (L) 02/20/2013   HGB 14.7 02/20/2013   HCT 43.3 02/20/2013   MCV 89.6 02/20/2013   PLT 206 02/20/2013   Lab Results  Component Value Date   CREATININE 0.74 01/10/2018   BUN 13 01/10/2018   NA 142 01/10/2018   K 4.1 01/10/2018   CL 102 01/10/2018   CO2 32 01/10/2018   Lab Results  Component Value Date   ALT 18 01/10/2018   AST 22 01/10/2018   ALKPHOS 105 01/10/2018   BILITOT 0.5 01/10/2018   Lab Results  Component Value Date   CHOL 231 (H) 02/09/2017   Lab Results  Component Value Date   HDL 69.30 02/09/2017   Lab Results  Component Value Date   LDLCALC 130 (H) 02/09/2017   Lab Results  Component Value Date   TRIG 159.0 (H) 02/09/2017   Lab Results  Component Value Date   CHOLHDL 3 02/09/2017   Lab Results  Component Value Date   PSA 0.57 08/15/2012     ASSESSMENT AND PLAN:   Health maintenance exam: Reviewed age and gender appropriate health maintenance issues (prudent diet, regular exercise, health risks of tobacco and excessive alcohol, use of seatbelts, fire alarms in home, use of sunscreen).  Also reviewed age and gender appropriate health screening as well as vaccine recommendations. Vaccines: UTD.  Shingrix discussed--he will get #1 today. Labs: fasting HP, PSA. Prostate ca screening: DRE normal today , PSA. Colon ca screening: normal colonoscopy 2008.  He is overdue for 10 yr repeat ([place])-->pt considering repeat and will arrange with [facility] if he decides to do this.  Adult ADD/chronic fatigue: eRx'd 3 adderall rx's today as per his usual: one 20 mg adderall tid, #90. Appropriate fill on/after date was noted on each rx.  An After Visit Summary was printed and given to the patient.  FOLLOW UP:  Return in about 3 months (around 10/17/2018) for routine chronic illness  f/u.  Signed:  Crissie Sickles, MD           07/17/2018

## 2018-07-17 NOTE — Addendum Note (Signed)
Addended by: Gordy Councilman on: 07/17/2018 09:40 AM   Modules accepted: Orders

## 2018-07-17 NOTE — Patient Instructions (Signed)

## 2018-07-18 LAB — MEASLES/MUMPS/RUBELLA IMMUNITY
MUMPS IGG: 25.2 [AU]/ml
Rubella: 6.55 index
Rubeola IgG: 282 AU/mL

## 2018-09-14 ENCOUNTER — Other Ambulatory Visit: Payer: Self-pay | Admitting: Family Medicine

## 2018-09-20 ENCOUNTER — Ambulatory Visit: Payer: 59

## 2018-09-24 ENCOUNTER — Other Ambulatory Visit: Payer: Self-pay | Admitting: Family Medicine

## 2018-10-10 ENCOUNTER — Ambulatory Visit: Payer: 59

## 2018-10-18 ENCOUNTER — Ambulatory Visit: Payer: 59

## 2018-10-22 ENCOUNTER — Ambulatory Visit (INDEPENDENT_AMBULATORY_CARE_PROVIDER_SITE_OTHER): Payer: 59

## 2018-10-22 DIAGNOSIS — Z23 Encounter for immunization: Secondary | ICD-10-CM

## 2018-10-22 NOTE — Progress Notes (Signed)
Patient in for Influenza Vaccine and Shingrix #2 vaccine.  Patient tolerated injections well.   Roderic Ovens, RN

## 2018-10-30 ENCOUNTER — Telehealth: Payer: Self-pay | Admitting: Family Medicine

## 2018-10-30 ENCOUNTER — Inpatient Hospital Stay (HOSPITAL_COMMUNITY)
Admission: EM | Admit: 2018-10-30 | Discharge: 2018-11-02 | DRG: 682 | Disposition: A | Discharge status: Rehabilitation Facility | Payer: 59 | Attending: Internal Medicine | Admitting: Internal Medicine

## 2018-10-30 DIAGNOSIS — G9340 Encephalopathy, unspecified: Secondary | ICD-10-CM | POA: Diagnosis not present

## 2018-10-30 DIAGNOSIS — F329 Major depressive disorder, single episode, unspecified: Secondary | ICD-10-CM | POA: Diagnosis present

## 2018-10-30 DIAGNOSIS — F988 Other specified behavioral and emotional disorders with onset usually occurring in childhood and adolescence: Secondary | ICD-10-CM | POA: Diagnosis not present

## 2018-10-30 DIAGNOSIS — Z811 Family history of alcohol abuse and dependence: Secondary | ICD-10-CM

## 2018-10-30 DIAGNOSIS — R000 Tachycardia, unspecified: Secondary | ICD-10-CM | POA: Diagnosis present

## 2018-10-30 DIAGNOSIS — S199XXA Unspecified injury of neck, initial encounter: Secondary | ICD-10-CM | POA: Diagnosis not present

## 2018-10-30 DIAGNOSIS — F32A Depression, unspecified: Secondary | ICD-10-CM | POA: Diagnosis present

## 2018-10-30 DIAGNOSIS — D72819 Decreased white blood cell count, unspecified: Secondary | ICD-10-CM | POA: Diagnosis not present

## 2018-10-30 DIAGNOSIS — R918 Other nonspecific abnormal finding of lung field: Secondary | ICD-10-CM | POA: Diagnosis not present

## 2018-10-30 DIAGNOSIS — R5381 Other malaise: Secondary | ICD-10-CM | POA: Diagnosis not present

## 2018-10-30 DIAGNOSIS — Z8261 Family history of arthritis: Secondary | ICD-10-CM

## 2018-10-30 DIAGNOSIS — Z79899 Other long term (current) drug therapy: Secondary | ICD-10-CM

## 2018-10-30 DIAGNOSIS — E890 Postprocedural hypothyroidism: Secondary | ICD-10-CM | POA: Diagnosis not present

## 2018-10-30 DIAGNOSIS — G4700 Insomnia, unspecified: Secondary | ICD-10-CM | POA: Diagnosis present

## 2018-10-30 DIAGNOSIS — J9610 Chronic respiratory failure, unspecified whether with hypoxia or hypercapnia: Secondary | ICD-10-CM | POA: Diagnosis not present

## 2018-10-30 DIAGNOSIS — G4733 Obstructive sleep apnea (adult) (pediatric): Secondary | ICD-10-CM | POA: Diagnosis present

## 2018-10-30 DIAGNOSIS — Z8042 Family history of malignant neoplasm of prostate: Secondary | ICD-10-CM

## 2018-10-30 DIAGNOSIS — R9389 Abnormal findings on diagnostic imaging of other specified body structures: Secondary | ICD-10-CM | POA: Diagnosis not present

## 2018-10-30 DIAGNOSIS — S0990XA Unspecified injury of head, initial encounter: Secondary | ICD-10-CM | POA: Diagnosis not present

## 2018-10-30 DIAGNOSIS — E860 Dehydration: Secondary | ICD-10-CM | POA: Diagnosis not present

## 2018-10-30 DIAGNOSIS — R911 Solitary pulmonary nodule: Secondary | ICD-10-CM | POA: Diagnosis present

## 2018-10-30 DIAGNOSIS — Z79890 Hormone replacement therapy (postmenopausal): Secondary | ICD-10-CM

## 2018-10-30 DIAGNOSIS — N3001 Acute cystitis with hematuria: Secondary | ICD-10-CM | POA: Diagnosis present

## 2018-10-30 DIAGNOSIS — G9341 Metabolic encephalopathy: Secondary | ICD-10-CM | POA: Diagnosis not present

## 2018-10-30 DIAGNOSIS — E039 Hypothyroidism, unspecified: Secondary | ICD-10-CM | POA: Diagnosis not present

## 2018-10-30 DIAGNOSIS — S299XXA Unspecified injury of thorax, initial encounter: Secondary | ICD-10-CM | POA: Diagnosis not present

## 2018-10-30 DIAGNOSIS — R531 Weakness: Secondary | ICD-10-CM | POA: Diagnosis not present

## 2018-10-30 DIAGNOSIS — K219 Gastro-esophageal reflux disease without esophagitis: Secondary | ICD-10-CM | POA: Diagnosis not present

## 2018-10-30 DIAGNOSIS — G7111 Myotonic muscular dystrophy: Secondary | ICD-10-CM | POA: Diagnosis present

## 2018-10-30 DIAGNOSIS — R0902 Hypoxemia: Secondary | ICD-10-CM | POA: Diagnosis not present

## 2018-10-30 DIAGNOSIS — W19XXXD Unspecified fall, subsequent encounter: Secondary | ICD-10-CM | POA: Diagnosis not present

## 2018-10-30 DIAGNOSIS — N179 Acute kidney failure, unspecified: Secondary | ICD-10-CM | POA: Diagnosis not present

## 2018-10-30 DIAGNOSIS — F909 Attention-deficit hyperactivity disorder, unspecified type: Secondary | ICD-10-CM | POA: Diagnosis present

## 2018-10-30 DIAGNOSIS — E669 Obesity, unspecified: Secondary | ICD-10-CM | POA: Diagnosis present

## 2018-10-30 DIAGNOSIS — W19XXXA Unspecified fall, initial encounter: Secondary | ICD-10-CM

## 2018-10-30 DIAGNOSIS — Z800 Family history of malignant neoplasm of digestive organs: Secondary | ICD-10-CM

## 2018-10-30 DIAGNOSIS — D696 Thrombocytopenia, unspecified: Secondary | ICD-10-CM | POA: Diagnosis present

## 2018-10-30 DIAGNOSIS — Z8249 Family history of ischemic heart disease and other diseases of the circulatory system: Secondary | ICD-10-CM

## 2018-10-30 DIAGNOSIS — R1310 Dysphagia, unspecified: Secondary | ICD-10-CM | POA: Diagnosis present

## 2018-10-30 DIAGNOSIS — R296 Repeated falls: Secondary | ICD-10-CM | POA: Diagnosis present

## 2018-10-30 DIAGNOSIS — K760 Fatty (change of) liver, not elsewhere classified: Secondary | ICD-10-CM | POA: Diagnosis present

## 2018-10-30 DIAGNOSIS — Z6831 Body mass index (BMI) 31.0-31.9, adult: Secondary | ICD-10-CM

## 2018-10-30 DIAGNOSIS — Z87442 Personal history of urinary calculi: Secondary | ICD-10-CM

## 2018-10-30 DIAGNOSIS — Z85850 Personal history of malignant neoplasm of thyroid: Secondary | ICD-10-CM

## 2018-10-30 DIAGNOSIS — E785 Hyperlipidemia, unspecified: Secondary | ICD-10-CM | POA: Diagnosis present

## 2018-10-30 DIAGNOSIS — R1314 Dysphagia, pharyngoesophageal phase: Secondary | ICD-10-CM | POA: Diagnosis not present

## 2018-10-30 DIAGNOSIS — K5909 Other constipation: Secondary | ICD-10-CM | POA: Diagnosis present

## 2018-10-30 NOTE — ED Triage Notes (Signed)
Per [facility], pt from home w/ a c/o generalized weakness. The pt is reported to be at his baseline, CAOx4, but slower to respond. Hx of muscular dystrophy and thyroid CA. Pt has had 4 falls today. Denies head, neck, and back pain. No injuries sustained. No LOC. No N/V. Reports of RLQ and back pain one week ago but no current complaints. LSN 12 hours ago. BP 113/70, HR 88, RR 16, CBG 94, SpO2 100%.

## 2018-10-30 NOTE — Telephone Encounter (Signed)
Copied from [place] [id number]. Topic: General - Other >> Oct 30, 2018 12:48 PM Lennox Solders wrote: Reason for JOI:NOMVEHM [place] out pharm is calling the pt needs PA generic adderall 20 mg .please call [phone]. Pt is taking more than 2 pills a day

## 2018-10-31 ENCOUNTER — Encounter (HOSPITAL_COMMUNITY): Payer: Self-pay

## 2018-10-31 ENCOUNTER — Other Ambulatory Visit: Payer: Self-pay

## 2018-10-31 ENCOUNTER — Emergency Department (HOSPITAL_COMMUNITY): Payer: 59

## 2018-10-31 DIAGNOSIS — E039 Hypothyroidism, unspecified: Secondary | ICD-10-CM

## 2018-10-31 DIAGNOSIS — R911 Solitary pulmonary nodule: Secondary | ICD-10-CM | POA: Diagnosis present

## 2018-10-31 DIAGNOSIS — F329 Major depressive disorder, single episode, unspecified: Secondary | ICD-10-CM

## 2018-10-31 DIAGNOSIS — R531 Weakness: Secondary | ICD-10-CM | POA: Insufficient documentation

## 2018-10-31 DIAGNOSIS — N179 Acute kidney failure, unspecified: Secondary | ICD-10-CM

## 2018-10-31 DIAGNOSIS — W19XXXA Unspecified fall, initial encounter: Secondary | ICD-10-CM | POA: Diagnosis present

## 2018-10-31 DIAGNOSIS — K219 Gastro-esophageal reflux disease without esophagitis: Secondary | ICD-10-CM

## 2018-10-31 DIAGNOSIS — G9341 Metabolic encephalopathy: Secondary | ICD-10-CM | POA: Diagnosis present

## 2018-10-31 HISTORY — DX: Acute kidney failure, unspecified: N17.9

## 2018-10-31 LAB — COMPREHENSIVE METABOLIC PANEL
ALK PHOS: 99 U/L (ref 38–126)
ALT: 17 U/L (ref 0–44)
AST: 26 U/L (ref 15–41)
Albumin: 3.3 g/dL — ABNORMAL LOW (ref 3.5–5.0)
Anion gap: 8 (ref 5–15)
BUN: 8 mg/dL (ref 6–20)
CHLORIDE: 98 mmol/L (ref 98–111)
CO2: 26 mmol/L (ref 22–32)
CREATININE: 1.54 mg/dL — AB (ref 0.61–1.24)
Calcium: 9.5 mg/dL (ref 8.9–10.3)
GFR calc non Af Amer: 48 mL/min — ABNORMAL LOW (ref >60)
GFR, EST AFRICAN AMERICAN: 56 mL/min — AB (ref >60)
GLUCOSE: 96 mg/dL (ref 70–99)
Potassium: 3.7 mmol/L (ref 3.5–5.1)
SODIUM: 132 mmol/L — AB (ref 135–145)
Total Bilirubin: 0.9 mg/dL (ref 0.3–1.2)
Total Protein: 6.8 g/dL (ref 6.5–8.1)

## 2018-10-31 LAB — CBC
HCT: 43.9 % (ref 39.0–52.0)
HEMATOCRIT: 44.7 % (ref 39.0–52.0)
HEMOGLOBIN: 13.9 g/dL (ref 13.0–17.0)
HEMOGLOBIN: 13.4 g/dL (ref 13.0–17.0)
MCH: 29.9 pg (ref 26.0–34.0)
MCH: 28.6 pg (ref 26.0–34.0)
MCHC: 31.1 g/dL (ref 30.0–36.0)
MCHC: 30.5 g/dL (ref 30.0–36.0)
MCV: 96.1 fL (ref 80.0–100.0)
MCV: 93.8 fL (ref 80.0–100.0)
Platelets: 221 10*3/uL (ref 150–400)
Platelets: 140 10*3/uL — ABNORMAL LOW (ref 150–400)
RBC: 4.65 MIL/uL (ref 4.22–5.81)
RBC: 4.68 MIL/uL (ref 4.22–5.81)
RDW: 14.6 % (ref 11.5–15.5)
RDW: 14.4 % (ref 11.5–15.5)
WBC: 7.9 10*3/uL (ref 4.0–10.5)
WBC: 5.4 10*3/uL (ref 4.0–10.5)
nRBC: 0 % (ref 0.0–0.2)
nRBC: 0 % (ref 0.0–0.2)

## 2018-10-31 LAB — DIFFERENTIAL
ABS IMMATURE GRANULOCYTES: 0.03 10*3/uL (ref 0.00–0.07)
BASOS ABS: 0 10*3/uL (ref 0.0–0.1)
BASOS PCT: 0 %
Eosinophils Absolute: 0 10*3/uL (ref 0.0–0.5)
Eosinophils Relative: 1 %
IMMATURE GRANULOCYTES: 0 %
LYMPHS PCT: 14 %
Lymphs Abs: 1.1 10*3/uL (ref 0.7–4.0)
Monocytes Absolute: 0.7 10*3/uL (ref 0.1–1.0)
Monocytes Relative: 8 %
NEUTROS ABS: 6.1 10*3/uL (ref 1.7–7.7)
NEUTROS PCT: 77 %

## 2018-10-31 LAB — BASIC METABOLIC PANEL
ANION GAP: 9 (ref 5–15)
BUN: 8 mg/dL (ref 6–20)
CO2: 26 mmol/L (ref 22–32)
Calcium: 9.2 mg/dL (ref 8.9–10.3)
Chloride: 102 mmol/L (ref 98–111)
Creatinine, Ser: 1.18 mg/dL (ref 0.61–1.24)
GFR calc Af Amer: >60 mL/min (ref >60)
GLUCOSE: 79 mg/dL (ref 70–99)
Potassium: 3.5 mmol/L (ref 3.5–5.1)
Sodium: 137 mmol/L (ref 135–145)

## 2018-10-31 LAB — URINALYSIS, ROUTINE W REFLEX MICROSCOPIC
BILIRUBIN URINE: NEGATIVE
Bacteria, UA: NONE SEEN
GLUCOSE, UA: NEGATIVE mg/dL
Ketones, ur: NEGATIVE mg/dL
Nitrite: NEGATIVE
Protein, ur: NEGATIVE mg/dL
Specific Gravity, Urine: 1.018 (ref 1.005–1.030)
pH: 5 (ref 5.0–8.0)

## 2018-10-31 LAB — TSH: TSH: 2.335 u[IU]/mL (ref 0.350–4.500)

## 2018-10-31 LAB — PROTIME-INR
INR: 1.25
Prothrombin Time: 15.6 seconds — ABNORMAL HIGH (ref 11.4–15.2)

## 2018-10-31 LAB — RAPID URINE DRUG SCREEN, HOSP PERFORMED
Amphetamines: NOT DETECTED
Barbiturates: NOT DETECTED
Benzodiazepines: NOT DETECTED
COCAINE: NOT DETECTED
Opiates: NOT DETECTED
TETRAHYDROCANNABINOL: NOT DETECTED

## 2018-10-31 LAB — ETHANOL

## 2018-10-31 LAB — APTT: APTT: 34 s (ref 24–36)

## 2018-10-31 LAB — AMMONIA: Ammonia: 35 umol/L (ref 9–35)

## 2018-10-31 MED ORDER — ONDANSETRON HCL 4 MG/2ML IJ SOLN: 4.0000 mg | Freq: Three times a day (TID) | INTRAMUSCULAR | Status: DC | PRN

## 2018-10-31 MED ORDER — SODIUM CHLORIDE 0.9 % IV SOLN
INTRAVENOUS | Status: DC
  Administered 2018-10-31 – 2018-11-01 (×2): via INTRAVENOUS

## 2018-10-31 MED ORDER — SENNOSIDES-DOCUSATE SODIUM 8.6-50 MG PO TABS: 1.0000 | ORAL_TABLET | Freq: Every evening | ORAL | Status: DC | PRN

## 2018-10-31 MED ORDER — AMPHETAMINE-DEXTROAMPHETAMINE 10 MG PO TABS
20.0000 mg | ORAL_TABLET | Freq: Two times a day (BID) | ORAL | Status: DC
  Administered 2018-10-31 – 2018-11-02 (×5): 20 mg via ORAL
  Filled 2018-10-31 (×5): qty 2

## 2018-10-31 MED ORDER — SODIUM CHLORIDE 0.9 % IV SOLN
1.0000 g | Freq: Once | INTRAVENOUS | Status: AC
  Administered 2018-10-31: 1 g via INTRAVENOUS
  Filled 2018-10-31: qty 10

## 2018-10-31 MED ORDER — ENOXAPARIN SODIUM 40 MG/0.4ML ~~LOC~~ SOLN
40.0000 mg | SUBCUTANEOUS | Status: DC
  Administered 2018-10-31 – 2018-11-02 (×3): 40 mg via SUBCUTANEOUS
  Filled 2018-10-31 (×3): qty 0.4

## 2018-10-31 MED ORDER — TRAZODONE HCL 100 MG PO TABS
100.0000 mg | ORAL_TABLET | Freq: Every evening | ORAL | Status: DC | PRN
  Administered 2018-11-01: 100 mg via ORAL
  Filled 2018-10-31: qty 1

## 2018-10-31 MED ORDER — ACETAMINOPHEN 650 MG RE SUPP: 650.0000 mg | Freq: Four times a day (QID) | RECTAL | Status: DC | PRN

## 2018-10-31 MED ORDER — LEVOTHYROXINE SODIUM 150 MCG PO TABS
150.0000 ug | ORAL_TABLET | ORAL | Status: DC
  Administered 2018-11-01: 150 ug via ORAL
  Filled 2018-10-31: qty 1

## 2018-10-31 MED ORDER — SODIUM CHLORIDE 0.9 % IV SOLN
1.0000 g | INTRAVENOUS | Status: AC
  Administered 2018-11-01 – 2018-11-02 (×2): 1 g via INTRAVENOUS
  Filled 2018-10-31: qty 10
  Filled 2018-10-31: qty 1

## 2018-10-31 MED ORDER — FLUOXETINE HCL 20 MG PO CAPS
20.0000 mg | ORAL_CAPSULE | Freq: Every day | ORAL | Status: DC
  Administered 2018-10-31 – 2018-11-02 (×3): 20 mg via ORAL
  Filled 2018-10-31 (×3): qty 1

## 2018-10-31 MED ORDER — SODIUM CHLORIDE 0.9 % IV BOLUS (SEPSIS)
1000.0000 mL | Freq: Once | INTRAVENOUS | Status: AC
  Administered 2018-10-31: 1000 mL via INTRAVENOUS

## 2018-10-31 MED ORDER — ZOLPIDEM TARTRATE 5 MG PO TABS: 5.0000 mg | ORAL_TABLET | Freq: Every evening | ORAL | Status: DC | PRN

## 2018-10-31 MED ORDER — ACETAMINOPHEN 325 MG PO TABS
650.0000 mg | ORAL_TABLET | Freq: Four times a day (QID) | ORAL | Status: DC | PRN
  Administered 2018-11-01: 650 mg via ORAL
  Filled 2018-10-31: qty 2

## 2018-10-31 NOTE — Evaluation (Signed)
Occupational Therapy Evaluation Patient Details Name: [name] MRN: [id number] DOB: 10-Oct-1961 Today's Date: 10/31/2018    History of Present Illness Pt is a 57 y.o. male with medical history significant of myotonic dystrophy, GERD, secondary hypothyroidism after thyroidectomy because of thyroid cancer, depression, ADHD, OSA not on CPAP, fatty liver, hyperlipidemia, pulmonary nodules, who presents with generalized weakness, falls and confusion. CT of head and C-spine negative for acute issues, but showed several pulmonary nodules in the right apex area.   Clinical Impression   This 57 y/o male presents with the above. At baseline pt reports he was independent with ADLs and functional mobility, lives with spouse. Pt presenting with cognitive impairments, generalized weakness, decreased dynamic balance. Pt requiring two person assist for functional mobility this session using RW; currently requires minA for UB ADL, mod-maxA for LB and toileting ADLs. Pt impulsive during session and requiring multimodal cues for safety and to follow simple commands throughout mobility and functional task completion. Pt will benefit from continued OT services, feel he is appropriate for CIR level therapy services at time of discharge to maximize his overall safety and independence with ADLs and mobility prior to return home. Will continue to follow acutely.     Follow Up Recommendations  CIR;Supervision/Assistance - 24 hour    Equipment Recommendations  Other (comment)(TBD in next venue)           Precautions / Restrictions Precautions Precautions: Fall Restrictions Weight Bearing Restrictions: No      Mobility Bed Mobility Overal bed mobility: Needs Assistance Bed Mobility: Supine to Sit     Supine to sit: Min assist     General bed mobility comments: increased time and effort, assist for trunk elevation  Transfers Overall transfer level: Needs assistance Equipment used: Rolling walker  (2 wheeled);None Transfers: Sit to/from Stand Sit to Stand: Mod assist;+2 physical assistance         General transfer comment: increased time and effort, multimodal cueing for safety and technique, assist to power into standing from EOB x1 and from toilet x1    Balance Overall balance assessment: Needs assistance Sitting-balance support: Feet supported Sitting balance-Leahy Scale: Good     Standing balance support: During functional activity;Bilateral upper extremity supported Standing balance-Leahy Scale: Poor                             ADL either performed or assessed with clinical judgement   ADL Overall ADL's : Needs assistance/impaired Eating/Feeding: Set up;Sitting   Grooming: Set up;Min guard;Sitting   Upper Body Bathing: Minimal assistance;Sitting   Lower Body Bathing: Moderate assistance;+2 for physical assistance;+2 for safety/equipment;Sit to/from stand   Upper Body Dressing : Minimal assistance;Sitting   Lower Body Dressing: Moderate assistance;+2 for physical assistance;+2 for safety/equipment;Sit to/from stand Lower Body Dressing Details (indicate cue type and reason): pt able to don L sock, able to perform figure 4 technique with RLE though unable to maintain while donning sock, requires assist to complete Toilet Transfer: Minimal assistance;+2 for physical assistance;+2 for safety/equipment;RW;BSC Toilet Transfer Details (indicate cue type and reason): BSC over toilet; due to limited space in bathroom had to transition from RW with +2 assist to HHA+2 Toileting- Clothing Manipulation and Hygiene: Minimal assistance;Sit to/from stand;+2 for physical assistance;+2 for safety/equipment Toileting - Clothing Manipulation Details (indicate cue type and reason): assist for gown/brief management     Functional mobility during ADLs: Minimal assistance;+2 for physical assistance;+2 for safety/equipment;Rolling walker  Pertinent Vitals/Pain Pain Assessment: No/denies pain     Hand Dominance     Extremity/Trunk Assessment Upper Extremity Assessment Upper Extremity Assessment: Generalized weakness   Lower Extremity Assessment Lower Extremity Assessment: Generalized weakness       Communication Communication Communication: Other (comment)(difficult to understand at times, pt mumbling)   Cognition Arousal/Alertness: Awake/alert Behavior During Therapy: Impulsive Overall Cognitive Status: No family/caregiver present to determine baseline cognitive functioning Area of Impairment: Attention;Following commands;Safety/judgement;Awareness                   Current Attention Level: Selective   Following Commands: Follows one step commands with increased time;Follows one step commands inconsistently Safety/Judgement: Decreased awareness of safety Awareness: Emergent   General Comments: pt impulsive during mobility and completion of functional tasks requiring increased cues for safety; requires increased time and multimodal cues to sequence/follow one step commands. Pt with difficulty completing dual task, when pt responding to questions while walking noted pt often having to stop and stand still prior to responding   General Comments  HR up to 138 with activity    Exercises     Shoulder Instructions      Home Living Family/patient expects to be discharged to:: Private residence Living Arrangements: Spouse/significant other;Children Available Help at Discharge: Family;Available 24 hours/day Type of Home: House Home Access: Stairs to enter [employer] of Steps: 5 Entrance Stairs-Rails: Right;Left Home Layout: Two level;Bed/bath upstairs Alternate Level Stairs-Number of Steps: flight Alternate Level Stairs-Rails: Right;Left Bathroom Shower/Tub: Tub/shower unit         Home Equipment: Other (comment);Shower seat(has access to a walker but unsure if it is appropriate)           Prior Functioning/Environment Level of Independence: Independent        Comments: pt reports was ambulating without AD and completing ADLs without assist        OT Problem List: Decreased strength;Decreased range of motion;Decreased activity tolerance;Impaired balance (sitting and/or standing);Decreased cognition;Decreased safety awareness;Decreased knowledge of use of DME or AE      OT Treatment/Interventions: Self-care/ADL training;Therapeutic exercise;DME and/or AE instruction;Therapeutic activities;Patient/family education;Balance training    OT Goals(Current goals can be found in the care plan section) Acute Rehab OT Goals Patient Stated Goal: return home OT Goal Formulation: With patient Time For Goal Achievement: 11/14/18 Potential to Achieve Goals: Good  OT Frequency: Min 2X/week   Barriers to D/C:            Co-evaluation PT/OT/SLP Co-Evaluation/Treatment: Yes Reason for Co-Treatment: For patient/therapist safety;To address functional/ADL transfers PT goals addressed during session: Mobility/safety with mobility;Balance;Proper use of DME;Strengthening/ROM OT goals addressed during session: ADL's and self-care;Proper use of Adaptive equipment and DME      AM-PAC PT "6 Clicks" Daily Activity     Outcome Measure Help from another person eating meals?: A Little Help from another person taking care of personal grooming?: A Little Help from another person toileting, which includes using toliet, bedpan, or urinal?: A Lot Help from another person bathing (including washing, rinsing, drying)?: A Lot Help from another person to put on and taking off regular upper body clothing?: A Little Help from another person to put on and taking off regular lower body clothing?: A Lot 6 Click Score: 15   End of Session Equipment Utilized During Treatment: Gait belt;Rolling walker Nurse Communication: Mobility status  Activity Tolerance: Patient tolerated treatment  well Patient left: in chair;with call bell/phone within reach;with chair alarm set  OT Visit Diagnosis: Unsteadiness on feet (  R26.81);Muscle weakness (generalized) (M62.81)                Time: 0312-8118 OT Time Calculation (min): 34 min Charges:  OT General Charges $OT Visit: 1 Visit OT Evaluation $OT Eval Moderate Complexity: [place], OT [employer] Pager [phone] Office [phone]   Raymondo Band 10/31/2018, 3:19 PM

## 2018-10-31 NOTE — Evaluation (Signed)
Physical Therapy Evaluation Patient Details Name: [name] MRN: [id number] DOB: 12-05-1961 Today's Date: 10/31/2018   History of Present Illness  Pt is a 57 y.o. male with medical history significant of myotonic dystrophy, GERD, secondary hypothyroidism after thyroidectomy because of thyroid cancer, depression, ADHD, OSA not on CPAP, fatty liver, hyperlipidemia, pulmonary nodules, who presents with generalized weakness, falls and confusion. CT of head and C-spine negative for acute issues, but showed several pulmonary nodules in the right apex area.    Clinical Impression  Pt presented supine in bed with HOB flat, awake and willing to participate in therapy session. Prior to admission, pt reported that he was independent with ambulation as well as ADLs. Pt lives with his spouse and son in a two level home with several steps to enter. Pt's wife works at [employer] in Risk Management. When his wife is at work, his son is available to assist/supervise him. Pt currently requires min A for bed mobility, mod A x2 for transfers and min A x2 for ambulation with RW. Pt with poor safety awareness and very impulsive with all mobility. Pt would greatly benefit from further intensive therapy services at CIR prior to returning home with family support. PT will continue to follow acutely to progress mobility as tolerated.     Follow Up Recommendations CIR    Equipment Recommendations  Rolling walker with 5" wheels    Recommendations for Other Services Rehab consult     Precautions / Restrictions Precautions Precautions: Fall Restrictions Weight Bearing Restrictions: No      Mobility  Bed Mobility Overal bed mobility: Needs Assistance Bed Mobility: Supine to Sit     Supine to sit: Min assist     General bed mobility comments: increased time and effort, assist for trunk elevation  Transfers Overall transfer level: Needs assistance Equipment used: Rolling walker (2  wheeled);None Transfers: Sit to/from Stand Sit to Stand: Mod assist;+2 physical assistance         General transfer comment: increased time and effort, multimodal cueing for safety and technique, assist to power into standing from EOB x1 and from toilet x1  Ambulation/Gait Ambulation/Gait assistance: Min assist;+2 safety/equipment;+2 physical assistance Gait Distance (Feet): 75 Feet Assistive device: Rolling walker (2 wheeled) Gait Pattern/deviations: Step-through pattern;Decreased step length - right;Decreased step length - left;Decreased stride length;Shuffle;Drifts right/left Gait velocity: decreased   General Gait Details: pt with modest instability requiring constant min A x2 for safety; pt impulsive and requiring frequent cueing for safety and to maintain a safe distance from RW. pt with HR increasing to as high as 131 bpm, pt asymptomatic throughout  Stairs            Wheelchair Mobility    Modified Rankin (Stroke Patients Only) Modified Rankin (Stroke Patients Only) Pre-Morbid Rankin Score: No symptoms Modified Rankin: Moderately severe disability     Balance Overall balance assessment: Needs assistance Sitting-balance support: Feet supported Sitting balance-Leahy Scale: Good     Standing balance support: During functional activity;Bilateral upper extremity supported Standing balance-Leahy Scale: Poor                               Pertinent Vitals/Pain Pain Assessment: No/denies pain    Home Living Family/patient expects to be discharged to:: Private residence Living Arrangements: Spouse/significant other;Children Available Help at Discharge: Family;Available 24 hours/day Type of Home: House Home Access: Stairs to enter Entrance Stairs-Rails: Right;Left Entrance Stairs-Number of Steps: 5 Home Layout:  Two level;Bed/bath upstairs Home Equipment: Other (comment);Shower seat(has access to a walker but unsure if it is appropriate)      Prior  Function Level of Independence: Independent         Comments: pt reports was ambulating without AD and completing ADLs without assist     Hand Dominance        Extremity/Trunk Assessment   Upper Extremity Assessment Upper Extremity Assessment: Generalized weakness    Lower Extremity Assessment Lower Extremity Assessment: Generalized weakness       Communication   Communication: Other (comment)(difficult to understand at times, pt mumbling)  Cognition Arousal/Alertness: Awake/alert Behavior During Therapy: Impulsive Overall Cognitive Status: No family/caregiver present to determine baseline cognitive functioning Area of Impairment: Attention;Following commands;Safety/judgement;Awareness                   Current Attention Level: Selective   Following Commands: Follows one step commands with increased time;Follows one step commands inconsistently Safety/Judgement: Decreased awareness of safety Awareness: Emergent   General Comments: pt impulsive during mobility and completion of functional tasks requiring increased cues for safety; requires increased time and multimodal cues to sequence/follow one step commands. Pt with difficulty completing dual task, when pt responding to questions while walking noted pt often having to stop and stand still prior to responding      General Comments      Exercises     Assessment/Plan    PT Assessment Patient needs continued PT services  PT Problem List Decreased strength;Decreased balance;Decreased mobility;Decreased coordination;Decreased knowledge of use of DME;Decreased safety awareness;Decreased knowledge of precautions       PT Treatment Interventions DME instruction;Gait training;Stair training;Functional mobility training;Therapeutic exercise;Therapeutic activities;Balance training;Neuromuscular re-education;Patient/family education;Cognitive remediation    PT Goals (Current goals can be found in the Care Plan  section)  Acute Rehab PT Goals Patient Stated Goal: return home PT Goal Formulation: With patient Time For Goal Achievement: 11/14/18 Potential to Achieve Goals: Fair    Frequency Min 3X/week   Barriers to discharge        Co-evaluation PT/OT/SLP Co-Evaluation/Treatment: Yes Reason for Co-Treatment: For patient/therapist safety;To address functional/ADL transfers PT goals addressed during session: Mobility/safety with mobility;Balance;Proper use of DME;Strengthening/ROM OT goals addressed during session: ADL's and self-care;Proper use of Adaptive equipment and DME       AM-PAC PT "6 Clicks" Daily Activity  Outcome Measure Difficulty turning over in bed (including adjusting bedclothes, sheets and blankets)?: Unable Difficulty moving from lying on back to sitting on the side of the bed? : Unable Difficulty sitting down on and standing up from a chair with arms (e.g., wheelchair, bedside commode, etc,.)?: Unable Help needed moving to and from a bed to chair (including a wheelchair)?: A Little Help needed walking in hospital room?: A Little Help needed climbing 3-5 steps with a railing? : A Lot 6 Click Score: 11    End of Session Equipment Utilized During Treatment: Gait belt Activity Tolerance: Patient tolerated treatment well Patient left: in chair;with call bell/phone within reach;with chair alarm set Nurse Communication: Mobility status PT Visit Diagnosis: Other abnormalities of gait and mobility (R26.89);Muscle weakness (generalized) (M62.81)    Time: 5701-7793 PT Time Calculation (min) (ACUTE ONLY): 34 min   Charges:   PT Evaluation $PT Eval Moderate Complexity: 1 Mod          Sherie Don, PT, DPT  Acute Rehabilitation Services Pager [phone] Office [place] 10/31/2018, 2:35 PM

## 2018-10-31 NOTE — H&P (Signed)
History and Physical    EDWORD CU TIR:[id number] DOB: December 26, 1961 DOA: 10/30/2018  Referring MD/NP/PA:   PCP: Tammi Sou, MD   Patient coming from:  The patient is coming from home.  At baseline, pt is partially dependent for most of ADL.        Chief Complaint: Generalized weakness, falls, confusion  HPI: [name] is a 57 y.o. male with medical history significant of myotonic dystrophy, GERD, secondary hypothyroidism after thyroidectomy because of thyroid cancer, depression, ADHD, OSA not on CPAP, fatty liver, hyperlipidemia, pulmonary nodules, who presents with generalized weakness, falls and confusion.  Per patient's wife, patient myotonic dystrophy has been gradually declining recently, but still able to do most of the ADL and walking at baseline.  In the past several days, patient has increased generalized weakness, particularly in both legs, but no unilateral tingling in extremities, no facial droop, slurred speech.  Patient has had multiple falls, without significant injury, particularly no head or neck injury.  Since yesterday, patient was noted to be mildly confused and has slower response. Patient does not seem to have chest pain, shortness of breath.  He has mild dry cough which is chronic issue, no change recently.  Patient does not have nausea, vomiting, diarrhea or abdominal pain.  Wife reports that the patient had left lower quadrant abdominal pain and left flank pain yesterday, which seems to have resolved.  Patient does not have left lower quadrant pain or left flank pain today.  No CVA tenderness on examination.  Patient does not have symptoms of UTI.  No fever or chills. Patient was given 1 L normal saline bolus in the ED.  His mental status has improved.  Currently patient is oriented x3 when I saw pt in ED  ED Course: pt was found to have urinalysis with mild pyuria (clear appearance, trace amount of leukocyte, no bacteria, WBC 11-20), WBC 7.9, INR 1.25, PTT  34, negative UDS, AKI with creatinine 1.54, BUN 8, alcohol level less than 10, temperature 99.5, initially tachycardia, currently heart rate 80s, oxygen satting 98-100% on room air.  CT of head and C-spine negative for acute issues, but showed several pulmonary nodules in the right apex area.  X-ray showed low inspiration, without infiltration.  Patient is placed on telemetry bed of observation.  Review of Systems:   General: no fevers, chills, no body weight gain, has fatigue HEENT: no blurry vision, hearing changes or sore throat Respiratory: no dyspnea, has coughing, no wheezing CV: no chest pain, no palpitations GI: no nausea, vomiting, abdominal pain, diarrhea, constipation GU: no dysuria, burning on urination, increased urinary frequency, hematuria  Ext: no leg edema Neuro: no unilateral weakness, numbness, or tingling, no vision change or hearing loss. Skin: no rash, no skin tear. MSK: No muscle spasm, no deformity, no limitation of range of movement in spin Heme: No easy bruising.  Travel history: No recent long distant travel.  Allergy: No Known Allergies  Past Medical History:  Diagnosis Date  . Adult ADHD   . Aspiration pneumonitis ([place]) 04/2017, 08/2017   Recurrent  . Chronic respiratory failure ([place])    As of 04/2018, Dr. Chase Caller suspects hypercapnic RF--but [name] normal.  May eventually need nocturnal noninvasive ventilation.  Ultimately, if progresses enough pt will need trach.  . Constipation, chronic    Has caused irritative urinary/bladder symptoms in the past.  . Depression   . Dizziness 06/2011+   Noncontrast CT and noncontrast MRI both negative 06/2011 at [facility] ED  visit.  . Elevated transaminase level    Liver biopsy revealed fatty liver  . Erectile dysfunction   . Facial fracture ([place]) 06/2011   Nondisplaced bilateral [place] I fracture: no surgery required. (ENT, Dr. Wilburn Cornelia)   . Fatty liver disease, nonalcoholic   . GERD (gastroesophageal reflux disease)   .  History of thyroid cancer 2010   Medullary carcinoma of thyroid. Thyroidectomy 08/2009 (Dr. Aida Puffer at [facility]).  Needs periodic serum calcitonin monitoring.  Marland Kitchen Hyperlipidemia   . Hypothyroidism   . LVH (left ventricular hypertrophy)    by EKG criteria 06/2011.  Hx of heart murmur--Followed by cardiology at [facility]: EF's have been "stable" per cardio office note 12/29/10.  His echo 12/2012 showed normal LV size and wall thickness but EF 45-50% and global LV hypokinesis was found.  . Myotonic dystrophy ([place]) Dx'd 1996   Progressive muscle weakness and swallowing dysfunction.  Initial dx by Dr. Jannifer Franklin, neurologist, but subsequent annual specialist f/u has been with Dr. Ladon Applebaum [facility].  Progressive debilitation--PT referral 04/2017, pt quit PT 05/2017.  . Nephrolithiasis   . Obesity, Class I, BMI 30-34.9   . OSA (obstructive sleep apnea)    does not wear CPAP  . Pulmonary nodule, right 05/2017   RUL, 7 mm sub solid nodule.  Resolution noted on f/u CT 08/2017.    Past Surgical History:  Procedure Laterality Date  . COLONOSCOPY  09/13/2007   normal (Dr. Deatra Ina).  . EXTRACORPOREAL SHOCK WAVE LITHOTRIPSY    . THYROIDECTOMY  08/2009   for medullary thyroid cancer (Dr. Conley Canal at [facility] follows him for this).  . TRANSTHORACIC ECHOCARDIOGRAM  12/2012; 09/2017   2014-Normal LV size and wall thickness, LV EF 45-50%, with global hypokinesis of LV.  2018- EF 50-55%, normal wall motion, grd I DD, valves normal.    Social History:  reports that he has never smoked. He has never used smokeless tobacco. He reports that he drinks alcohol. He reports that he does not use drugs.  Family History:  Family History  Problem Relation Age of Onset  . Arthritis Mother   . Hypertension Mother   . Alcohol abuse Father   . Arthritis Father   . Hypertension Father   . Colon cancer Father   . Prostate cancer Father      Prior to Admission medications   Medication Sig Start Date End Date Taking? Authorizing  Provider  acetaminophen (TYLENOL) 500 MG tablet Take 1,000 mg by mouth every 6 (six) hours as needed (pain).    Yes [provider]  amphetamine-dextroamphetamine (ADDERALL) 20 MG tablet 1 tab po tid Patient taking differently: Take 20 mg by mouth 2 (two) times daily.  07/17/18  Yes [name], [name], MD  FLUoxetine (PROZAC) 20 MG tablet TAKE 1 TABLET BY MOUTH ONCE DAILY Patient taking differently: Take 20 mg by mouth daily.  09/14/18  Yes [name], [name], MD  levothyroxine (SYNTHROID, LEVOTHROID) 150 MCG tablet TAKE 1 TABLET BY MOUTH ONCE DAILY EXCEPT FOR WEDNESDAY  SKIP DOSE Patient taking differently: Take 150 mcg by mouth See admin instructions. Take 1 tablet every day except on Sunday 09/24/18  Yes [name], [name], MD  omeprazole (PRILOSEC) 40 MG capsule Take 1 capsule (40 mg total) by mouth daily. 09/07/17  Yes [name], [name], MD  traZODone (DESYREL) 100 MG tablet TAKE 1 TABLET BY MOUTH AT BEDTIME AS NEEDED FOR SLEEP. Patient taking differently: Take 100 mg by mouth at bedtime as needed for sleep.  07/04/18  Yes  [name], [name], MD  levothyroxine (SYNTHROID, LEVOTHROID) 150 MCG tablet TAKE 1 TABLET BY MOUTH ONCE DAILY EXCEPT FOR [facility] AND [place] SKIP DOSE Patient not taking: Reported on 10/31/2018 07/12/18   Tammi Sou, MD    Physical Exam: Vitals:   10/31/18 0100 10/31/18 0200 10/31/18 0241 10/31/18 0300  BP: 113/80 128/85 124/84 126/84  Pulse: 75 76 79 81  Resp: 19 18 16 20   Temp:   99.5 F (37.5 C)   TempSrc:   Rectal   SpO2: 99% 99% 98% 98%  Weight:      Height:       General: Not in acute distress.  Dry mucous membrane. HEENT:       Eyes: PERRL, EOMI, no scleral icterus.       ENT: No discharge from the ears and nose, no pharynx injection, no tonsillar enlargement.        Neck: No JVD, no bruit, no mass felt. Heme: No neck lymph node enlargement. Cardiac: S1/S2, RRR, No murmurs, No gallops or rubs. Respiratory: No rales, wheezing, rhonchi or  rubs. GI: Soft, nondistended, nontender, no rebound pain, no organomegaly, BS present. GU: No hematuria Ext: No pitting leg edema bilaterally. 2+DP/PT pulse bilaterally. Musculoskeletal: No joint deformities, No joint redness or warmth, no limitation of ROM in spin. Skin: No rashes.  Neuro: Alert, mildly confused, slow response, but oriented X3, cranial nerves II-XII grossly intact, moves all extremities with weakness in all extremities. Psych: Patient is not psychotic, no suicidal or hemocidal ideation.  Labs on Admission: I have personally reviewed following labs and imaging studies  CBC: Recent Labs  Lab 10/31/18 0137  WBC 7.9  NEUTROABS 6.1  HGB 13.9  HCT 44.7  MCV 96.1  PLT 366   Basic Metabolic Panel: Recent Labs  Lab 10/31/18 0137  NA 132*  K 3.7  CL 98  CO2 26  GLUCOSE 96  BUN 8  CREATININE 1.54*  CALCIUM 9.5   GFR: Estimated Creatinine Clearance: 52.8 mL/min (A) (by C-G formula based on SCr of 1.54 mg/dL (H)). Liver Function Tests: Recent Labs  Lab 10/31/18 0137  AST 26  ALT 17  ALKPHOS 99  BILITOT 0.9  PROT 6.8  ALBUMIN 3.3*   No results for input(s): LIPASE, AMYLASE in the last 168 hours. No results for input(s): AMMONIA in the last 168 hours. Coagulation Profile: Recent Labs  Lab 10/31/18 0137  INR 1.25   Cardiac Enzymes: No results for input(s): CKTOTAL, CKMB, CKMBINDEX, TROPONINI in the last 168 hours. BNP (last 3 results) No results for input(s): PROBNP in the last 8760 hours. HbA1C: No results for input(s): HGBA1C in the last 72 hours. CBG: No results for input(s): GLUCAP in the last 168 hours. Lipid Profile: No results for input(s): CHOL, HDL, LDLCALC, TRIG, CHOLHDL, LDLDIRECT in the last 72 hours. Thyroid Function Tests: No results for input(s): TSH, T4TOTAL, FREET4, T3FREE, THYROIDAB in the last 72 hours. Anemia Panel: No results for input(s): VITAMINB12, FOLATE, FERRITIN, TIBC, IRON, RETICCTPCT in the last 72 hours. Urine  analysis:    Component Value Date/Time   COLORURINE YELLOW 10/31/2018 0224   APPEARANCEUR CLEAR 10/31/2018 0224   LABSPEC 1.018 10/31/2018 0224   PHURINE 5.0 10/31/2018 0224   GLUCOSEU NEGATIVE 10/31/2018 0224   HGBUR MODERATE (A) 10/31/2018 0224   BILIRUBINUR NEGATIVE 10/31/2018 0224   KETONESUR NEGATIVE 10/31/2018 0224   PROTEINUR NEGATIVE 10/31/2018 0224   UROBILINOGEN 0.2 11/22/2007 2045   NITRITE NEGATIVE 10/31/2018 0224   LEUKOCYTESUR TRACE (A)  10/31/2018 0224   Sepsis Labs: @LABRCNTIP (procalcitonin:4,lacticidven:4) )No results found for this or any previous visit (from the past 240 hour(s)).   Radiological Exams on Admission: Ct Head Wo Contrast  Result Date: 10/31/2018 CLINICAL DATA:  Head trauma. EXAM: CT HEAD WITHOUT CONTRAST CT CERVICAL SPINE WITHOUT CONTRAST TECHNIQUE: Multidetector CT imaging of the head and cervical spine was performed following the standard protocol without intravenous contrast. Multiplanar CT image reconstructions of the cervical spine were also generated. COMPARISON:  MRI brain 07/15/2011. CT head 07/15/2011. CT head and cervical spine 07/08/2011 FINDINGS: CT HEAD FINDINGS Brain: Diffuse cerebral atrophy. Ventricular dilatation consistent with central atrophy. Low-attenuation changes in the deep white matter consistent with small vessel ischemia. No mass-effect or midline shift. No abnormal extra-axial fluid collections. Gray-white matter junctions are distinct. Basal cisterns are not effaced. No acute intracranial hemorrhage. Vascular: No hyperdense vessel or unexpected calcification. Skull: Normal. Negative for fracture or focal lesion. Sinuses/Orbits: Mucosal thickening in the paranasal sinuses. No acute air-fluid levels. Mastoid air cells are clear. Other: None. CT CERVICAL SPINE FINDINGS Alignment: Normal alignment of the cervical vertebrae and facet joints. C1-2 articulation appears intact. Skull base and vertebrae: Skull base is intact. No vertebral  compression deformities. No focal bone lesion or bone destruction. Bone cortex appears intact. Soft tissues and spinal canal: No paraspinal soft tissue mass or infiltration. No prevertebral soft tissue swelling. Disc levels:  Intervertebral disc space heights are preserved. Upper chest: Several nodular opacities in the right lung apex are new since previous study and may represent nodular areas of infiltration. Largest measures about 6 x 10 mm. Recommend three-month follow-up to exclude pulmonary nodule. Surgical clips in the base of the neck. Other: None. IMPRESSION: 1. No acute intracranial abnormalities. Chronic atrophy and small vessel ischemic changes. 2. Normal alignment of the cervical spine. No acute displaced fractures identified. 3. Several nodular opacities in the right lung apex are new since previous study and may represent nodular areas of infiltration. Recommend three-month follow-up to exclude pulmonary nodule. Electronically Signed   By: Lucienne Capers M.D.   On: 10/31/2018 02:11   Ct Cervical Spine Wo Contrast  Result Date: 10/31/2018 CLINICAL DATA:  Head trauma. EXAM: CT HEAD WITHOUT CONTRAST CT CERVICAL SPINE WITHOUT CONTRAST TECHNIQUE: Multidetector CT imaging of the head and cervical spine was performed following the standard protocol without intravenous contrast. Multiplanar CT image reconstructions of the cervical spine were also generated. COMPARISON:  MRI brain 07/15/2011. CT head 07/15/2011. CT head and cervical spine 07/08/2011 FINDINGS: CT HEAD FINDINGS Brain: Diffuse cerebral atrophy. Ventricular dilatation consistent with central atrophy. Low-attenuation changes in the deep white matter consistent with small vessel ischemia. No mass-effect or midline shift. No abnormal extra-axial fluid collections. Gray-white matter junctions are distinct. Basal cisterns are not effaced. No acute intracranial hemorrhage. Vascular: No hyperdense vessel or unexpected calcification. Skull: Normal.  Negative for fracture or focal lesion. Sinuses/Orbits: Mucosal thickening in the paranasal sinuses. No acute air-fluid levels. Mastoid air cells are clear. Other: None. CT CERVICAL SPINE FINDINGS Alignment: Normal alignment of the cervical vertebrae and facet joints. C1-2 articulation appears intact. Skull base and vertebrae: Skull base is intact. No vertebral compression deformities. No focal bone lesion or bone destruction. Bone cortex appears intact. Soft tissues and spinal canal: No paraspinal soft tissue mass or infiltration. No prevertebral soft tissue swelling. Disc levels:  Intervertebral disc space heights are preserved. Upper chest: Several nodular opacities in the right lung apex are new since previous study and may represent nodular areas of  infiltration. Largest measures about 6 x 10 mm. Recommend three-month follow-up to exclude pulmonary nodule. Surgical clips in the base of the neck. Other: None. IMPRESSION: 1. No acute intracranial abnormalities. Chronic atrophy and small vessel ischemic changes. 2. Normal alignment of the cervical spine. No acute displaced fractures identified. 3. Several nodular opacities in the right lung apex are new since previous study and may represent nodular areas of infiltration. Recommend three-month follow-up to exclude pulmonary nodule. Electronically Signed   By: Lucienne Capers M.D.   On: 10/31/2018 02:11   Dg Chest Port 1 View  Result Date: 10/31/2018 CLINICAL DATA:  Fall. EXAM: PORTABLE CHEST 1 VIEW COMPARISON:  CT chest 09/03/2017 FINDINGS: Shallow inspiration with linear atelectasis or fibrosis in the lung bases. No consolidation or airspace disease. No blunting of costophrenic angles. No pneumothorax. Mediastinal contours appear intact. Normal heart size and pulmonary vascularity. IMPRESSION: Shallow inspiration with linear atelectasis or fibrosis in the lung bases. Electronically Signed   By: Lucienne Capers M.D.   On: 10/31/2018 01:42     EKG:  Independently reviewed.  Sinus rhythm, QTC 436, early R wave progression, no ST or T wave change for ischemia.  Assessment/Plan Principal Problem:   Acute metabolic encephalopathy Active Problems:   Myotonic muscular dystrophy ([place])   GERD   Hypothyroidism   ADD (attention deficit disorder)   Depression   Pulmonary nodule   AKI (acute kidney injury) ([place])   Generalized weakness   Fall   Acute metabolic encephalopathy: Etiology is not clear, likely multifactorial etiology, including worsening renal function, dehydration, possible UTI.  No focal neurologic findings on physical examination.  CT head is negative.  -Placed on telemetry bed of observation -Frequent neuro check -IV fluid hydration -Treat possible UTI -check ammonia level (has hx of fatty liver)  Possible UTI: Patient does not have symptoms for UTI, but has generalized weakness.  Urinalysis is not typical for UTI, but showed mild pyuria. -IV Rocephin was started in the ED, will continue -Follow-up blood culture and urine culture.  If culture negative-->will discontinue antibiotics.  Myotonic muscular dystrophy [facility]): Patient is followed up with neurologist, Dr. Tillman Abide at [facility].  Per his wife, patient has been gradually declining in the past several months. -Follow-up with urologist  Generalized weakness: Likely multifactorial multifactorial etiology, including dehydration, worsening renal function, possible UTI in the setting of myotonic muscular dystrophy. -PT/OT - Treat underlying issues respectively -f/u TSH to make sure hypothyroidism is under controlled  Fall: Likely due to multifactorial etiology as above.  No significant injury.  CT head and CT of C-spine negative for acute issues. -PT/OT  GERD: -Protonix  Hypothyroidism: Secondary hypothyroidism after thyroidectomy because of thyroid cancer. TSH was 6.17 on 07/17/18 which was not at goal (goal should be <2.0). Per his wife, his PCP made adjustment  of his Synthroid dose in July. -Continue home dose of Synthroid -Check TSH. If not surprised--> will increase the dose  ADD (attention deficit disorder) and depression: -Continue Adderall and Prozac  Pulmonary nodule: this is a known issue -f/u with PCP  AKI: Likely due to prerenal secondary to dehydration - IVFL 1L NS, then 125 cc/h - Follow up renal function by BMP - avoid using renal toxic medications   DVT ppx: SQ Lovenox Code Status: Full code Family Communication:   Yes, patient's wife and son at bed side Disposition Plan:  Anticipate discharge back to previous home environment Consults called:  none Admission status: Obs / tele    Date of  Service 10/31/2018    Ivor Costa [facility] Pager [phone]  If 7PM-7AM, please contact night-coverage [email or web address] Password [facility] 10/31/2018, 4:33 AM

## 2018-10-31 NOTE — Progress Notes (Signed)
This nurse attempting to assess patient and patient not cooperating.Patient asking to be left alone.Patient stated," I just got in comfortable bed leave me alone ." This nurse tried to explain to patient an assessment is needed to document any changes from emergency department.Patient stated,"I'm gonna rest now.It can be done later." Charge nurse [place] notified.

## 2018-10-31 NOTE — Progress Notes (Signed)
  Patient seen and evaluated, chart reviewed, please see EMR for updated orders. Please see full H&P dictated by admitting physician Dr. Blaine Hamper for same date of service.     57 y.o. male with medical history significant of myotonic dystrophy, GERD, secondary hypothyroidism after thyroidectomy because of thyroid cancer, depression, ADHD, OSA not on CPAP, fatty liver, hyperlipidemia, pulmonary nodules, who presents with generalized weakness, falls and confusion admitted on 10/31/2018    Plan--awaiting insurance approval for transfer to [place] rehab    Patient seen and evaluated, chart reviewed, please see EMR for updated orders. Please see full H&P dictated by admitting physician Dr. Blaine Hamper for same date of service.

## 2018-10-31 NOTE — Progress Notes (Signed)
Rehab Admissions Coordinator Note:  Patient was screened by Cleatrice Burke for appropriateness for an Inpatient Acute Rehab Consult per PT recommendation.  At this time, we are recommending Inpatient Rehab consult it pt would like to be considered for admit. Please advise.  Danne Baxter, RN, MSN Rehab Admissions Coordinator [phone] 10/31/2018 2:42 PM

## 2018-10-31 NOTE — ED Provider Notes (Signed)
[place] [facility] EMERGENCY DEPARTMENT Provider Note   CSN: [id number] Arrival date & time: 10/30/18  2355     History   Chief Complaint Chief Complaint  Patient presents with  . Weakness   Level 5 caveat due to altered mental status HPI [name] is a 57 y.o. male.  The history is provided by the patient and the spouse. The history is limited by the condition of the patient.  Weakness  This is a new problem. The current episode started 12 to 24 hours ago. The problem has been gradually worsening. There was no focality noted. There has been no fever. Associated symptoms include altered mental status and confusion. Associated medical issues do not include seizures.  PT with history of myotonic dystrophy History is provided by wife due to confusion in patient.  It is reported over the past 24 hours patient has had increasing falls.  Family also reports the patient's been having increasing confusion, decreased responsiveness of time.  Due to his history, he does fall frequently but he is fallen over 4 times in the past day which is unusual.  Wife  also noted some bruising to his head.  No fevers or vomiting.  No new medications. No Seizures reported He did report some abdominal and back pain recently, but none at this time. Past Medical History:  Diagnosis Date  . Adult ADHD   . Aspiration pneumonitis ([place]) 04/2017, 08/2017   Recurrent  . Chronic respiratory failure ([place])    As of 04/2018, Dr. Chase Caller suspects hypercapnic RF--but [name] normal.  May eventually need nocturnal noninvasive ventilation.  Ultimately, if progresses enough pt will need trach.  . Constipation, chronic    Has caused irritative urinary/bladder symptoms in the past.  . Depression   . Dizziness 06/2011+   Noncontrast CT and noncontrast MRI both negative 06/2011 at [facility] ED visit.  . Elevated transaminase level    Liver biopsy revealed fatty liver  . Erectile dysfunction   . Facial fracture ([place])  06/2011   Nondisplaced bilateral [place] I fracture: no surgery required. (ENT, Dr. Wilburn Cornelia)   . Fatty liver disease, nonalcoholic   . GERD (gastroesophageal reflux disease)   . History of thyroid cancer 2010   Medullary carcinoma of thyroid. Thyroidectomy 08/2009 (Dr. Aida Puffer at [facility]).  Needs periodic serum calcitonin monitoring.  Marland Kitchen Hyperlipidemia   . Hypothyroidism   . LVH (left ventricular hypertrophy)    by EKG criteria 06/2011.  Hx of heart murmur--Followed by cardiology at [facility]: EF's have been "stable" per cardio office note 12/29/10.  His echo 12/2012 showed normal LV size and wall thickness but EF 45-50% and global LV hypokinesis was found.  . 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  Initial dx by Dr. Jannifer Franklin, neurologist, but subsequent annual specialist f/u has been with Dr. Ladon Applebaum [facility].  Progressive debilitation--PT referral 04/2017, pt quit PT 05/2017.  . Nephrolithiasis   . Obesity, Class I, BMI 30-34.9   . OSA (obstructive sleep apnea)    does not wear CPAP  . Pulmonary nodule, right 05/2017   RUL, 7 mm sub solid nodule.  Resolution noted on f/u CT 08/2017.    Patient Active Problem List   Diagnosis Date Noted  . Aspiration pneumonia ([place]) 05/09/2017  . History of fall 05/09/2017  . Viral gastroenteritis 03/13/2014  . Autonomic dysfunction 09/05/2013  . Tachycardia 02/20/2013  . ADD (attention deficit disorder) 08/15/2012  . Depression 08/15/2012  .  History of thyroid cancer   . Dizziness, nonspecific 12/07/2011  . Hypothyroidism 12/07/2011  . DEPRESSION 03/20/2008  . MYOTONIC MUSCULAR DYSTROPHY 03/20/2008  . GERD 03/20/2008  . SLEEP APNEA 03/20/2008    Past Surgical History:  Procedure Laterality Date  . COLONOSCOPY  09/13/2007   normal (Dr. Deatra Ina).  . EXTRACORPOREAL SHOCK WAVE LITHOTRIPSY    . THYROIDECTOMY  08/2009   for medullary thyroid cancer ([facility]. Conley Canal at [facility] follows him for this).  .  TRANSTHORACIC ECHOCARDIOGRAM  12/2012; 09/2017   2014-Normal LV size and wall thickness, LV EF 45-50%, with global hypokinesis of LV.  2018- EF 50-55%, normal wall motion, grd I DD, valves normal.        Home Medications    Prior to Admission medications   Medication Sig Start Date End Date Taking? Authorizing Provider  acetaminophen (TYLENOL) 500 MG tablet Take 1,000 mg by mouth every 6 (six) hours as needed (pain).     [provider]  amphetamine-dextroamphetamine (ADDERALL) 20 MG tablet 1 tab po tid 07/17/18   [name], [name], MD  FLUoxetine (PROZAC) 20 MG tablet TAKE 1 TABLET BY MOUTH ONCE DAILY 09/14/18   [name], [name], MD  levothyroxine (SYNTHROID, LEVOTHROID) 150 MCG tablet TAKE 1 TABLET BY MOUTH ONCE DAILY EXCEPT FOR [facility] AND [facility] SKIP DOSE 07/12/18   [name], [name], MD  levothyroxine (SYNTHROID, LEVOTHROID) 150 MCG tablet TAKE 1 TABLET BY MOUTH ONCE DAILY EXCEPT FOR [facility]  SKIP DOSE 09/24/18   [name], [name], MD  omeprazole (PRILOSEC) 40 MG capsule Take 1 capsule (40 mg total) by mouth daily. 09/07/17   [name], [name], MD  traZODone (DESYREL) 100 MG tablet TAKE 1 TABLET BY MOUTH AT BEDTIME AS NEEDED FOR SLEEP. 07/04/18   [name], [name], MD    Family History Family History  Problem Relation Age of Onset  . Arthritis Mother   . Hypertension Mother   . Alcohol abuse Father   . Arthritis Father   . Hypertension Father   . Colon cancer Father   . Prostate cancer Father     Social History Social History   Tobacco Use  . Smoking status: Never Smoker  . Smokeless tobacco: Never Used  Substance Use Topics  . Alcohol use: Yes    Comment: rare beer  . Drug use: No     Allergies   Patient has no known allergies.   Review of Systems Review of Systems  Unable to perform ROS: Mental status change  Neurological: Positive for weakness.  Psychiatric/Behavioral: Positive for confusion.     Physical Exam Updated Vital Signs BP 104/62  (BP Location: Left Arm)   Pulse (!) 119   Temp (!) 97.5 F (36.4 C) (Oral)   Resp 16   Ht 1.651 m (5\' 5" )   Wt 86.2 kg   SpO2 96%   BMI 31.62 kg/m   Physical Exam CONSTITUTIONAL: Patient appears chronically ill and confused HEAD: Bruising noted to forehead, no crepitus or step-off EYES: EOMI/PERRL ENMT: Mucous membranes moist, no tongue laceration NECK: supple no meningeal signs SPINE/BACK:entire spine nontender, no bruising/crepitance/stepoffs noted to spine CV: S1/S2 noted, no murmurs/rubs/gallops noted LUNGS: Lungs are clear to auscultation bilaterally, no apparent distress ABDOMEN: soft, nontender, no rebound or guarding, bowel sounds noted throughout abdomen GU:no cva tenderness NEURO: Pt is awake/alert patient appears confused.  There is no arm or leg drift. GCS 13 EXTREMITIES: pulses normal/equal, full ROM, All other extremities/joints palpated/ranged and nontender Pelvis stable SKIN: warm, color normal  PSYCH: unAble to assess  ED Treatments / Results  Labs (all labs ordered are listed, but only abnormal results are displayed) Labs Reviewed  PROTIME-INR - Abnormal; Notable for the following components:      Result Value   Prothrombin Time 15.6 (*)    All other components within normal limits  COMPREHENSIVE METABOLIC PANEL - Abnormal; Notable for the following components:   Sodium 132 (*)    Creatinine, Ser 1.54 (*)    Albumin 3.3 (*)    GFR calc non Af Amer 48 (*)    GFR calc Af Amer 56 (*)    All other components within normal limits  URINALYSIS, ROUTINE W REFLEX MICROSCOPIC - Abnormal; Notable for the following components:   Hgb urine dipstick MODERATE (*)    Leukocytes, UA TRACE (*)    All other components within normal limits  ETHANOL  APTT  CBC  DIFFERENTIAL  RAPID URINE DRUG SCREEN, HOSP PERFORMED    EKG EKG Interpretation  Date/Time:  Wednesday October 31 2018 00:33:26 EST Ventricular Rate:  79 PR Interval:    QRS Duration: 94 QT  Interval:  380 QTC Calculation: 436 R Axis:   53 Text Interpretation:  Sinus rhythm No significant change since last tracing Confirmed by Ripley Fraise [phone]) on 10/31/2018 1:05:38 AM   Radiology Ct Head Wo Contrast  Result Date: 10/31/2018 CLINICAL DATA:  Head trauma. EXAM: CT HEAD WITHOUT CONTRAST CT CERVICAL SPINE WITHOUT CONTRAST TECHNIQUE: Multidetector CT imaging of the head and cervical spine was performed following the standard protocol without intravenous contrast. Multiplanar CT image reconstructions of the cervical spine were also generated. COMPARISON:  MRI brain 07/15/2011. CT head 07/15/2011. CT head and cervical spine 07/08/2011 FINDINGS: CT HEAD FINDINGS Brain: Diffuse cerebral atrophy. Ventricular dilatation consistent with central atrophy. Low-attenuation changes in the deep white matter consistent with small vessel ischemia. No mass-effect or midline shift. No abnormal extra-axial fluid collections. Gray-white matter junctions are distinct. Basal cisterns are not effaced. No acute intracranial hemorrhage. Vascular: No hyperdense vessel or unexpected calcification. Skull: Normal. Negative for fracture or focal lesion. Sinuses/Orbits: Mucosal thickening in the paranasal sinuses. No acute air-fluid levels. Mastoid air cells are clear. Other: None. CT CERVICAL SPINE FINDINGS Alignment: Normal alignment of the cervical vertebrae and facet joints. C1-2 articulation appears intact. Skull base and vertebrae: Skull base is intact. No vertebral compression deformities. No focal bone lesion or bone destruction. Bone cortex appears intact. Soft tissues and spinal canal: No paraspinal soft tissue mass or infiltration. No prevertebral soft tissue swelling. Disc levels:  Intervertebral disc space heights are preserved. Upper chest: Several nodular opacities in the right lung apex are new since previous study and may represent nodular areas of infiltration. Largest measures about 6 x 10 mm. Recommend  three-month follow-up to exclude pulmonary nodule. Surgical clips in the base of the neck. Other: None. IMPRESSION: 1. No acute intracranial abnormalities. Chronic atrophy and small vessel ischemic changes. 2. Normal alignment of the cervical spine. No acute displaced fractures identified. 3. Several nodular opacities in the right lung apex are new since previous study and may represent nodular areas of infiltration. Recommend three-month follow-up to exclude pulmonary nodule. Electronically Signed   By: Lucienne Capers M.D.   On: 10/31/2018 02:11   Ct Cervical Spine Wo Contrast  Result Date: 10/31/2018 CLINICAL DATA:  Head trauma. EXAM: CT HEAD WITHOUT CONTRAST CT CERVICAL SPINE WITHOUT CONTRAST TECHNIQUE: Multidetector CT imaging of the head and cervical spine was performed following the standard protocol without  intravenous contrast. Multiplanar CT image reconstructions of the cervical spine were also generated. COMPARISON:  MRI brain 07/15/2011. CT head 07/15/2011. CT head and cervical spine 07/08/2011 FINDINGS: CT HEAD FINDINGS Brain: Diffuse cerebral atrophy. Ventricular dilatation consistent with central atrophy. Low-attenuation changes in the deep white matter consistent with small vessel ischemia. No mass-effect or midline shift. No abnormal extra-axial fluid collections. Gray-white matter junctions are distinct. Basal cisterns are not effaced. No acute intracranial hemorrhage. Vascular: No hyperdense vessel or unexpected calcification. Skull: Normal. Negative for fracture or focal lesion. Sinuses/Orbits: Mucosal thickening in the paranasal sinuses. No acute air-fluid levels. Mastoid air cells are clear. Other: None. CT CERVICAL SPINE FINDINGS Alignment: Normal alignment of the cervical vertebrae and facet joints. C1-2 articulation appears intact. Skull base and vertebrae: Skull base is intact. No vertebral compression deformities. No focal bone lesion or bone destruction. Bone cortex appears intact.  Soft tissues and spinal canal: No paraspinal soft tissue mass or infiltration. No prevertebral soft tissue swelling. Disc levels:  Intervertebral disc space heights are preserved. Upper chest: Several nodular opacities in the right lung apex are new since previous study and may represent nodular areas of infiltration. Largest measures about 6 x 10 mm. Recommend three-month follow-up to exclude pulmonary nodule. Surgical clips in the base of the neck. Other: None. IMPRESSION: 1. No acute intracranial abnormalities. Chronic atrophy and small vessel ischemic changes. 2. Normal alignment of the cervical spine. No acute displaced fractures identified. 3. Several nodular opacities in the right lung apex are new since previous study and may represent nodular areas of infiltration. Recommend three-month follow-up to exclude pulmonary nodule. Electronically Signed   By: Lucienne Capers M.D.   On: 10/31/2018 02:11   Dg Chest Port 1 View  Result Date: 10/31/2018 CLINICAL DATA:  Fall. EXAM: PORTABLE CHEST 1 VIEW COMPARISON:  CT chest 09/03/2017 FINDINGS: Shallow inspiration with linear atelectasis or fibrosis in the lung bases. No consolidation or airspace disease. No blunting of costophrenic angles. No pneumothorax. Mediastinal contours appear intact. Normal heart size and pulmonary vascularity. IMPRESSION: Shallow inspiration with linear atelectasis or fibrosis in the lung bases. Electronically Signed   By: Lucienne Capers M.D.   On: 10/31/2018 01:42    Procedures Procedures   Medications Ordered in ED Medications  sodium chloride 0.9 % bolus 1,000 mL (1,000 mLs Intravenous New Bag/Given 10/31/18 0341)  cefTRIAXone (ROCEPHIN) 1 g in sodium chloride 0.9 % 100 mL IVPB (1 g Intravenous New Bag/Given 10/31/18 0340)     Initial Impression / Assessment and Plan / ED Course  I have reviewed the triage vital signs and the nursing notes.  Pertinent labs & imaging results that were available during my care of the  patient were reviewed by me and considered in my medical decision making (see chart for details).     1:29 AM Patient presents with altered mental status.  Imaging/labs have been ordered.  Will follow closely 3:56 AM All imaging negative. Patient appears more alert at this time.  Vitals are appropriate BP 126/84   Pulse 81   Temp 99.5 F (37.5 C) (Rectal)   Resp 20   Ht 1.651 m (5\' 5" )   Wt 86.2 kg   SpO2 98%   BMI 31.62 kg/m  There is evidence of some dehydration as well as potential UTI.  Hematuria noted, but has no active pain to suggest ureteral colic. Due to combination of dehydration and potential UTI, as well as increased weakness, I advised admission.  Patient/family are agreeable with  plan  Discussed the case with Dr. Blaine Hamper for admission Final Clinical Impressions(s) / ED Diagnoses   Final diagnoses:  Weakness  AKI (acute kidney injury) ([place])  Acute cystitis with hematuria    ED Discharge Orders    None       Ripley Fraise, MD 10/31/18 [phone]

## 2018-10-31 NOTE — Progress Notes (Signed)
Patient arrived to unit 3w bed 21 from emergency department. Assisted patient to bed by nursing staff. Per report patient having frequent falls placed in low bed,applied yellow bracelet , yellow socks and bed bed alarm set for safety. Educated patient not to get out of bed without assistance from nursing staff.Oriented patient to nursing unit and call bell.No acute distress noted at present time.Will continue to monitor.

## 2018-11-01 DIAGNOSIS — G9341 Metabolic encephalopathy: Secondary | ICD-10-CM

## 2018-11-01 DIAGNOSIS — R1314 Dysphagia, pharyngoesophageal phase: Secondary | ICD-10-CM

## 2018-11-01 DIAGNOSIS — G7111 Myotonic muscular dystrophy: Secondary | ICD-10-CM

## 2018-11-01 LAB — HIV ANTIBODY (ROUTINE TESTING W REFLEX): HIV Screen 4th Generation wRfx: NONREACTIVE

## 2018-11-01 LAB — URINE CULTURE: CULTURE: NO GROWTH

## 2018-11-01 NOTE — Consult Note (Signed)
Physical Medicine and Rehabilitation Consult   Reason for Consult: Encephalopathy, History of myotonic dystrophy Referring Physician: Dr. Denton Brick   HPI: [name] is a 57 y.o. male with history of myotonic dystrophy with progressive weakness with dysphagia and expressive deficits, chronic respiratory failure, untreated OSA, ADHD, fatty liver; who was admitted from home with generalized weakness, multiple falls with difficulty getting off the floor and confusion x1 day.  UDS negative.  He was noted to be dehydrated and was treated with fluid bolus with improvement in mentation.  CT head and cervical spine negative for acute changes of fracture.  Chest x-ray and UA negative for infection.  Ammonia levels within normal limits.  PT evaluation done revealing confused mumbling speech, poor judgment, tachycardia with activity as well as cognitive deficits.  CIR recommended due to functional deficits  Patient sedentary with daytime fatigue and shortness of breath but reported to be independent prior to admission.  He stays/sleeps in the living on the couch and goes up the to shower about once a week.  Wife works in Scientist, research (medical) at [employer] from home few days a week.  Son (unemployed at this time) can provide supervision with wife at work.     Review of Systems  Constitutional: Negative for chills and fever.  HENT: Negative for hearing loss and tinnitus.   Eyes: Negative for blurred vision and double vision.  Respiratory: Positive for cough (chronic). Negative for shortness of breath.   Cardiovascular: Negative for chest pain and palpitations.  Gastrointestinal: Negative for abdominal pain and heartburn.  Musculoskeletal: Positive for falls (cluster of falls every 6 weeks . ), joint pain (left knee pain) and myalgias.  Neurological: Positive for sensory change, speech change and weakness. Negative for dizziness and headaches.  Psychiatric/Behavioral: The patient does not have  insomnia.      Past Medical History:  Diagnosis Date  . Adult ADHD   . Aspiration pneumonitis ([place]) 04/2017, 08/2017   Recurrent  . Chronic respiratory failure ([place])    As of 04/2018, Dr. Chase Caller suspects hypercapnic RF--but [name] normal.  May eventually need nocturnal noninvasive ventilation.  Ultimately, if progresses enough pt will need trach.  . Constipation, chronic    Has caused irritative urinary/bladder symptoms in the past.  . Depression   . Dizziness 06/2011+   Noncontrast CT and noncontrast MRI both negative 06/2011 at [facility] ED visit.  . Elevated transaminase level    Liver biopsy revealed fatty liver  . Erectile dysfunction   . Facial fracture ([place]) 06/2011   Nondisplaced bilateral [place] I fracture: no surgery required. (ENT, Dr. Wilburn Cornelia)   . Fatty liver disease, nonalcoholic   . GERD (gastroesophageal reflux disease)   . History of thyroid cancer 2010   Medullary carcinoma of thyroid. Thyroidectomy 08/2009 (Dr. Aida Puffer at [facility]).  Needs periodic serum calcitonin monitoring.  Marland Kitchen Hyperlipidemia   . Hypothyroidism   . LVH (left ventricular hypertrophy)    by EKG criteria 06/2011.  Hx of heart murmur--Followed by cardiology at [facility]: EF's have been "stable" per cardio office note 12/29/10.  His echo 12/2012 showed normal LV size and wall thickness but EF 45-50% and global LV hypokinesis was found.  . Myotonic dystrophy ([place]) Dx'd 1996   Progressive muscle weakness and swallowing dysfunction.  Initial dx by Dr. Jannifer Franklin, neurologist, but subsequent annual specialist f/u has been with Dr. Ladon Applebaum [facility].  Progressive debilitation--PT referral 04/2017, pt quit PT 05/2017.  . Nephrolithiasis   . Obesity, Class  I, BMI 30-34.9   . OSA (obstructive sleep apnea)    does not wear CPAP  . Pulmonary nodule, right 05/2017   RUL, 7 mm sub solid nodule.  Resolution noted on f/u CT 08/2017.    Past Surgical History:  Procedure Laterality Date  . COLONOSCOPY  09/13/2007   normal (Dr.  Deatra Ina).  . EXTRACORPOREAL SHOCK WAVE LITHOTRIPSY    . THYROIDECTOMY  08/2009   for medullary thyroid cancer (Dr. Conley Canal at [facility] follows him for this).  . TRANSTHORACIC ECHOCARDIOGRAM  12/2012; 09/2017   2014-Normal LV size and wall thickness, LV EF 45-50%, with global hypokinesis of LV.  2018- EF 50-55%, normal wall motion, grd I DD, valves normal.    Family History  Problem Relation Age of Onset  . Arthritis Mother   . Hypertension Mother   . Alcohol abuse Father   . Arthritis Father   . Hypertension Father   . Colon cancer Father   . Prostate cancer Father     Social History:  Married. Independent PTA. He reports that he has never smoked. He has never used smokeless tobacco. He reports that he does not drinks alcohol. He reports that he does not use drugs.    Allergies: No Known Allergies    Medications Prior to Admission  Medication Sig Dispense Refill  . acetaminophen (TYLENOL) 500 MG tablet Take 1,000 mg by mouth every 6 (six) hours as needed (pain).     Marland Kitchen amphetamine-dextroamphetamine (ADDERALL) 20 MG tablet 1 tab po tid (Patient taking differently: Take 20 mg by mouth 2 (two) times daily. ) 90 tablet 0  . FLUoxetine (PROZAC) 20 MG tablet TAKE 1 TABLET BY MOUTH ONCE DAILY (Patient taking differently: Take 20 mg by mouth daily. ) 30 tablet 3  . levothyroxine (SYNTHROID, LEVOTHROID) 150 MCG tablet TAKE 1 TABLET BY MOUTH ONCE DAILY EXCEPT FOR WEDNESDAY  SKIP DOSE (Patient taking differently: Take 150 mcg by mouth See admin instructions. Take 1 tablet every day except on Sunday) 30 tablet 1  . omeprazole (PRILOSEC) 40 MG capsule Take 1 capsule (40 mg total) by mouth daily. 90 capsule 3  . traZODone (DESYREL) 100 MG tablet TAKE 1 TABLET BY MOUTH AT BEDTIME AS NEEDED FOR SLEEP. (Patient taking differently: Take 100 mg by mouth at bedtime as needed for sleep. ) 30 tablet 11  . levothyroxine (SYNTHROID, LEVOTHROID) 150 MCG tablet TAKE 1 TABLET BY MOUTH ONCE DAILY EXCEPT FOR WEDNESDAY  AND FRIDAYS SKIP DOSE (Patient not taking: Reported on 10/31/2018) 30 tablet 0    Home: Home Living Family/patient expects to be discharged to:: Private residence Living Arrangements: Spouse/significant other Available Help at Discharge: Family, Available 24 hours/day Type of Home: House Home Access: Stairs to enter Technical brewer of Steps: 5 Entrance Stairs-Rails: Right, Left Home Layout: Two level, Bed/bath upstairs Alternate Level Stairs-Number of Steps: flight Alternate Level Stairs-Rails: Right, Left Bathroom Shower/Tub: Tub/shower unit Home Equipment: Other (comment), Shower seat(has access to a walker but unsure if it is appropriate)  Functional History: Prior Function Level of Independence: Independent Comments: pt reports was ambulating without AD and completing ADLs without assist Functional Status:  Mobility: Bed Mobility Overal bed mobility: Needs Assistance Bed Mobility: Supine to Sit Supine to sit: Min assist General bed mobility comments: increased time and effort, assist for trunk elevation Transfers Overall transfer level: Needs assistance Equipment used: Rolling walker (2 wheeled), None Transfers: Sit to/from Stand Sit to Stand: Mod assist, +2 physical assistance General transfer comment: increased time and effort,  multimodal cueing for safety and technique, assist to power into standing from EOB x1 and from toilet x1 Ambulation/Gait Ambulation/Gait assistance: Min assist, +2 safety/equipment, +2 physical assistance Gait Distance (Feet): 75 Feet Assistive device: Rolling walker (2 wheeled) Gait Pattern/deviations: Step-through pattern, Decreased step length - right, Decreased step length - left, Decreased stride length, Shuffle, Drifts right/left General Gait Details: pt with modest instability requiring constant min A x2 for safety; pt impulsive and requiring frequent cueing for safety and to maintain a safe distance from RW. pt with HR increasing to  as high as 131 bpm, pt asymptomatic throughout Gait velocity: decreased    ADL: ADL Overall ADL's : Needs assistance/impaired Eating/Feeding: Set up, Sitting Grooming: Set up, Min guard, Sitting Upper Body Bathing: Minimal assistance, Sitting Lower Body Bathing: Moderate assistance, +2 for physical assistance, +2 for safety/equipment, Sit to/from stand Upper Body Dressing : Minimal assistance, Sitting Lower Body Dressing: Moderate assistance, +2 for physical assistance, +2 for safety/equipment, Sit to/from stand Lower Body Dressing Details (indicate cue type and reason): pt able to don L sock, able to perform figure 4 technique with RLE though unable to maintain while donning sock, requires assist to complete Toilet Transfer: Minimal assistance, +2 for physical assistance, +2 for safety/equipment, RW, BSC Toilet Transfer Details (indicate cue type and reason): BSC over toilet; due to limited space in bathroom had to transition from RW with +2 assist to HHA+2 Toileting- Clothing Manipulation and Hygiene: Minimal assistance, Sit to/from stand, +2 for physical assistance, +2 for safety/equipment Toileting - Clothing Manipulation Details (indicate cue type and reason): assist for gown/brief management Functional mobility during ADLs: Minimal assistance, +2 for physical assistance, +2 for safety/equipment, Rolling walker  Cognition: Cognition Overall Cognitive Status: No family/caregiver present to determine baseline cognitive functioning Orientation Level: Oriented to place, Oriented to situation, Oriented to person Cognition Arousal/Alertness: Awake/alert Behavior During Therapy: Impulsive Overall Cognitive Status: No family/caregiver present to determine baseline cognitive functioning Area of Impairment: Attention, Following commands, Safety/judgement, Awareness Current Attention Level: Selective Following Commands: Follows one step commands with increased time, Follows one step commands  inconsistently Safety/Judgement: Decreased awareness of safety Awareness: Emergent General Comments: pt impulsive during mobility and completion of functional tasks requiring increased cues for safety; requires increased time and multimodal cues to sequence/follow one step commands. Pt with difficulty completing dual task, when pt responding to questions while walking noted pt often having to stop and stand still prior to responding   Blood pressure (!) 145/65, pulse 78, temperature 97.9 F (36.6 C), temperature source Oral, resp. rate 18, height 5\' 5"  (1.651 m), weight 86.2 kg, SpO2 95 %. Physical Exam  Nursing note and vitals reviewed. Constitutional: He is oriented to person, place, and time. He appears well-developed and well-nourished.  Cardiovascular: Normal rate and regular rhythm.  Respiratory: Effort normal and breath sounds normal. No stridor. No respiratory distress. He has no wheezes.  GI: Soft. Bowel sounds are normal. He exhibits no distension. There is tenderness.  Neurological: He is alert and oriented to person, place, and time. He displays no tremor. No sensory deficit. Coordination abnormal.  Flat affect with low voice. Does not make eye contact. Slow to process with occasional inappropriate comments.  Mild facial diplegia has difficulty following commands needs step-by-step instructions, motor strength is 4/5 bilateral deltoid bicep tricep grip hip flexor knee extensor ankle dorsiflexor Sensation intact pinprick and light touch bilateral upper and lower limbs No myotonia elicited with grasp and release, no percussion myotonia. Poor fine motor dexterity in both  hands  Skin: Skin is warm and dry.  Psychiatric: His affect is blunt. His speech is delayed and slurred. He is slowed and withdrawn. Cognition and memory are impaired.    Results for orders placed or performed during the hospital encounter of 10/30/18 (from the past 24 hour(s))  Ammonia     Status: None    Collection Time: 10/31/18 11:04 AM  Result Value Ref Range   Ammonia 35 9 - 35 umol/L  HIV antibody (Routine Testing)     Status: None   Collection Time: 10/31/18 11:04 AM  Result Value Ref Range   HIV Screen 4th Generation wRfx Non Reactive Non Reactive  Basic metabolic panel     Status: None   Collection Time: 10/31/18 11:04 AM  Result Value Ref Range   Sodium 137 135 - 145 mmol/L   Potassium 3.5 3.5 - 5.1 mmol/L   Chloride 102 98 - 111 mmol/L   CO2 26 22 - 32 mmol/L   Glucose, Bld 79 70 - 99 mg/dL   BUN 8 6 - 20 mg/dL   Creatinine, Ser 1.18 0.61 - 1.24 mg/dL   Calcium 9.2 8.9 - 10.3 mg/dL   GFR calc non Af Amer >60 >60 mL/min   GFR calc Af Amer >60 >60 mL/min   Anion gap 9 5 - 15  CBC     Status: Abnormal   Collection Time: 10/31/18 11:04 AM  Result Value Ref Range   WBC 5.4 4.0 - 10.5 K/uL   RBC 4.68 4.22 - 5.81 MIL/uL   Hemoglobin 13.4 13.0 - 17.0 g/dL   HCT 43.9 39.0 - 52.0 %   MCV 93.8 80.0 - 100.0 fL   MCH 28.6 26.0 - 34.0 pg   MCHC 30.5 30.0 - 36.0 g/dL   RDW 14.4 11.5 - 15.5 %   Platelets 140 (L) 150 - 400 K/uL   nRBC 0.0 0.0 - 0.2 %  TSH     Status: None   Collection Time: 10/31/18 11:04 AM  Result Value Ref Range   TSH 2.335 0.350 - 4.500 uIU/mL   Ct Head Wo Contrast  Result Date: 10/31/2018 CLINICAL DATA:  Head trauma. EXAM: CT HEAD WITHOUT CONTRAST CT CERVICAL SPINE WITHOUT CONTRAST TECHNIQUE: Multidetector CT imaging of the head and cervical spine was performed following the standard protocol without intravenous contrast. Multiplanar CT image reconstructions of the cervical spine were also generated. COMPARISON:  MRI brain 07/15/2011. CT head 07/15/2011. CT head and cervical spine 07/08/2011 FINDINGS: CT HEAD FINDINGS Brain: Diffuse cerebral atrophy. Ventricular dilatation consistent with central atrophy. Low-attenuation changes in the deep white matter consistent with small vessel ischemia. No mass-effect or midline shift. No abnormal extra-axial fluid  collections. Gray-white matter junctions are distinct. Basal cisterns are not effaced. No acute intracranial hemorrhage. Vascular: No hyperdense vessel or unexpected calcification. Skull: Normal. Negative for fracture or focal lesion. Sinuses/Orbits: Mucosal thickening in the paranasal sinuses. No acute air-fluid levels. Mastoid air cells are clear. Other: None. CT CERVICAL SPINE FINDINGS Alignment: Normal alignment of the cervical vertebrae and facet joints. C1-2 articulation appears intact. Skull base and vertebrae: Skull base is intact. No vertebral compression deformities. No focal bone lesion or bone destruction. Bone cortex appears intact. Soft tissues and spinal canal: No paraspinal soft tissue mass or infiltration. No prevertebral soft tissue swelling. Disc levels:  Intervertebral disc space heights are preserved. Upper chest: Several nodular opacities in the right lung apex are new since previous study and may represent nodular areas of infiltration. Largest  measures about 6 x 10 mm. Recommend three-month follow-up to exclude pulmonary nodule. Surgical clips in the base of the neck. Other: None. IMPRESSION: 1. No acute intracranial abnormalities. Chronic atrophy and small vessel ischemic changes. 2. Normal alignment of the cervical spine. No acute displaced fractures identified. 3. Several nodular opacities in the right lung apex are new since previous study and may represent nodular areas of infiltration. Recommend three-month follow-up to exclude pulmonary nodule. Electronically Signed   By: Lucienne Capers M.D.   On: 10/31/2018 02:11   Ct Cervical Spine Wo Contrast  Result Date: 10/31/2018 CLINICAL DATA:  Head trauma. EXAM: CT HEAD WITHOUT CONTRAST CT CERVICAL SPINE WITHOUT CONTRAST TECHNIQUE: Multidetector CT imaging of the head and cervical spine was performed following the standard protocol without intravenous contrast. Multiplanar CT image reconstructions of the cervical spine were also  generated. COMPARISON:  MRI brain 07/15/2011. CT head 07/15/2011. CT head and cervical spine 07/08/2011 FINDINGS: CT HEAD FINDINGS Brain: Diffuse cerebral atrophy. Ventricular dilatation consistent with central atrophy. Low-attenuation changes in the deep white matter consistent with small vessel ischemia. No mass-effect or midline shift. No abnormal extra-axial fluid collections. Gray-white matter junctions are distinct. Basal cisterns are not effaced. No acute intracranial hemorrhage. Vascular: No hyperdense vessel or unexpected calcification. Skull: Normal. Negative for fracture or focal lesion. Sinuses/Orbits: Mucosal thickening in the paranasal sinuses. No acute air-fluid levels. Mastoid air cells are clear. Other: None. CT CERVICAL SPINE FINDINGS Alignment: Normal alignment of the cervical vertebrae and facet joints. C1-2 articulation appears intact. Skull base and vertebrae: Skull base is intact. No vertebral compression deformities. No focal bone lesion or bone destruction. Bone cortex appears intact. Soft tissues and spinal canal: No paraspinal soft tissue mass or infiltration. No prevertebral soft tissue swelling. Disc levels:  Intervertebral disc space heights are preserved. Upper chest: Several nodular opacities in the right lung apex are new since previous study and may represent nodular areas of infiltration. Largest measures about 6 x 10 mm. Recommend three-month follow-up to exclude pulmonary nodule. Surgical clips in the base of the neck. Other: None. IMPRESSION: 1. No acute intracranial abnormalities. Chronic atrophy and small vessel ischemic changes. 2. Normal alignment of the cervical spine. No acute displaced fractures identified. 3. Several nodular opacities in the right lung apex are new since previous study and may represent nodular areas of infiltration. Recommend three-month follow-up to exclude pulmonary nodule. Electronically Signed   By: Lucienne Capers M.D.   On: 10/31/2018 02:11    Dg Chest Port 1 View  Result Date: 10/31/2018 CLINICAL DATA:  Fall. EXAM: PORTABLE CHEST 1 VIEW COMPARISON:  CT chest 09/03/2017 FINDINGS: Shallow inspiration with linear atelectasis or fibrosis in the lung bases. No consolidation or airspace disease. No blunting of costophrenic angles. No pneumothorax. Mediastinal contours appear intact. Normal heart size and pulmonary vascularity. IMPRESSION: Shallow inspiration with linear atelectasis or fibrosis in the lung bases. Electronically Signed   By: Lucienne Capers M.D.   On: 10/31/2018 01:42     Assessment/Plan: Diagnosis: Decline in functional mobility and ADL skills following multiple falls, question progression of myotonic dystrophy 1. Does the need for close, 24 hr/day medical supervision in concert with the patient's rehab needs make it unreasonable for this patient to be served in a less intensive setting? Yes 2. Co-Morbidities requiring supervision/potential complications: Respiratory failure, chronic dysphagia, cognitive deficits related to myotonic dystrophy 3. Due to bladder management, bowel management, safety, skin/wound care, disease management, medication administration, pain management and patient education, does the patient  require 24 hr/day rehab nursing? Yes 4. Does the patient require coordinated care of a physician, rehab nurse, PT (1-2 hrs/day, 5 days/week), OT (1-2 hrs/day, 5 days/week) and SLP (.5-1 hrs/day, 5 days/week) to address physical and functional deficits in the context of the above medical diagnosis(es)? Yes Addressing deficits in the following areas: balance, endurance, locomotion, strength, transferring, bowel/bladder control, bathing, dressing, feeding, grooming, toileting, cognition, speech, language, swallowing and psychosocial support 5. Can the patient actively participate in an intensive therapy program of at least 3 hrs of therapy per day at least 5 days per week? Yes 6. The potential for patient to make  measurable gains while on inpatient rehab is good 7. Anticipated functional outcomes upon discharge from inpatient rehab are modified independent and supervision  with PT, modified independent and supervision with OT, modified independent and supervision with SLP. 8. Estimated rehab length of stay to reach the above functional goals is: 8-12d 9. Anticipated D/C setting: Home 10. Anticipated post D/C treatments: [facility] therapy 11. Overall Rehab/Functional Prognosis: good  RECOMMENDATIONS: This patient's condition is appropriate for continued rehabilitative care in the following setting: CIR Patient has agreed to participate in recommended program. Yes Note that insurance prior authorization may be required for reimbursement for recommended care.  Comment:  "I have personally performed a face to face diagnostic evaluation of this patient.  Additionally, I have reviewed and concur with the physician assistant's documentation above." Charlett Blake M.D. [facility] FAAPM&R (Sports Med, Neuromuscular Med) Diplomate Am Board of [place], PA-C 11/01/2018

## 2018-11-01 NOTE — Progress Notes (Signed)
Physical Therapy Treatment Patient Details Name: [name] MRN: [id number] DOB: Feb 28, 1961 Today's Date: 11/01/2018    History of Present Illness Pt is a 57 y.o. male with medical history significant of myotonic dystrophy, GERD, secondary hypothyroidism after thyroidectomy because of thyroid cancer, depression, ADHD, OSA not on CPAP, fatty liver, hyperlipidemia, pulmonary nodules, who presents with generalized weakness, falls and confusion. CT of head and C-spine negative for acute issues, but showed several pulmonary nodules in the right apex area.    PT Comments    Pt seen for mobility progression. Pt was limited this session secondary to L LE pain. Pt's spouse present during session, very attentive and eager to assist. Pt would continue to benefit from skilled physical therapy services at this time while admitted and after d/c to address the below listed limitations in order to improve overall safety and independence with functional mobility.    Follow Up Recommendations  CIR     Equipment Recommendations  Rolling walker with 5" wheels    Recommendations for Other Services       Precautions / Restrictions Precautions Precautions: Fall Restrictions Weight Bearing Restrictions: No    Mobility  Bed Mobility Overal bed mobility: Needs Assistance Bed Mobility: Supine to Sit     Supine to sit: Min guard     General bed mobility comments: increased time and effort, min guard for safety  Transfers Overall transfer level: Needs assistance Equipment used: Rolling walker (2 wheeled) Transfers: Sit to/from Stand Sit to Stand: Min assist         General transfer comment: increased time and effort, multimodal cueing for safety and technique, assist to power into standing from EOB  Ambulation/Gait Ambulation/Gait assistance: Min assist Gait Distance (Feet): 10 Feet Assistive device: Rolling walker (2 wheeled) Gait Pattern/deviations: Step-to pattern;Step-through  pattern;Decreased step length - right;Decreased step length - left;Decreased stride length;Shuffle Gait velocity: decreased   General Gait Details: pt with increased fatigue and decreased tolerance for ambulation, very limited with distance. Pt also with reported pain in L LE and requesting to sit down   Stairs             Wheelchair Mobility    Modified Rankin (Stroke Patients Only) Modified Rankin (Stroke Patients Only) Pre-Morbid Rankin Score: No symptoms Modified Rankin: Moderately severe disability     Balance Overall balance assessment: Needs assistance Sitting-balance support: Feet supported Sitting balance-Leahy Scale: Good     Standing balance support: During functional activity;Bilateral upper extremity supported Standing balance-Leahy Scale: Poor                              Cognition Arousal/Alertness: Awake/alert Behavior During Therapy: Flat affect Overall Cognitive Status: Impaired/Different from baseline Area of Impairment: Attention;Memory;Following commands;Safety/judgement;Problem solving                   Current Attention Level: Selective Memory: Decreased short-term memory Following Commands: Follows one step commands with increased time;Follows one step commands inconsistently Safety/Judgement: Decreased awareness of safety   Problem Solving: Difficulty sequencing;Requires verbal cues        Exercises      General Comments        Pertinent Vitals/Pain Pain Assessment: No/denies pain    Home Living                      Prior Function            PT Goals (  current goals can now be found in the care plan section) Acute Rehab PT Goals PT Goal Formulation: With patient Time For Goal Achievement: 11/14/18 Potential to Achieve Goals: Fair Progress towards PT goals: Progressing toward goals    Frequency    Min 3X/week      PT Plan Current plan remains appropriate    Co-evaluation               AM-PAC PT "6 Clicks" Daily Activity  Outcome Measure  Difficulty turning over in bed (including adjusting bedclothes, sheets and blankets)?: A Lot Difficulty moving from lying on back to sitting on the side of the bed? : A Lot Difficulty sitting down on and standing up from a chair with arms (e.g., wheelchair, bedside commode, etc,.)?: Unable Help needed moving to and from a bed to chair (including a wheelchair)?: A Little Help needed walking in hospital room?: A Little Help needed climbing 3-5 steps with a railing? : A Lot 6 Click Score: 13    End of Session Equipment Utilized During Treatment: Gait belt Activity Tolerance: Patient limited by pain Patient left: in chair;with call bell/phone within reach;with family/visitor present;Other (comment)(telesitter) Nurse Communication: Mobility status;Other (comment)(IV site leaking) PT Visit Diagnosis: Other abnormalities of gait and mobility (R26.89);Muscle weakness (generalized) (M62.81)     Time: 7564-3329 PT Time Calculation (min) (ACUTE ONLY): 23 min  Charges:  $Gait Training: 8-22 mins $Therapeutic Activity: 8-22 mins                     Sherie Don, PT, DPT  Acute Rehabilitation Services Pager [phone] Office [place] 11/01/2018, 12:12 PM

## 2018-11-01 NOTE — Telephone Encounter (Signed)
PA submitted.

## 2018-11-01 NOTE — Progress Notes (Signed)
Patient Demographics:    [name], is a 57 y.o. male, DOB - 30-Oct-1961, CWC:[id number]  Admit date - 10/30/2018   Admitting Physician Ivor Costa, MD  Outpatient Primary MD for the patient is [name], [name], MD  LOS - 0   Chief Complaint  Patient presents with  . Weakness        Subjective:    [name] today has no fevers, no emesis,  No chest pain, wife at bedside, questions answered, appetite and oral intake remains poor,   Assessment  & Plan :    Principal Problem:   Acute metabolic encephalopathy Active Problems:   Myotonic muscular dystrophy ([place])   GERD   Hypothyroidism   ADD (attention deficit disorder)   Depression   Pulmonary nodule   AKI (acute kidney injury) ([place])   Generalized weakness   Fall  Brief Summary 57 y.o.malewith medical history significant ofmyotonic dystrophy,GERD, secondary hypothyroidism after thyroidectomy because of thyroid cancer, depression, ADHD, OSA not on CPAP, fatty liver, hyperlipidemia, pulmonary nodules, who presents with generalized weakness, falls and confusion admitted on 10/31/2018, blood and urine cultures negative so far awaiting insurance approval for transfer to CIR rehab    Plan--  1)Generalized weakness/debility/Recurrent Falls --- CT head, CT C-spine negative for acute findings, PT  And OT eval appreciated , rehab medicine physician as well appreciated to recommend [facility] Rehab, awaiting insurance approval for transfer to [facility] rehab  2)Possible UTI--- urine and blood cultures negative, okay to discontinue Rocephin  3)Hypothyroidism--TSH is 2.3, continue levothyroxine 150 mcg  4)Myotonic muscular Dystrophy--- followed up with neurologist, Dr. Tillman Abide at [facility]  5)Psych-- history of depression and ADD-----stable at this time, continue Prozac and Adderall, give trazodone as needed insomnia  6)Acute metabolic  encephalopathy----suspect due to dehydration and acute kidney injury, UDS negative, serum ammonia not elevated, CT head without acute findings, blood and urine culture negative so far, with hydration patient is more alert and more coherent  7)AKI--- acute kidney injury due to dehydration, on admission creatinine was 1.54, with hydration creatinine back down to 1.1, oral intake is better, decrease IV fluids to 50 ml/hr  Disposition/Need for in-Hospital Stay- patient unable to be discharged at this time due to need for IV fluids until oral intake improves, also awaiting insurance approval for transfer to [facility] rehab  Code Status : Full   Disposition Plan  : [facility] rehab  Consults  : Rehab medicine  DVT Prophylaxis  :  Lovenox   Lab Results  Component Value Date   PLT 140 (L) 10/31/2018    Inpatient Medications  Scheduled Meds: . amphetamine-dextroamphetamine  20 mg Oral BID  . enoxaparin (LOVENOX) injection  40 mg Subcutaneous Q24H  . FLUoxetine  20 mg Oral Daily  . levothyroxine  150 mcg Oral Once per day on Sun Mon Tue Thu Sat   Continuous Infusions: . sodium chloride 125 mL/hr at 11/01/18 0341  . cefTRIAXone (ROCEPHIN)  IV 1 g (11/01/18 0337)   PRN Meds:.acetaminophen **OR** acetaminophen, ondansetron (ZOFRAN) IV, senna-docusate, traZODone, zolpidem    Anti-infectives (From admission, onward)   Start     Dose/Rate Route Frequency Ordered Stop   11/01/18 0400  cefTRIAXone (ROCEPHIN) 1 g in sodium chloride 0.9 % 100 mL IVPB  1 g 200 mL/hr over 30 Minutes Intravenous Every 24 hours 10/31/18 0414 11/02/18 0600   10/31/18 0300  cefTRIAXone (ROCEPHIN) 1 g in sodium chloride 0.9 % 100 mL IVPB     1 g 200 mL/hr over 30 Minutes Intravenous  Once 10/31/18 0257 10/31/18 0422        Objective:   Vitals:   10/31/18 1922 10/31/18 2331 11/01/18 0330 11/01/18 0730  BP: 132/85 130/87 (!) 131/105 (!) 145/65  Pulse: 78   78  Resp: (!) 189   18  Temp: 98.2 F (36.8 C)   97.9 F (36.6  C)  TempSrc: Axillary   Oral  SpO2: 99% 100% 95%   Weight:      Height:        Wt Readings from Last 3 Encounters:  10/31/18 86.2 kg  07/17/18 91.7 kg  05/03/18 93.4 kg     Intake/Output Summary (Last 24 hours) at 11/01/2018 1838 Last data filed at 11/01/2018 0600 Gross per 24 hour  Intake 1393.72 ml  Output 1350 ml  Net 43.72 ml     Physical Exam Patient is examined daily including today on 11/01/18 , exams remain the same as of yesterday except that has changed   Gen:- Awake Alert,  In no apparent distress  HEENT:- [place].AT, No sclera icterus Neck-Supple Neck,No JVD,.  Lungs-  CTAB , fairly symmetrical air movement CV- S1, S2 normal, regular Abd-  +ve B.Sounds, Abd Soft, No tenderness,    Extremity/Skin:- No  edema, good pulses Psych-affect is appropriate, occasional confusional episodes and disorientation  neuro-significant neuromuscular deficits and atrophy which is not new, as per patient and his wife no new focal neuro findings  Data Review:   Micro Results Recent Results (from the past 240 hour(s))  Urine Culture     Status: None   Collection Time: 10/31/18  4:16 AM  Result Value Ref Range Status   Specimen Description URINE, RANDOM  Final   Special Requests NONE  Final   Culture   Final    NO GROWTH Performed at [facility], [address] [place], [place] [address]    Report Status 11/01/2018 FINAL  Final  Culture, blood (Routine X 2) w Reflex to ID Panel     Status: None (Preliminary result)   Collection Time: 10/31/18 11:04 AM  Result Value Ref Range Status   Specimen Description BLOOD RIGHT HAND  Final   Special Requests   Final    BOTTLES DRAWN AEROBIC ONLY Blood Culture results may not be optimal due to an inadequate volume of blood received in culture bottles   Culture   Final    NO GROWTH 1 DAY Performed at [facility], [place] [address], [place], [place] [address]    Report Status PENDING  Incomplete  Culture, blood (Routine X 2) w  Reflex to ID Panel     Status: None (Preliminary result)   Collection Time: 10/31/18 11:04 AM  Result Value Ref Range Status   Specimen Description BLOOD RIGHT WRIST  Final   Special Requests   Final    BOTTLES DRAWN AEROBIC ONLY Blood Culture results may not be optimal due to an inadequate volume of blood received in culture bottles   Culture   Final    NO GROWTH 1 DAY Performed at [facility], [place] [address], [place], [place] [address]    Report Status PENDING  Incomplete    Radiology Reports Ct Head Wo Contrast  Result  Date: 10/31/2018 CLINICAL DATA:  Head trauma. EXAM: CT HEAD WITHOUT CONTRAST CT CERVICAL SPINE WITHOUT CONTRAST TECHNIQUE: Multidetector CT imaging of the head and cervical spine was performed following the standard protocol without intravenous contrast. Multiplanar CT image reconstructions of the cervical spine were also generated. COMPARISON:  MRI brain 07/15/2011. CT head 07/15/2011. CT head and cervical spine 07/08/2011 FINDINGS: CT HEAD FINDINGS Brain: Diffuse cerebral atrophy. Ventricular dilatation consistent with central atrophy. Low-attenuation changes in the deep white matter consistent with small vessel ischemia. No mass-effect or midline shift. No abnormal extra-axial fluid collections. Gray-white matter junctions are distinct. Basal cisterns are not effaced. No acute intracranial hemorrhage. Vascular: No hyperdense vessel or unexpected calcification. Skull: Normal. Negative for fracture or focal lesion. Sinuses/Orbits: Mucosal thickening in the paranasal sinuses. No acute air-fluid levels. Mastoid air cells are clear. Other: None. CT CERVICAL SPINE FINDINGS Alignment: Normal alignment of the cervical vertebrae and facet joints. C1-2 articulation appears intact. Skull base and vertebrae: Skull base is intact. No vertebral compression deformities. No focal bone lesion or bone destruction. Bone cortex appears intact. Soft tissues and spinal canal: No paraspinal soft  tissue mass or infiltration. No prevertebral soft tissue swelling. Disc levels:  Intervertebral disc space heights are preserved. Upper chest: Several nodular opacities in the right lung apex are new since previous study and may represent nodular areas of infiltration. Largest measures about 6 x 10 mm. Recommend three-month follow-up to exclude pulmonary nodule. Surgical clips in the base of the neck. Other: None. IMPRESSION: 1. No acute intracranial abnormalities. Chronic atrophy and small vessel ischemic changes. 2. Normal alignment of the cervical spine. No acute displaced fractures identified. 3. Several nodular opacities in the right lung apex are new since previous study and may represent nodular areas of infiltration. Recommend three-month follow-up to exclude pulmonary nodule. Electronically Signed   By: Lucienne Capers M.D.   On: 10/31/2018 02:11   Ct Cervical Spine Wo Contrast  Result Date: 10/31/2018 CLINICAL DATA:  Head trauma. EXAM: CT HEAD WITHOUT CONTRAST CT CERVICAL SPINE WITHOUT CONTRAST TECHNIQUE: Multidetector CT imaging of the head and cervical spine was performed following the standard protocol without intravenous contrast. Multiplanar CT image reconstructions of the cervical spine were also generated. COMPARISON:  MRI brain 07/15/2011. CT head 07/15/2011. CT head and cervical spine 07/08/2011 FINDINGS: CT HEAD FINDINGS Brain: Diffuse cerebral atrophy. Ventricular dilatation consistent with central atrophy. Low-attenuation changes in the deep white matter consistent with small vessel ischemia. No mass-effect or midline shift. No abnormal extra-axial fluid collections. Gray-white matter junctions are distinct. Basal cisterns are not effaced. No acute intracranial hemorrhage. Vascular: No hyperdense vessel or unexpected calcification. Skull: Normal. Negative for fracture or focal lesion. Sinuses/Orbits: Mucosal thickening in the paranasal sinuses. No acute air-fluid levels. Mastoid air cells  are clear. Other: None. CT CERVICAL SPINE FINDINGS Alignment: Normal alignment of the cervical vertebrae and facet joints. C1-2 articulation appears intact. Skull base and vertebrae: Skull base is intact. No vertebral compression deformities. No focal bone lesion or bone destruction. Bone cortex appears intact. Soft tissues and spinal canal: No paraspinal soft tissue mass or infiltration. No prevertebral soft tissue swelling. Disc levels:  Intervertebral disc space heights are preserved. Upper chest: Several nodular opacities in the right lung apex are new since previous study and may represent nodular areas of infiltration. Largest measures about 6 x 10 mm. Recommend three-month follow-up to exclude pulmonary nodule. Surgical clips in the base of the neck. Other: None. IMPRESSION: 1. No acute intracranial abnormalities. Chronic atrophy  and small vessel ischemic changes. 2. Normal alignment of the cervical spine. No acute displaced fractures identified. 3. Several nodular opacities in the right lung apex are new since previous study and may represent nodular areas of infiltration. Recommend three-month follow-up to exclude pulmonary nodule. Electronically Signed   By: Lucienne Capers M.D.   On: 10/31/2018 02:11   Dg Chest Port 1 View  Result Date: 10/31/2018 CLINICAL DATA:  Fall. EXAM: PORTABLE CHEST 1 VIEW COMPARISON:  CT chest 09/03/2017 FINDINGS: Shallow inspiration with linear atelectasis or fibrosis in the lung bases. No consolidation or airspace disease. No blunting of costophrenic angles. No pneumothorax. Mediastinal contours appear intact. Normal heart size and pulmonary vascularity. IMPRESSION: Shallow inspiration with linear atelectasis or fibrosis in the lung bases. Electronically Signed   By: Lucienne Capers M.D.   On: 10/31/2018 01:42     CBC Recent Labs  Lab 10/31/18 0137 10/31/18 1104  WBC 7.9 5.4  HGB 13.9 13.4  HCT 44.7 43.9  PLT 221 140*  MCV 96.1 93.8  MCH 29.9 28.6  MCHC 31.1  30.5  RDW 14.6 14.4  LYMPHSABS 1.1  --   MONOABS 0.7  --   EOSABS 0.0  --   BASOSABS 0.0  --     Chemistries  Recent Labs  Lab 10/31/18 0137 10/31/18 1104  NA 132* 137  K 3.7 3.5  CL 98 102  CO2 26 26  GLUCOSE 96 79  BUN 8 8  CREATININE 1.54* 1.18  CALCIUM 9.5 9.2  AST 26  --   ALT 17  --   ALKPHOS 99  --   BILITOT 0.9  --    ------------------------------------------------------------------------------------------------------------------ No results for input(s): CHOL, HDL, LDLCALC, TRIG, CHOLHDL, LDLDIRECT in the last 72 hours.  No results found for: HGBA1C ------------------------------------------------------------------------------------------------------------------ Recent Labs    10/31/18 1104  TSH 2.335   ------------------------------------------------------------------------------------------------------------------ No results for input(s): VITAMINB12, FOLATE, FERRITIN, TIBC, IRON, RETICCTPCT in the last 72 hours.  Coagulation profile Recent Labs  Lab 10/31/18 0137  INR 1.25    No results for input(s): DDIMER in the last 72 hours.  Cardiac Enzymes No results for input(s): CKMB, TROPONINI, MYOGLOBIN in the last 168 hours.  Invalid input(s): CK ------------------------------------------------------------------------------------------------------------------ No results found for: BNP   Roxan Hockey M.D on 11/01/2018 at 6:38 PM  Pager---[phone] Go to [email or web address] - password [email or web address] for contact info  [facility] - Office  [phone]

## 2018-11-02 ENCOUNTER — Other Ambulatory Visit: Payer: Self-pay

## 2018-11-02 ENCOUNTER — Inpatient Hospital Stay (HOSPITAL_COMMUNITY)
Admission: RE | Admit: 2018-11-02 | Discharge: 2018-11-10 | DRG: 945 | Disposition: A | Discharge status: Home Health | Payer: 59 | Source: Intra-hospital | Attending: Physical Medicine & Rehabilitation | Admitting: Physical Medicine & Rehabilitation

## 2018-11-02 DIAGNOSIS — E785 Hyperlipidemia, unspecified: Secondary | ICD-10-CM | POA: Diagnosis present

## 2018-11-02 DIAGNOSIS — R918 Other nonspecific abnormal finding of lung field: Secondary | ICD-10-CM

## 2018-11-02 DIAGNOSIS — K760 Fatty (change of) liver, not elsewhere classified: Secondary | ICD-10-CM | POA: Diagnosis present

## 2018-11-02 DIAGNOSIS — E890 Postprocedural hypothyroidism: Secondary | ICD-10-CM | POA: Diagnosis present

## 2018-11-02 DIAGNOSIS — F329 Major depressive disorder, single episode, unspecified: Secondary | ICD-10-CM | POA: Diagnosis present

## 2018-11-02 DIAGNOSIS — R911 Solitary pulmonary nodule: Secondary | ICD-10-CM | POA: Diagnosis present

## 2018-11-02 DIAGNOSIS — R011 Cardiac murmur, unspecified: Secondary | ICD-10-CM | POA: Diagnosis present

## 2018-11-02 DIAGNOSIS — Z800 Family history of malignant neoplasm of digestive organs: Secondary | ICD-10-CM

## 2018-11-02 DIAGNOSIS — R9389 Abnormal findings on diagnostic imaging of other specified body structures: Secondary | ICD-10-CM

## 2018-11-02 DIAGNOSIS — K5909 Other constipation: Secondary | ICD-10-CM | POA: Diagnosis present

## 2018-11-02 DIAGNOSIS — J9610 Chronic respiratory failure, unspecified whether with hypoxia or hypercapnia: Secondary | ICD-10-CM | POA: Diagnosis present

## 2018-11-02 DIAGNOSIS — R296 Repeated falls: Secondary | ICD-10-CM | POA: Diagnosis present

## 2018-11-02 DIAGNOSIS — Z85850 Personal history of malignant neoplasm of thyroid: Secondary | ICD-10-CM

## 2018-11-02 DIAGNOSIS — F988 Other specified behavioral and emotional disorders with onset usually occurring in childhood and adolescence: Secondary | ICD-10-CM

## 2018-11-02 DIAGNOSIS — W19XXXD Unspecified fall, subsequent encounter: Secondary | ICD-10-CM | POA: Diagnosis not present

## 2018-11-02 DIAGNOSIS — G9340 Encephalopathy, unspecified: Secondary | ICD-10-CM | POA: Diagnosis not present

## 2018-11-02 DIAGNOSIS — F909 Attention-deficit hyperactivity disorder, unspecified type: Secondary | ICD-10-CM | POA: Diagnosis present

## 2018-11-02 DIAGNOSIS — R269 Unspecified abnormalities of gait and mobility: Secondary | ICD-10-CM | POA: Diagnosis not present

## 2018-11-02 DIAGNOSIS — Z8249 Family history of ischemic heart disease and other diseases of the circulatory system: Secondary | ICD-10-CM

## 2018-11-02 DIAGNOSIS — Z8042 Family history of malignant neoplasm of prostate: Secondary | ICD-10-CM

## 2018-11-02 DIAGNOSIS — K219 Gastro-esophageal reflux disease without esophagitis: Secondary | ICD-10-CM | POA: Diagnosis present

## 2018-11-02 DIAGNOSIS — E669 Obesity, unspecified: Secondary | ICD-10-CM | POA: Diagnosis present

## 2018-11-02 DIAGNOSIS — R942 Abnormal results of pulmonary function studies: Secondary | ICD-10-CM | POA: Diagnosis present

## 2018-11-02 DIAGNOSIS — G4733 Obstructive sleep apnea (adult) (pediatric): Secondary | ICD-10-CM | POA: Diagnosis present

## 2018-11-02 DIAGNOSIS — J984 Other disorders of lung: Secondary | ICD-10-CM | POA: Diagnosis not present

## 2018-11-02 DIAGNOSIS — Z87442 Personal history of urinary calculi: Secondary | ICD-10-CM

## 2018-11-02 DIAGNOSIS — E860 Dehydration: Secondary | ICD-10-CM | POA: Diagnosis present

## 2018-11-02 DIAGNOSIS — R531 Weakness: Secondary | ICD-10-CM | POA: Diagnosis present

## 2018-11-02 DIAGNOSIS — G4700 Insomnia, unspecified: Secondary | ICD-10-CM | POA: Diagnosis present

## 2018-11-02 DIAGNOSIS — Z8261 Family history of arthritis: Secondary | ICD-10-CM

## 2018-11-02 DIAGNOSIS — N179 Acute kidney failure, unspecified: Principal | ICD-10-CM

## 2018-11-02 DIAGNOSIS — Z811 Family history of alcohol abuse and dependence: Secondary | ICD-10-CM

## 2018-11-02 DIAGNOSIS — N3001 Acute cystitis with hematuria: Secondary | ICD-10-CM

## 2018-11-02 DIAGNOSIS — Z79890 Hormone replacement therapy (postmenopausal): Secondary | ICD-10-CM

## 2018-11-02 DIAGNOSIS — R000 Tachycardia, unspecified: Secondary | ICD-10-CM | POA: Diagnosis present

## 2018-11-02 DIAGNOSIS — D696 Thrombocytopenia, unspecified: Secondary | ICD-10-CM | POA: Diagnosis present

## 2018-11-02 DIAGNOSIS — Z6831 Body mass index (BMI) 31.0-31.9, adult: Secondary | ICD-10-CM | POA: Diagnosis not present

## 2018-11-02 DIAGNOSIS — R4189 Other symptoms and signs involving cognitive functions and awareness: Secondary | ICD-10-CM | POA: Diagnosis present

## 2018-11-02 DIAGNOSIS — G7111 Myotonic muscular dystrophy: Secondary | ICD-10-CM

## 2018-11-02 DIAGNOSIS — Z79899 Other long term (current) drug therapy: Secondary | ICD-10-CM

## 2018-11-02 DIAGNOSIS — R5381 Other malaise: Principal | ICD-10-CM

## 2018-11-02 DIAGNOSIS — G510 Bell's palsy: Secondary | ICD-10-CM | POA: Diagnosis present

## 2018-11-02 DIAGNOSIS — D72819 Decreased white blood cell count, unspecified: Secondary | ICD-10-CM | POA: Diagnosis not present

## 2018-11-02 DIAGNOSIS — R1310 Dysphagia, unspecified: Secondary | ICD-10-CM | POA: Diagnosis present

## 2018-11-02 DIAGNOSIS — Z6833 Body mass index (BMI) 33.0-33.9, adult: Secondary | ICD-10-CM

## 2018-11-02 DIAGNOSIS — G9341 Metabolic encephalopathy: Secondary | ICD-10-CM | POA: Diagnosis not present

## 2018-11-02 MED ORDER — FLEET ENEMA 7-19 GM/118ML RE ENEM: 1.0000 | ENEMA | Freq: Once | RECTAL | Status: DC | PRN

## 2018-11-02 MED ORDER — PROCHLORPERAZINE EDISYLATE 10 MG/2ML IJ SOLN: 5.0000 mg | Freq: Four times a day (QID) | INTRAMUSCULAR | Status: DC | PRN

## 2018-11-02 MED ORDER — AMPHETAMINE-DEXTROAMPHETAMINE 10 MG PO TABS
20.0000 mg | ORAL_TABLET | Freq: Two times a day (BID) | ORAL | Status: DC
  Administered 2018-11-03 – 2018-11-10 (×15): 20 mg via ORAL
  Filled 2018-11-02 (×16): qty 2

## 2018-11-02 MED ORDER — ENOXAPARIN SODIUM 40 MG/0.4ML ~~LOC~~ SOLN
40.0000 mg | SUBCUTANEOUS | Status: DC
  Administered 2018-11-03 – 2018-11-09 (×7): 40 mg via SUBCUTANEOUS
  Filled 2018-11-02 (×7): qty 0.4

## 2018-11-02 MED ORDER — TRAZODONE HCL 50 MG PO TABS
25.0000 mg | ORAL_TABLET | Freq: Every evening | ORAL | Status: DC | PRN
  Administered 2018-11-02 – 2018-11-09 (×7): 50 mg via ORAL
  Filled 2018-11-02 (×8): qty 1

## 2018-11-02 MED ORDER — GUAIFENESIN-DM 100-10 MG/5ML PO SYRP: 5.0000 mL | ORAL_SOLUTION | Freq: Four times a day (QID) | ORAL | Status: DC | PRN

## 2018-11-02 MED ORDER — DIPHENHYDRAMINE HCL 12.5 MG/5ML PO ELIX: 12.5000 mg | ORAL_SOLUTION | Freq: Four times a day (QID) | ORAL | Status: DC | PRN

## 2018-11-02 MED ORDER — LEVOTHYROXINE SODIUM 75 MCG PO TABS
150.0000 ug | ORAL_TABLET | ORAL | Status: DC
  Administered 2018-11-03 – 2018-11-10 (×6): 150 ug via ORAL
  Filled 2018-11-02 (×7): qty 2

## 2018-11-02 MED ORDER — POLYETHYLENE GLYCOL 3350 17 G PO PACK
17.0000 g | PACK | Freq: Every day | ORAL | Status: DC | PRN
  Administered 2018-11-02 – 2018-11-06 (×4): 17 g via ORAL
  Filled 2018-11-02 (×5): qty 1

## 2018-11-02 MED ORDER — ALUM & MAG HYDROXIDE-SIMETH 200-200-20 MG/5ML PO SUSP: 30.0000 mL | ORAL | Status: DC | PRN

## 2018-11-02 MED ORDER — FLUOXETINE HCL 20 MG PO CAPS
20.0000 mg | ORAL_CAPSULE | Freq: Every day | ORAL | Status: DC
  Administered 2018-11-03 – 2018-11-10 (×8): 20 mg via ORAL
  Filled 2018-11-02 (×8): qty 1

## 2018-11-02 MED ORDER — ACETAMINOPHEN 325 MG PO TABS
325.0000 mg | ORAL_TABLET | ORAL | Status: DC | PRN
  Administered 2018-11-07: 650 mg via ORAL
  Filled 2018-11-02: qty 2

## 2018-11-02 MED ORDER — PROCHLORPERAZINE MALEATE 5 MG PO TABS: 5.0000 mg | ORAL_TABLET | Freq: Four times a day (QID) | ORAL | Status: DC | PRN

## 2018-11-02 MED ORDER — ACETAMINOPHEN 500 MG PO TABS: 1000.0000 mg | ORAL_TABLET | Freq: Four times a day (QID) | ORAL | 0 refills | Status: DC | PRN

## 2018-11-02 MED ORDER — PROCHLORPERAZINE 25 MG RE SUPP: 12.5000 mg | Freq: Four times a day (QID) | RECTAL | Status: DC | PRN

## 2018-11-02 MED ORDER — BISACODYL 10 MG RE SUPP
10.0000 mg | Freq: Every day | RECTAL | Status: DC | PRN
  Administered 2018-11-04: 10 mg via RECTAL
  Filled 2018-11-02: qty 1

## 2018-11-02 NOTE — Care Management Note (Signed)
Case Management Note  Patient Details  Name: MOSI HANNOLD MRN: [id number] Date of Birth: February 15, 1961  Subjective/Objective:   Patient admitted with acute encephalopathy. He is from home with spouse.                  Action/Plan: Pt discharging to CIR today. CM signing off.   Expected Discharge Date:  11/02/18               Expected Discharge Plan:  [place]  In-House Referral:     Discharge planning Services  CM Consult  Post Acute Care Choice:    Choice offered to:     DME Arranged:    DME Agency:     HH Arranged:    HH Agency:     Status of Service:  Completed, signed off  If discussed at [employer], dates discussed:    Additional Comments:  Pollie Friar, RN 11/02/2018, 3:10 PM

## 2018-11-02 NOTE — Progress Notes (Signed)
[facility]                                                                              Patient Demographics  [name], is a 57 y.o. male, DOB - [birth year], CLE:[id number]  Admit date - 10/30/2018   Admitting Physician Ivor Costa, MD  Outpatient Primary MD for the patient is [name], [name], MD  Outpatient specialists:   LOS - 0  days   Medical records reviewed and are as summarized below:    Chief Complaint  Patient presents with  . Weakness       Brief summary   57 y.o.malewith medical history significant ofmyotonic dystrophy,GERD, secondary hypothyroidism after thyroidectomy because of thyroid cancer, depression, ADHD, OSA not on CPAP, fatty liver, hyperlipidemia, pulmonary nodules, who presents with generalized weakness, falls and confusionadmitted on 10/31/2018,  work-up negative so far, blood and urine cultures negative.  Assessment & Plan    Principal Problem:   Acute metabolic encephalopathy -Possibly due to dehydration, acute kidney injury, much improved, alert and more coherent, appears to be closer to his baseline, confirmed by his wife at the bedside. -CT head with no acute findings, blood cultures, urine cultures negative so far  Active Problems: Generalized weakness/debility with falls -Work-up negative, CT head, CT C-spine negative for acute findings. -PT evaluation recommended CIR, pending bed    Myotonic muscular dystrophy ([place]) -Follows outpatient with Dr. Tillman Abide at [facility]    AKI (acute kidney injury) [facility]) -Creatinine 1.54 at the time of admission, resolved with IV fluid hydration -Creatinine 1.1 on 11/6    Hypothyroidism after thyroidectomy for thyroid cancer   -TSH 2.3, continue Synthroid  ADD (attention deficit disorder) -Continue Adderall    Depression -Continue fluoxetine     Pulmonary nodule CT C-spine incidentally showed several nodular opacities in the right lung apex may represent nodular  areas of infiltration, recommend a 6-month follow-up to exclude pulmonary nodule  Code Status: Full CODE STATUS DVT Prophylaxis:  Lovenox  Family Communication: Discussed in detail with the patient, all imaging results, lab results explained to the patient and wife   Disposition Plan: Pending CIR  Time Spent in minutes   25 minutes  Procedures:  CT head, C-spine  Consultants:   CIR  Antimicrobials:      Medications  Scheduled Meds: . amphetamine-dextroamphetamine  20 mg Oral BID  . enoxaparin (LOVENOX) injection  40 mg Subcutaneous Q24H  . FLUoxetine  20 mg Oral Daily  . levothyroxine  150 mcg Oral Once per day on Sun Mon Tue Thu Sat   Continuous Infusions: . sodium chloride 50 mL/hr at 11/01/18 1852   PRN Meds:.acetaminophen **OR** acetaminophen, ondansetron (ZOFRAN) IV, senna-docusate, traZODone, zolpidem   Antibiotics   Anti-infectives (From admission, onward)   Start     Dose/Rate Route Frequency Ordered Stop   11/01/18 0400  cefTRIAXone (ROCEPHIN) 1 g in sodium chloride 0.9 % 100 mL IVPB     1 g 200 mL/hr over 30 Minutes Intravenous Every 24 hours 10/31/18 0414 11/02/18 0545   10/31/18 0300  cefTRIAXone (ROCEPHIN) 1 g in sodium chloride 0.9 % 100 mL IVPB  1 g 200 mL/hr over 30 Minutes Intravenous  Once 10/31/18 0257 10/31/18 0422        Subjective:   [name] was seen and examined today. Patient denies dizziness, chest pain, shortness of breath, abdominal pain, N/V/D/C, new weakness. No acute events overnight.  Wife at the bedside, alert and awake, coherent  Objective:   Vitals:   11/01/18 1944 11/02/18 0014 11/02/18 0335 11/02/18 0822  BP: 109/63 109/68 112/79 139/86  Pulse: (!) 110 86 74 77  Resp: 18 18 18 16   Temp: 98.2 F (36.8 C) (!) 97.5 F (36.4 C) 97.8 F (36.6 C) (!) 97.5 F (36.4 C)  TempSrc: Oral Oral Oral Oral  SpO2:  99% 97% 100%  Weight:      Height:       No intake or output data in the 24 hours ending 11/02/18  1045   Wt Readings from Last 3 Encounters:  10/31/18 86.2 kg  07/17/18 91.7 kg  05/03/18 93.4 kg     Exam  General: Alert and oriented x 3, NAD  Eyes:   HEENT:  Atraumatic, normocephalic  Cardiovascular: S1 S2 auscultated,. Regular rate and rhythm.  Respiratory: Clear to auscultation bilaterally, no wheezing, rales or rhonchi  Gastrointestinal: Soft, nontender, nondistended, + bowel sounds  Ext: no pedal edema bilaterally  Neuro: no new deficit  Musculoskeletal: No digital cyanosis, clubbing  Skin: No rashes  Psych: Normal affect and demeanor   Data Reviewed:  I have personally reviewed following labs and imaging studies  Micro Results Recent Results (from the past 240 hour(s))  Urine Culture     Status: None   Collection Time: 10/31/18  4:16 AM  Result Value Ref Range Status   Specimen Description URINE, RANDOM  Final   Special Requests NONE  Final   Culture   Final    NO GROWTH Performed at [facility], [address] [place], [place] [address]    Report Status 11/01/2018 FINAL  Final  Culture, blood (Routine X 2) w Reflex to ID Panel     Status: None (Preliminary result)   Collection Time: 10/31/18 11:04 AM  Result Value Ref Range Status   Specimen Description BLOOD RIGHT HAND  Final   Special Requests   Final    BOTTLES DRAWN AEROBIC ONLY Blood Culture results may not be optimal due to an inadequate volume of blood received in culture bottles   Culture   Final    NO GROWTH 1 DAY Performed at [facility], [place] [address] [place], [place] [address]    Report Status PENDING  Incomplete  Culture, blood (Routine X 2) w Reflex to ID Panel     Status: None (Preliminary result)   Collection Time: 10/31/18 11:04 AM  Result Value Ref Range Status   Specimen Description BLOOD RIGHT WRIST  Final   Special Requests   Final    BOTTLES DRAWN AEROBIC ONLY Blood Culture results may not be optimal due to an inadequate volume of blood received in  culture bottles   Culture   Final    NO GROWTH 1 DAY Performed at [facility], [place] [address], [place], [place] [address]    Report Status PENDING  Incomplete    Radiology Reports Ct Head Wo Contrast  Result Date: 10/31/2018 CLINICAL DATA:  Head trauma. EXAM: CT HEAD WITHOUT CONTRAST CT CERVICAL SPINE WITHOUT CONTRAST TECHNIQUE: Multidetector CT imaging of the head and cervical spine was performed following the standard protocol  without intravenous contrast. Multiplanar CT image reconstructions of the cervical spine were also generated. COMPARISON:  MRI brain 07/15/2011. CT head 07/15/2011. CT head and cervical spine 07/08/2011 FINDINGS: CT HEAD FINDINGS Brain: Diffuse cerebral atrophy. Ventricular dilatation consistent with central atrophy. Low-attenuation changes in the deep white matter consistent with small vessel ischemia. No mass-effect or midline shift. No abnormal extra-axial fluid collections. Gray-white matter junctions are distinct. Basal cisterns are not effaced. No acute intracranial hemorrhage. Vascular: No hyperdense vessel or unexpected calcification. Skull: Normal. Negative for fracture or focal lesion. Sinuses/Orbits: Mucosal thickening in the paranasal sinuses. No acute air-fluid levels. Mastoid air cells are clear. Other: None. CT CERVICAL SPINE FINDINGS Alignment: Normal alignment of the cervical vertebrae and facet joints. C1-2 articulation appears intact. Skull base and vertebrae: Skull base is intact. No vertebral compression deformities. No focal bone lesion or bone destruction. Bone cortex appears intact. Soft tissues and spinal canal: No paraspinal soft tissue mass or infiltration. No prevertebral soft tissue swelling. Disc levels:  Intervertebral disc space heights are preserved. Upper chest: Several nodular opacities in the right lung apex are new since previous study and may represent nodular areas of infiltration. Largest measures about 6 x 10 mm. Recommend  three-month follow-up to exclude pulmonary nodule. Surgical clips in the base of the neck. Other: None. IMPRESSION: 1. No acute intracranial abnormalities. Chronic atrophy and small vessel ischemic changes. 2. Normal alignment of the cervical spine. No acute displaced fractures identified. 3. Several nodular opacities in the right lung apex are new since previous study and may represent nodular areas of infiltration. Recommend three-month follow-up to exclude pulmonary nodule. Electronically Signed   By: Lucienne Capers M.D.   On: 10/31/2018 02:11   Ct Cervical Spine Wo Contrast  Result Date: 10/31/2018 CLINICAL DATA:  Head trauma. EXAM: CT HEAD WITHOUT CONTRAST CT CERVICAL SPINE WITHOUT CONTRAST TECHNIQUE: Multidetector CT imaging of the head and cervical spine was performed following the standard protocol without intravenous contrast. Multiplanar CT image reconstructions of the cervical spine were also generated. COMPARISON:  MRI brain 07/15/2011. CT head 07/15/2011. CT head and cervical spine 07/08/2011 FINDINGS: CT HEAD FINDINGS Brain: Diffuse cerebral atrophy. Ventricular dilatation consistent with central atrophy. Low-attenuation changes in the deep white matter consistent with small vessel ischemia. No mass-effect or midline shift. No abnormal extra-axial fluid collections. Gray-white matter junctions are distinct. Basal cisterns are not effaced. No acute intracranial hemorrhage. Vascular: No hyperdense vessel or unexpected calcification. Skull: Normal. Negative for fracture or focal lesion. Sinuses/Orbits: Mucosal thickening in the paranasal sinuses. No acute air-fluid levels. Mastoid air cells are clear. Other: None. CT CERVICAL SPINE FINDINGS Alignment: Normal alignment of the cervical vertebrae and facet joints. C1-2 articulation appears intact. Skull base and vertebrae: Skull base is intact. No vertebral compression deformities. No focal bone lesion or bone destruction. Bone cortex appears intact.  Soft tissues and spinal canal: No paraspinal soft tissue mass or infiltration. No prevertebral soft tissue swelling. Disc levels:  Intervertebral disc space heights are preserved. Upper chest: Several nodular opacities in the right lung apex are new since previous study and may represent nodular areas of infiltration. Largest measures about 6 x 10 mm. Recommend three-month follow-up to exclude pulmonary nodule. Surgical clips in the base of the neck. Other: None. IMPRESSION: 1. No acute intracranial abnormalities. Chronic atrophy and small vessel ischemic changes. 2. Normal alignment of the cervical spine. No acute displaced fractures identified. 3. Several nodular opacities in the right lung apex are new since previous study and may  represent nodular areas of infiltration. Recommend three-month follow-up to exclude pulmonary nodule. Electronically Signed   By: Lucienne Capers M.D.   On: 10/31/2018 02:11   Dg Chest Port 1 View  Result Date: 10/31/2018 CLINICAL DATA:  Fall. EXAM: PORTABLE CHEST 1 VIEW COMPARISON:  CT chest 09/03/2017 FINDINGS: Shallow inspiration with linear atelectasis or fibrosis in the lung bases. No consolidation or airspace disease. No blunting of costophrenic angles. No pneumothorax. Mediastinal contours appear intact. Normal heart size and pulmonary vascularity. IMPRESSION: Shallow inspiration with linear atelectasis or fibrosis in the lung bases. Electronically Signed   By: Lucienne Capers M.D.   On: 10/31/2018 01:42    Lab Data:  CBC: Recent Labs  Lab 10/31/18 0137 10/31/18 1104  WBC 7.9 5.4  NEUTROABS 6.1  --   HGB 13.9 13.4  HCT 44.7 43.9  MCV 96.1 93.8  PLT 221 161*   Basic Metabolic Panel: Recent Labs  Lab 10/31/18 0137 10/31/18 1104  NA 132* 137  K 3.7 3.5  CL 98 102  CO2 26 26  GLUCOSE 96 79  BUN 8 8  CREATININE 1.54* 1.18  CALCIUM 9.5 9.2   GFR: Estimated Creatinine Clearance: 68.9 mL/min (by C-G formula based on SCr of 1.18 mg/dL). Liver  Function Tests: Recent Labs  Lab 10/31/18 0137  AST 26  ALT 17  ALKPHOS 99  BILITOT 0.9  PROT 6.8  ALBUMIN 3.3*   No results for input(s): LIPASE, AMYLASE in the last 168 hours. Recent Labs  Lab 10/31/18 1104  AMMONIA 35   Coagulation Profile: Recent Labs  Lab 10/31/18 0137  INR 1.25   Cardiac Enzymes: No results for input(s): CKTOTAL, CKMB, CKMBINDEX, TROPONINI in the last 168 hours. BNP (last 3 results) No results for input(s): PROBNP in the last 8760 hours. HbA1C: No results for input(s): HGBA1C in the last 72 hours. CBG: No results for input(s): GLUCAP in the last 168 hours. Lipid Profile: No results for input(s): CHOL, HDL, LDLCALC, TRIG, CHOLHDL, LDLDIRECT in the last 72 hours. Thyroid Function Tests: Recent Labs    10/31/18 1104  TSH 2.335   Anemia Panel: No results for input(s): VITAMINB12, FOLATE, FERRITIN, TIBC, IRON, RETICCTPCT in the last 72 hours. Urine analysis:    Component Value Date/Time   COLORURINE YELLOW 10/31/2018 0224   APPEARANCEUR CLEAR 10/31/2018 0224   LABSPEC 1.018 10/31/2018 0224   PHURINE 5.0 10/31/2018 0224   GLUCOSEU NEGATIVE 10/31/2018 0224   HGBUR MODERATE (A) 10/31/2018 0224   BILIRUBINUR NEGATIVE 10/31/2018 0224   KETONESUR NEGATIVE 10/31/2018 0224   PROTEINUR NEGATIVE 10/31/2018 0224   UROBILINOGEN 0.2 11/22/2007 2045   NITRITE NEGATIVE 10/31/2018 0224   LEUKOCYTESUR TRACE (A) 10/31/2018 0224       M.D. [facility] 11/02/2018, 10:45 AM  Pager: [phone] Between 7am to 7pm - call Pager - [phone]  After 7pm go to [email or web address] - password [email or web address]  Call night coverage person covering after 7pm

## 2018-11-02 NOTE — Progress Notes (Signed)
IP rehab admissions - I met with patient and his wife at the bedside.  They would like CIR.  I have contacted [employer] and am requesting acute inpatient rehab admission.  I will update all after I hear back from insurance case manager.  Call me for questions.  [phone]

## 2018-11-02 NOTE — H&P (Signed)
Physical Medicine and Rehabilitation Admission H&P        Chief Complaint  Patient presents with  . Encephalopathy. History of Myotonic dystrophy      HPI: [name]. [name] is a 57 year old male with history of myotonic dystrophy with progressive weakness, dysphagia and expressive deficits, chronic respiratory failure, ADHD, fatty liver; who was admitted from home with generalized weakness, multiple falls with difficulty getting off the floor as well as confusion.  UDS was negative.  He was noted to be dehydrated and treated with fluid boluses with improvement in mentation.  Work-up negative for infection or acute changes and encephalopathy felt to be due to AKI due to dehydration.   Therapy evaluations done revealing cognitive deficits with poor safety awareness as well as deficits in mobility and ability to carry out ADLs.  CIR recommended for follow-up therapy     Review of Systems  Constitutional: Positive for malaise/fatigue (tends to tire out easily). Negative for chills and fever.  HENT: Negative for hearing loss and tinnitus.   Eyes: Negative for blurred vision and double vision.  Respiratory: Positive for shortness of breath (chronic). Negative for cough.   Cardiovascular: Negative for chest pain and palpitations.  Gastrointestinal: Negative for constipation, heartburn and nausea.  Genitourinary: Negative for dysuria and urgency.       Question hesitancy  Musculoskeletal: Positive for falls. Negative for myalgias.  Skin: Negative for itching and rash.  Neurological: Negative for dizziness and headaches.  Psychiatric/Behavioral: Negative for depression. The patient has insomnia. The patient is not nervous/anxious.             Past Medical History:  Diagnosis Date  . Adult ADHD    . Aspiration pneumonitis ([place]) 04/2017, 08/2017    Recurrent  . Chronic respiratory failure ([place])      As of 04/2018, Dr. Chase Caller suspects hypercapnic RF--but [name] normal.  May eventually  need nocturnal noninvasive ventilation.  Ultimately, if progresses enough pt will need trach.  . Constipation, chronic      Has caused irritative urinary/bladder symptoms in the past.  . Depression    . Dizziness 06/2011+    Noncontrast CT and noncontrast MRI both negative 06/2011 at [facility] ED visit.  . Elevated transaminase level      Liver biopsy revealed fatty liver  . Erectile dysfunction    . Facial fracture ([place]) 06/2011    Nondisplaced bilateral [place] I fracture: no surgery required. (ENT, Dr. Wilburn Cornelia)   . Fatty liver disease, nonalcoholic    . GERD (gastroesophageal reflux disease)    . History of thyroid cancer 2010    Medullary carcinoma of thyroid. Thyroidectomy 08/2009 (Dr. Aida Puffer at [facility]).  Needs periodic serum calcitonin monitoring.  Marland Kitchen Hyperlipidemia    . Hypothyroidism    . LVH (left ventricular hypertrophy)      by EKG criteria 06/2011.  Hx of heart murmur--Followed by cardiology at [facility]: EF's have been "stable" per cardio office note 12/29/10.  His echo 12/2012 showed normal LV size and wall thickness but EF 45-50% and global LV hypokinesis was found.  . Myotonic dystrophy ([place]) Dx'd 1996    Progressive muscle weakness and swallowing dysfunction.  Initial dx by Dr. Jannifer Franklin, neurologist, but subsequent annual specialist f/u has been with Dr. Ladon Applebaum [facility].  Progressive debilitation--PT referral 04/2017, pt quit PT 05/2017.  . Nephrolithiasis    . Obesity, Class I, BMI 30-34.9    . OSA (obstructive sleep apnea)  does not wear CPAP  . Pulmonary nodule, right 05/2017    RUL, 7 mm sub solid nodule.  Resolution noted on f/u CT 08/2017.           Past Surgical History:  Procedure Laterality Date  . COLONOSCOPY   09/13/2007    normal (Dr. Deatra Ina).  . EXTRACORPOREAL SHOCK WAVE LITHOTRIPSY      . THYROIDECTOMY   08/2009    for medullary thyroid cancer (Dr. Conley Canal at [facility] follows him for this).  . TRANSTHORACIC ECHOCARDIOGRAM   12/2012; 09/2017    2014-Normal LV  size and wall thickness, LV EF 45-50%, with global hypokinesis of LV.  2018- EF 50-55%, normal wall motion, grd I DD, valves normal.           Family History  Problem Relation Age of Onset  . Arthritis Mother    . Hypertension Mother    . Alcohol abuse Father    . Arthritis Father    . Hypertension Father    . Colon cancer Father    . Prostate cancer Father        Social History: Married.  Sedentary but independent prior to admission. He reports that he has never smoked. He has never used smokeless tobacco. He reports that he does not drinks alcohol. He reports that he does not use drugs.    Allergies: No Known Allergies            Medications Prior to Admission  Medication Sig Dispense Refill  . amphetamine-dextroamphetamine (ADDERALL) 20 MG tablet 1 tab po tid (Patient taking differently: Take 20 mg by mouth 2 (two) times daily. ) 90 tablet 0  . FLUoxetine (PROZAC) 20 MG tablet TAKE 1 TABLET BY MOUTH ONCE DAILY (Patient taking differently: Take 20 mg by mouth daily. ) 30 tablet 3  . levothyroxine (SYNTHROID, LEVOTHROID) 150 MCG tablet TAKE 1 TABLET BY MOUTH ONCE DAILY EXCEPT FOR WEDNESDAY  SKIP DOSE (Patient taking differently: Take 150 mcg by mouth See admin instructions. Take 1 tablet every day except on Sunday) 30 tablet 1  . omeprazole (PRILOSEC) 40 MG capsule Take 1 capsule (40 mg total) by mouth daily. 90 capsule 3  . traZODone (DESYREL) 100 MG tablet TAKE 1 TABLET BY MOUTH AT BEDTIME AS NEEDED FOR SLEEP. (Patient taking differently: Take 100 mg by mouth at bedtime as needed for sleep. ) 30 tablet 11  . [DISCONTINUED] acetaminophen (TYLENOL) 500 MG tablet Take 1,000 mg by mouth every 6 (six) hours as needed (pain).       . [DISCONTINUED] levothyroxine (SYNTHROID, LEVOTHROID) 150 MCG tablet TAKE 1 TABLET BY MOUTH ONCE DAILY EXCEPT FOR WEDNESDAY AND FRIDAYS SKIP DOSE (Patient not taking: Reported on 10/31/2018) 30 tablet 0      Drug Regimen Review  Drug regimen was reviewed and  remains appropriate with no significant issues identified      Home: Home Living Family/patient expects to be discharged to:: Private residence Living Arrangements: Spouse/significant other Available Help at Discharge: Family, Available 24 hours/day Type of Home: House Home Access: Stairs to enter [employer] of Steps: 5 Entrance Stairs-Rails: Right, Left Home Layout: Two level, Bed/bath upstairs Alternate Level Stairs-Number of Steps: flight Alternate Level Stairs-Rails: Right, Left Bathroom Shower/Tub: Tub/shower unit Home Equipment: Other (comment), Shower seat(has access to a walker but unsure if it is appropriate)   Functional History: Prior Function Level of Independence: Independent Comments: pt reports was ambulating without AD and completing ADLs without assist   Functional Status:  Mobility:  Bed Mobility Overal bed mobility: Needs Assistance Bed Mobility: Supine to Sit Supine to sit: Min guard General bed mobility comments: increased time and effort, min guard for safety Transfers Overall transfer level: Needs assistance Equipment used: Rolling walker (2 wheeled) Transfers: Sit to/from Stand Sit to Stand: Min assist General transfer comment: increased time and effort, multimodal cueing for safety and technique, assist to power into standing from EOB Ambulation/Gait Ambulation/Gait assistance: Min assist Gait Distance (Feet): 10 Feet Assistive device: Rolling walker (2 wheeled) Gait Pattern/deviations: Step-to pattern, Step-through pattern, Decreased step length - right, Decreased step length - left, Decreased stride length, Shuffle General Gait Details: pt with increased fatigue and decreased tolerance for ambulation, very limited with distance. Pt also with reported pain in L LE and requesting to sit down Gait velocity: decreased   ADL: ADL Overall ADL's : Needs assistance/impaired Eating/Feeding: Set up, Sitting Grooming: Set up, Min guard,  Sitting Upper Body Bathing: Minimal assistance, Sitting Lower Body Bathing: Moderate assistance, +2 for physical assistance, +2 for safety/equipment, Sit to/from stand Upper Body Dressing : Minimal assistance, Sitting Lower Body Dressing: Moderate assistance, +2 for physical assistance, +2 for safety/equipment, Sit to/from stand Lower Body Dressing Details (indicate cue type and reason): pt able to don L sock, able to perform figure 4 technique with RLE though unable to maintain while donning sock, requires assist to complete Toilet Transfer: Minimal assistance, +2 for physical assistance, +2 for safety/equipment, RW, BSC Toilet Transfer Details (indicate cue type and reason): BSC over toilet; due to limited space in bathroom had to transition from RW with +2 assist to HHA+2 Toileting- Clothing Manipulation and Hygiene: Minimal assistance, Sit to/from stand, +2 for physical assistance, +2 for safety/equipment Toileting - Clothing Manipulation Details (indicate cue type and reason): assist for gown/brief management Functional mobility during ADLs: Minimal assistance, +2 for physical assistance, +2 for safety/equipment, Rolling walker   Cognition: Cognition Overall Cognitive Status: Impaired/Different from baseline Orientation Level: Oriented to place, Oriented to situation, Oriented to person Cognition Arousal/Alertness: Awake/alert Behavior During Therapy: Flat affect Overall Cognitive Status: Impaired/Different from baseline Area of Impairment: Attention, Memory, Following commands, Safety/judgement, Problem solving Current Attention Level: Selective Memory: Decreased short-term memory Following Commands: Follows one step commands with increased time, Follows one step commands inconsistently Safety/Judgement: Decreased awareness of safety Awareness: Emergent Problem Solving: Difficulty sequencing, Requires verbal cues General Comments: pt impulsive during mobility and completion of  functional tasks requiring increased cues for safety; requires increased time and multimodal cues to sequence/follow one step commands. Pt with difficulty completing dual task, when pt responding to questions while walking noted pt often having to stop and stand still prior to responding     Blood pressure 132/86, pulse 81, temperature (!) 97.5 F (36.4 C), temperature source Oral, resp. rate 18, height 5\' 5"  (1.651 m), weight 86.2 kg, SpO2 97 %. Physical Exam  Nursing note and vitals reviewed. Constitutional: He is oriented to person, place, and time. He appears well-developed and well-nourished. No distress.  Eyes: Pupils are equal, round, and reactive to light. EOM are normal.  Neck: No tracheal deviation present. No thyromegaly present.  Cardiovascular: Normal rate. Exam reveals no friction rub.  No murmur heard. Respiratory: No respiratory distress. He has no wheezes.  Neurological: He is alert and oriented to person, place, and time.  Alert. Follows commands. Voice is stronger today with decrease in slurring. Facial diplegia. No myotonia. Strength 4/5 all 4's proximal to distal. Sensation intact.   Skin: He is not diaphoretic.  Psychiatric:  flat      Lab Results Last 48 Hours  No results found for this or any previous visit (from the past 48 hour(s)).   Imaging Results (Last 48 hours)  No results found.           Medical Problem List and Plan: 1.  Debility and encephalopathy  secondary to AKI/dehydration in a 57 yo male with myotonic dystrophy 2.  DVT Prophylaxis/Anticoagulation: Pharmaceutical: Lovenox 3. Pain Management: Tylenol as needed  4. Mood: LCSW to follow for evaluation and support 5. Neuropsych: This patient is not fully capable of making decisions on his own behalf. 6. Skin/Wound Care: Routine pressure relief measures 7. Fluids/Electrolytes/Nutrition: Monitor I's and O's.  Check labs in a.m. 8.  Myotonic dystrophy: With reports of progression.  Respiratory  status stable 9.  ADHD: Continue Adderall 3 times daily 10.  Thrombocytopenia: Check follow up CBC in am.        Post Admission Physician Evaluation: 1. Functional deficits secondary  to debility/encephalopathy. 2. Patient is admitted to receive collaborative, interdisciplinary care between the physiatrist, rehab nursing staff, and therapy team. 3. Patient's level of medical complexity and substantial therapy needs in context of that medical necessity cannot be provided at a lesser intensity of care such as a SNF. 4. Patient has experienced substantial functional loss from his/her baseline which was documented above under the "Functional History" and "Functional Status" headings.  Judging by the patient's diagnosis, physical exam, and functional history, the patient has potential for functional progress which will result in measurable gains while on inpatient rehab.  These gains will be of substantial and practical use upon discharge  in facilitating mobility and self-care at the household level. 5. Physiatrist will provide 24 hour management of medical needs as well as oversight of the therapy plan/treatment and provide guidance as appropriate regarding the interaction of the two. 6. The Preadmission Screening has been reviewed and patient status is unchanged unless otherwise stated above. 7. 24 hour rehab nursing will assist with bladder management, bowel management, safety, skin/wound care, disease management, medication administration, pain management and patient education  and help integrate therapy concepts, techniques,education, etc. 8. PT will assess and treat for/with: Lower extremity strength, range of motion, stamina, balance, functional mobility, safety, adaptive techniques and equipment, NMR, family ed.   Goals are: mod I to supervision goals. 9. OT will assess and treat for/with: ADL's, functional mobility, safety, upper extremity strength, adaptive techniques and equipment, NMR, family  ed.   Goals are: mod I to supervision. Therapy may proceed with showering this patient. 10. SLP will assess and treat for/with: cognition, communication, swallowing, family ed.  Goals are: mod I to supervision. 11. Case Management and Social Worker will assess and treat for psychological issues and discharge planning. 12. Team conference will be held weekly to assess progress toward goals and to determine barriers to discharge. 13. Patient will receive at least 3 hours of therapy per day at least 5 days per week. 14. ELOS: 8-12 days       15. Prognosis:  excellent     I have personally performed a face to face diagnostic evaluation of this patient and formulated the key components of the plan.  Additionally, I have personally reviewed laboratory data, imaging studies, as well as relevant notes and concur with the physician assistant's documentation above.   Meredith Staggers, MD, Mellody Drown     Bary Leriche, PA-C 11/02/2018  The patient's status has not changed. The original post admission physician  evaluation remains appropriate, and any changes from the pre-admission screening or documentation from the acute chart are noted above.  Meredith Staggers, MD 11/02/2018

## 2018-11-02 NOTE — H&P (Signed)
Physical Medicine and Rehabilitation Admission H&P    Chief Complaint  Patient presents with  . Encephalopathy. History of Myotonic dystrophy    HPI: [name]. [name] is a 57 year old male with history of myotonic dystrophy with progressive weakness, dysphagia and expressive deficits, chronic respiratory failure, ADHD, fatty liver; who was admitted from home with generalized weakness, multiple falls with difficulty getting off the floor as well as confusion.  UDS was negative.  He was noted to be dehydrated and treated with fluid boluses with improvement in mentation.  Work-up negative for infection or acute changes and encephalopathy felt to be due to AKI due to dehydration.   Therapy evaluations done revealing cognitive deficits with poor safety awareness as well as deficits in mobility and ability to carry out ADLs.  CIR recommended for follow-up therapy   Review of Systems  Constitutional: Positive for malaise/fatigue (tends to tire out easily). Negative for chills and fever.  HENT: Negative for hearing loss and tinnitus.   Eyes: Negative for blurred vision and double vision.  Respiratory: Positive for shortness of breath (chronic). Negative for cough.   Cardiovascular: Negative for chest pain and palpitations.  Gastrointestinal: Negative for constipation, heartburn and nausea.  Genitourinary: Negative for dysuria and urgency.       Question hesitancy  Musculoskeletal: Positive for falls. Negative for myalgias.  Skin: Negative for itching and rash.  Neurological: Negative for dizziness and headaches.  Psychiatric/Behavioral: Negative for depression. The patient has insomnia. The patient is not nervous/anxious.       Past Medical History:  Diagnosis Date  . Adult ADHD   . Aspiration pneumonitis ([place]) 04/2017, 08/2017   Recurrent  . Chronic respiratory failure ([place])    As of 04/2018, Dr. Chase Caller suspects hypercapnic RF--but [name] normal.  May eventually need nocturnal  noninvasive ventilation.  Ultimately, if progresses enough pt will need trach.  . Constipation, chronic    Has caused irritative urinary/bladder symptoms in the past.  . Depression   . Dizziness 06/2011+   Noncontrast CT and noncontrast MRI both negative 06/2011 at [facility] ED visit.  . Elevated transaminase level    Liver biopsy revealed fatty liver  . Erectile dysfunction   . Facial fracture ([place]) 06/2011   Nondisplaced bilateral [place] I fracture: no surgery required. (ENT, Dr. Wilburn Cornelia)   . Fatty liver disease, nonalcoholic   . GERD (gastroesophageal reflux disease)   . History of thyroid cancer 2010   Medullary carcinoma of thyroid. Thyroidectomy 08/2009 (Dr. Aida Puffer at [facility]).  Needs periodic serum calcitonin monitoring.  Marland Kitchen Hyperlipidemia   . Hypothyroidism   . LVH (left ventricular hypertrophy)    by EKG criteria 06/2011.  Hx of heart murmur--Followed by cardiology at [facility]: EF's have been "stable" per cardio office note 12/29/10.  His echo 12/2012 showed normal LV size and wall thickness but EF 45-50% and global LV hypokinesis was found.  . Myotonic dystrophy ([place]) Dx'd 1996   Progressive muscle weakness and swallowing dysfunction.  Initial dx by Dr. Jannifer Franklin, neurologist, but subsequent annual specialist f/u has been with Dr. Ladon Applebaum [facility].  Progressive debilitation--PT referral 04/2017, pt quit PT 05/2017.  . Nephrolithiasis   . Obesity, Class I, BMI 30-34.9   . OSA (obstructive sleep apnea)    does not wear CPAP  . Pulmonary nodule, right 05/2017   RUL, 7 mm sub solid nodule.  Resolution noted on f/u CT 08/2017.    Past Surgical History:  Procedure Laterality Date  . COLONOSCOPY  09/13/2007  normal (Dr. Deatra Ina).  . EXTRACORPOREAL SHOCK WAVE LITHOTRIPSY    . THYROIDECTOMY  08/2009   for medullary thyroid cancer (Dr. Conley Canal at [facility] follows him for this).  . TRANSTHORACIC ECHOCARDIOGRAM  12/2012; 09/2017   2014-Normal LV size and wall thickness, LV EF 45-50%, with global  hypokinesis of LV.  2018- EF 50-55%, normal wall motion, grd I DD, valves normal.    Family History  Problem Relation Age of Onset  . Arthritis Mother   . Hypertension Mother   . Alcohol abuse Father   . Arthritis Father   . Hypertension Father   . Colon cancer Father   . Prostate cancer Father     Social History: Married.  Sedentary but independent prior to admission. He reports that he has never smoked. He has never used smokeless tobacco. He reports that he does not drinks alcohol. He reports that he does not use drugs.    Allergies: No Known Allergies    Medications Prior to Admission  Medication Sig Dispense Refill  . amphetamine-dextroamphetamine (ADDERALL) 20 MG tablet 1 tab po tid (Patient taking differently: Take 20 mg by mouth 2 (two) times daily. ) 90 tablet 0  . FLUoxetine (PROZAC) 20 MG tablet TAKE 1 TABLET BY MOUTH ONCE DAILY (Patient taking differently: Take 20 mg by mouth daily. ) 30 tablet 3  . levothyroxine (SYNTHROID, LEVOTHROID) 150 MCG tablet TAKE 1 TABLET BY MOUTH ONCE DAILY EXCEPT FOR WEDNESDAY  SKIP DOSE (Patient taking differently: Take 150 mcg by mouth See admin instructions. Take 1 tablet every day except on Sunday) 30 tablet 1  . omeprazole (PRILOSEC) 40 MG capsule Take 1 capsule (40 mg total) by mouth daily. 90 capsule 3  . traZODone (DESYREL) 100 MG tablet TAKE 1 TABLET BY MOUTH AT BEDTIME AS NEEDED FOR SLEEP. (Patient taking differently: Take 100 mg by mouth at bedtime as needed for sleep. ) 30 tablet 11  . [DISCONTINUED] acetaminophen (TYLENOL) 500 MG tablet Take 1,000 mg by mouth every 6 (six) hours as needed (pain).     . [DISCONTINUED] levothyroxine (SYNTHROID, LEVOTHROID) 150 MCG tablet TAKE 1 TABLET BY MOUTH ONCE DAILY EXCEPT FOR WEDNESDAY AND FRIDAYS SKIP DOSE (Patient not taking: Reported on 10/31/2018) 30 tablet 0    Drug Regimen Review  Drug regimen was reviewed and remains appropriate with no significant issues identified    Home: Home  Living Family/patient expects to be discharged to:: Private residence Living Arrangements: Spouse/significant other Available Help at Discharge: Family, Available 24 hours/day Type of Home: House Home Access: Stairs to enter Technical brewer of Steps: 5 Entrance Stairs-Rails: Right, Left Home Layout: Two level, Bed/bath upstairs Alternate Level Stairs-Number of Steps: flight Alternate Level Stairs-Rails: Right, Left Bathroom Shower/Tub: Tub/shower unit Home Equipment: Other (comment), Shower seat(has access to a walker but unsure if it is appropriate)   Functional History: Prior Function Level of Independence: Independent Comments: pt reports was ambulating without AD and completing ADLs without assist  Functional Status:  Mobility: Bed Mobility Overal bed mobility: Needs Assistance Bed Mobility: Supine to Sit Supine to sit: Min guard General bed mobility comments: increased time and effort, min guard for safety Transfers Overall transfer level: Needs assistance Equipment used: Rolling walker (2 wheeled) Transfers: Sit to/from Stand Sit to Stand: Min assist General transfer comment: increased time and effort, multimodal cueing for safety and technique, assist to power into standing from EOB Ambulation/Gait Ambulation/Gait assistance: Min assist Gait Distance (Feet): 10 Feet Assistive device: Rolling walker (2 wheeled) Gait Pattern/deviations: Step-to  pattern, Step-through pattern, Decreased step length - right, Decreased step length - left, Decreased stride length, Shuffle General Gait Details: pt with increased fatigue and decreased tolerance for ambulation, very limited with distance. Pt also with reported pain in L LE and requesting to sit down Gait velocity: decreased    ADL: ADL Overall ADL's : Needs assistance/impaired Eating/Feeding: Set up, Sitting Grooming: Set up, Min guard, Sitting Upper Body Bathing: Minimal assistance, Sitting Lower Body Bathing:  Moderate assistance, +2 for physical assistance, +2 for safety/equipment, Sit to/from stand Upper Body Dressing : Minimal assistance, Sitting Lower Body Dressing: Moderate assistance, +2 for physical assistance, +2 for safety/equipment, Sit to/from stand Lower Body Dressing Details (indicate cue type and reason): pt able to don L sock, able to perform figure 4 technique with RLE though unable to maintain while donning sock, requires assist to complete Toilet Transfer: Minimal assistance, +2 for physical assistance, +2 for safety/equipment, RW, BSC Toilet Transfer Details (indicate cue type and reason): BSC over toilet; due to limited space in bathroom had to transition from RW with +2 assist to HHA+2 Toileting- Clothing Manipulation and Hygiene: Minimal assistance, Sit to/from stand, +2 for physical assistance, +2 for safety/equipment Toileting - Clothing Manipulation Details (indicate cue type and reason): assist for gown/brief management Functional mobility during ADLs: Minimal assistance, +2 for physical assistance, +2 for safety/equipment, Rolling walker  Cognition: Cognition Overall Cognitive Status: Impaired/Different from baseline Orientation Level: Oriented to place, Oriented to situation, Oriented to person Cognition Arousal/Alertness: Awake/alert Behavior During Therapy: Flat affect Overall Cognitive Status: Impaired/Different from baseline Area of Impairment: Attention, Memory, Following commands, Safety/judgement, Problem solving Current Attention Level: Selective Memory: Decreased short-term memory Following Commands: Follows one step commands with increased time, Follows one step commands inconsistently Safety/Judgement: Decreased awareness of safety Awareness: Emergent Problem Solving: Difficulty sequencing, Requires verbal cues General Comments: pt impulsive during mobility and completion of functional tasks requiring increased cues for safety; requires increased time and  multimodal cues to sequence/follow one step commands. Pt with difficulty completing dual task, when pt responding to questions while walking noted pt often having to stop and stand still prior to responding   Blood pressure 132/86, pulse 81, temperature (!) 97.5 F (36.4 C), temperature source Oral, resp. rate 18, height 5\' 5"  (1.651 m), weight 86.2 kg, SpO2 97 %. Physical Exam  Nursing note and vitals reviewed. Constitutional: He is oriented to person, place, and time. He appears well-developed and well-nourished. No distress.  Eyes: Pupils are equal, round, and reactive to light. EOM are normal.  Neck: No tracheal deviation present. No thyromegaly present.  Cardiovascular: Normal rate. Exam reveals no friction rub.  No murmur heard. Respiratory: No respiratory distress. He has no wheezes.  Neurological: He is alert and oriented to person, place, and time.  Alert. Follows commands. Voice is stronger today with decrease in slurring. Facial diplegia. No myotonia. Strength 4/5 all 4's proximal to distal. Sensation intact.   Skin: He is not diaphoretic.  Psychiatric:  flat    No results found for this or any previous visit (from the past 48 hour(s)). No results found.     Medical Problem List and Plan: 1.  Debility and encephalopathy  secondary to AKI/dehydration in a 57 yo male with myotonic dystrophy 2.  DVT Prophylaxis/Anticoagulation: Pharmaceutical: Lovenox 3. Pain Management: Tylenol as needed  4. Mood: LCSW to follow for evaluation and support 5. Neuropsych: This patient is not fully capable of making decisions on his own behalf. 6. Skin/Wound Care: Routine pressure relief  measures 7. Fluids/Electrolytes/Nutrition: Monitor I's and O's.  Check labs in a.m. 8.  Myotonic dystrophy: With reports of progression.  Respiratory status stable 9.  ADHD: Continue Adderall 3 times daily 10.  Thrombocytopenia: Check follow up CBC in am.     Post Admission Physician  Evaluation: 1. Functional deficits secondary  to debility/encephalopathy. 2. Patient is admitted to receive collaborative, interdisciplinary care between the physiatrist, rehab nursing staff, and therapy team. 3. Patient's level of medical complexity and substantial therapy needs in context of that medical necessity cannot be provided at a lesser intensity of care such as a SNF. 4. Patient has experienced substantial functional loss from his/her baseline which was documented above under the "Functional History" and "Functional Status" headings.  Judging by the patient's diagnosis, physical exam, and functional history, the patient has potential for functional progress which will result in measurable gains while on inpatient rehab.  These gains will be of substantial and practical use upon discharge  in facilitating mobility and self-care at the household level. 5. Physiatrist will provide 24 hour management of medical needs as well as oversight of the therapy plan/treatment and provide guidance as appropriate regarding the interaction of the two. 6. The Preadmission Screening has been reviewed and patient status is unchanged unless otherwise stated above. 7. 24 hour rehab nursing will assist with bladder management, bowel management, safety, skin/wound care, disease management, medication administration, pain management and patient education  and help integrate therapy concepts, techniques,education, etc. 8. PT will assess and treat for/with: Lower extremity strength, range of motion, stamina, balance, functional mobility, safety, adaptive techniques and equipment, NMR, family ed.   Goals are: mod I to supervision goals. 9. OT will assess and treat for/with: ADL's, functional mobility, safety, upper extremity strength, adaptive techniques and equipment, NMR, family ed.   Goals are: mod I to supervision. Therapy may proceed with showering this patient. 10. SLP will assess and treat for/with: cognition,  communication, swallowing, family ed.  Goals are: mod I to supervision. 11. Case Management and Social Worker will assess and treat for psychological issues and discharge planning. 12. Team conference will be held weekly to assess progress toward goals and to determine barriers to discharge. 13. Patient will receive at least 3 hours of therapy per day at least 5 days per week. 14. ELOS: 8-12 days       15. Prognosis:  excellent   I have personally performed a face to face diagnostic evaluation of this patient and formulated the key components of the plan.  Additionally, I have personally reviewed laboratory data, imaging studies, as well as relevant notes and concur with the physician assistant's documentation above.  Meredith Staggers, MD, Mellody Drown    Bary Leriche, PA-C 11/02/2018

## 2018-11-02 NOTE — Progress Notes (Signed)
Physical Therapy Treatment Patient Details Name: [name] MRN: [id number] DOB: 04/19/1961 Today's Date: 11/02/2018    History of Present Illness Pt is a 57 y.o. male with medical history significant of myotonic dystrophy, GERD, secondary hypothyroidism after thyroidectomy because of thyroid cancer, depression, ADHD, OSA not on CPAP, fatty liver, hyperlipidemia, pulmonary nodules, who presents with generalized weakness, falls and confusion. CT of head and C-spine negative for acute issues, but showed several pulmonary nodules in the right apex area.    PT Comments    Patient's tolerance to treatment today was good.  Patient was standing in front of bedside commode with nursing and wife present performing pericare upon PT arrival.  Patient was eager and willing to participate in therapy today.  Treatment progressed with patient performing sit to stand from bedside commode to RW and initiating gait with chair follow.  Patient required min A and verbal cueing for pacing, maintaining path and close distance to RW to ensure safety throughout treatment.  Patient would continue to benefit from acute care PT in order to address balance and mobility deficits prior to recommended CIR destination based on current functional status.  Follow Up Recommendations  CIR     Equipment Recommendations  Rolling walker with 5" wheels    Recommendations for Other Services Rehab consult     Precautions / Restrictions Precautions Precautions: Fall Restrictions Weight Bearing Restrictions: No    Mobility  Bed Mobility                  Transfers Overall transfer level: Needs assistance Equipment used: Rolling walker (2 wheeled) Transfers: Sit to/from Stand Sit to Stand: Min assist         General transfer comment: pt continues to require min A to boost into standing from bedside commode.  He required verbal cueing for scooting to edge, hand and feet placement, and leaning forward prior  to standing.  Ambulation/Gait Ambulation/Gait assistance: Min assist Gait Distance (Feet): 80 Feet Assistive device: Rolling walker (2 wheeled) Gait Pattern/deviations: Step-through pattern;Decreased step length - right;Decreased step length - left;Decreased stride length;Shuffle;Drifts right/left;Trunk flexed Gait velocity: decreased   General Gait Details: pt required min A and VC to remain within RW and to maintain path of RW during ambulation.  Patient also required VC for pacing to ensure safety.  Patient demonstrated slight trunk forward flexion but was able to correct after prompted by therapist.   Stairs             Wheelchair Mobility    Modified Rankin (Stroke Patients Only) Modified Rankin (Stroke Patients Only) Pre-Morbid Rankin Score: No symptoms Modified Rankin: Moderately severe disability     Balance Overall balance assessment: Needs assistance Sitting-balance support: Feet supported Sitting balance-Leahy Scale: Good     Standing balance support: During functional activity;Bilateral upper extremity supported Standing balance-Leahy Scale: Poor                              Cognition Arousal/Alertness: Awake/alert Behavior During Therapy: WFL for tasks assessed/performed Overall Cognitive Status: Impaired/Different from baseline Area of Impairment: Orientation;Safety/judgement;Following commands;Problem solving                 Orientation Level: Disoriented to;Time(slightly disoriented to date) Current Attention Level: Selective Memory: Decreased short-term memory Following Commands: Follows one step commands with increased time;Follows one step commands inconsistently Safety/Judgement: Decreased awareness of safety Awareness: Emergent Problem Solving: Slow processing;Difficulty sequencing;Requires verbal cues  Exercises General Exercises - Lower Extremity Long Arc Quad: AROM;Both;10 reps;Seated Hip Flexion/Marching:  AROM;Both;10 reps;Seated    General Comments        Pertinent Vitals/Pain Pain Assessment: No/denies pain    Home Living                      Prior Function            PT Goals (current goals can now be found in the care plan section) Acute Rehab PT Goals Patient Stated Goal: none stated PT Goal Formulation: With patient Time For Goal Achievement: 11/14/18 Potential to Achieve Goals: Fair Progress towards PT goals: Progressing toward goals    Frequency    Min 3X/week      PT Plan Current plan remains appropriate    Co-evaluation              AM-PAC PT "6 Clicks" Daily Activity  Outcome Measure  Difficulty turning over in bed (including adjusting bedclothes, sheets and blankets)?: A Lot Difficulty moving from lying on back to sitting on the side of the bed? : A Lot Difficulty sitting down on and standing up from a chair with arms (e.g., wheelchair, bedside commode, etc,.)?: A Little Help needed moving to and from a bed to chair (including a wheelchair)?: A Little Help needed walking in hospital room?: A Little Help needed climbing 3-5 steps with a railing? : A Lot 6 Click Score: 15    End of Session Equipment Utilized During Treatment: Gait belt Activity Tolerance: Patient tolerated treatment well Patient left: in chair;with call bell/phone within reach;with family/visitor present Nurse Communication: Mobility status PT Visit Diagnosis: Other abnormalities of gait and mobility (R26.89);Muscle weakness (generalized) (M62.81)     Time: 2947-6546 PT Time Calculation (min) (ACUTE ONLY): 20 min  Charges:  $Gait Training: 8-22 mins                     [address] SPTA   Quantina Dershem 11/02/2018, 4:52 PM

## 2018-11-02 NOTE — Discharge Summary (Signed)
Physician Discharge Summary   Patient ID: [name] MRN: [id number] DOB/AGE: 1961-05-31 57 y.o.  Admit date: 10/30/2018 Discharge date: 11/02/2018  Primary Care Physician:  Robert Sou, MD   Recommendations for Outpatient Follow-up:  1. Follow up with PCP in 1-2 weeks 2. Recommended CT chest in 3 months for the follow-up of possible pulmonary nodule   Home Health: Patient accepted into inpatient rehab Equipment/Devices:   Discharge Condition: stable  CODE STATUS: FULL  Diet recommendation: Heart healthy diet   Discharge Diagnoses:    . Acute metabolic encephalopathy . Pulmonary nodule . AKI (acute kidney injury) ([place]) . Fall . ADD (attention deficit disorder) . Depression . GERD . Hypothyroidism . Myotonic muscular dystrophy ([place])    Consults:  Inpatient rehab CIR    Allergies:  No Known Allergies   DISCHARGE MEDICATIONS: Allergies as of 11/02/2018   No Known Allergies     Medication List    TAKE these medications   acetaminophen 500 MG tablet Commonly known as:  TYLENOL Take 2 tablets (1,000 mg total) by mouth every 6 (six) hours as needed (pain).   amphetamine-dextroamphetamine 20 MG tablet Commonly known as:  ADDERALL 1 tab po tid What changed:    how much to take  how to take this  when to take this  additional instructions   FLUoxetine 20 MG tablet Commonly known as:  PROZAC TAKE 1 TABLET BY MOUTH ONCE DAILY   levothyroxine 150 MCG tablet Commonly known as:  SYNTHROID, LEVOTHROID TAKE 1 TABLET BY MOUTH ONCE DAILY EXCEPT FOR WEDNESDAY  SKIP DOSE What changed:    how much to take  how to take this  when to take this  additional instructions  Another medication with the same name was removed. Continue taking this medication, and follow the directions you see here.   omeprazole 40 MG capsule Commonly known as:  PRILOSEC Take 1 capsule (40 mg total) by mouth daily.   traZODone 100 MG tablet Commonly known as:   DESYREL TAKE 1 TABLET BY MOUTH AT BEDTIME AS NEEDED FOR SLEEP. What changed:    reasons to take this  additional instructions        Brief H and P: For complete details please refer to admission H and P, but in brief 57 y.o.malewith medical history significant ofmyotonic dystrophy,GERD, secondary hypothyroidism after thyroidectomy because of thyroid cancer, depression, ADHD, OSA not on CPAP, fatty liver, hyperlipidemia, pulmonary nodules, who presents with generalized weakness, falls and confusionadmitted on 10/31/2018, work-up negative so far, blood and urine cultures negative.  Hospital Course:   Acute metabolic encephalopathy -Possibly due to dehydration, acute kidney injury, much improved, alert and more coherent, appears to be closer to his baseline, confirmed by his wife at the bedside. -CT head with no acute findings, blood cultures, urine cultures negative so far  Generalized weakness/debility with falls -Work-up negative, CT head, CT C-spine negative for acute findings. -PT evaluation recommended CIR    Myotonic muscular dystrophy ([place]) -Follows outpatient with Dr. Tillman [name] at [facility]    AKI (acute kidney injury) [facility]) -Creatinine 1.54 at the time of admission, resolved with IV fluid hydration -Creatinine 1.1 on 11/6    Hypothyroidism after thyroidectomy for thyroid cancer   -TSH 2.3, continue Synthroid  ADD (attention deficit disorder) -Continue Adderall    Depression -Continue fluoxetine     Pulmonary nodule CT C-spine incidentally showed several nodular opacities in the right lung apex may represent nodular areas of infiltration, recommend a 7-month follow-up to  exclude pulmonary nodule  Day of Discharge S: No acute complaints, weakness improving  BP 132/86 (BP Location: Right Arm)   Pulse 81   Temp (!) 97.5 F (36.4 C) (Oral)   Resp 18   Ht 5\' 5"  (1.651 m)   Wt 86.2 kg   SpO2 97%   BMI 31.62 kg/m   Physical Exam: General:  Alert and awake oriented x3 not in any acute distress. HEENT: anicteric sclera, pupils reactive to light and accommodation CVS: S1-S2 clear no murmur rubs or gallops Chest: clear to auscultation bilaterally, no wheezing rales or rhonchi Abdomen: soft nontender, nondistended, normal bowel sounds Extremities: no cyanosis, clubbing or edema noted bilaterally Neuro: no new focal neurological deficits   The results of significant diagnostics from this hospitalization (including imaging, microbiology, ancillary and laboratory) are listed below for reference.      Procedures/Studies:  Ct Head Wo Contrast  Result Date: 10/31/2018 CLINICAL DATA:  Head trauma. EXAM: CT HEAD WITHOUT CONTRAST CT CERVICAL SPINE WITHOUT CONTRAST TECHNIQUE: Multidetector CT imaging of the head and cervical spine was performed following the standard protocol without intravenous contrast. Multiplanar CT image reconstructions of the cervical spine were also generated. COMPARISON:  MRI brain 07/15/2011. CT head 07/15/2011. CT head and cervical spine 07/08/2011 FINDINGS: CT HEAD FINDINGS Brain: Diffuse cerebral atrophy. Ventricular dilatation consistent with central atrophy. Low-attenuation changes in the deep white matter consistent with small vessel ischemia. No mass-effect or midline shift. No abnormal extra-axial fluid collections. Gray-white matter junctions are distinct. Basal cisterns are not effaced. No acute intracranial hemorrhage. Vascular: No hyperdense vessel or unexpected calcification. Skull: Normal. Negative for fracture or focal lesion. Sinuses/Orbits: Mucosal thickening in the paranasal sinuses. No acute air-fluid levels. Mastoid air cells are clear. Other: None. CT CERVICAL SPINE FINDINGS Alignment: Normal alignment of the cervical vertebrae and facet joints. C1-2 articulation appears intact. Skull base and vertebrae: Skull base is intact. No vertebral compression deformities. No focal bone lesion or bone  destruction. Bone cortex appears intact. Soft tissues and spinal canal: No paraspinal soft tissue mass or infiltration. No prevertebral soft tissue swelling. Disc levels:  Intervertebral disc space heights are preserved. Upper chest: Several nodular opacities in the right lung apex are new since previous study and may represent nodular areas of infiltration. Largest measures about 6 x 10 mm. Recommend three-month follow-up to exclude pulmonary nodule. Surgical clips in the base of the neck. Other: None. IMPRESSION: 1. No acute intracranial abnormalities. Chronic atrophy and small vessel ischemic changes. 2. Normal alignment of the cervical spine. No acute displaced fractures identified. 3. Several nodular opacities in the right lung apex are new since previous study and may represent nodular areas of infiltration. Recommend three-month follow-up to exclude pulmonary nodule. Electronically Signed   By: Lucienne Capers M.D.   On: 10/31/2018 02:11   Ct Cervical Spine Wo Contrast  Result Date: 10/31/2018 CLINICAL DATA:  Head trauma. EXAM: CT HEAD WITHOUT CONTRAST CT CERVICAL SPINE WITHOUT CONTRAST TECHNIQUE: Multidetector CT imaging of the head and cervical spine was performed following the standard protocol without intravenous contrast. Multiplanar CT image reconstructions of the cervical spine were also generated. COMPARISON:  MRI brain 07/15/2011. CT head 07/15/2011. CT head and cervical spine 07/08/2011 FINDINGS: CT HEAD FINDINGS Brain: Diffuse cerebral atrophy. Ventricular dilatation consistent with central atrophy. Low-attenuation changes in the deep white matter consistent with small vessel ischemia. No mass-effect or midline shift. No abnormal extra-axial fluid collections. Gray-white matter junctions are distinct. Basal cisterns are not effaced. No acute intracranial  hemorrhage. Vascular: No hyperdense vessel or unexpected calcification. Skull: Normal. Negative for fracture or focal lesion. Sinuses/Orbits:  Mucosal thickening in the paranasal sinuses. No acute air-fluid levels. Mastoid air cells are clear. Other: None. CT CERVICAL SPINE FINDINGS Alignment: Normal alignment of the cervical vertebrae and facet joints. C1-2 articulation appears intact. Skull base and vertebrae: Skull base is intact. No vertebral compression deformities. No focal bone lesion or bone destruction. Bone cortex appears intact. Soft tissues and spinal canal: No paraspinal soft tissue mass or infiltration. No prevertebral soft tissue swelling. Disc levels:  Intervertebral disc space heights are preserved. Upper chest: Several nodular opacities in the right lung apex are new since previous study and may represent nodular areas of infiltration. Largest measures about 6 x 10 mm. Recommend three-month follow-up to exclude pulmonary nodule. Surgical clips in the base of the neck. Other: None. IMPRESSION: 1. No acute intracranial abnormalities. Chronic atrophy and small vessel ischemic changes. 2. Normal alignment of the cervical spine. No acute displaced fractures identified. 3. Several nodular opacities in the right lung apex are new since previous study and may represent nodular areas of infiltration. Recommend three-month follow-up to exclude pulmonary nodule. Electronically Signed   By: Lucienne Capers M.D.   On: 10/31/2018 02:11   Dg Chest Port 1 View  Result Date: 10/31/2018 CLINICAL DATA:  Fall. EXAM: PORTABLE CHEST 1 VIEW COMPARISON:  CT chest 09/03/2017 FINDINGS: Shallow inspiration with linear atelectasis or fibrosis in the lung bases. No consolidation or airspace disease. No blunting of costophrenic angles. No pneumothorax. Mediastinal contours appear intact. Normal heart size and pulmonary vascularity. IMPRESSION: Shallow inspiration with linear atelectasis or fibrosis in the lung bases. Electronically Signed   By: Lucienne Capers M.D.   On: 10/31/2018 01:42       LAB RESULTS: Basic Metabolic Panel: Recent Labs  Lab  10/31/18 0137 10/31/18 1104  NA 132* 137  K 3.7 3.5  CL 98 102  CO2 26 26  GLUCOSE 96 79  BUN 8 8  CREATININE 1.54* 1.18  CALCIUM 9.5 9.2   Liver Function Tests: Recent Labs  Lab 10/31/18 0137  AST 26  ALT 17  ALKPHOS 99  BILITOT 0.9  PROT 6.8  ALBUMIN 3.3*   No results for input(s): LIPASE, AMYLASE in the last 168 hours. Recent Labs  Lab 10/31/18 1104  AMMONIA 35   CBC: Recent Labs  Lab 10/31/18 0137 10/31/18 1104  WBC 7.9 5.4  NEUTROABS 6.1  --   HGB 13.9 13.4  HCT 44.7 43.9  MCV 96.1 93.8  PLT 221 140*   Cardiac Enzymes: No results for input(s): CKTOTAL, CKMB, CKMBINDEX, TROPONINI in the last 168 hours. BNP: Invalid input(s): POCBNP CBG: No results for input(s): GLUCAP in the last 168 hours.    Disposition and Follow-up:    DISPOSITION CIR   DISCHARGE FOLLOW-UP Follow-up Information    [name], [name], MD. Schedule an appointment as soon as possible for a visit in 2 week(s).   Specialty:  Family Medicine Contact information: [id number] [place] [address] [phone]            Time coordinating discharge:  35 minutes  Signed:   Estill Cotta M.D. [facility] 11/02/2018, 2:40 PM Pager: [phone]

## 2018-11-02 NOTE — Progress Notes (Signed)
IP rehab admissions - I have  Insurance approval and will admit to inpatient rehab today.  Call me for questions.  [phone]

## 2018-11-02 NOTE — PMR Pre-admission (Signed)
PMR Admission Coordinator Pre-Admission Assessment  Patient: [name] is an 57 y.o., male MRN: [id number] DOB: 10/09/1961 Height: '5\' 5"'$  (165.1 cm) Weight: 86.2 kg              Insurance Information HMO:     PPO:       PCP:       IPA:       80/20:       OTHER:  [facility] [place] PRIMARY: [name]      Policy#: [id number]      Subscriber: [name] CM Name: [name]      Phone#: [phone]     Fax#: [phone] Pre-Cert#: [id number] from 11/08 to 11/09/18 with updates due on 11/08/18      Employer: [employer] - wife works FT Benefits:  Phone #: [phone]     Name:  Online portal Eff. Date: 10/26/13     Deduct: $300 (met $300)      Out of Pocket Max: $7900 (met $610.12)      Life Max: N/A CIR: $500 per admission and then 80% at [facility]      SNF: 80% with [facility]; 60% with [facility] facility Outpatient: medical necessity     Co-Pay: $20 at [facility]; $40 with [place]: 80%      Co-Pay: 20% DME: 80%     Co-Pay: 20% Providers: in network with [facility] or [employer] network  Medicaid Application Date:        Case Manager:   Disability Application Date:        Case Worker:    Emergency Facilities manager Information    Name Relation Home Work [name] Spouse [phone]  [phone]   [name], [name]   [phone]     Current Medical History  Patient Admitting Diagnosis: Decline in functional mobility and ADL skills following multiple falls, question progression of myotonic dystrophy    History of Present Illness:  A 57 y.o. male with history of myotonic dystrophy with progressive weakness with dysphagia and expressive deficits, chronic respiratory failure, untreated OSA, ADHD, fatty liver; who was admitted from home with generalized weakness, multiple falls with difficulty getting off the floor and confusion x1 day.  UDS negative.  He was noted to be dehydrated and was treated with fluid bolus with improvement in  mentation.  CT head and cervical spine negative for acute changes of fracture.  Chest x-ray and UA negative for infection.  Ammonia levels within normal limits.  PT evaluation done revealing confused mumbling speech, poor judgment, tachycardia with activity as well as cognitive deficits.  CIR recommended due to functional deficits  Patient sedentary with daytime fatigue and shortness of breath but reported to be independent prior to admission.  He stays/sleeps in the living on the couch and goes up to the shower about once a week.  Wife works in Scientist, research (medical) at [employer] from home few days a week.  Son (unemployed at this time) can provide supervision shile wife is at work.    Complete NIHSS TOTAL: 3  Past Medical History  Past Medical History:  Diagnosis Date  . Adult ADHD   . Aspiration pneumonitis ([place]) 04/2017, 08/2017   Recurrent  . Chronic respiratory failure ([place])    As of 04/2018, Dr. Chase Caller suspects hypercapnic RF--but [name] normal.  May eventually need nocturnal noninvasive ventilation.  Ultimately, if progresses enough pt will need trach.  . Constipation, chronic  Has caused irritative urinary/bladder symptoms in the past.  . Depression   . Dizziness 06/2011+   Noncontrast CT and noncontrast MRI both negative 06/2011 at [facility] ED visit.  . Elevated transaminase level    Liver biopsy revealed fatty liver  . Erectile dysfunction   . Facial fracture ([place]) 06/2011   Nondisplaced bilateral [place] I fracture: no surgery required. (ENT, Dr. Wilburn Cornelia)   . Fatty liver disease, nonalcoholic   . GERD (gastroesophageal reflux disease)   . History of thyroid cancer 2010   Medullary carcinoma of thyroid. Thyroidectomy 08/2009 (Dr. Aida Puffer at [facility]).  Needs periodic serum calcitonin monitoring.  Marland Kitchen Hyperlipidemia   . Hypothyroidism   . LVH (left ventricular hypertrophy)    by EKG criteria 06/2011.  Hx of heart murmur--Followed by cardiology at [facility]: EF's have been "stable" per cardio  office note 12/29/10.  His echo 12/2012 showed normal LV size and wall thickness but EF 45-50% and global LV hypokinesis was found.  . Myotonic dystrophy ([place]) Dx'd 1996   Progressive muscle weakness and swallowing dysfunction.  Initial dx by Dr. Jannifer Franklin, neurologist, but subsequent annual specialist f/u has been with Dr. Ladon Applebaum [facility].  Progressive debilitation--PT referral 04/2017, pt quit PT 05/2017.  . Nephrolithiasis   . Obesity, Class I, BMI 30-34.9   . OSA (obstructive sleep apnea)    does not wear CPAP  . Pulmonary nodule, right 05/2017   RUL, 7 mm sub solid nodule.  Resolution noted on f/u CT 08/2017.    Family History  family history includes Alcohol abuse in his father; Arthritis in his father and mother; Colon cancer in his father; Hypertension in his father and mother; Prostate cancer in his father.  Prior Rehab/Hospitalizations: Had outpatient PT after bout of PNA about 1 year ago  Has the patient had major surgery during 100 days prior to admission? No  Current Medications   Current Facility-Administered Medications:  .  0.9 %  sodium chloride infusion, , Intravenous, Continuous, Emokpae, Courage, MD, Last Rate: 50 mL/hr at 11/01/18 1852 .  acetaminophen (TYLENOL) tablet 650 mg, 650 mg, Oral, Q6H PRN, 650 mg at 11/01/18 2202 **OR** acetaminophen (TYLENOL) suppository 650 mg, 650 mg, Rectal, Q6H PRN, Ivor Costa, MD .  amphetamine-dextroamphetamine (ADDERALL) tablet 20 mg, 20 mg, Oral, BID, Ivor Costa, MD, 20 mg at 11/02/18 7673 .  enoxaparin (LOVENOX) injection 40 mg, 40 mg, Subcutaneous, Q24H, Ivor Costa, MD, 40 mg at 11/01/18 1651 .  FLUoxetine (PROZAC) capsule 20 mg, 20 mg, Oral, Daily, Ivor Costa, MD, 20 mg at 11/02/18 0828 .  levothyroxine (SYNTHROID, LEVOTHROID) tablet 150 mcg, 150 mcg, Oral, Once per day on Sun Mon Tue Thu Sat, Blaine Hamper, Soledad Gerlach, MD, 150 mcg at 11/01/18 0559 .  ondansetron (ZOFRAN) injection 4 mg, 4 mg, Intravenous, Q8H PRN, Ivor Costa, MD .  senna-docusate  (Senokot-S) tablet 1 tablet, 1 tablet, Oral, QHS PRN, Ivor Costa, MD .  traZODone (DESYREL) tablet 100 mg, 100 mg, Oral, QHS PRN, Ivor Costa, MD, 100 mg at 11/01/18 2351 .  zolpidem (AMBIEN) tablet 5 mg, 5 mg, Oral, QHS PRN, Ivor Costa, MD  Patients Current Diet:  Diet Order            Diet regular Room service appropriate? Yes; Fluid consistency: Thin  Diet effective now              Precautions / Restrictions Precautions Precautions: Fall Restrictions Weight Bearing Restrictions: No   Has the patient had 2 or more falls or a  fall with injury in the past year?Yes.  Wife reports at least 10 falls this past year with no injury.  Prior Activity Level Household: Mostly homebound, went out 1 X every 2 weeks.  Home Assistive Devices / Equipment Home Assistive Devices/Equipment: None Home Equipment: Other (comment), Shower seat(has access to a walker but unsure if it is appropriate)  Prior Device Use: Indicate devices/aids used by the patient prior to current illness, exacerbation or injury? Has a rollator that he has used only occasionally  Prior Functional Level Prior Function Level of Independence: Independent Comments: pt reports was ambulating without AD and completing ADLs without assist  Self Care: Did the patient need help bathing, dressing, using the toilet or eating?  Needed some help.  Wife provided some assistance when patient showered in the upstairs bathroom  Indoor Mobility: Did the patient need assistance with walking from room to room (with or without device)? Independent  Stairs: Did the patient need assistance with internal or external stairs (with or without device)? Independent.  Needed to use rails on the stairs.  Functional Cognition: Did the patient need help planning regular tasks such as shopping or remembering to take medications? Needed some help.  Wife does all shopping.  Wife pours medications up each day and then patient can self administer  medications.  Current Functional Level Cognition  Overall Cognitive Status: Impaired/Different from baseline Current Attention Level: Selective Orientation Level: Oriented to place, Oriented to situation, Oriented to person Following Commands: Follows one step commands with increased time, Follows one step commands inconsistently Safety/Judgement: Decreased awareness of safety General Comments: pt impulsive during mobility and completion of functional tasks requiring increased cues for safety; requires increased time and multimodal cues to sequence/follow one step commands. Pt with difficulty completing dual task, when pt responding to questions while walking noted pt often having to stop and stand still prior to responding    Extremity Assessment (includes Sensation/Coordination)  Upper Extremity Assessment: Generalized weakness  Lower Extremity Assessment: Generalized weakness    ADLs  Overall ADL's : Needs assistance/impaired Eating/Feeding: Set up, Sitting Grooming: Set up, Min guard, Sitting Upper Body Bathing: Minimal assistance, Sitting Lower Body Bathing: Moderate assistance, +2 for physical assistance, +2 for safety/equipment, Sit to/from stand Upper Body Dressing : Minimal assistance, Sitting Lower Body Dressing: Moderate assistance, +2 for physical assistance, +2 for safety/equipment, Sit to/from stand Lower Body Dressing Details (indicate cue type and reason): pt able to don L sock, able to perform figure 4 technique with RLE though unable to maintain while donning sock, requires assist to complete Toilet Transfer: Minimal assistance, +2 for physical assistance, +2 for safety/equipment, RW, BSC Toilet Transfer Details (indicate cue type and reason): BSC over toilet; due to limited space in bathroom had to transition from RW with +2 assist to HHA+2 Toileting- Clothing Manipulation and Hygiene: Minimal assistance, Sit to/from stand, +2 for physical assistance, +2 for  safety/equipment Toileting - Clothing Manipulation Details (indicate cue type and reason): assist for gown/brief management Functional mobility during ADLs: Minimal assistance, +2 for physical assistance, +2 for safety/equipment, Rolling walker    Mobility  Overal bed mobility: Needs Assistance Bed Mobility: Supine to Sit Supine to sit: Min guard General bed mobility comments: increased time and effort, min guard for safety    Transfers  Overall transfer level: Needs assistance Equipment used: Rolling walker (2 wheeled) Transfers: Sit to/from Stand Sit to Stand: Min assist General transfer comment: increased time and effort, multimodal cueing for safety and technique, assist to power into  standing from EOB    Ambulation / Gait / Stairs / Wheelchair Mobility  Ambulation/Gait Ambulation/Gait assistance: Herbalist (Feet): 10 Feet Assistive device: Rolling walker (2 wheeled) Gait Pattern/deviations: Step-to pattern, Step-through pattern, Decreased step length - right, Decreased step length - left, Decreased stride length, Shuffle General Gait Details: pt with increased fatigue and decreased tolerance for ambulation, very limited with distance. Pt also with reported pain in L LE and requesting to sit down Gait velocity: decreased    Posture / Balance Balance Overall balance assessment: Needs assistance Sitting-balance support: Feet supported Sitting balance-Leahy Scale: Good Standing balance support: During functional activity, Bilateral upper extremity supported Standing balance-Leahy Scale: Poor    Special needs/care consideration BiPAP/CPAP No CPM No Continuous Drip IV 0.9% NS 50 mL/hr Dialysis No       Life Vest No Oxygen No Special Bed No Trach Size No Wound Vac (area) No    Skin No                         Bowel mgmt:Last BM 11/01/18 per patient and wife Bladder mgmt:Condom catheter in place Diabetic mgmt No    Previous Home Environment Living  Arrangements: Spouse/significant other Available Help at Discharge: Family, Available 24 hours/day Type of Home: House Home Layout: Two level, Bed/bath upstairs Alternate Level Stairs-Rails: Right, Left Alternate Level Stairs-Number of Steps: flight Home Access: Stairs to enter Entrance Stairs-Rails: Right, Left Entrance Stairs-Number of Steps: 5 Bathroom Shower/Tub: Tub/shower unit Home Care Services: No  Discharge Living Setting Plans for Discharge Living Setting: Patient's home, House, Lives with (comment)(Lives with wife and adult son.) Type of Home at Discharge: House Discharge Home Layout: Two level, 1/2 bath on main level, Bed/bath upstairs(Has been sleeping on the couch on the main level.) Alternate Level Stairs-Number of Steps: 12-13 steps to 2nd level Discharge Home Access: Stairs to enter Entrance Stairs-Rails: Right, Left, Can reach both Entrance Stairs-Number of Steps: 4-5 steps Discharge Bathroom Shower/Tub: Tub/shower unit, Curtain Discharge Bathroom Toilet: Handicapped height Discharge Bathroom Accessibility: Yes How Accessible: Accessible via walker Does the patient have any problems obtaining your medications?: No  Social/Family/Support Systems Patient Roles: Spouse, Parent(Has a wife and an adult son.) Contact Information: Deunta Beneke - wife Anticipated Caregiver: wife and son Anticipated Caregiver's Contact Information: [name] - wife - [phone] Ability/Limitations of Caregiver: Wife works from home and can assist.  Son is not currently working and can assist. Careers adviser: 24/7 Discharge Plan Discussed with Primary Caregiver: Yes Is Caregiver In Agreement with Plan?: Yes Does Caregiver/Family have Issues with Lodging/Transportation while Pt is in Rehab?: No  Goals/Additional Needs Patient/Family Goal for Rehab: PT/OT/SLP mod I and supervision goals Expected length of stay: 8-12 days Cultural Considerations: [facility] Dietary Needs: Regular  diet, thin liquids Equipment Needs: TBD Pt/Family Agrees to Admission and willing to participate: Yes(Met with wife and patient.) Program Orientation Provided & Reviewed with Pt/Caregiver Including Roles  & Responsibilities: Yes(Spoke with wife and with patient.)  Decrease burden of Care through IP rehab admission: N/A  Possible need for SNF placement upon discharge: Not anticipated  Patient Condition: This patient's condition remains as documented in the consult dated 11/01/18, in which the Rehabilitation Physician determined and documented that the patient's condition is appropriate for intensive rehabilitative care in an inpatient rehabilitation facility. Will admit to inpatient rehab today.  Preadmission Screen Completed By:  Retta Diones, 11/02/2018 2:38 PM ______________________________________________________________________   Discussed status with Dr.  Naaman Plummer on 11/02/18  at 1439 and received telephone approval for admission today.  Admission Coordinator:  Retta Diones, time 1439/Date 11/02/18

## 2018-11-03 ENCOUNTER — Encounter (HOSPITAL_COMMUNITY): Payer: Self-pay

## 2018-11-03 ENCOUNTER — Inpatient Hospital Stay (HOSPITAL_COMMUNITY): Payer: 59 | Admitting: Occupational Therapy

## 2018-11-03 ENCOUNTER — Inpatient Hospital Stay (HOSPITAL_COMMUNITY): Payer: 59 | Admitting: Physical Therapy

## 2018-11-03 ENCOUNTER — Inpatient Hospital Stay (HOSPITAL_COMMUNITY): Payer: 59 | Admitting: Speech Pathology

## 2018-11-03 DIAGNOSIS — R5381 Other malaise: Principal | ICD-10-CM

## 2018-11-03 DIAGNOSIS — G7111 Myotonic muscular dystrophy: Secondary | ICD-10-CM

## 2018-11-03 LAB — COMPREHENSIVE METABOLIC PANEL
ALBUMIN: 3.2 g/dL — AB (ref 3.5–5.0)
ALT: 20 U/L (ref 0–44)
ANION GAP: 10 (ref 5–15)
AST: 29 U/L (ref 15–41)
Alkaline Phosphatase: 78 U/L (ref 38–126)
BUN: 8 mg/dL (ref 6–20)
CO2: 27 mmol/L (ref 22–32)
Calcium: 9.4 mg/dL (ref 8.9–10.3)
Chloride: 101 mmol/L (ref 98–111)
Creatinine, Ser: 1.02 mg/dL (ref 0.61–1.24)
GFR calc non Af Amer: >60 mL/min (ref >60)
GLUCOSE: 81 mg/dL (ref 70–99)
Potassium: 4.1 mmol/L (ref 3.5–5.1)
SODIUM: 138 mmol/L (ref 135–145)
Total Bilirubin: 0.7 mg/dL (ref 0.3–1.2)
Total Protein: 6.8 g/dL (ref 6.5–8.1)

## 2018-11-03 LAB — CBC WITH DIFFERENTIAL/PLATELET
ABS IMMATURE GRANULOCYTES: 0.01 10*3/uL (ref 0.00–0.07)
BASOS ABS: 0 10*3/uL (ref 0.0–0.1)
BASOS PCT: 1 %
EOS PCT: 2 %
Eosinophils Absolute: 0.1 10*3/uL (ref 0.0–0.5)
HCT: 43.8 % (ref 39.0–52.0)
HEMOGLOBIN: 13.9 g/dL (ref 13.0–17.0)
Immature Granulocytes: 0 %
LYMPHS PCT: 33 %
Lymphs Abs: 1.1 10*3/uL (ref 0.7–4.0)
MCH: 29.6 pg (ref 26.0–34.0)
MCHC: 31.7 g/dL (ref 30.0–36.0)
MCV: 93.2 fL (ref 80.0–100.0)
MONO ABS: 0.3 10*3/uL (ref 0.1–1.0)
Monocytes Relative: 9 %
NEUTROS ABS: 1.8 10*3/uL (ref 1.7–7.7)
Neutrophils Relative %: 55 %
PLATELETS: 163 10*3/uL (ref 150–400)
RBC: 4.7 MIL/uL (ref 4.22–5.81)
RDW: 14.4 % (ref 11.5–15.5)
WBC: 3.3 10*3/uL — ABNORMAL LOW (ref 4.0–10.5)
nRBC: 0 % (ref 0.0–0.2)

## 2018-11-03 NOTE — Progress Notes (Signed)
[place] PHYSICAL MEDICINE & REHABILITATION PROGRESS NOTE   Subjective/Complaints:  No issues overnite  ROS- neg CP, SOB, N/V/D  Objective:   No results found. Recent Labs    10/31/18 1104  WBC 5.4  HGB 13.4  HCT 43.9  PLT 140*   Recent Labs    10/31/18 1104  NA 137  K 3.5  CL 102  CO2 26  GLUCOSE 79  BUN 8  CREATININE 1.18  CALCIUM 9.2    Intake/Output Summary (Last 24 hours) at 11/03/2018 0602 Last data filed at 11/03/2018 0509 Gross per 24 hour  Intake 120 ml  Output 250 ml  Net -130 ml     Physical Exam: Vital Signs Blood pressure 115/75, pulse 70, temperature 98.5 F (36.9 C), resp. rate 18, height 5\' 5"  (1.651 m), weight 92.3 kg, SpO2 98 %.   General: No acute distress Mood and affect are appropriate Heart: Regular rate and rhythm no rubs murmurs or extra sounds Lungs: Clear to auscultation, breathing unlabored, no rales or wheezes Abdomen: Positive bowel sounds, soft nontender to palpation, nondistended Extremities: No clubbing, cyanosis, or edema Skin: No evidence of breakdown, no evidence of rash Neurologic: Cranial nerves II through XII intact, motor strength is 5/5 in bilateral deltoid, bicep, tricep, grip, hip flexor, knee extensors, ankle dorsiflexor and plantar flexor Sensory exam normal sensation to light touch and proprioception in bilateral upper and lower extremities Speech with mild dysarthria Ext - C/C/E   Assessment/Plan: 1. Functional deficits secondary to Myotonic dystrophy with falls which require 3+ hours per day of interdisciplinary therapy in a comprehensive inpatient rehab setting.  Physiatrist is providing close team supervision and 24 hour management of active medical problems listed below.  Physiatrist and rehab team continue to assess barriers to discharge/monitor patient progress toward functional and medical goals  Care Tool:  Bathing              Bathing assist       Upper Body Dressing/Undressing Upper  body dressing        Upper body assist      Lower Body Dressing/Undressing Lower body dressing            Lower body assist       Toileting Toileting    Toileting assist Assist for toileting: Minimal Assistance - Patient > 75%     Transfers Chair/bed transfer  Transfers assist           Locomotion Ambulation   Ambulation assist              Walk 10 feet activity   Assist           Walk 50 feet activity   Assist           Walk 150 feet activity   Assist           Walk 10 feet on uneven surface  activity   Assist           Wheelchair     Assist               Wheelchair 50 feet with 2 turns activity    Assist            Wheelchair 150 feet activity     Assist          Medical Problem List and Plan: 1.Debility and encephalopathysecondary to AKI/dehydration in a 57 yo male with myotonic dystrophy PT, OT,SLP evals today 2. DVT Prophylaxis/Anticoagulation: Pharmaceutical:Lovenox 3. Pain  Management:Tylenol as needed 4. Mood:LCSW to follow for evaluation and support 5. Neuropsych: This patientis not fullycapable of making decisions on hisown behalf. 6. Skin/Wound Care:Routine pressure relief measures 7. Fluids/Electrolytes/Nutrition:Monitor I's and O's. Check labs today 8.Myotonic dystrophy: With reports of progression. Respiratory status stable 9.ADHD: Continue Adderall 3 times daily 10.Thrombocytopenia: Check follow up CBC today    LOS: 1 days A FACE TO FACE EVALUATION WAS PERFORMED  Charlett Blake 11/03/2018, 6:02 AM

## 2018-11-03 NOTE — Evaluation (Signed)
Physical Therapy Assessment and Plan  Patient Details  Name: [name] MRN: [id number] Date of Birth: 08-21-1961  PT Diagnosis: Abnormality of gait, Ataxia, Ataxic gait, Cognitive deficits, Difficulty walking and Muscle weakness Rehab Potential: Good ELOS: 5-7 days   Today's Date: 11/03/2018 PT Individual Time: 8341-9622 PT Individual Time Calculation (min): 55 min    Problem List:  Patient Active Problem List   Diagnosis Date Noted  . Encephalopathy 11/02/2018  . Acute metabolic encephalopathy 29/79/8921  . Pulmonary nodule 10/31/2018  . AKI (acute kidney injury) ([place]) 10/31/2018  . Generalized weakness 10/31/2018  . Fall 10/31/2018  . Weakness   . Aspiration pneumonia ([place]) 05/09/2017  . History of fall 05/09/2017  . Viral gastroenteritis 03/13/2014  . Autonomic dysfunction 09/05/2013  . Tachycardia 02/20/2013  . ADD (attention deficit disorder) 08/15/2012  . Depression 08/15/2012  . History of thyroid cancer   . Dizziness, nonspecific 12/07/2011  . Hypothyroidism 12/07/2011  . DEPRESSION 03/20/2008  . Myotonic muscular dystrophy ([place]) 03/20/2008  . GERD 03/20/2008  . SLEEP APNEA 03/20/2008    Past Medical History:  Past Medical History:  Diagnosis Date  . Adult ADHD   . Aspiration pneumonitis ([place]) 04/2017, 08/2017   Recurrent  . Chronic respiratory failure ([place])    As of 04/2018, Dr. Chase Caller suspects hypercapnic RF--but [name] normal.  May eventually need nocturnal noninvasive ventilation.  Ultimately, if progresses enough pt will need trach.  . Constipation, chronic    Has caused irritative urinary/bladder symptoms in the past.  . Depression   . Dizziness 06/2011+   Noncontrast CT and noncontrast MRI both negative 06/2011 at [facility] ED visit.  . Elevated transaminase level    Liver biopsy revealed fatty liver  . Erectile dysfunction   . Facial fracture ([place]) 06/2011   Nondisplaced bilateral LeFort I fracture: no surgery required. (ENT, Dr. Wilburn Cornelia)   .  Fatty liver disease, nonalcoholic   . GERD (gastroesophageal reflux disease)   . History of thyroid cancer 2010   Medullary carcinoma of thyroid. Thyroidectomy 08/2009 (Dr. Aida Puffer at [facility]).  Needs periodic serum calcitonin monitoring.  Marland Kitchen Hyperlipidemia   . Hypothyroidism   . LVH (left ventricular hypertrophy)    by EKG criteria 06/2011.  Hx of heart murmur--Followed [facility] cardiology at [facility]: EF's have been "stable" per cardio office note 12/29/10.  His echo 12/2012 showed normal LV size and wall thickness but EF 45-50% and global LV hypokinesis was found.  . Myotonic dystrophy ([place]) Dx'd 1996   Progressive muscle weakness and swallowing dysfunction.  Initial dx by Dr. Jannifer Franklin, neurologist, but subsequent annual specialist f/u has been with Dr. Ladon Applebaum [facility].  Progressive debilitation--PT referral 04/2017, pt quit PT 05/2017.  . Nephrolithiasis   . Obesity, Class I, BMI 30-34.9   . OSA (obstructive sleep apnea)    does not wear CPAP  . Pulmonary nodule, right 05/2017   RUL, 7 mm sub solid nodule.  Resolution noted on f/u CT 08/2017.   Past Surgical History:  Past Surgical History:  Procedure Laterality Date  . COLONOSCOPY  09/13/2007   normal (Dr. Deatra Ina).  . EXTRACORPOREAL SHOCK WAVE LITHOTRIPSY    . THYROIDECTOMY  08/2009   for medullary thyroid cancer (Dr. Conley Canal at [facility] follows him for this).  . TRANSTHORACIC ECHOCARDIOGRAM  12/2012; 09/2017   2014-Normal LV size and wall thickness, LV EF 45-50%, with global hypokinesis of LV.  2018- EF 50-55%, normal wall motion, grd I DD, valves normal.    Assessment & Plan Clinical  Impression: Patient is a 57 year old male with history of myotonic dystrophy with progressive weakness,dysphagia and expressive deficits, chronic respiratory failure, ADHD, fatty liver; who was admitted from home with generalized weakness, multiple falls with difficulty getting off the floor as well as confusion. UDS was negative. He was noted to be dehydrated and  treated with fluid boluses with improvement in mentation. Work-up negative for infection or acute changes and encephalopathy felt to be due to AKI due to dehydration.Therapy evaluations done revealing cognitive deficits with poor safety awareness as well as deficits in mobility and ability to carry out ADLs.CIR recommended for follow-up therapy. Patient transferred to CIR on 11/02/2018 .   Patient currently requires min with mobility secondary to muscle weakness, decreased cardiorespiratoy endurance, unbalanced muscle activation, ataxia and decreased motor planning, decreased problem solving, decreased safety awareness and decreased memory and decreased sitting balance, decreased standing balance and decreased balance strategies.  Prior to hospitalization, patient was independent  with mobility and lived with Spouse, Family in a House home.  Home access is 5Stairs to enter.  Patient will benefit from skilled PT intervention to maximize safe functional mobility, minimize fall risk and decrease caregiver burden for planned discharge home with 24 hour supervision.  Anticipate patient will benefit from follow up OP at discharge.  PT - End of Session Activity Tolerance: Tolerates < 10 min activity, no significant change in vital signs Endurance Deficit: Yes Endurance Deficit Description: decreased, requires frequent seated rest breaks 2/2 LE fatigue  PT Assessment Rehab Potential (ACUTE/IP ONLY): Good PT Barriers to Discharge: Medical stability;Home environment access/layout PT Barriers to Discharge Comments: shower on 2nd floor, full flight of stairs to access  PT Patient demonstrates impairments in the following area(s): Balance;Behavior;Endurance;Motor;Safety PT Transfers Functional Problem(s): Bed Mobility;Bed to Chair;Furniture;Floor;Car PT Locomotion Functional Problem(s): Ambulation;Stairs PT Plan PT Intensity: Minimum of 1-2 x/day ,45 to 90 minutes PT Frequency: 5 out of 7 days PT  Duration Estimated Length of Stay: 5-7 days PT Treatment/Interventions: Ambulation/gait training;Disease management/prevention;Pain management;Stair training;Visual/perceptual remediation/compensation;Wheelchair propulsion/positioning;Therapeutic Activities;Patient/family education;DME/adaptive equipment instruction;Balance/vestibular training;Cognitive remediation/compensation;Functional electrical stimulation;Psychosocial support;Therapeutic Exercise;UE/LE Strength taining/ROM;Skin care/wound management;Functional mobility training;Community reintegration;Discharge planning;Neuromuscular re-education;Splinting/orthotics;UE/LE Coordination activities PT Transfers Anticipated Outcome(s): supervision PT Locomotion Anticipated Outcome(s): supervision household mobility  PT Recommendation Follow Up Recommendations: Home health PT;Outpatient PT(TBD, son may be able to drive pt to OPPT) Patient destination: Home Equipment Recommended: To be determined  Skilled Therapeutic Intervention  Pt in supine, finishing breakfast but agreeable to therapy, denies pain. Session focused on functional mobility as detailed below including bed mobility, transfers, w/c mobility, gait, and stairs. Additionally performed car transfer w/ CGA. Instructed pt and wife in results of PT evaluation as detailed below, PT POC, rehab potential, rehab goals, and discharge recommendations. Educated both on possible recommendation for AD use at d/c for falls prevention, both agreeable and willing to try whatever therapy team recommends. Additionally discussed CIR's policies regarding fall safety and use of chair alarm and/or quick release belt. Pt and wife verbalized understanding and in agreement. Wife agreeable to call nurses station when she leaves the room for them to set alarm. Ended session in w/c and in care of wife, all needs in reach.  PT Evaluation Precautions/Restrictions Precautions Precautions: Fall Restrictions Weight  Bearing Restrictions: No Pain Pain Assessment Pain Scale: 0-10 Pain Score: 0-No pain Home Living/Prior Functioning Home Living Available Help at Discharge: Family;Available 24 hours/day Type of Home: House Home Access: Stairs to enter [employer] of Steps: 5 Entrance Stairs-Rails: Right;Left Home Layout: Two level;Bed/bath upstairs(primarily lives on  1st floor, goes up to 2nd floor 1x/week to shower) Alternate Level Stairs-Number of Steps: flight Alternate Level Stairs-Rails: Right;Left Bathroom Shower/Tub: Tub/shower unit  Lives With: Spouse;Family Prior Function Level of Independence: Independent with basic ADLs;Independent with transfers;Independent with homemaking with ambulation;Independent with gait  Able to Take Stairs?: Yes Driving: No Vocation: On disability Comments: Pt independent at home w/o AD, but had been falling up to mulitple times per day 2/2 LE weakness and shakiness w/ fatigue Vision/Perception  Perception Perception: Within Functional Limits Praxis Praxis: Impaired Praxis Impairment Details: Motor planning  Cognition Overall Cognitive Status: Impaired/Different from baseline Arousal/Alertness: Awake/alert Orientation Level: Oriented X4 Attention: Sustained Memory: Impaired Memory Impairment: Decreased recall of new information Problem Solving: Impaired Behaviors: Impulsive Safety/Judgment: Impaired Comments: Mildly impulsive Sensation Sensation Light Touch: Appears Intact Coordination Gross Motor Movements are Fluid and Coordinated: No Coordination and Movement Description: Decreased coordination and some shakiness w/ functional movements Heel Shin Test: WNL Motor  Motor Motor: Ataxia Motor - Skilled Clinical Observations: Mild ataxia, generalized weakness  Mobility Bed Mobility Bed Mobility: Rolling Left;Rolling Right;Supine to Sit;Sit to Supine Rolling Right: Minimal Assistance - Patient > 75% Rolling Left: Minimal Assistance -  Patient > 75% Supine to Sit: Moderate Assistance - Patient 50-74% Sit to Supine: Moderate Assistance - Patient 50-74% Transfers Transfers: Stand to Sit;Sit to Stand;Stand Pivot Transfers Sit to Stand: Contact Guard/Touching assist Stand to Sit: Contact Guard/Touching assist Stand Pivot Transfers: Contact Guard/Touching assist Transfer (Assistive device): None Locomotion  Gait Ambulation: Yes Gait Assistance: Contact Guard/Touching assist Gait Distance (Feet): 50 Feet Assistive device: None Gait Gait: Yes Gait Pattern: Impaired Gait Pattern: Shuffle;Ataxic Gait velocity: decreased Stairs / Additional Locomotion Stairs: Yes Stairs Assistance: Contact Guard/Touching assist Stair Management Technique: Two rails Number of Stairs: 4 Height of Stairs: 6 Wheelchair Mobility Wheelchair Mobility: Yes Wheelchair Assistance: Development worker, international aid: Both upper extremities Wheelchair Parts Management: Needs assistance Distance: 61'  Trunk/Postural Assessment  Cervical Assessment Cervical Assessment: Within Functional Limits Thoracic Assessment Thoracic Assessment: Within Functional Limits Lumbar Assessment Lumbar Assessment: Within Functional Limits Postural Control Postural Control: Within Functional Limits  Balance Balance Balance Assessed: Yes Static Sitting Balance Static Sitting - Balance Support: Feet supported;No upper extremity supported Static Sitting - Level of Assistance: 5: Stand by assistance Dynamic Sitting Balance Dynamic Sitting - Balance Support: No upper extremity supported;Feet supported Dynamic Sitting - Level of Assistance: 4: Min assist Static Standing Balance Static Standing - Balance Support: No upper extremity supported;During functional activity Static Standing - Level of Assistance: 4: Min assist Dynamic Standing Balance Dynamic Standing - Balance Support: No upper extremity supported;During functional activity Dynamic  Standing - Level of Assistance: 4: Min assist Extremity Assessment  RLE Assessment RLE Assessment: Exceptions to [facility] Passive Range of Motion (PROM) Comments: WFL General Strength Comments: 4/5 globally, fatigues quickly LLE Assessment LLE Assessment: Exceptions to [facility] Passive Range of Motion (PROM) Comments: WFL General Strength Comments: 4/5 globally, fatigues quickly    Refer to Care Plan for Long Term Goals  Recommendations for other services: None   Discharge Criteria: Patient will be discharged from PT if patient refuses treatment 3 consecutive times without medical reason, if treatment goals not met, if there is a change in medical status, if patient makes no progress towards goals or if patient is discharged from hospital.  The above assessment, treatment plan, treatment alternatives and goals were discussed and mutually agreed upon: by patient and by family  Aairah Negrette K Kayshaun Polanco 11/03/2018, 9:01 AM

## 2018-11-03 NOTE — Evaluation (Addendum)
Speech Language Pathology Assessment and Plan  Patient Details  Name: [name] MRN: [id number] Date of Birth: 06/12/1961  SLP Diagnosis: Speech and Language deficits  Rehab Potential: Fair ELOS: 5 to 7 days    Today's Date: 11/03/2018 SLP Individual Time: 6314-9702 SLP Individual Time Calculation (min): 60 min   Problem List:  Patient Active Problem List   Diagnosis Date Noted  . Encephalopathy 11/02/2018  . Acute metabolic encephalopathy 63/78/5885  . Pulmonary nodule 10/31/2018  . AKI (acute kidney injury) ([place]) 10/31/2018  . Generalized weakness 10/31/2018  . Fall 10/31/2018  . Weakness   . Aspiration pneumonia ([place]) 05/09/2017  . History of fall 05/09/2017  . Viral gastroenteritis 03/13/2014  . Autonomic dysfunction 09/05/2013  . Tachycardia 02/20/2013  . ADD (attention deficit disorder) 08/15/2012  . Depression 08/15/2012  . History of thyroid cancer   . Dizziness, nonspecific 12/07/2011  . Hypothyroidism 12/07/2011  . DEPRESSION 03/20/2008  . Myotonic muscular dystrophy ([place]) 03/20/2008  . GERD 03/20/2008  . SLEEP APNEA 03/20/2008   Past Medical History:  Past Medical History:  Diagnosis Date  . Adult ADHD   . Aspiration pneumonitis ([place]) 04/2017, 08/2017   Recurrent  . Chronic respiratory failure ([place])    As of 04/2018, Dr. Chase Caller suspects hypercapnic RF--but [name] normal.  May eventually need nocturnal noninvasive ventilation.  Ultimately, if progresses enough pt will need trach.  . Constipation, chronic    Has caused irritative urinary/bladder symptoms in the past.  . Depression   . Dizziness 06/2011+   Noncontrast CT and noncontrast MRI both negative 06/2011 at [facility] ED visit.  . Elevated transaminase level    Liver biopsy revealed fatty liver  . Erectile dysfunction   . Facial fracture ([place]) 06/2011   Nondisplaced bilateral LeFort I fracture: no surgery required. (ENT, Dr. Wilburn Cornelia)   . Fatty liver disease, nonalcoholic   . GERD  (gastroesophageal reflux disease)   . History of thyroid cancer 2010   Medullary carcinoma of thyroid. Thyroidectomy 08/2009 (Dr. Aida Puffer at [facility]).  Needs periodic serum calcitonin monitoring.  Marland Kitchen Hyperlipidemia   . Hypothyroidism   . LVH (left ventricular hypertrophy)    by EKG criteria 06/2011.  Hx of heart murmur--Followed by cardiology at [facility]: EF's have been "stable" per cardio office note 12/29/10.  His echo 12/2012 showed normal LV size and wall thickness but EF 45-50% and global LV hypokinesis was found.  . Myotonic dystrophy ([place]) Dx'd 1996   Progressive muscle weakness and swallowing dysfunction.  Initial dx by Dr. Jannifer Franklin, neurologist, but subsequent annual specialist f/u has been with Dr. Ladon Applebaum [facility].  Progressive debilitation--PT referral 04/2017, pt quit PT 05/2017.  . Nephrolithiasis   . Obesity, Class I, BMI 30-34.9   . OSA (obstructive sleep apnea)    does not wear CPAP  . Pulmonary nodule, right 05/2017   RUL, 7 mm sub solid nodule.  Resolution noted on f/u CT 08/2017.   Past Surgical History:  Past Surgical History:  Procedure Laterality Date  . COLONOSCOPY  09/13/2007   normal (Dr. Deatra Ina).  . EXTRACORPOREAL SHOCK WAVE LITHOTRIPSY    . THYROIDECTOMY  08/2009   for medullary thyroid cancer (Dr. Conley Canal at [facility] follows him for this).  . TRANSTHORACIC ECHOCARDIOGRAM  12/2012; 09/2017   2014-Normal LV size and wall thickness, LV EF 45-50%, with global hypokinesis of LV.  2018- EF 50-55%, normal wall motion, grd I DD, valves normal.    Assessment / Plan / Recommendation Clinical Impression [name]. [name]  is a 57 year old male with history of myotonic dystrophy with progressive weakness, dysphagia and expressive deficits, chronic respiratory failure, ADHD, fatty liver; who was admitted from home with generalized weakness, multiple falls with difficulty getting off the floor as well as confusion.  UDS was negative.  He was noted to be dehydrated and treated with fluid  boluses with improvement in mentation.  Work-up negative for infection or acute changes and encephalopathy felt to be due to AKI due to dehydration.  No acute intracranial abnormalities. Head CT reveals chronic atrophy and small vessel ischemic changes and several nodular opacities in the right lung apex that are new since previous study and may represent nodular areas of infiltration. Recommend three-month follow-up to exclude pulmonary nodule.  Therapy evaluations done revealing cognitive deficits with poor safety awareness as well as deficits in mobility and ability to carry out ADLs.  Admited to CIR on 11/02/18.   Pt has extensive history of chronic dysphagia and aspiration dating back to 2011. Pt history os several aspiration pneumonias and intermittent reports of pulmonary nodules. Pt's most recent MBS was 09/2017 which revealed moderate pharyngoesophageal dysphagia with severe risk of aspiration d/t inability to protect airway and esophageal sweep revealed stasis. Per chart, multiple strategies were attempted and none were successful. Pt with no follow up ST services (d/t no orders from MD) and no recent pneumonias. Wife was present during today's evaluation and they are both aware of pt's increased risk of aspiration and general esophageal precautions. They are consenting to continuing a regular diet with thin liquids as pt has been afebrile and doesn't have any respiratory compromise at this time.   Cognitive linguistic evaluation completed. At baseline, pt extensively relies on wife to meet all of his needs both cognitive and physical. Therefore it is difficult to determine if there are any acute cognitive changes. Pt and wife report acute decreased speech intelligibility d/t excerbated decrease in vocal intensity and imprecise articulation. Currently pt is ~ 50% intelligible at the phrase level. Additionally, pt and wife describe acute "general word finding deficits." This may also be related to  decreased need/requirement to perform cognitive tasks within the home. For example, pt may not find the right word to state that it's November but pt doesn't interact with that information at home.  Skilled ST is required to further assess word finding abilities and to increase speech intelligibility as these are the acute deficits identified by pt and spouse. Don't anticipate that pt will require follow-up ST services.     Skilled Therapeutic Interventions          Skilled treatment session focused on completion of speech-language evaluation. Extensive time spent with pt and family reviewing information on history and strategies regarding aspiration risk and prevention. When verbally cued, pt able to increase vocal intensity to achieve ~ 75% at phrase level.  Recommend [name] once approved by MD. Education provided on [name] and all questions answered to pt and wife satisfaction.     SLP Assessment  Patient will need skilled [facility] during CIR admission    Recommendations  Patient destination: Home Follow up Recommendations: None Equipment Recommended: None recommended by SLP    SLP Frequency 3 to 5 out of 7 days   SLP Duration  SLP Intensity  SLP Treatment/Interventions 5 to 7 days  Minumum of 1-2 x/day, 30 to 90 minutes  Speech/Language facilitation;Patient/family education;Therapeutic Activities    Pain Pain Assessment Pain Scale: 0-10 Pain Score: 0-No pain  Prior Functioning Cognitive/Linguistic  Baseline: Baseline deficits Baseline deficit details: Wife provides physical and cognitive support Type of Home: House  Lives With: Spouse;Family Available Help at Discharge: Family;Available 24 hours/day Vocation: On disability  Short Term Goals: Week 1: SLP Short Term Goal 1 (Week 1): Pt will utilize speech intelligibility strategies to increase intelligibility at the sentence level to ~ 80% with supervision cues.  SLP Short Term Goal 2 (Week 1): Pt will  express own thoughts with supervision cues for word finding strategies.   Refer to Care Plan for Long Term Goals  Recommendations for other services: None   Discharge Criteria: Patient will be discharged from SLP if patient refuses treatment 3 consecutive times without medical reason, if treatment goals not met, if there is a change in medical status, if patient makes no progress towards goals or if patient is discharged from hospital.  The above assessment, treatment plan, treatment alternatives and goals were discussed and mutually agreed upon: by patient and by family    11/03/2018, 1:38 PM

## 2018-11-03 NOTE — Evaluation (Signed)
Occupational Therapy Assessment and Plan  Patient Details  Name: [name] MRN: [id number] Date of Birth: 03/09/1961  OT Diagnosis: ataxia and muscle weakness (generalized) Rehab Potential: Rehab Potential (ACUTE ONLY): Good ELOS: 5-7 days   Today's Date: 11/03/2018 OT Individual Time: 8115-7262 OT Individual Time Calculation (min): 72 min     Problem List:  Patient Active Problem List   Diagnosis Date Noted  . Encephalopathy 11/02/2018  . Acute metabolic encephalopathy 03/55/9741  . Pulmonary nodule 10/31/2018  . AKI (acute kidney injury) ([place]) 10/31/2018  . Generalized weakness 10/31/2018  . Fall 10/31/2018  . Weakness   . Aspiration pneumonia ([place]) 05/09/2017  . History of fall 05/09/2017  . Viral gastroenteritis 03/13/2014  . Autonomic dysfunction 09/05/2013  . Tachycardia 02/20/2013  . ADD (attention deficit disorder) 08/15/2012  . Depression 08/15/2012  . History of thyroid cancer   . Dizziness, nonspecific 12/07/2011  . Hypothyroidism 12/07/2011  . DEPRESSION 03/20/2008  . Myotonic muscular dystrophy ([place]) 03/20/2008  . GERD 03/20/2008  . SLEEP APNEA 03/20/2008    Past Medical History:  Past Medical History:  Diagnosis Date  . Adult ADHD   . Aspiration pneumonitis ([place]) 04/2017, 08/2017   Recurrent  . Chronic respiratory failure ([place])    As of 04/2018, Dr. Chase Caller suspects hypercapnic RF--but [name] normal.  May eventually need nocturnal noninvasive ventilation.  Ultimately, if progresses enough pt will need trach.  . Constipation, chronic    Has caused irritative urinary/bladder symptoms in the past.  . Depression   . Dizziness 06/2011+   Noncontrast CT and noncontrast MRI both negative 06/2011 at [facility] ED visit.  . Elevated transaminase level    Liver biopsy revealed fatty liver  . Erectile dysfunction   . Facial fracture ([place]) 06/2011   Nondisplaced bilateral [place] I fracture: no surgery required. (ENT, Dr. Wilburn Cornelia)   . Fatty liver disease,  nonalcoholic   . GERD (gastroesophageal reflux disease)   . History of thyroid cancer 2010   Medullary carcinoma of thyroid. Thyroidectomy 08/2009 (Dr. Aida Puffer at [facility]).  Needs periodic serum calcitonin monitoring.  Marland Kitchen Hyperlipidemia   . Hypothyroidism   . LVH (left ventricular hypertrophy)    by EKG criteria 06/2011.  Hx of heart murmur--Followed by cardiology at [facility] [address]: EF's have been "stable" per cardio office note 12/29/10.  His echo 12/2012 showed normal LV size and wall thickness but EF 45-50% and global LV hypokinesis was found.  . Myotonic dystrophy ([place]) Dx'd 1996   Progressive muscle weakness and swallowing dysfunction.  Initial dx by Dr. Jannifer Franklin, neurologist, but subsequent annual specialist f/u has been with Dr. Ladon Applebaum [facility].  Progressive debilitation--PT referral 04/2017, pt quit PT 05/2017.  . Nephrolithiasis   . Obesity, Class I, BMI 30-34.9   . OSA (obstructive sleep apnea)    does not wear CPAP  . Pulmonary nodule, right 05/2017   RUL, 7 mm sub solid nodule.  Resolution noted on f/u CT 08/2017.   Past Surgical History:  Past Surgical History:  Procedure Laterality Date  . COLONOSCOPY  09/13/2007   normal (Dr. Deatra Ina).  . EXTRACORPOREAL SHOCK WAVE LITHOTRIPSY    . THYROIDECTOMY  08/2009   for medullary thyroid cancer (Dr. Conley Canal at [facility] follows him for this).  . TRANSTHORACIC ECHOCARDIOGRAM  12/2012; 09/2017   2014-Normal LV size and wall thickness, LV EF 45-50%, with global hypokinesis of LV.  2018- EF 50-55%, normal wall motion, grd I DD, valves normal.    Assessment & Plan Clinical Impression: Patient is  a 57 y.o. year old male with history of myotonic dystrophy with progressive weakness,dysphagia and expressive deficits, chronic respiratory failure, ADHD, fatty liver; who was admitted from home with generalized weakness, multiple falls with difficulty getting off the floor as well as confusion. UDS was negative. He was noted to be dehydrated and treated with  fluid boluses with improvement in mentation. Work-up negative for infection or acute changes and encephalopathy felt to be due to AKI due to dehydration.Therapy evaluations done revealing cognitive deficits with poor safety awareness as well as deficits in mobility and ability to carry out ADLs.CIR recommended for follow-up therapy. Patient transferred to CIR on 11/02/2018 .    Patient currently requires min with basic self-care skills secondary to muscle weakness, decreased safety awareness and decreased standing balance and decreased balance strategies.  Prior to hospitalization, patient could complete basic ADLs with independent .  Patient will benefit from skilled intervention to decrease level of assist with basic self-care skills and increase independence with basic self-care skills prior to discharge home with care partner.  Anticipate patient will require 24 hour supervision and follow up therapies TBD.  OT - End of Session Activity Tolerance: Tolerates 10 - 20 min activity with multiple rests Endurance Deficit: Yes OT Assessment Rehab Potential (ACUTE ONLY): Good OT Patient demonstrates impairments in the following area(s): Balance;Endurance;Motor;Safety;Cognition OT Basic ADL's Functional Problem(s): Grooming;Bathing;Dressing;Toileting;Eating OT Transfers Functional Problem(s): Toilet;Tub/Shower OT Additional Impairment(s): Fuctional Use of Upper Extremity OT Plan OT Intensity: Minimum of 1-2 x/day, 45 to 90 minutes OT Frequency: 5 out of 7 days OT Duration/Estimated Length of Stay: 5-7 days OT Treatment/Interventions: Balance/vestibular training;Cognitive remediation/compensation;Community reintegration;Discharge planning;DME/adaptive equipment instruction;Functional mobility training;Therapeutic Activities;UE/LE Strength taining/ROM;UE/LE Coordination activities;Therapeutic Exercise;Self Care/advanced ADL retraining;Patient/family education;Neuromuscular re-education;Disease  mangement/prevention OT Self Feeding Anticipated Outcome(s): mod I OT Basic Self-Care Anticipated Outcome(s): supervision OT Toileting Anticipated Outcome(s): supervision OT Bathroom Transfers Anticipated Outcome(s): supervision OT Recommendation Patient destination: Home Follow Up Recommendations: Other (comment)(TBD) Equipment Recommended: To be determined   Skilled Therapeutic Intervention OT eval completed with explanation of CIR process, OT purpose, and overall POC. OT treatment session completed with focus on ADL retraining. Pt completing bathing/dressing ADLs seated in w/c at sink. Pt requiring close supervision for all ADL as he is mildly impulsive during task completion. Pt requiring close supervision for UB ADL, minA for LB ADL and requiring increased time/effort to perform. Pt also requiring intermittent cues for safety during LB dressing. Pt ambulated from w/c to toilet with overall minA, when performing clothing management prior to toileting pt with LOB posteriorly requiring minA to correct. Pt voiding bladder while seated on toilet, requiring safety cues and minA for clothing management after task, and returned to wheelchair in manner described above. Transported pt to therapy gym via w/c for energy conservation, pt performing stand pivot w/c<>therapy mat with minA. Seated on mat additional focus on UE assessment as pt noted to have decreased fine motor/coordination during functional tasks. Pt returned to room in manner described above where he was left seated in w/c with seat belt alarm set, call bell and needs within reach.   OT Evaluation Precautions/Restrictions  Precautions Precautions: Fall Restrictions Weight Bearing Restrictions: No General Chart Reviewed: Yes Family/Caregiver Present: No Vital Signs   Pain Pain Assessment Pain Scale: 0-10 Pain Score: 0-No pain Home Living/Prior Functioning Home Living Family/patient expects to be discharged to:: Private  residence Living Arrangements: Spouse/significant other Available Help at Discharge: Family, Available 24 hours/day Type of Home: House Home Access: Stairs to enter [employer] of  Steps: 5 Entrance Stairs-Rails: Right, Left Home Layout: Two level, Bed/bath upstairs(primarily lives on first floor, goes to second floor to shower 1x/week) Alternate Level Stairs-Number of Steps: flight Alternate Level Stairs-Rails: Right, Left Bathroom Shower/Tub: Chiropodist: Standard  Lives With: Spouse, Family IADL History Homemaking Responsibilities: No Current License: No Occupation: On disability Leisure and Hobbies: watching sports, comedy  Prior Function Level of Independence: Independent with basic ADLs, Independent with transfers, Independent with homemaking with ambulation, Independent with gait  Able to Take Stairs?: Yes Driving: No Vocation: On disability Comments: Pt independent at home w/o AD, but had been falling up to mulitple times per day 2/2 LE weakness and shakiness w/ fatigue ADL ADL Grooming: Minimal assistance Where Assessed-Grooming: Standing at sink Upper Body Bathing: Supervision/safety Where Assessed-Upper Body Bathing: Wheelchair Lower Body Bathing: Minimal assistance Where Assessed-Lower Body Bathing: Wheelchair Upper Body Dressing: Supervision/safety Where Assessed-Upper Body Dressing: Wheelchair Lower Body Dressing: Minimal assistance Where Assessed-Lower Body Dressing: Wheelchair Toileting: Minimal assistance Where Assessed-Toileting: Glass blower/designer: Psychiatric nurse Method: Counselling psychologist: Grab bars Vision Baseline Vision/History: Wears glasses Wears Glasses: Reading only Patient Visual Report: No change from baseline Vision Assessment?: No apparent visual deficits Perception  Perception: Within Functional Limits Praxis Praxis: Impaired Praxis Impairment Details: Motor  planning Cognition Overall Cognitive Status: History of cognitive impairments - at baseline Arousal/Alertness: Awake/alert Orientation Level: Person;Place;Situation Person: Oriented Place: Oriented Situation: Oriented Year: 2019 Month: November Day of Week: Correct Memory: Impaired Memory Impairment: Decreased recall of new information;Decreased short term memory Decreased Short Term Memory: Verbal basic;Functional basic Immediate Memory Recall: Sock;Blue;Bed Memory Recall: Sock;Blue Memory Recall Sock: Without Cue Memory Recall Blue: Without Cue Attention: Sustained;Selective Sustained Attention: Appears intact Selective Attention: Impaired Selective Attention Impairment: Verbal basic;Functional basic Awareness: Impaired Awareness Impairment: Emergent impairment Problem Solving: Impaired Problem Solving Impairment: Verbal basic;Functional basic Behaviors: Impulsive Safety/Judgment: Impaired Comments: Mildly impulsive Sensation Sensation Light Touch: Appears Intact Coordination Gross Motor Movements are Fluid and Coordinated: No Fine Motor Movements are Fluid and Coordinated: No Coordination and Movement Description: Decreased coordination and some shakiness w/ functional movements; decreased fine motor in bil hands at baseline  Finger Nose Finger Test: increased effort and shakiness noted Motor  Motor Motor: Ataxia Motor - Skilled Clinical Observations: Mild ataxia, generalized weakness Mobility  Transfers Sit to Stand: Contact Guard/Touching assist Stand to Sit: Contact Guard/Touching assist  Trunk/Postural Assessment  Cervical Assessment Cervical Assessment: Within Functional Limits Thoracic Assessment Thoracic Assessment: Within Functional Limits Lumbar Assessment Lumbar Assessment: Within Functional Limits Postural Control Postural Control: Deficits on evaluation Righting Reactions: delayed  Balance Balance Balance Assessed: Yes Static Sitting  Balance Static Sitting - Balance Support: Feet supported;No upper extremity supported Static Sitting - Level of Assistance: 5: Stand by assistance Dynamic Sitting Balance Dynamic Sitting - Balance Support: No upper extremity supported;Feet supported Dynamic Sitting - Level of Assistance: 4: Min assist Static Standing Balance Static Standing - Balance Support: No upper extremity supported;During functional activity Static Standing - Level of Assistance: 4: Min assist Dynamic Standing Balance Dynamic Standing - Balance Support: No upper extremity supported;During functional activity Dynamic Standing - Level of Assistance: 4: Min assist Extremity/Trunk Assessment RUE Assessment RUE Assessment: Exceptions to [facility] Active Range of Motion (AROM) Comments: limited to grossly 0-125*; limited digit flexion at baseline due to myotonic dystrophy General Strength Comments: generalized weakness, decreased grip strength LUE Assessment LUE Assessment: Exceptions to [facility] Active Range of Motion (AROM) Comments: limited to grossly 0-150*, limited digit flexion at baseline due  to myotonic dystrophy General Strength Comments: generalized weakness, decreased grip strength     Refer to Care Plan for Long Term Goals  Recommendations for other services: None    Discharge Criteria: Patient will be discharged from OT if patient refuses treatment 3 consecutive times without medical reason, if treatment goals not met, if there is a change in medical status, if patient makes no progress towards goals or if patient is discharged from hospital.  The above assessment, treatment plan, treatment alternatives and goals were discussed and mutually agreed upon: by patient  Raymondo Band 11/03/2018, 4:07 PM

## 2018-11-04 NOTE — Progress Notes (Signed)
MEDICATION RELATED CONSULT NOTE - FOLLOW UP   Pharmacy Consult for Medication Review  Indication: Mild Leukopenia   Assessment:  -Review of current inpatient medications doesn't reveal any medications that would be considered as carrying a significant risk of leukopenia  Narda Bonds, PharmD, BCPS Clinical Pharmacist Phone: [phone]  Medications:   Current Facility-Administered Medications:  .  acetaminophen (TYLENOL) tablet 325-650 mg, 325-650 mg, Oral, Q4H PRN, Love, Pamela S, PA-C .  alum & mag hydroxide-simeth (MAALOX/MYLANTA) 200-200-20 MG/5ML suspension 30 mL, 30 mL, Oral, Q4H PRN, Love, Pamela S, PA-C .  amphetamine-dextroamphetamine (ADDERALL) tablet 20 mg, 20 mg, Oral, BID, Love, Pamela S, PA-C, 20 mg at 11/04/18 0549 .  bisacodyl (DULCOLAX) suppository 10 mg, 10 mg, Rectal, Daily PRN, Love, Pamela S, PA-C .  diphenhydrAMINE (BENADRYL) 12.5 MG/5ML elixir 12.5-25 mg, 12.5-25 mg, Oral, Q6H PRN, Love, Pamela S, PA-C .  enoxaparin (LOVENOX) injection 40 mg, 40 mg, Subcutaneous, Q24H, Love, Pamela S, PA-C, 40 mg at 11/03/18 1815 .  FLUoxetine (PROZAC) capsule 20 mg, 20 mg, Oral, Daily, Love, Pamela S, PA-C, 20 mg at 11/03/18 0921 .  guaiFENesin-dextromethorphan (ROBITUSSIN DM) 100-10 MG/5ML syrup 5-10 mL, 5-10 mL, Oral, Q6H PRN, Love, Pamela S, PA-C .  levothyroxine (SYNTHROID, LEVOTHROID) tablet 150 mcg, 150 mcg, Oral, Once per day on Sun Mon Tue Thu Sat, Love, Pamela S, PA-C, 150 mcg at 11/04/18 0549 .  polyethylene glycol (MIRALAX / GLYCOLAX) packet 17 g, 17 g, Oral, Daily PRN, Bary Leriche, PA-C, 17 g at 11/03/18 2044 .  prochlorperazine (COMPAZINE) tablet 5-10 mg, 5-10 mg, Oral, Q6H PRN **OR** prochlorperazine (COMPAZINE) injection 5-10 mg, 5-10 mg, Intramuscular, Q6H PRN **OR** prochlorperazine (COMPAZINE) suppository 12.5 mg, 12.5 mg, Rectal, Q6H PRN, Love, Pamela S, PA-C .  sodium phosphate ([name]) 7-19 GM/118ML enema 1 enema, 1 enema, Rectal, Once PRN, Love, Pamela S,  PA-C .  traZODone (DESYREL) tablet 25-50 mg, 25-50 mg, Oral, QHS PRN, Love, Pamela S, PA-C, 50 mg at 11/03/18 2044

## 2018-11-04 NOTE — Progress Notes (Signed)
[facility] PHYSICAL MEDICINE & REHABILITATION PROGRESS NOTE   Subjective/Complaints:  No issues overnite, discussed deconditioning as likely cause of falls, discussed labwork  ROS- neg CP, SOB, N/V/D  Objective:   No results found. Recent Labs    11/03/18 0551  WBC 3.3*  HGB 13.9  HCT 43.8  PLT 163   Recent Labs    11/03/18 0551  NA 138  K 4.1  CL 101  CO2 27  GLUCOSE 81  BUN 8  CREATININE 1.02  CALCIUM 9.4    Intake/Output Summary (Last 24 hours) at 11/04/2018 0711 Last data filed at 11/04/2018 0552 Gross per 24 hour  Intake 1080 ml  Output 300 ml  Net 780 ml     Physical Exam: Vital Signs Blood pressure 112/73, pulse 97, temperature 97.9 F (36.6 C), temperature source Oral, resp. rate 16, height 5\' 5"  (1.651 m), weight 92.3 kg, SpO2 97 %.   General: No acute distress Mood and affect are appropriate Heart: Regular rate and rhythm no rubs murmurs or extra sounds Lungs: Clear to auscultation, breathing unlabored, no rales or wheezes Abdomen: Positive bowel sounds, soft nontender to palpation, nondistended Extremities: No clubbing, cyanosis, or edema Skin: No evidence of breakdown, no evidence of rash Neurologic: Cranial nerves II through XII intact, motor strength is 5/5 in bilateral deltoid, bicep, tricep, grip, hip flexor, knee extensors, ankle dorsiflexor and plantar flexor Sensory exam normal sensation to light touch and proprioception in bilateral upper and lower extremities Speech with mild dysarthria Ext - C/C/E   Assessment/Plan: 1. Functional deficits secondary to Myotonic dystrophy with falls which require 3+ hours per day of interdisciplinary therapy in a comprehensive inpatient rehab setting.  Physiatrist is providing close team supervision and 24 hour management of active medical problems listed below.  Physiatrist and rehab team continue to assess barriers to discharge/monitor patient progress toward functional and medical goals  Care  Tool:  Bathing    Body parts bathed by patient: Right arm, Left arm, Chest, Abdomen, Right upper leg, Buttocks, Front perineal area, Left upper leg, Right lower leg, Left lower leg, Face         Bathing assist Assist Level: Minimal Assistance - Patient > 75%(seated in w/c at sink )     Upper Body Dressing/Undressing Upper body dressing   What is the patient wearing?: Pull over shirt    Upper body assist Assist Level: Supervision/Verbal cueing    Lower Body Dressing/Undressing Lower body dressing      What is the patient wearing?: Underwear/pull up, Pants     Lower body assist Assist for lower body dressing: Minimal Assistance - Patient > 75%     Toileting Toileting    Toileting assist Assist for toileting: Moderate Assistance - Patient 50 - 74%     Transfers Chair/bed transfer  Transfers assist     Chair/bed transfer assist level: Contact Guard/Touching assist     Locomotion Ambulation   Ambulation assist      Assist level: Contact Guard/Touching assist Assistive device: Other (comment)(none) Max distance: 50'   Walk 10 feet activity   Assist     Assist level: Contact Guard/Touching assist Assistive device: Other (comment)(none)   Walk 50 feet activity   Assist    Assist level: Contact Guard/Touching assist      Walk 150 feet activity   Assist Walk 150 feet activity did not occur: Safety/medical concerns         Walk 10 feet on uneven surface  activity  Assist Walk 10 feet on uneven surfaces activity did not occur: Safety/medical concerns         Wheelchair     Assist Will patient use wheelchair at discharge?: No Type of Wheelchair: Manual    Wheelchair assist level: Contact Guard/Touching assist Max wheelchair distance: 56'    Wheelchair 50 feet with 2 turns activity    Assist        Assist Level: Contact Guard/Touching assist   Wheelchair 150 feet activity     Assist Wheelchair 150 feet  activity did not occur: Safety/medical concerns(fatigue)        Medical Problem List and Plan: 1.Debility and encephalopathysecondary to AKI/dehydration in a 57 yo male with myotonic dystrophy PT, OT,SLP evals today 2. DVT Prophylaxis/Anticoagulation: Pharmaceutical:Lovenox 3. Pain Management:Tylenol as needed 4. Mood:LCSW to follow for evaluation and support 5. Neuropsych: This patientis not fullycapable of making decisions on hisown behalf. 6. Skin/Wound Care:Routine pressure relief measures 7. Fluids/Electrolytes/Nutrition:Monitor I's and O's. BMET normal 11/9 8.Myotonic dystrophy: With reports of progression. Respiratory status stable 9.ADHD: Continue Adderall 3 times daily 10.Thrombocytopenia: resolved on follow up CBC 11/9 plt normal ,now mildly leukopenic, ANC   afebrile cont to monitor, ask for pharmacy consult re : possible offending meds LOS: 2 days A FACE TO FACE EVALUATION WAS PERFORMED  Charlett Blake 11/04/2018, 7:11 AM

## 2018-11-05 ENCOUNTER — Inpatient Hospital Stay (HOSPITAL_COMMUNITY): Payer: 59

## 2018-11-05 ENCOUNTER — Inpatient Hospital Stay (HOSPITAL_COMMUNITY): Payer: 59 | Admitting: Occupational Therapy

## 2018-11-05 ENCOUNTER — Inpatient Hospital Stay (HOSPITAL_COMMUNITY): Payer: 59 | Admitting: Speech Pathology

## 2018-11-05 ENCOUNTER — Inpatient Hospital Stay (HOSPITAL_COMMUNITY): Payer: 59 | Admitting: Physical Therapy

## 2018-11-05 DIAGNOSIS — R5381 Other malaise: Secondary | ICD-10-CM

## 2018-11-05 DIAGNOSIS — D72819 Decreased white blood cell count, unspecified: Secondary | ICD-10-CM

## 2018-11-05 DIAGNOSIS — R918 Other nonspecific abnormal finding of lung field: Secondary | ICD-10-CM

## 2018-11-05 LAB — CULTURE, BLOOD (ROUTINE X 2)
CULTURE: NO GROWTH
Culture: NO GROWTH

## 2018-11-05 NOTE — Progress Notes (Signed)
Patient information reviewed and entered into eRehab system by Berkleigh Beckles, RN, CRRN, PPS Coordinator.  Information including medical coding, functional ability and quality indicators will be reviewed and updated through discharge.    

## 2018-11-05 NOTE — Care Management Note (Signed)
[facility] Individual Statement of Services  Patient Name:  [name]  Date:  11/05/2018  Welcome to the [facility].  Our goal is to provide you with an individualized program based on your diagnosis and situation, designed to meet your specific needs.  With this comprehensive rehabilitation program, you will be expected to participate in at least 3 hours of rehabilitation therapies Monday-Friday, with modified therapy programming on the weekends.  Your rehabilitation program will include the following services:  Physical Therapy (PT), Occupational Therapy (OT), Speech Therapy (ST), 24 hour per day rehabilitation nursing, Therapeutic Recreaction (TR), Neuropsychology, Case Management (Social Worker), Rehabilitation Medicine, Nutrition Services and Pharmacy Services  Weekly team conferences will be held on Wednesday to discuss your progress.  Your Social Worker will talk with you frequently to get your input and to update you on team discussions.  Team conferences with you and your family in attendance may also be held.  Expected length of stay: 5-7 days  Overall anticipated outcome: supervision with cueing  Depending on your progress and recovery, your program may change. Your Social Worker will coordinate services and will keep you informed of any changes. Your Social Worker's name and contact numbers are listed  below.  The following services may also be recommended but are not provided by the [facility]:    [facility] will be made to provide these services after discharge if needed.  Arrangements include referral to agencies that provide these services.  Your insurance has been verified to be:  [employer] Your primary doctor is:  Ricardo Jericho  Pertinent information will be shared with your doctor and your insurance company.  Social Worker:   Ovidio Kin, [place] or ([phone]  Information discussed with and copy given to patient by: Elease Hashimoto, 11/05/2018, 11:10 AM

## 2018-11-05 NOTE — Progress Notes (Signed)
Retta Diones, RN  Rehab Admission Coordinator  Physical Medicine and Rehabilitation  PMR Pre-admission  Signed  Date of Service:  11/02/2018 1:19 PM       Related encounter: ED to Hosp-Admission (Discharged) from 10/30/2018 in [facility]      Signed         Show:Clear all [x]Manual[x]Template[x]Copied  Added by: [x]Sahej Hauswirth, Evalee Mutton, RN  []Hover for details PMR Admission Coordinator Pre-Admission Assessment  Patient: [name] is an 57 y.o., male MRN: [id number] DOB: 02/13/1961 Height: 5' 5" (165.1 cm) Weight: 86.2 kg                                                                                                                                                  Insurance Information HMO:     PPO:       PCP:       IPA:       80/20:       OTHER:  [facility] [place] PRIMARY: [facility]      Policy#: [id number]      Subscriber: [name] CM Name: [name]      Phone#: [phone]     Fax#: [phone] Pre-Cert#: [id number] from 11/08 to 11/09/18 with updates due on 11/08/18      Employer: [employer] - wife works FT Benefits:  Phone #: [phone]     Name:  Online portal Eff. Date: 10/26/13     Deduct: $300 (met $300)      Out of Pocket Max: $7900 (met $610.12)      Life Max: N/A CIR: $500 per admission and then 80% at [facility]      SNF: 80% with [facility]; 60% with [facility] facility Outpatient: medical necessity     Co-Pay: $20 at [facility]; $40 with [facility]: 80%      Co-Pay: 20% DME: 80%     Co-Pay: 20% Providers: in network with [facility] or [employer] network  Medicaid Application Date:        Case Manager:   Disability Application Date:        Case Worker:    Emergency Publishing copy Information    Name Relation Home Work Elaine Spouse [phone]  [phone]   MARINA, DESIRE   [phone]     Current Medical History  Patient Admitting Diagnosis:  Decline in functional mobility and ADL skills following multiple falls, question progression of myotonic dystrophy    History of Present Illness: A 57 y.o.malewith history of myotonic dystrophy with progressive weaknesswith dysphagiaandexpressivedeficits, chronic respiratory failure, untreated OSA,ADHD, fatty liver;who was admitted from home with generalized weakness, multiplefalls with difficulty getting off the floorand confusion x1 day. UDS negative.He was noted to be dehydrated and  was treated with fluid bolus with improvement in mentation. CT head and cervical spine negative for acute changes of fracture.Chest x-ray and UA negative for infection. Ammonia levels within normal limits.PT evaluation done revealing confused mumbling speech, poor judgment, tachycardia with activity as well as cognitive deficits. CIR recommended due to functional deficits  Patient sedentary with daytime fatigue and shortness of breath but reported to be independent prior to admission.He stays/sleeps in the living on the couch and goes up to the shower about once a week.Wife works in Scientist, research (medical) at [employer] from home few days a week.Son (unemployed at this time)can provide supervision shile wife is at work.  Complete NIHSS TOTAL: 3  Past Medical History      Past Medical History:  Diagnosis Date  . Adult ADHD   . Aspiration pneumonitis ([place]) 04/2017, 08/2017   Recurrent  . Chronic respiratory failure ([place])    As of 04/2018, Dr. Chase Caller suspects hypercapnic RF--but [name] normal.  May eventually need nocturnal noninvasive ventilation.  Ultimately, if progresses enough pt will need trach.  . Constipation, chronic    Has caused irritative urinary/bladder symptoms in the past.  . Depression   . Dizziness 06/2011+   Noncontrast CT and noncontrast MRI both negative 06/2011 at [facility] ED visit.  . Elevated transaminase level    Liver biopsy revealed fatty liver  . Erectile  dysfunction   . Facial fracture ([place]) 06/2011   Nondisplaced bilateral [place] I fracture: no surgery required. (ENT, Dr. Wilburn Cornelia)   . Fatty liver disease, nonalcoholic   . GERD (gastroesophageal reflux disease)   . History of thyroid cancer 2010   Medullary carcinoma of thyroid. Thyroidectomy 08/2009 (Dr. Aida Puffer at [facility]).  Needs periodic serum calcitonin monitoring.  Marland Kitchen Hyperlipidemia   . Hypothyroidism   . LVH (left ventricular hypertrophy)    by EKG criteria 06/2011.  Hx of heart murmur--Followed by cardiology at [facility]: EF's have been "stable" per cardio office note 12/29/10.  His echo 12/2012 showed normal LV size and wall thickness but EF 45-50% and global LV hypokinesis was found.  . Myotonic dystrophy ([place]) Dx'd 1996   Progressive muscle weakness and swallowing dysfunction.  Initial dx by Dr. Jannifer Franklin, neurologist, but subsequent annual specialist f/u has been with Dr. Ladon Applebaum [facility].  Progressive debilitation--PT referral 04/2017, pt quit PT 05/2017.  . Nephrolithiasis   . Obesity, Class I, BMI 30-34.9   . OSA (obstructive sleep apnea)    does not wear CPAP  . Pulmonary nodule, right 05/2017   RUL, 7 mm sub solid nodule.  Resolution noted on f/u CT 08/2017.    Family History  family history includes Alcohol abuse in his father; Arthritis in his father and mother; Colon cancer in his father; Hypertension in his father and mother; Prostate cancer in his father.  Prior Rehab/Hospitalizations: Had outpatient PT after bout of PNA about 1 year ago  Has the patient had major surgery during 100 days prior to admission? No  Current Medications   Current Facility-Administered Medications:  .  0.9 %  sodium chloride infusion, , Intravenous, Continuous, Emokpae, Courage, MD, Last Rate: 50 mL/hr at 11/01/18 1852 .  acetaminophen (TYLENOL) tablet 650 mg, 650 mg, Oral, Q6H PRN, 650 mg at 11/01/18 2202 **OR** acetaminophen (TYLENOL) suppository 650 mg, 650 mg, Rectal, Q6H  PRN, Ivor Costa, MD .  amphetamine-dextroamphetamine (ADDERALL) tablet 20 mg, 20 mg, Oral, BID, Ivor Costa, MD, 20 mg at 11/02/18 2902 .  enoxaparin (LOVENOX) injection 40 mg, 40 mg, Subcutaneous,  Q24H, Ivor Costa, MD, 40 mg at 11/01/18 1651 .  FLUoxetine (PROZAC) capsule 20 mg, 20 mg, Oral, Daily, Ivor Costa, MD, 20 mg at 11/02/18 0828 .  levothyroxine (SYNTHROID, LEVOTHROID) tablet 150 mcg, 150 mcg, Oral, Once per day on Sun Mon Tue Thu Sat, Blaine Hamper, Soledad Gerlach, MD, 150 mcg at 11/01/18 0559 .  ondansetron (ZOFRAN) injection 4 mg, 4 mg, Intravenous, Q8H PRN, Ivor Costa, MD .  senna-docusate (Senokot-S) tablet 1 tablet, 1 tablet, Oral, QHS PRN, Ivor Costa, MD .  traZODone (DESYREL) tablet 100 mg, 100 mg, Oral, QHS PRN, Ivor Costa, MD, 100 mg at 11/01/18 2351 .  zolpidem (AMBIEN) tablet 5 mg, 5 mg, Oral, QHS PRN, Ivor Costa, MD  Patients Current Diet:     Diet Order                  Diet regular Room service appropriate? Yes; Fluid consistency: Thin  Diet effective now               Precautions / Restrictions Precautions Precautions: Fall Restrictions Weight Bearing Restrictions: No   Has the patient had 2 or more falls or a fall with injury in the past year?Yes.  Wife reports at least 10 falls this past year with no injury.  Prior Activity Level Household: Mostly homebound, went out 1 X every 2 weeks.  Home Assistive Devices / Equipment Home Assistive Devices/Equipment: None Home Equipment: Other (comment), Shower seat(has access to a walker but unsure if it is appropriate)  Prior Device Use: Indicate devices/aids used by the patient prior to current illness, exacerbation or injury? Has a rollator that he has used only occasionally  Prior Functional Level Prior Function Level of Independence: Independent Comments: pt reports was ambulating without AD and completing ADLs without assist  Self Care: Did the patient need help bathing, dressing, using the toilet or  eating?  Needed some help.  Wife provided some assistance when patient showered in the upstairs bathroom  Indoor Mobility: Did the patient need assistance with walking from room to room (with or without device)? Independent  Stairs: Did the patient need assistance with internal or external stairs (with or without device)? Independent.  Needed to use rails on the stairs.  Functional Cognition: Did the patient need help planning regular tasks such as shopping or remembering to take medications? Needed some help.  Wife does all shopping.  Wife pours medications up each day and then patient can self administer medications.  Current Functional Level Cognition  Overall Cognitive Status: Impaired/Different from baseline Current Attention Level: Selective Orientation Level: Oriented to place, Oriented to situation, Oriented to person Following Commands: Follows one step commands with increased time, Follows one step commands inconsistently Safety/Judgement: Decreased awareness of safety General Comments: pt impulsive during mobility and completion of functional tasks requiring increased cues for safety; requires increased time and multimodal cues to sequence/follow one step commands. Pt with difficulty completing dual task, when pt responding to questions while walking noted pt often having to stop and stand still prior to responding    Extremity Assessment (includes Sensation/Coordination)  Upper Extremity Assessment: Generalized weakness  Lower Extremity Assessment: Generalized weakness    ADLs  Overall ADL's : Needs assistance/impaired Eating/Feeding: Set up, Sitting Grooming: Set up, Min guard, Sitting Upper Body Bathing: Minimal assistance, Sitting Lower Body Bathing: Moderate assistance, +2 for physical assistance, +2 for safety/equipment, Sit to/from stand Upper Body Dressing : Minimal assistance, Sitting Lower Body Dressing: Moderate assistance, +2 for physical assistance, +2  for  safety/equipment, Sit to/from stand Lower Body Dressing Details (indicate cue type and reason): pt able to don L sock, able to perform figure 4 technique with RLE though unable to maintain while donning sock, requires assist to complete Toilet Transfer: Minimal assistance, +2 for physical assistance, +2 for safety/equipment, RW, BSC Toilet Transfer Details (indicate cue type and reason): BSC over toilet; due to limited space in bathroom had to transition from RW with +2 assist to HHA+2 Toileting- Clothing Manipulation and Hygiene: Minimal assistance, Sit to/from stand, +2 for physical assistance, +2 for safety/equipment Toileting - Clothing Manipulation Details (indicate cue type and reason): assist for gown/brief management Functional mobility during ADLs: Minimal assistance, +2 for physical assistance, +2 for safety/equipment, Rolling walker    Mobility  Overal bed mobility: Needs Assistance Bed Mobility: Supine to Sit Supine to sit: Min guard General bed mobility comments: increased time and effort, min guard for safety    Transfers  Overall transfer level: Needs assistance Equipment used: Rolling walker (2 wheeled) Transfers: Sit to/from Stand Sit to Stand: Min assist General transfer comment: increased time and effort, multimodal cueing for safety and technique, assist to power into standing from EOB    Ambulation / Gait / Stairs / Wheelchair Mobility  Ambulation/Gait Ambulation/Gait assistance: Herbalist (Feet): 10 Feet Assistive device: Rolling walker (2 wheeled) Gait Pattern/deviations: Step-to pattern, Step-through pattern, Decreased step length - right, Decreased step length - left, Decreased stride length, Shuffle General Gait Details: pt with increased fatigue and decreased tolerance for ambulation, very limited with distance. Pt also with reported pain in L LE and requesting to sit down Gait velocity: decreased    Posture / Balance Balance Overall  balance assessment: Needs assistance Sitting-balance support: Feet supported Sitting balance-Leahy Scale: Good Standing balance support: During functional activity, Bilateral upper extremity supported Standing balance-Leahy Scale: Poor    Special needs/care consideration BiPAP/CPAP No CPM No Continuous Drip IV 0.9% NS 50 mL/hr Dialysis No       Life Vest No Oxygen No Special Bed No Trach Size No Wound Vac (area) No    Skin No                         Bowel mgmt:Last BM 11/01/18 per patient and wife Bladder mgmt:Condom catheter in place Diabetic mgmt No    Previous Home Environment Living Arrangements: Spouse/significant other Available Help at Discharge: Family, Available 24 hours/day Type of Home: House Home Layout: Two level, Bed/bath upstairs Alternate Level Stairs-Rails: Right, Left Alternate Level Stairs-Number of Steps: flight Home Access: Stairs to enter Entrance Stairs-Rails: Right, Left Entrance Stairs-Number of Steps: 5 Bathroom Shower/Tub: Tub/shower unit Home Care Services: No  Discharge Living Setting Plans for Discharge Living Setting: Patient's home, House, Lives with (comment)(Lives with wife and adult son.) Type of Home at Discharge: House Discharge Home Layout: Two level, 1/2 bath on main level, Bed/bath upstairs(Has been sleeping on the couch on the main level.) Alternate Level Stairs-Number of Steps: 12-13 steps to 2nd level Discharge Home Access: Stairs to enter Entrance Stairs-Rails: Right, Left, Can reach both Entrance Stairs-Number of Steps: 4-5 steps Discharge Bathroom Shower/Tub: Tub/shower unit, Curtain Discharge Bathroom Toilet: Handicapped height Discharge Bathroom Accessibility: Yes How Accessible: Accessible via walker Does the patient have any problems obtaining your medications?: No  Social/Family/Support Systems Patient Roles: Spouse, Parent(Has a wife and an adult son.) Contact Information: [name] - wife Anticipated  Caregiver: wife and son Anticipated Caregiver's Contact Information: [name] - wife -  [phone] Ability/Limitations of Caregiver: Wife works from home and can assist.  Son is not currently working and can assist. Careers adviser: 24/7 Discharge Plan Discussed with Primary Caregiver: Yes Is Caregiver In Agreement with Plan?: Yes Does Caregiver/Family have Issues with Lodging/Transportation while Pt is in Rehab?: No  Goals/Additional Needs Patient/Family Goal for Rehab: PT/OT/SLP mod I and supervision goals Expected length of stay: 8-12 days Cultural Considerations: Presbyterian Dietary Needs: Regular diet, thin liquids Equipment Needs: TBD Pt/Family Agrees to Admission and willing to participate: Yes(Met with wife and patient.) Program Orientation Provided & Reviewed with Pt/Caregiver Including Roles  & Responsibilities: Yes(Spoke with wife and with patient.)  Decrease burden of Care through IP rehab admission: N/A  Possible need for SNF placement upon discharge: Not anticipated  Patient Condition: This patient's condition remains as documented in the consult dated 11/01/18, in which the Rehabilitation Physician determined and documented that the patient's condition is appropriate for intensive rehabilitative care in an inpatient rehabilitation facility. Will admit to inpatient rehab today.  Preadmission Screen Completed By:  Retta Diones, 11/02/2018 2:38 PM ______________________________________________________________________   Discussed status with Dr.  Naaman Plummer on 11/02/18 at 1439 and received telephone approval for admission today.  Admission Coordinator:  Retta Diones, time 1439/Date 11/02/18           Cosigned by: Meredith Staggers, MD at 11/02/2018 3:06 PM  Revision History

## 2018-11-05 NOTE — IPOC Note (Signed)
Overall Plan of Care [facility] Patient Details Name: [name] MRN: [id number] DOB: [birth year]  Admitting Diagnosis: Encephalopathy  Hospital Problems: Active Problems:   Encephalopathy     Functional Problem List: Nursing Behavior, Bladder, Bowel, Endurance, Motor, Safety  PT Balance, Behavior, Endurance, Motor, Safety  OT Balance, Endurance, Motor, Safety, Cognition  SLP    TR         Basic ADL's: OT Grooming, Bathing, Dressing, Toileting, Eating     Advanced  ADL's: OT       Transfers: PT Bed Mobility, Bed to Chair, Furniture, Floor, Teacher, early years/pre, Metallurgist: PT Ambulation, Stairs     Additional Impairments: OT Fuctional Use of Upper Extremity  SLP Communication expression    TR      Anticipated Outcomes Item Anticipated Outcome  Self Feeding mod I  Swallowing      Basic self-care  supervision  Toileting  supervision   Bathroom Transfers supervision  Bowel/Bladder  bowel and bladder with mod I assist  Transfers  supervision  Locomotion  supervision household mobility   Communication  Supervision  Cognition     Pain  pain <2  Safety/Judgment  maintain safety with mod I assist   Therapy Plan: PT Intensity: Minimum of 1-2 x/day ,45 to 90 minutes PT Frequency: 5 out of 7 days PT Duration Estimated Length of Stay: 5-7 days OT Intensity: Minimum of 1-2 x/day, 45 to 90 minutes OT Frequency: 5 out of 7 days OT Duration/Estimated Length of Stay: 5-7 days SLP Intensity: Minumum of 1-2 x/day, 30 to 90 minutes SLP Frequency: 3 to 5 out of 7 days SLP Duration/Estimated Length of Stay: 5 to 7 days    Team Interventions: Nursing Interventions Patient/Family Education, Discharge Planning, Medication Management, Bladder Management, Bowel Management, Psychosocial Support  PT interventions Ambulation/gait training, Disease management/prevention, Pain management, Stair training, Visual/perceptual remediation/compensation, Wheelchair  propulsion/positioning, Therapeutic Activities, Patient/family education, DME/adaptive equipment instruction, Training and development officer, Cognitive remediation/compensation, Functional electrical stimulation, Psychosocial support, Therapeutic Exercise, UE/LE Strength taining/ROM, Skin care/wound management, Functional mobility training, Community reintegration, Discharge planning, Neuromuscular re-education, Splinting/orthotics, UE/LE Coordination activities  OT Interventions Training and development officer, Cognitive remediation/compensation, Community reintegration, Discharge planning, DME/adaptive equipment instruction, Functional mobility training, Therapeutic Activities, UE/LE Strength taining/ROM, UE/LE Coordination activities, Therapeutic Exercise, Self Care/advanced ADL retraining, Patient/family education, Neuromuscular re-education, Disease mangement/prevention  SLP Interventions Speech/Language facilitation, Patient/family education, Therapeutic Activities  TR Interventions    SW/CM Interventions Discharge Planning, Psychosocial Support, Patient/Family Education   Barriers to Discharge MD  Medical stability  Nursing      PT Medical stability, Home environment access/layout shower on 2nd floor, full flight of stairs to access   OT      SLP      SW       Team Discharge Planning: Destination: PT-Home ,OT- Home , SLP-Home Projected Follow-up: PT-Home health PT, Outpatient PT(TBD, son may be able to drive pt to OPPT), OT-  Other (comment)(TBD), SLP-None Projected Equipment Needs: PT-To be determined, OT- To be determined, SLP-None recommended by SLP Equipment Details: PT- , OT-  Patient/family involved in discharge planning: PT- Patient, Family member/caregiver,  OT-Patient, SLP-Patient, Family member/caregiver  MD ELOS: 4-7 days. Medical Rehab Prognosis:  Excellent Assessment: 57 year old male with history of myotonic dystrophy with progressive weakness,dysphagia and expressive  deficits, chronic respiratory failure, ADHD, fatty liver; who was admitted from home with generalized weakness, multiple falls with difficulty getting off the floor as well as confusion. UDS was negative. He was noted to  be dehydrated and treated with fluid boluses with improvement in mentation. Work-up negative for infection or acute changes and encephalopathy felt to be due to AKI due to dehydration.Therapy evaluations done revealing cognitive deficits with poor safety awareness as well as deficits in mobility and ability to carry out ADLs. Will set goals for Supervision with PT/OT/SLP.   See Team Conference Notes for weekly updates to the plan of care

## 2018-11-05 NOTE — Progress Notes (Signed)
Social Work  Social Work Assessment and Plan  Patient Details  Name: [name] MRN: [id number] Date of Birth: [birth year]  Today's Date: 11/05/2018  Problem List:  Patient Active Problem List   Diagnosis Date Noted  . Abnormal CT scan of lung   . Debility   . Leukopenia   . Encephalopathy 11/02/2018  . Acute metabolic encephalopathy 47/42/5956  . Pulmonary nodule 10/31/2018  . AKI (acute kidney injury) ([place]) 10/31/2018  . Generalized weakness 10/31/2018  . Fall 10/31/2018  . Weakness   . Aspiration pneumonia ([place]) 05/09/2017  . History of fall 05/09/2017  . Viral gastroenteritis 03/13/2014  . Autonomic dysfunction 09/05/2013  . Tachycardia 02/20/2013  . ADD (attention deficit disorder) 08/15/2012  . Depression 08/15/2012  . History of thyroid cancer   . Dizziness, nonspecific 12/07/2011  . Hypothyroidism 12/07/2011  . DEPRESSION 03/20/2008  . Myotonic muscular dystrophy ([place]) 03/20/2008  . GERD 03/20/2008  . SLEEP APNEA 03/20/2008   Past Medical History:  Past Medical History:  Diagnosis Date  . Adult ADHD   . Aspiration pneumonitis ([place]) 04/2017, 08/2017   Recurrent  . Chronic respiratory failure ([place])    As of 04/2018, Dr. Chase Caller suspects hypercapnic RF--but [name] normal.  May eventually need nocturnal noninvasive ventilation.  Ultimately, if progresses enough pt will need trach.  . Constipation, chronic    Has caused irritative urinary/bladder symptoms in the past.  . Depression   . Dizziness 06/2011+   Noncontrast CT and noncontrast MRI both negative 06/2011 at [facility] ED visit.  . Elevated transaminase level    Liver biopsy revealed fatty liver  . Erectile dysfunction   . Facial fracture ([place]) 06/2011   Nondisplaced bilateral LeFort I fracture: no surgery required. (ENT, Dr. Wilburn Cornelia)   . Fatty liver disease, nonalcoholic   . GERD (gastroesophageal reflux disease)   . History of thyroid cancer 2010   Medullary carcinoma of thyroid. Thyroidectomy 08/2009  (Dr. Aida Puffer at [facility]).  Needs periodic serum calcitonin monitoring.  Marland Kitchen Hyperlipidemia   . Hypothyroidism   . LVH (left ventricular hypertrophy)    by EKG criteria 06/2011.  Hx of heart murmur--Followed by cardiology at [facility]: EF's have been "stable" per cardio office note 12/29/10.  His echo 12/2012 showed normal LV size and wall thickness but EF 45-50% and global LV hypokinesis was found.  . Myotonic dystrophy ([place]) Dx'd 1996   Progressive muscle weakness and swallowing dysfunction.  Initial dx by Dr. Jannifer Franklin, neurologist, but subsequent annual specialist f/u has been with Dr. Ladon Applebaum [facility].  Progressive debilitation--PT referral 04/2017, pt quit PT 05/2017.  . Nephrolithiasis   . Obesity, Class I, BMI 30-34.9   . OSA (obstructive sleep apnea)    does not wear CPAP  . Pulmonary nodule, right 05/2017   RUL, 7 mm sub solid nodule.  Resolution noted on f/u CT 08/2017.   Past Surgical History:  Past Surgical History:  Procedure Laterality Date  . COLONOSCOPY  09/13/2007   normal (Dr. Deatra Ina).  . EXTRACORPOREAL SHOCK WAVE LITHOTRIPSY    . THYROIDECTOMY  08/2009   for medullary thyroid cancer (Dr. Conley Canal at [facility] follows him for this).  . TRANSTHORACIC ECHOCARDIOGRAM  12/2012; 09/2017   2014-Normal LV size and wall thickness, LV EF 45-50%, with global hypokinesis of LV.  2018- EF 50-55%, normal wall motion, grd I DD, valves normal.   Social History:  reports that he has never smoked. He has never used smokeless tobacco. He reports that he drinks alcohol. He reports  that he does not use drugs.  Family / Support Systems Marital Status: Married Patient Roles: Spouse, Parent Spouse/Significant OtherIzora Gala [phone] Children: [name]-son [phone] Other Supports: Friends Anticipated Caregiver: Wife and son Ability/Limitations of Caregiver: Wife works from home and at times needs to come into the hospital-son is unemployed and can Veterinary surgeon Availability:  24/7 Family Dynamics: Close knit family who are willing to assist one another and be there. Pt has been slowly declining and falling at home. He is here to get stronger and more stedy on his feet. 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 Wife will be included since he wants her to be   Abuse/Neglect Abuse/Neglect Assessment Can Be Completed: Yes Physical Abuse: Denies Verbal Abuse: Denies Sexual Abuse: Denies Exploitation of patient/patient's resources: Denies Self-Neglect: Denies  Emotional Status Pt's affect, behavior adn adjustment status: Pt is motivated to do well but wants to go home and not be here. He was agreeable at the time but feels it is too much therapy for him. Wife is aware of this and wants him to stay. He wants to work with weights will ask therapy team about this. Recent Psychosocial Issues: his myotonic dystrophy which is a progressive disease Pyschiatric History: No history but will try to have neuro-psych see if here long enough due to feel it would be useful for pt and wife. Will make referral Substance Abuse History: No issues  Patient / Family Perceptions, Expectations & Goals Pt/Family understanding of illness & functional limitations: Pt and wife can explain his illness and talk with the MD daily and feel they have a good understanding of his condition and hope he can get stronger while here. Premorbid pt/family roles/activities: Husband, son, retiree, friend, neighbor, etc Anticipated changes in roles/activities/participation: resume Pt/family expectations/goals: Pt states: " I can do this at home and don't need to be here, it is too  much for me." Wife states: " I want him to stay here and get the benefits from rehab but will keep encouraging him to stay here."  [employer]: Other (Comment)(has OPPT last year) Premorbid Home Care/DME Agencies: Other (Comment)(rollator and cane) Transportation available at discharge: Wife and son Resource referrals recommended: Neuropsychology  Discharge Planning Living Arrangements: Spouse/significant other, Children Support Systems: Spouse/significant other, Children, Other relatives, Church/faith community, Friends/neighbors Type of Residence: Private residence Insurance Resources: Multimedia programmer (specify)([facility]) Financial Resources: SSD, Family Support Financial Screen Referred: No Living Expenses: Higher education careers adviser Management: Spouse Does the patient have any problems obtaining your medications?: No Home Management: Wife and son Patient/Family Preliminary Plans: Return home with wife and son who can provide assist. Wife works form home but at times needs to come in for meetings. Son is not currently employed and can assist him while Mom is at meetings. Therapy team feels will be here 5-7 days. Social Work Anticipated Follow Up Needs: HH/OP  Clinical Impression Pleasant gentleman who is wanting to go home and get therapies there. Wife is wanting him to stay for the CIR benefits. Made aware 5-7 day length of stay, so will be here this week. Pt has ben falling at home and needs to get more steady if can while here. Work on discharge needs and ask neuro-psych to see while  here.  Elease Hashimoto 11/05/2018, 12:05 PM

## 2018-11-05 NOTE — Progress Notes (Signed)
Kirsteins, Luanna Salk, MD  Physician  Physical Medicine and Rehabilitation  Consult Note  Signed  Date of Service:  11/01/2018 10:54 AM       Related encounter: ED to Hosp-Admission (Discharged) from 10/30/2018 in [place] [facility]      Signed      Expand All Collapse All    Show:Clear all [x] Manual[x] Template[] Copied  Added by: [x] Kirsteins, Luanna Salk, MD[x] Love, Ivan Anchors, PA-C  [] Hover for details      Physical Medicine and Rehabilitation Consult   Reason for Consult: Encephalopathy, History of myotonic dystrophy Referring Physician: Dr. Denton Brick   HPI: [name] is a 57 y.o. male with history of myotonic dystrophy with progressive weakness with dysphagia and expressive deficits, chronic respiratory failure, untreated OSA, ADHD, fatty liver; who was admitted from home with generalized weakness, multiple falls with difficulty getting off the floor and confusion x1 day.  UDS negative.  He was noted to be dehydrated and was treated with fluid bolus with improvement in mentation.  CT head and cervical spine negative for acute changes of fracture.  Chest x-ray and UA negative for infection.  Ammonia levels within normal limits.  PT evaluation done revealing confused mumbling speech, poor judgment, tachycardia with activity as well as cognitive deficits.  CIR recommended due to functional deficits  Patient sedentary with daytime fatigue and shortness of breath but reported to be independent prior to admission.  He stays/sleeps in the living on the couch and goes up the to shower about once a week.  Wife works in Scientist, research (medical) at [employer] from home few days a week.  Son (unemployed at this time) can provide supervision with wife at work.     Review of Systems  Constitutional: Negative for chills and fever.  HENT: Negative for hearing loss and tinnitus.   Eyes: Negative for blurred vision and double vision.  Respiratory: Positive for cough  (chronic). Negative for shortness of breath.   Cardiovascular: Negative for chest pain and palpitations.  Gastrointestinal: Negative for abdominal pain and heartburn.  Musculoskeletal: Positive for falls (cluster of falls every 6 weeks . ), joint pain (left knee pain) and myalgias.  Neurological: Positive for sensory change, speech change and weakness. Negative for dizziness and headaches.  Psychiatric/Behavioral: The patient does not have insomnia.          Past Medical History:  Diagnosis Date  . Adult ADHD   . Aspiration pneumonitis ([place]) 04/2017, 08/2017   Recurrent  . Chronic respiratory failure ([place])    As of 04/2018, Dr. Chase Caller suspects hypercapnic RF--but [name] normal.  May eventually need nocturnal noninvasive ventilation.  Ultimately, if progresses enough pt will need trach.  . Constipation, chronic    Has caused irritative urinary/bladder symptoms in the past.  . Depression   . Dizziness 06/2011+   Noncontrast CT and noncontrast MRI both negative 06/2011 at [facility] ED visit.  . Elevated transaminase level    Liver biopsy revealed fatty liver  . Erectile dysfunction   . Facial fracture ([place]) 06/2011   Nondisplaced bilateral LeFort I fracture: no surgery required. (ENT, Dr. Wilburn Cornelia)   . Fatty liver disease, nonalcoholic   . GERD (gastroesophageal reflux disease)   . History of thyroid cancer 2010   Medullary carcinoma of thyroid. Thyroidectomy 08/2009 (Dr. Aida Puffer at [facility]).  Needs periodic serum calcitonin monitoring.  Marland Kitchen Hyperlipidemia   . Hypothyroidism   . LVH (left ventricular hypertrophy)    by EKG criteria 06/2011.  Hx of heart murmur--Followed  [facility]ology at [facility]: EF's have been "stable" per cardio office note 12/29/10.  His echo 12/2012 showed normal LV size and wall thickness but EF 45-50% and global LV hypokinesis was found.  . Myotonic dystrophy ([place]) Dx'd 1996   Progressive muscle weakness and swallowing dysfunction.  Initial dx by Dr. Jannifer Franklin,  neurologist, but subsequent annual specialist f/u has been with Dr. Ladon Applebaum [facility].  Progressive debilitation--PT referral 04/2017, pt quit PT 05/2017.  . Nephrolithiasis   . Obesity, Class I, BMI 30-34.9   . OSA (obstructive sleep apnea)    does not wear CPAP  . Pulmonary nodule, right 05/2017   RUL, 7 mm sub solid nodule.  Resolution noted on f/u CT 08/2017.         Past Surgical History:  Procedure Laterality Date  . COLONOSCOPY  09/13/2007   normal (Dr. Deatra Ina).  . EXTRACORPOREAL SHOCK WAVE LITHOTRIPSY    . THYROIDECTOMY  08/2009   for medullary thyroid cancer ([facility] Conley Canal at [facility] follows him for this).  . TRANSTHORACIC ECHOCARDIOGRAM  12/2012; 09/2017   2014-Normal LV size and wall thickness, LV EF 45-50%, with global hypokinesis of LV.  2018- EF 50-55%, normal wall motion, grd I DD, valves normal.         Family History  Problem Relation Age of Onset  . Arthritis Mother   . Hypertension Mother   . Alcohol abuse Father   . Arthritis Father   . Hypertension Father   . Colon cancer Father   . Prostate cancer Father     Social History:  Married. Independent PTA. He reports that he has never smoked. He has never used smokeless tobacco. He reports that he does not drinks alcohol. He reports that he does not use drugs.    Allergies: No Known Allergies          Medications Prior to Admission  Medication Sig Dispense Refill  . acetaminophen (TYLENOL) 500 MG tablet Take 1,000 mg by mouth every 6 (six) hours as needed (pain).     Marland Kitchen amphetamine-dextroamphetamine (ADDERALL) 20 MG tablet 1 tab po tid (Patient taking differently: Take 20 mg by mouth 2 (two) times daily. ) 90 tablet 0  . FLUoxetine (PROZAC) 20 MG tablet TAKE 1 TABLET BY MOUTH ONCE DAILY (Patient taking differently: Take 20 mg by mouth daily. ) 30 tablet 3  . levothyroxine (SYNTHROID, LEVOTHROID) 150 MCG tablet TAKE 1 TABLET BY MOUTH ONCE DAILY EXCEPT FOR WEDNESDAY  SKIP DOSE  (Patient taking differently: Take 150 mcg by mouth See admin instructions. Take 1 tablet every day except on Sunday) 30 tablet 1  . omeprazole (PRILOSEC) 40 MG capsule Take 1 capsule (40 mg total) by mouth daily. 90 capsule 3  . traZODone (DESYREL) 100 MG tablet TAKE 1 TABLET BY MOUTH AT BEDTIME AS NEEDED FOR SLEEP. (Patient taking differently: Take 100 mg by mouth at bedtime as needed for sleep. ) 30 tablet 11  . levothyroxine (SYNTHROID, LEVOTHROID) 150 MCG tablet TAKE 1 TABLET BY MOUTH ONCE DAILY EXCEPT FOR WEDNESDAY AND FRIDAYS SKIP DOSE (Patient not taking: Reported on 10/31/2018) 30 tablet 0    Home: Home Living Family/patient expects to be discharged to:: Private residence Living Arrangements: Spouse/significant other Available Help at Discharge: Family, Available 24 hours/day Type of Home: House Home Access: Stairs to enter [employer] of Steps: 5 Entrance Stairs-Rails: Right, Left Home Layout: Two level, Bed/bath upstairs Alternate Level Stairs-Number of Steps: flight Alternate Level Stairs-Rails: Right, Left Bathroom Shower/Tub: Tub/shower unit Home Equipment:  Other (comment), Shower seat(has access to a walker but unsure if it is appropriate)  Functional History: Prior Function Level of Independence: Independent Comments: pt reports was ambulating without AD and completing ADLs without assist Functional Status:  Mobility: Bed Mobility Overal bed mobility: Needs Assistance Bed Mobility: Supine to Sit Supine to sit: Min assist General bed mobility comments: increased time and effort, assist for trunk elevation Transfers Overall transfer level: Needs assistance Equipment used: Rolling walker (2 wheeled), None Transfers: Sit to/from Stand Sit to Stand: Mod assist, +2 physical assistance General transfer comment: increased time and effort, multimodal cueing for safety and technique, assist to power into standing from EOB x1 and from toilet  x1 Ambulation/Gait Ambulation/Gait assistance: Min assist, +2 safety/equipment, +2 physical assistance Gait Distance (Feet): 75 Feet Assistive device: Rolling walker (2 wheeled) Gait Pattern/deviations: Step-through pattern, Decreased step length - right, Decreased step length - left, Decreased stride length, Shuffle, Drifts right/left General Gait Details: pt with modest instability requiring constant min A x2 for safety; pt impulsive and requiring frequent cueing for safety and to maintain a safe distance from RW. pt with HR increasing to as high as 131 bpm, pt asymptomatic throughout Gait velocity: decreased  ADL: ADL Overall ADL's : Needs assistance/impaired Eating/Feeding: Set up, Sitting Grooming: Set up, Min guard, Sitting Upper Body Bathing: Minimal assistance, Sitting Lower Body Bathing: Moderate assistance, +2 for physical assistance, +2 for safety/equipment, Sit to/from stand Upper Body Dressing : Minimal assistance, Sitting Lower Body Dressing: Moderate assistance, +2 for physical assistance, +2 for safety/equipment, Sit to/from stand Lower Body Dressing Details (indicate cue type and reason): pt able to don L sock, able to perform figure 4 technique with RLE though unable to maintain while donning sock, requires assist to complete Toilet Transfer: Minimal assistance, +2 for physical assistance, +2 for safety/equipment, RW, BSC Toilet Transfer Details (indicate cue type and reason): BSC over toilet; due to limited space in bathroom had to transition from RW with +2 assist to HHA+2 Toileting- Clothing Manipulation and Hygiene: Minimal assistance, Sit to/from stand, +2 for physical assistance, +2 for safety/equipment Toileting - Clothing Manipulation Details (indicate cue type and reason): assist for gown/brief management Functional mobility during ADLs: Minimal assistance, +2 for physical assistance, +2 for safety/equipment, Rolling walker  Cognition: Cognition Overall  Cognitive Status: No family/caregiver present to determine baseline cognitive functioning Orientation Level: Oriented to place, Oriented to situation, Oriented to person Cognition Arousal/Alertness: Awake/alert Behavior During Therapy: Impulsive Overall Cognitive Status: No family/caregiver present to determine baseline cognitive functioning Area of Impairment: Attention, Following commands, Safety/judgement, Awareness Current Attention Level: Selective Following Commands: Follows one step commands with increased time, Follows one step commands inconsistently Safety/Judgement: Decreased awareness of safety Awareness: Emergent General Comments: pt impulsive during mobility and completion of functional tasks requiring increased cues for safety; requires increased time and multimodal cues to sequence/follow one step commands. Pt with difficulty completing dual task, when pt responding to questions while walking noted pt often having to stop and stand still prior to responding   Blood pressure (!) 145/65, pulse 78, temperature 97.9 F (36.6 C), temperature source Oral, resp. rate 18, height 5\' 5"  (1.651 m), weight 86.2 kg, SpO2 95 %. Physical Exam  Nursing note and vitals reviewed. Constitutional: He is oriented to person, place, and time. He appears well-developed and well-nourished.  Cardiovascular: Normal rate and regular rhythm.  Respiratory: Effort normal and breath sounds normal. No stridor. No respiratory distress. He has no wheezes.  GI: Soft. Bowel sounds are normal. He  exhibits no distension. There is tenderness.  Neurological: He is alert and oriented to person, place, and time. He displays no tremor. No sensory deficit. Coordination abnormal.  Flat affect with low voice. Does not make eye contact. Slow to process with occasional inappropriate comments.  Mild facial diplegia has difficulty following commands needs step-by-step instructions, motor strength is 4/5 bilateral deltoid  bicep tricep grip hip flexor knee extensor ankle dorsiflexor Sensation intact pinprick and light touch bilateral upper and lower limbs No myotonia elicited with grasp and release, no percussion myotonia. Poor fine motor dexterity in both hands  Skin: Skin is warm and dry.  Psychiatric: His affect is blunt. His speech is delayed and slurred. He is slowed and withdrawn. Cognition and memory are impaired.           Assessment/Plan: Diagnosis: Decline in functional mobility and ADL skills following multiple falls, question progression of myotonic dystrophy 1. Does the need for close, 24 hr/day medical supervision in concert with the patient's rehab needs make it unreasonable for this patient to be served in a less intensive setting? Yes 2. Co-Morbidities requiring supervision/potential complications: Respiratory failure, chronic dysphagia, cognitive deficits related to myotonic dystrophy 3. Due to bladder management, bowel management, safety, skin/wound care, disease management, medication administration, pain management and patient education, does the patient require 24 hr/day rehab nursing? Yes 4. Does the patient require coordinated care of a physician, rehab nurse, PT (1-2 hrs/day, 5 days/week), OT (1-2 hrs/day, 5 days/week) and SLP (.5-1 hrs/day, 5 days/week) to address physical and functional deficits in the context of the above medical diagnosis(es)? Yes Addressing deficits in the following areas: balance, endurance, locomotion, strength, transferring, bowel/bladder control, bathing, dressing, feeding, grooming, toileting, cognition, speech, language, swallowing and psychosocial support 5. Can the patient actively participate in an intensive therapy program of at least 3 hrs of therapy per day at least 5 days per week? Yes 6. The potential for patient to make measurable gains while on inpatient rehab is good 7. Anticipated functional outcomes upon discharge from inpatient rehab are modified  independent and supervision  with PT, modified independent and supervision with OT, modified independent and supervision with SLP. 8. Estimated rehab length of stay to reach the above functional goals is: 8-12d 9. Anticipated D/C setting: Home 10. Anticipated post D/C treatments: [place] therapy 11. Overall Rehab/Functional Prognosis: good  RECOMMENDATIONS: This patient's condition is appropriate for continued rehabilitative care in the following setting: CIR Patient has agreed to participate in recommended program. Yes Note that insurance prior authorization may be required for reimbursement for recommended care.  Comment:  "I have personally performed a face to face diagnostic evaluation of this patient.  Additionally, I have reviewed and concur with the physician assistant's documentation above." Charlett Blake M.D. [facility] FAAPM&R (Sports Med, Neuromuscular Med) Diplomate [place], PA-C 11/01/2018        Revision History                   Routing History

## 2018-11-05 NOTE — Progress Notes (Signed)
Speech Language Pathology Daily Session Note  Patient Details  Name: [name] MRN: [id number] Date of Birth: 06/18/1961  Today's Date: 11/05/2018 SLP Individual Time: 8676-7209 SLP Individual Time Calculation (min): 45 min  Short Term Goals: Week 1: SLP Short Term Goal 1 (Week 1): Pt will utilize speech intelligibility strategies to increase intelligibility at the sentence level to ~ 80% with supervision cues.  SLP Short Term Goal 2 (Week 1): Pt will express own thoughts with supervision cues for word finding strategies.   Skilled Therapeutic Interventions:  Skilled treatment session focused on communication goals. Pt with appropriate questions regarding exercise and myotonic dystrophy and > 90% intelligibility at the phrase level. Pt had PT session earlier in day and was fatigued as this session likely provided more activity than pt was accustomed to. Info relayed to PT. Pt politely declined [name] testing d/t fatigue. Strategies to continue increasing speech intelligibility were reviewed and pt able to continue to implement in session. Pt left semi-reclined in bed, bed alarm on and all needs within reach. Continue per current plan of care with [name] on next available date.      Pain Pain Assessment Pain Scale: 0-10 Pain Score: 0-No pain  Therapy/Group: Individual Therapy  [name] 11/05/2018, 2:07 PM

## 2018-11-05 NOTE — Progress Notes (Signed)
Occupational Therapy Session Note  Patient Details  Name: [name] MRN: [id number] Date of Birth: 02-25-1961  Today's Date: 11/05/2018 OT Individual Time: 1415-1535 OT Individual Time Calculation (min): 80 min    Short Term Goals: Week 1:  OT Short Term Goal 1 (Week 1): STG=LTG d/t ELOS   Skilled Therapeutic Interventions/Progress Updates:    Patient in bed upon arrival, denies pain and agrees to complete shower today. Bed mobility:  CS Functional transfers:  CG SPT to/from toilet, tub bench in shower, bed, w/c without AD Functional mobility:  CG ambulation on unit without AD cues for forward lean ADL:   toileting - min A    Shower - min A (assist for buttock, lower legs/feet)    UB dressing - set up    LB dressing - mod A pants over feet, max A slipper socks    Eating - supervision, cues Patient is talkative and aware of current events.  Returned to bed to rest at close of session with alarm set.  Therapy Documentation Precautions:  Precautions Precautions: Fall Restrictions Weight Bearing Restrictions: No General:   Vital Signs:   Pain: Pain Assessment Pain Scale: 0-10 Pain Score: 0-No pain   Therapy/Group: Individual Therapy  Carlos Levering 11/05/2018, 3:55 PM

## 2018-11-05 NOTE — Progress Notes (Signed)
Physical Therapy Session Note  Patient Details  Name: [name] MRN: [id number] Date of Birth: 1961-05-23  Today's Date: 11/05/2018 PT Individual Time: 0800-0900 PT Individual Time Calculation (min): 60 min   Short Term Goals: Week 1:  PT Short Term Goal 1 (Week 1): =LTGs due to ELOS  Skilled Therapeutic Interventions/Progress Updates:    Pt received asleep in bed; awakened easily; denies pain and agreeable to PT treatment. MinA bed mobility. CGA sit>stand throughout session. Assist required for threading LEs through pant legs, but able to pull pants over hips while standing with CGA.CGA gait without AD throughout session, up to 150'.   Session focused on LE coordination, increased step length, foot clearance, endurance, and balance: - CGA forward and sideways stepping through floor ladder; verbal cues for foot placement, body position, and upright posture - CGA gait trials: horizontal head turns, vertical head turns, change in gait speed, tandem, cone weaving; reports of "dizziness" with cone weaving and required seated rest break between trials; 2 trials each - CGA stair navigation: 4 six inch steps with B handrails x3; reciprocal pattern with ascent; step to pattern and poor eccentric control with descent  LE strengthening/endurance:  - CGA lateral step ups on 3 inch step 2x15 each LE  Throughout session pt exhibited poor articulation and difficulty expressing needs/thoughts; required mod cues for clarification. Pt remained seated at EOB with breakfast tray, bed alarm intact, and all needs in reach at the end of the session.  Therapy Documentation Precautions:  Precautions Precautions: Fall Restrictions Weight Bearing Restrictions: No Pain: Pain Assessment Pain Scale: 0-10 Pain Score: 0-No pain    Therapy/Group: Individual Therapy  [place] Ariv Penrod 11/05/2018, 10:23 AM

## 2018-11-05 NOTE — Progress Notes (Signed)
[place] PHYSICAL MEDICINE & REHABILITATION PROGRESS NOTE   Subjective/Complaints: Patient seen laying in bed this morning.  He states he slept well overnight.  He states he had a good weekend.  ROS: Denies CP, SOB, N/V/D  Objective:   No results found. Recent Labs    11/03/18 0551  WBC 3.3*  HGB 13.9  HCT 43.8  PLT 163   Recent Labs    11/03/18 0551  NA 138  K 4.1  CL 101  CO2 27  GLUCOSE 81  BUN 8  CREATININE 1.02  CALCIUM 9.4    Intake/Output Summary (Last 24 hours) at 11/05/2018 0908 Last data filed at 11/05/2018 0527 Gross per 24 hour  Intake 240 ml  Output 325 ml  Net -85 ml     Physical Exam: Vital Signs Blood pressure 110/80, pulse 98, temperature 98.3 F (36.8 C), temperature source Oral, resp. rate 16, height 5\' 5"  (1.651 m), weight 92.3 kg, SpO2 95 %.   Constitutional: No distress . Vital signs reviewed. HENT: Normocephalic.  Atraumatic. Eyes: EOMI. No discharge. Cardiovascular: RRR. No JVD. Respiratory: CTA Bilaterally. Normal effort. GI: BS +. Non-distended. Musc: No edema or tenderness in extremities. Neurologic: Alert. Dysarthria Motor: 5/5 in bilateral deltoid, bicep, tricep, grip, hip flexor, knee extensors, ankle dorsiflexor and plantar flexor Skin: Warm and dry.  Intact.  Assessment/Plan: 1. Functional deficits secondary to Myotonic dystrophy with falls which require 3+ hours per day of interdisciplinary therapy in a comprehensive inpatient rehab setting.  Physiatrist is providing close team supervision and 24 hour management of active medical problems listed below.  Physiatrist and rehab team continue to assess barriers to discharge/monitor patient progress toward functional and medical goals  Care Tool:  Bathing    Body parts bathed by patient: Chest, Abdomen, Front perineal area, Right upper leg, Left upper leg, Face   Body parts bathed by helper: Right arm, Left arm, Buttocks, Right lower leg, Left lower leg      Bathing assist Assist Level: Moderate Assistance - Patient 50 - 74%     Upper Body Dressing/Undressing Upper body dressing   What is the patient wearing?: Pull over shirt    Upper body assist Assist Level: Moderate Assistance - Patient 50 - 74%    Lower Body Dressing/Undressing Lower body dressing      What is the patient wearing?: Pants     Lower body assist Assist for lower body dressing: Moderate Assistance - Patient 50 - 74%     Toileting Toileting    Toileting assist Assist for toileting: Moderate Assistance - Patient 50 - 74%     Transfers Chair/bed transfer  Transfers assist     Chair/bed transfer assist level: Contact Guard/Touching assist     Locomotion Ambulation   Ambulation assist      Assist level: Contact Guard/Touching assist Assistive device: Other (comment)(none) Max distance: 50'   Walk 10 feet activity   Assist     Assist level: Contact Guard/Touching assist Assistive device: Other (comment)(none)   Walk 50 feet activity   Assist    Assist level: Contact Guard/Touching assist      Walk 150 feet activity   Assist Walk 150 feet activity did not occur: Safety/medical concerns         Walk 10 feet on uneven surface  activity   Assist Walk 10 feet on uneven surfaces activity did not occur: Safety/medical concerns         Wheelchair     Assist Will patient use  wheelchair at discharge?: No Type of Wheelchair: Manual    Wheelchair assist level: Contact Guard/Touching assist Max wheelchair distance: 31'    Wheelchair 50 feet with 2 turns activity    Assist        Assist Level: Contact Guard/Touching assist   Wheelchair 150 feet activity     Assist Wheelchair 150 feet activity did not occur: Safety/medical concerns(fatigue)        Medical Problem List and Plan: 1.Debility and encephalopathysecondary to AKI/dehydration in a 57 yo male with myotonic dystrophy  Continue CIR  Notes  reviewed- generalized weakness with multiple falls images reviewed- CT head unremarkable for acute intracranial process, however?  Lung abnormalities noted, labs reviewed 2. DVT Prophylaxis/Anticoagulation: Pharmaceutical:Lovenox 3. Pain Management:Tylenol as needed 4. Mood:LCSW to follow for evaluation and support 5. Neuropsych: This patientis not fullycapable of making decisions on hisown behalf. 6. Skin/Wound Care:Routine pressure relief measures 7. Fluids/Electrolytes/Nutrition:Monitor I's and O's. BMET normal 11/9 8.Myotonic dystrophy: With reports of progression. Respiratory status stable 9.ADHD: Continue Adderall 3 times daily 10.Thrombocytopenia: Resolved  Platelets 163 on 11/9 11.?  Lung nodule  Repeat imaging around 01/2019  Chest x-ray ordered on 11/11 12.  Leukopenia  WBC 3.3 on 11/9  Labs ordered for tomorrow  LOS:3 days A FACE TO FACE EVALUATION WAS PERFORMED  Ankit Lorie Phenix 11/05/2018, 9:08 AM

## 2018-11-06 ENCOUNTER — Inpatient Hospital Stay (HOSPITAL_COMMUNITY): Payer: 59 | Admitting: Occupational Therapy

## 2018-11-06 ENCOUNTER — Inpatient Hospital Stay (HOSPITAL_COMMUNITY): Payer: 59 | Admitting: Speech Pathology

## 2018-11-06 ENCOUNTER — Inpatient Hospital Stay (HOSPITAL_COMMUNITY): Payer: 59 | Admitting: Physical Therapy

## 2018-11-06 DIAGNOSIS — R9389 Abnormal findings on diagnostic imaging of other specified body structures: Secondary | ICD-10-CM

## 2018-11-06 LAB — CBC WITH DIFFERENTIAL/PLATELET
Abs Immature Granulocytes: 0.02 10*3/uL (ref 0.00–0.07)
BASOS ABS: 0 10*3/uL (ref 0.0–0.1)
Basophils Relative: 1 %
EOS PCT: 2 %
Eosinophils Absolute: 0.1 10*3/uL (ref 0.0–0.5)
HEMATOCRIT: 42.6 % (ref 39.0–52.0)
HEMOGLOBIN: 13.4 g/dL (ref 13.0–17.0)
Immature Granulocytes: 1 %
LYMPHS ABS: 1.2 10*3/uL (ref 0.7–4.0)
LYMPHS PCT: 32 %
MCH: 29.4 pg (ref 26.0–34.0)
MCHC: 31.5 g/dL (ref 30.0–36.0)
MCV: 93.4 fL (ref 80.0–100.0)
MONO ABS: 0.3 10*3/uL (ref 0.1–1.0)
MONOS PCT: 9 %
Neutro Abs: 2.1 10*3/uL (ref 1.7–7.7)
Neutrophils Relative %: 55 %
Platelets: 166 10*3/uL (ref 150–400)
RBC: 4.56 MIL/uL (ref 4.22–5.81)
RDW: 14.6 % (ref 11.5–15.5)
WBC: 3.7 10*3/uL — ABNORMAL LOW (ref 4.0–10.5)
nRBC: 0 % (ref 0.0–0.2)

## 2018-11-06 MED ORDER — ACETAMINOPHEN 325 MG PO TABS: 325.0000 mg | ORAL_TABLET | ORAL | Status: DC | PRN

## 2018-11-06 NOTE — Progress Notes (Signed)
Occupational Therapy Session Note  Patient Details  Name: BRONCO MCGRORY MRN: [id number] Date of Birth: [birth year]  Today's Date: 11/06/2018 OT Individual Time: 1130-1200 OT Individual Time Calculation (min): 30 min    Short Term Goals: Week 1:  OT Short Term Goal 1 (Week 1): STG=LTG d/t ELOS       Skilled Therapeutic Interventions/Progress Updates:    Pt received in bed and agreeable to working on AROM exercises for his UE and standing balance exercises with the RW. Pt stood with CGA and completed marching in place, heel raises, weight shifts with CGA.  Pt transferred to wc and set up meal tray for pt's lunch.  Chair belt on and all needs met.  Therapy Documentation Precautions:  Precautions Precautions: Fall Restrictions Weight Bearing Restrictions: No   Pain: Pain Assessment Pain Score: 0-No pain ADL: ADL Grooming: Minimal assistance Where Assessed-Grooming: Standing at sink Upper Body Bathing: Supervision/safety Where Assessed-Upper Body Bathing: Wheelchair Lower Body Bathing: Minimal assistance Where Assessed-Lower Body Bathing: Wheelchair Upper Body Dressing: Supervision/safety Where Assessed-Upper Body Dressing: Wheelchair Lower Body Dressing: Minimal assistance Where Assessed-Lower Body Dressing: Wheelchair Toileting: Minimal assistance Where Assessed-Toileting: Glass blower/designer: Psychiatric nurse Method: Counselling psychologist: Grab bars   Therapy/Group: Individual Therapy  , 11/06/2018, 12:44 PM

## 2018-11-06 NOTE — Progress Notes (Signed)
Speech Language Pathology Daily Session Note  Patient Details  Name: [name] MRN: [id number] Date of Birth: 1961-08-20  Today's Date: 11/06/2018 SLP Individual Time: 0810-0900 SLP Individual Time Calculation (min): 50 min  Short Term Goals: Week 1: SLP Short Term Goal 1 (Week 1): Pt will utilize speech intelligibility strategies to increase intelligibility at the sentence level to ~ 80% with supervision cues.  SLP Short Term Goal 2 (Week 1): Pt will express own thoughts with supervision cues for word finding strategies.   Skilled Therapeutic Interventions:  Pt was seen for skilled ST targeting communication goals.  Pt was received upright in wheelchair upon arrival, awake, alert, and agreeable to participating in [place].  Pt needed min-mod cues to increase vocal intensity to achieve intelligibility at the conversational level depending on environmental noise.  SLP administered respiratory muscle strength training ([name]) assessment to measure peak maximum inspiratory and expiratory pressures (MIP and MEP respectively).  Both MIP and MEP were well below lower limits of normal, therefore [name] was initiated with expiratory and inspiratory trainers.  Pt completed 5 repetitions with devices set at 50% of maximum pressure with a self perceived effort level of <5.  Pt was instructed on device use and provided with handout to maximize carryover.  Pt was returned to room and left in wheelchair with chair alarm set and call bell within reach.  Continue per current plan of care.    Pain Pain Assessment Pain Scale: 0-10 Pain Score: 0-No pain  Therapy/Group: Individual Therapy  Anadelia Kintz, Selinda Orion 11/06/2018, 3:50 PM

## 2018-11-06 NOTE — Evaluation (Signed)
Recreational Therapy Assessment and Plan  Patient Details  Name: [name] MRN: [id number] Date of Birth: 1961/04/02 Today's Date: 11/06/2018 Time:  3845-3646 Pain:  No c/o  Rehab Potential: Good ELOS: 7 days  Assessment      Patient Active Problem List   Diagnosis Date Noted  . Encephalopathy 11/02/2018  . Acute metabolic encephalopathy 80/32/1224  . Pulmonary nodule 10/31/2018  . AKI (acute kidney injury) ([place]) 10/31/2018  . Generalized weakness 10/31/2018  . Fall 10/31/2018  . Weakness   . Aspiration pneumonia ([place]) 05/09/2017  . History of fall 05/09/2017  . Viral gastroenteritis 03/13/2014  . Autonomic dysfunction 09/05/2013  . Tachycardia 02/20/2013  . ADD (attention deficit disorder) 08/15/2012  . Depression 08/15/2012  . History of thyroid cancer   . Dizziness, nonspecific 12/07/2011  . Hypothyroidism 12/07/2011  . DEPRESSION 03/20/2008  . Myotonic muscular dystrophy ([place]) 03/20/2008  . GERD 03/20/2008  . SLEEP APNEA 03/20/2008    Past Medical History:      Past Medical History:  Diagnosis Date  . Adult ADHD   . Aspiration pneumonitis ([place]) 04/2017, 08/2017   Recurrent  . Chronic respiratory failure ([place])    As of 04/2018, Dr. Chase Caller suspects hypercapnic RF--but [name] normal.  May eventually need nocturnal noninvasive ventilation.  Ultimately, if progresses enough pt will need trach.  . Constipation, chronic    Has caused irritative urinary/bladder symptoms in the past.  . Depression   . Dizziness 06/2011+   Noncontrast CT and noncontrast MRI both negative 06/2011 at [facility] ED visit.  . Elevated transaminase level    Liver biopsy revealed fatty liver  . Erectile dysfunction   . Facial fracture ([place]) 06/2011   Nondisplaced bilateral [place] I fracture: no surgery required. (ENT, Dr. Wilburn Cornelia)   . Fatty liver disease, nonalcoholic   . GERD (gastroesophageal reflux disease)   . History of thyroid cancer 2010   Medullary carcinoma  of thyroid. Thyroidectomy 08/2009 (Dr. Aida Puffer at [facility]).  Needs periodic serum calcitonin monitoring.  Marland Kitchen Hyperlipidemia   . Hypothyroidism   . LVH (left ventricular hypertrophy)    by EKG criteria 06/2011.  Hx of heart murmur--Followed by cardiology at [facility]: EF's have been "stable" per cardio office note 12/29/10.  His echo 12/2012 showed normal LV size and wall thickness but EF 45-50% and global LV hypokinesis was found.  . Myotonic dystrophy ([place]) Dx'd 1996   Progressive muscle weakness and swallowing dysfunction.  Initial dx by Dr. Jannifer Franklin, neurologist, but subsequent annual specialist f/u has been with Dr. Ladon [facility].  Progressive debilitation--PT referral 04/2017, pt quit PT 05/2017.  . Nephrolithiasis   . Obesity, Class I, BMI 30-34.9   . OSA (obstructive sleep apnea)    does not wear CPAP  . Pulmonary nodule, right 05/2017   RUL, 7 mm sub solid nodule.  Resolution noted on f/u CT 08/2017.   Past Surgical History:       Past Surgical History:  Procedure Laterality Date  . COLONOSCOPY  09/13/2007   normal (Dr. Deatra Ina).  . EXTRACORPOREAL SHOCK WAVE LITHOTRIPSY    . THYROIDECTOMY  08/2009   for medullary thyroid cancer (Dr. Conley Canal at [facility] follows him for this).  . TRANSTHORACIC ECHOCARDIOGRAM  12/2012; 09/2017   2014-Normal LV size and wall thickness, LV EF 45-50%, with global hypokinesis of LV.  2018- EF 50-55%, normal wall motion, grd I DD, valves normal.    Assessment & Plan Clinical Impression: Patient is a 57 y.o. year old male with history  of myotonic dystrophy with progressive weakness,dysphagia and expressive deficits, chronic respiratory failure, ADHD, fatty liver; who was admitted from home with generalized weakness, multiple falls with difficulty getting off the floor as well as confusion. UDS was negative. He was noted to be dehydrated and treated with fluid boluses with improvement in mentation. Work-up negative for infection or acute  changes and encephalopathy felt to be due to AKI due to dehydration.Therapy evaluations done revealing cognitive deficits with poor safety awareness as well as deficits in mobility and ability to carry out ADLs.CIR recommended for follow-up therapy.  Patient transferred to CIR on 11/02/2018.    Pt presents with decreased activity tolerance, decreased functional mobility, decreased balance, decreased leisure awareness, decreased safety awareness Limiting pt's independence with leisure/community pursuits.  Plan Min 1 TR session >20 minutes during LOS  Recommendations for other services: Neuropsych  Discharge Criteria: Patient will be discharged from TR if patient refuses treatment 3 consecutive times without medical reason.  If treatment goals not met, if there is a change in medical status, if patient makes no progress towards goals or if patient is discharged from hospital.  The above assessment, treatment plan, treatment alternatives and goals were discussed and mutually agreed upon: by patient  [place] 11/06/2018, 4:04 PM

## 2018-11-06 NOTE — Progress Notes (Signed)
[place] PHYSICAL MEDICINE & REHABILITATION PROGRESS NOTE   Subjective/Complaints: Patient seen laying in bed this morning.  He states he slept well overnight.  He still sleepy this morning. No reported issues overnight.   ROS: Denies CP, SOB, N/V/D  Objective:   Dg Chest 2 View  Result Date: 11/05/2018 CLINICAL DATA:  Areas of linear atelectasis or scarring at both lung bases crash that abnormal right upper lobe finding on cervical spine CT. EXAM: CHEST - 2 VIEW COMPARISON:  Radiography 10/31/2018. CT 10/31/2018. Chest CT 09/03/2017. FINDINGS: Heart size is normal. There is linear scarring and/or atelectasis in both lower lungs. In the right lateral apex, there is an indistinct density corresponding to the findings at CT. This is nonspecific and could represent infiltrate, atelectasis or a mass. Plain radiographic follow-up or complete chest CT is suggested. IMPRESSION: Linear atelectasis or scarring at the lung bases. Indistinct density at the right apex laterally which is nonspecific. This could represent atelectasis, scar, infiltrate or developing mass. There is no abnormality in this location on the CT scan of the chest done September 2018. Followup is suggested. This could be done with radiography or chest CT. Electronically Signed   By: Nelson Chimes M.D.   On: 11/05/2018 12:36   Recent Labs    11/06/18 0508  WBC 3.7*  HGB 13.4  HCT 42.6  PLT 166   No results for input(s): NA, K, CL, CO2, GLUCOSE, BUN, CREATININE, CALCIUM in the last 72 hours.  Intake/Output Summary (Last 24 hours) at 11/06/2018 0820 Last data filed at 11/05/2018 2018 Gross per 24 hour  Intake 477 ml  Output 325 ml  Net 152 ml     Physical Exam: Vital Signs Blood pressure 108/75, pulse 84, temperature (!) 97.3 F (36.3 C), temperature source Oral, resp. rate 15, height 5\' 5"  (1.651 m), weight 92.3 kg, SpO2 98 %.   Constitutional: No distress . Vital signs reviewed. HENT: Normocephalic.   Atraumatic. Eyes: EOMI. No discharge. Cardiovascular: RRR.  No JVD. Respiratory: CTA bilaterally.  Normal effort. GI: BS +. Non-distended. Musc: No edema or tenderness in extremities. Neurologic: Alert and oriented x3. Dysarthria Motor: 5/5 in bilateral deltoid, bicep, tricep, grip, hip flexor, knee extensors, ankle dorsiflexor and plantar flexor Skin: Warm and dry.  Intact.  Assessment/Plan: 1. Functional deficits secondary to Myotonic dystrophy with falls which require 3+ hours per day of interdisciplinary therapy in a comprehensive inpatient rehab setting.  Physiatrist is providing close team supervision and 24 hour management of active medical problems listed below.  Physiatrist and rehab team continue to assess barriers to discharge/monitor patient progress toward functional and medical goals  Care Tool:  Bathing    Body parts bathed by patient: Right arm, Left arm, Chest, Abdomen, Front perineal area, Right upper leg, Left upper leg, Face   Body parts bathed by helper: Buttocks, Right lower leg, Left lower leg     Bathing assist Assist Level: Minimal Assistance - Patient > 75%     Upper Body Dressing/Undressing Upper body dressing   What is the patient wearing?: Pull over shirt    Upper body assist Assist Level: Supervision/Verbal cueing    Lower Body Dressing/Undressing Lower body dressing      What is the patient wearing?: Underwear/pull up, Pants     Lower body assist Assist for lower body dressing: Moderate Assistance - Patient 50 - 74%     Toileting Toileting    Toileting assist Assist for toileting: Minimal Assistance - Patient > 75%  Transfers Chair/bed transfer  Transfers assist     Chair/bed transfer assist level: Contact Guard/Touching assist     Locomotion Ambulation   Ambulation assist      Assist level: Contact Guard/Touching assist Assistive device: Other (comment)(none) Max distance: 150   Walk 10 feet  activity   Assist     Assist level: Contact Guard/Touching assist Assistive device: Other (comment)(none)   Walk 50 feet activity   Assist    Assist level: Contact Guard/Touching assist      Walk 150 feet activity   Assist Walk 150 feet activity did not occur: Safety/medical concerns  Assist level: Contact Guard/Touching assist      Walk 10 feet on uneven surface  activity   Assist Walk 10 feet on uneven surfaces activity did not occur: Safety/medical concerns         Wheelchair     Assist Will patient use wheelchair at discharge?: No Type of Wheelchair: Manual    Wheelchair assist level: Contact Guard/Touching assist Max wheelchair distance: 42'    Wheelchair 50 feet with 2 turns activity    Assist        Assist Level: Contact Guard/Touching assist   Wheelchair 150 feet activity     Assist Wheelchair 150 feet activity did not occur: Safety/medical concerns(fatigue)        Medical Problem List and Plan: 1.Debility and encephalopathysecondary to AKI/dehydration in a 57 yo male with myotonic dystrophy  Continue CIR 2. DVT Prophylaxis/Anticoagulation: Pharmaceutical:Lovenox 3. Pain Management:Tylenol as needed 4. Mood:LCSW to follow for evaluation and support 5. Neuropsych: This patientis not fullycapable of making decisions on hisown behalf. 6. Skin/Wound Care:Routine pressure relief measures 7. Fluids/Electrolytes/Nutrition:Monitor I's and O's.   BMET within normal limits on 11/9 8.Myotonic dystrophy: With reports of progression. Respiratory status stable 9.ADHD: Continue Adderall 3 times daily 10.Thrombocytopenia: Resolved  Platelets 163 on 11/9 11.?  Lung nodule  Repeat imaging ~01/2019  Chest x-ray reviewed 11/11, reviewed right apex abnormality 12.  Leukopenia  WBC 3.7 on 11/12  Cont to monitor  LOS:4 days A FACE TO FACE EVALUATION WAS PERFORMED  Shey Yott Lorie Phenix 11/06/2018, 8:20 AM

## 2018-11-06 NOTE — Progress Notes (Addendum)
Physical Therapy Session Note  Patient Details  Name: TRENTIN KNAPPENBERGER MRN: [id number] Date of Birth: Dec 15, 1961  Today's Date: 11/06/2018 PT Individual Time: 1452-1535 PT Individual Time Calculation (min): 43 min   Short Term Goals: Week 1:  PT Short Term Goal 1 (Week 1): =LTGs due to ELOS  Skilled Therapeutic Interventions/Progress Updates:  Pt received in bed with recreational therapist present in room but exiting halfway through session. Pt transferred to EOB with supervision and hospital bed features and ambulates room<>gym with RW & min assist with heavy cuing to ambulate within base of RW. In gym pt transferred to standing on compliant airex foam and engaged in ball toss with min<>max assist for standing balance with cuing to correct posterior LOB but impaired ability to do so. Pt performed standing exercises with BUE support on RW and min<>mod assist for balance; pt performed hip/knee flexion, heel raises (difficulty clearing heels from floor), and mini squats for BLE strengthening with cuing for proper technique. Pt negotiates 12 steps with B rails & min assist. Pt ambulates back to room with RW & max cuing for pathfinding. From EOB pt performs 10 reps of sit<>stand without BUE support & supervision with task focusing on BLE strengthening. Once supine in bed pt performed BLE bridging for strengthening. Pt left in bed with alarm set & call bell in reach.  Pt also requires frequent seated rest breaks 2/2 fatigue. Pt appears to have tremulous movements in all extremities.  Pt very internally distracted throughout session with tangential conversation requiring cuing for attention to tasks.   Therapy Documentation Precautions:  Precautions Precautions: Fall Restrictions Weight Bearing Restrictions: No   Therapy/Group: Individual Therapy  Waunita Schooner 11/06/2018, 3:39 PM

## 2018-11-06 NOTE — Progress Notes (Signed)
Physical Therapy Session Note  Patient Details  Name: KAYLON HITZ MRN: [id number] Date of Birth: 1961-07-03  Today's Date: 11/06/2018 PT Individual Time: 0900-1000 PT Individual Time Calculation (min): 60 min   Short Term Goals: Week 1:  PT Short Term Goal 1 (Week 1): =LTGs due to ELOS  Skilled Therapeutic Interventions/Progress Updates:    Pt received seated in w/c; denies pain and agreeable to PT treatment. S ambulation with RW to rehab gym x 150'. Standing balance exercises on airex with CGA: normal BOS>narrower BOS>narrow BOS; each with eyes open and eyes closed. S sit>stands 3x10 for LE strengthening. CGA toe taps on 6 inch step without UE support for LE coordination, LE strengthening, and endurance; 3x10 each leg. Attempted four square step, but increased fear with backwards stepping. 3 backwards gait trials for stepping strategies; verbal cues for larger step length and upright posture. Attempted forward/backward stepping over pole; but pt refused. Pt remained in w/c with alarm intact and all needs in reach.  Therapy Documentation Precautions:  Precautions Precautions: Fall Restrictions Weight Bearing Restrictions: No    Therapy/Group: Individual Therapy  [place] Marlisa Caridi 11/06/2018, 12:25 PM

## 2018-11-07 ENCOUNTER — Inpatient Hospital Stay (HOSPITAL_COMMUNITY): Payer: 59 | Admitting: Occupational Therapy

## 2018-11-07 ENCOUNTER — Inpatient Hospital Stay (HOSPITAL_COMMUNITY): Payer: 59 | Admitting: Speech Pathology

## 2018-11-07 ENCOUNTER — Ambulatory Visit (HOSPITAL_COMMUNITY): Payer: 59 | Admitting: *Deleted

## 2018-11-07 DIAGNOSIS — G7111 Myotonic muscular dystrophy: Secondary | ICD-10-CM

## 2018-11-07 MED ORDER — GERHARDT'S BUTT CREAM
TOPICAL_CREAM | Freq: Four times a day (QID) | CUTANEOUS | Status: DC
  Administered 2018-11-07: 1 via TOPICAL
  Administered 2018-11-07 – 2018-11-10 (×8): via TOPICAL
  Filled 2018-11-07: qty 1

## 2018-11-07 NOTE — Progress Notes (Signed)
Physical Therapy Session Note  Patient Details  Name: [name] MRN: [id number] Date of Birth: Aug 26, 1961  Today's Date: 11/07/2018 PT Individual Time: 1315-1400 PT Individual Time Calculation (min): 45 min   Short Term Goals: Week 1:  PT Short Term Goal 1 (Week 1): =LTGs due to ELOS  Skilled Therapeutic Interventions/Progress Updates:    Skilled co-treat with rec therapist for functional mobility and balance.   Pt received laying in bed; denies pain but reports he didn't eat lunch because he "felt lazy" and call nurse to help set it up. MinA bed mobility and S stand pivot transfer to w/c. Set up lunch tray for pt to eat with lap alarm intact.  Returned to room after pt finished eating lunch and he was agreeable to PT session. CGA ambulation throughout session with min/mod verbal cues for safety awareness and upright posture. Brief change in standing without UE support; assist provided for clothing management. CGA standing on airex with ball tosses; modA for posterior LOBs. Due to decreased DF ROM, pt exhibits decreased appropriate ankle strategies. Modified to standing balance on small wedge in DF for calf stretch and to promote anterior weightshifts to prevent posterior LOBs; modA/maxA for correcting posterior LOBs but faded to CGA; pt able to perform counting task during standing and able to throw ball 5 times on small wedge. Floor transfer with minA to get down and modA x2 to get up with increased time; heavy verbal cueing for sequence, technique, and problem solving; demonstration provided. Pt remained seated in w/c with lap alarm intact and all needs in reach at the end of the session.   Therapy Documentation Precautions:  Precautions Precautions: Fall Restrictions Weight Bearing Restrictions: No Vital Signs: Therapy Vitals Temp: 97.9 F (36.6 C) Temp Source: Oral Pulse Rate: (!) 109 Resp: 14 BP: 139/81 Patient Position (if appropriate): Sitting Oxygen Therapy SpO2:  99 % O2 Device: Room Air Pain: Pain Assessment Pain Scale: 0-10 Pain Score: 0-No pain    Therapy/Group: Individual Therapy  [place] Tamu Golz 11/07/2018, 4:09 PM

## 2018-11-07 NOTE — Progress Notes (Signed)
Occupational Therapy Session Note  Patient Details  Name: [name] MRN: [id number] Date of Birth: [birth year]  Today's Date: 11/07/2018 OT Individual Time: 0375-4360 OT Individual Time Calculation (min): 58 min    Short Term Goals: Week 1:  OT Short Term Goal 1 (Week 1): STG=LTG d/t ELOS   Skilled Therapeutic Interventions/Progress Updates:    Patient seated in w/c wearing hospital gown at start of therapy session.  He states that he would like to get dressed but does not want a shower today.   ADL:  Grooming - oral care with set up assist, washes face with S, min cues    UB dressing - set up/S    LB dressing - pants CG to steady for pulling over hips  Mobility:  SPT without AD CG to steady, CS for bed mobility  TA:  Completed various reaching activities/games in stance with CG/min A for balance - able to tolerate standing for 3 minutes x3, UB coordination activity with 1# dowel with good effort (stopped due to fatigue)  Patient with impaired recall of prior day but is able to list activities completed during session.  Returned to bed with alarm set at close of session.   Therapy Documentation Precautions:  Precautions Precautions: Fall Restrictions Weight Bearing Restrictions: No General:   Vital Signs:   Pain: Pain Assessment Pain Scale: 0-10 Pain Score: 0-No pain   Therapy/Group: Individual Therapy  Carlos Levering 11/07/2018, 12:19 PM

## 2018-11-07 NOTE — Progress Notes (Signed)
Recreational Therapy Session Note  Patient Details  Name: JAYTHAN HINELY MRN: [id number] Date of Birth: 06/05/1961 Today's Date: 11/07/2018 Time:  1330-1405 Pain: no c/o  Skilled Therapeutic Interventions/Progress Updates:  Goal:  Pt will maintain dynamic standing balance min assist during moderately complex tasks.  Attempted therapy session at 1300 as scheduled.  Pt in bed stating that he hadn't eaten, that he was lazy and didn't get up for lunch but not ready for it.  PT assisted pt to w/c for meal consumption with close supervision-meal set up provided.  Returned at 1330 and pt agreeable to participate in session.  Pt ambulated from room to therapy gym with min assist, mod cues for posture and safety.  Pt stood on Air Ex mat with mod assist for ball toss activity with posterior LOB requiring mod assist and mod verbal cues for correction.  Transitioned to standing on small wedge for balance with mod-max fading to contact guard assist for static standing balance.  Pt stood for ball toss on the wedge for ~5 tosses with min assist.  Discussed procedures for fall recovery at home and attempted floor transfer.  Pt required min assist to get on the floor and +2 assist to get up with mod cues and demonstration. Therapy/Group: Co-Treatment  Ashly Goethe 11/07/2018, 3:37 PM

## 2018-11-07 NOTE — Progress Notes (Signed)
Occupational Therapy Session Note  Patient Details  Name: [name] MRN: [id number] Date of Birth: 1961/10/05  Today's Date: 11/07/2018 OT Individual Time: 1500-1600 OT Individual Time Calculation (min): 60 min    Short Term Goals: Week 1:  OT Short Term Goal 1 (Week 1): STG=LTG d/t ELOS   Skilled Therapeutic Interventions/Progress Updates:    Upon entering the room, pt seated in wheelchair with no c/o pain. Pt required encouragement for participation this session. Pt ambulating with RW 200' towards BI gym with close supervision for safety. Pt taking seated rest break secondary to fatigue. Pt engaged in dynavision tasks while standing for 4 minutes and 2 minutes respectively. On second attempt, pt standing on 4 inch foam airex to increase balance challenge. Pt with no LOB during task but did need initially steady assist progressing to supervision without UE support. Pt returning to room in same manner with family member present. OT discussed pt's progress this session with family and exited the room. Pt returning to supine with supervision. Call bell and all needed items within reach.  Bed alarm activated.   Therapy Documentation Precautions:  Precautions Precautions: Fall Restrictions Weight Bearing Restrictions: No General:   Vital Signs: Therapy Vitals Temp: 97.9 F (36.6 C) Temp Source: Oral Pulse Rate: (!) 109 Resp: 14 BP: 139/81 Patient Position (if appropriate): Sitting Oxygen Therapy SpO2: 99 % O2 Device: Room Air Pain: Pain Assessment Pain Scale: 0-10 Pain Score: 0-No pain ADL: ADL Grooming: Minimal assistance Where Assessed-Grooming: Standing at sink Upper Body Bathing: Supervision/safety Where Assessed-Upper Body Bathing: Wheelchair Lower Body Bathing: Minimal assistance Where Assessed-Lower Body Bathing: Wheelchair Upper Body Dressing: Supervision/safety Where Assessed-Upper Body Dressing: Wheelchair Lower Body Dressing: Minimal assistance Where  Assessed-Lower Body Dressing: Wheelchair Toileting: Minimal assistance Where Assessed-Toileting: Glass blower/designer: Psychiatric nurse Method: Counselling psychologist: Grab bars   Therapy/Group: Individual Therapy  Gypsy Decant 11/07/2018, 4:16 PM

## 2018-11-07 NOTE — Progress Notes (Signed)
Speech Language Pathology Daily Session Note  Patient Details  Name: [name] MRN: [id number] Date of Birth: July 28, 1961  Today's Date: 11/07/2018 SLP Individual Time: 2751-7001 SLP Individual Time Calculation (min): 25 min  Short Term Goals: Week 1: SLP Short Term Goal 1 (Week 1): Pt will utilize speech intelligibility strategies to increase intelligibility at the sentence level to ~ 80% with supervision cues.  SLP Short Term Goal 2 (Week 1): Pt will express own thoughts with supervision cues for word finding strategies.   Skilled Therapeutic Interventions:  Pt was seen for skilled ST targeting communication goals.  Pt was able to return demonstration of both [name] and IMST devices (initiated during yesterday's therapy session) with supervision cues for recall of device use.  Pt completed 25 repetitions of each with a self perceived effort level of 5 out of 10.  Pt needed supervision cues to slow rate and increase vocal intensity to achieve intelligibility at the conversational level in a quiet environment.  Pt was left in wheelchair with seat belt alarm set and call bell within reach.  Continue per current plan of care.    Pain Pain Assessment Pain Scale: 0-10 Pain Score: 0-No pain  Therapy/Group: Individual Therapy  Lylee Corrow, Selinda Orion 11/07/2018, 12:32 PM

## 2018-11-07 NOTE — Patient Care Conference (Signed)
Inpatient RehabilitationTeam Conference and Plan of Care Update Date: 11/07/2018   Time: 10:10 AM    Patient Name: [name]      Medical Record Number: [id number]  Date of Birth: Dec 03, 1961 Sex: Male         Room/Bed: 4M04C/4M04C-01 Payor Info: Payor: Rosa [employer] EMPLOYEE / Plan: [employer] / Product Type: *No Product type* /    Admitting Diagnosis: myotonic dystropathy  Admit Date/Time:  11/02/2018  5:43 PM Admission Comments: No comment available   Primary Diagnosis:  <principal problem not specified> Principal Problem: <principal problem not specified>  Patient Active Problem List   Diagnosis Date Noted  . Myotonic dystrophy ([place])   . Abnormal chest x-ray   . Abnormal CT scan of lung   . Debility   . Leukopenia   . Encephalopathy 11/02/2018  . Acute metabolic encephalopathy 65/78/4696  . Pulmonary nodule 10/31/2018  . AKI (acute kidney injury) ([place]) 10/31/2018  . Generalized weakness 10/31/2018  . Fall 10/31/2018  . Weakness   . Aspiration pneumonia ([place]) 05/09/2017  . History of fall 05/09/2017  . Viral gastroenteritis 03/13/2014  . Autonomic dysfunction 09/05/2013  . Tachycardia 02/20/2013  . ADD (attention deficit disorder) 08/15/2012  . Depression 08/15/2012  . History of thyroid cancer   . Dizziness, nonspecific 12/07/2011  . Hypothyroidism 12/07/2011  . DEPRESSION 03/20/2008  . Myotonic muscular dystrophy ([place]) 03/20/2008  . GERD 03/20/2008  . SLEEP APNEA 03/20/2008    Expected Discharge Date: Expected Discharge Date: 11/10/18  Team Members Present: Physician leading conference: Dr. Delice Lesch Social Worker Present: Ovidio Kin, LCSW Nurse Present: Rayetta Pigg, RN PT Present: Kem Parkinson, PT OT Present: Willeen Cass, OT SLP Present: Windell Moulding, SLP PPS Coordinator present : Daiva Nakayama, RN, CRRN     Current Status/Progress Goal Weekly Team Focus  Medical   Debility and encephalopathy secondary to AKI/dehydration in a 57  yo male with myotonic dystrophy  Improve mobility, leukopenia, follow imaging, neurogenic bladder  See above   Bowel/Bladder   Cont/incont of bladder, continent of bowel; LBM 11/06/18  Continent of bowel and bladder with min assist  Assess bowel and bladder needs q shift and PRN    Swallow/Nutrition/ Hydration             ADL's   min A with LB self care and stand pivot transfers  supervision overall  ADL training, endurance. balance, pt education   Mobility   S stand pivot functional transfers, CGA gait without RW/S with RW, minA stairs with bilateral handrails  supervision overall  balance, LE strengthening, endurance, cognitive remediation   Communication   min assist-supervision   supervision   [name], education and carryover of compensatory strategies    Safety/Cognition/ Behavioral Observations            Pain   No c/o pain  Pain </= 2/10  Assess pain q shift and PRN and medicate as necessary   Skin   Fissure to coccyx (pink foam for protection) and MASD (using antifungal powder) to groin and buttocks significantly improved; no other skin issues  Remain free of infection and breakdown  Assess skin q shift and PRN and address any areas of concern      *See Care Plan and progress notes for long and short-term goals.     Barriers to Discharge  Current Status/Progress Possible Resolutions Date Resolved   Physician    Medical stability     See above  Therapies, follow labs, repeating chest  imaging as outpt      Nursing                  PT                    OT                  SLP                SW                Discharge Planning/Teaching Needs:  Home with wife and son who can provide supervision level. Pt wants to go home ASAP, wife wants him to stay and get the benefits from rehab      Team Discussion:  Goals supervision level-stable medically. Will follow up CT for chest incidental finding. High level balance and cognitive issues. Speech cognition and dysphagia at  baseline seeing for word finding and intelligibility. Skin issues nursing addressing.   Revisions to Treatment Plan:  DC 11/16    Continued Need for Acute Rehabilitation Level of Care: The patient requires daily medical management by a physician with specialized training in physical medicine and rehabilitation for the following conditions: Daily direction of a multidisciplinary physical rehabilitation program to ensure safe treatment while eliciting the highest outcome that is of practical value to the patient.: Yes Daily medical management of patient stability for increased activity during participation in an intensive rehabilitation regime.: Yes Daily analysis of laboratory values and/or radiology reports with any subsequent need for medication adjustment of medical intervention for : Other;Neurological problems   I attest that I was present, lead the team conference, and concur with the assessment and plan of the team.   Elease Hashimoto 11/07/2018, 2:04 PM

## 2018-11-07 NOTE — Progress Notes (Signed)
[facility] PHYSICAL MEDICINE & REHABILITATION PROGRESS NOTE   Subjective/Complaints: Patient seen laying in bed this morning.  He states he slept well overnight.  He has limited engagement.  ROS: Denies CP, SOB, N/V/D  Objective:   Dg Chest 2 View  Result Date: 11/05/2018 CLINICAL DATA:  Areas of linear atelectasis or scarring at both lung bases crash that abnormal right upper lobe finding on cervical spine CT. EXAM: CHEST - 2 VIEW COMPARISON:  Radiography 10/31/2018. CT 10/31/2018. Chest CT 09/03/2017. FINDINGS: Heart size is normal. There is linear scarring and/or atelectasis in both lower lungs. In the right lateral apex, there is an indistinct density corresponding to the findings at CT. This is nonspecific and could represent infiltrate, atelectasis or a mass. Plain radiographic follow-up or complete chest CT is suggested. IMPRESSION: Linear atelectasis or scarring at the lung bases. Indistinct density at the right apex laterally which is nonspecific. This could represent atelectasis, scar, infiltrate or developing mass. There is no abnormality in this location on the CT scan of the chest done September 2018. Followup is suggested. This could be done with radiography or chest CT. Electronically Signed   By: Nelson Chimes M.D.   On: 11/05/2018 12:36   Recent Labs    11/06/18 0508  WBC 3.7*  HGB 13.4  HCT 42.6  PLT 166   No results for input(s): NA, K, CL, CO2, GLUCOSE, BUN, CREATININE, CALCIUM in the last 72 hours.  Intake/Output Summary (Last 24 hours) at 11/07/2018 0922 Last data filed at 11/06/2018 2135 Gross per 24 hour  Intake 610 ml  Output -  Net 610 ml     Physical Exam: Vital Signs Blood pressure 118/78, pulse 78, temperature 98.3 F (36.8 C), temperature source Oral, resp. rate 19, height 5\' 5"  (1.651 m), weight 92.3 kg, SpO2 100 %.   Constitutional: No distress . Vital signs reviewed. HENT: Normocephalic.  Atraumatic. Eyes: EOMI. No discharge. Cardiovascular:  RRR.  No JVD. Respiratory: CTA bilaterally.  Normal effort. GI: BS +. Non-distended. Musc: No edema or tenderness in extremities. Neurologic: Alert and oriented. Dysarthria Motor: 5/5 in bilateral deltoid, bicep, tricep, grip, hip flexor, knee extensors, ankle dorsiflexor and plantar flexor Skin: Warm and dry.  Intact.  Assessment/Plan: 1. Functional deficits secondary to Myotonic dystrophy with falls which require 3+ hours per day of interdisciplinary therapy in a comprehensive inpatient rehab setting.  Physiatrist is providing close team supervision and 24 hour management of active medical problems listed below.  Physiatrist and rehab team continue to assess barriers to discharge/monitor patient progress toward functional and medical goals  Care Tool:  Bathing    Body parts bathed by patient: Right arm, Left arm, Chest, Abdomen, Front perineal area, Right upper leg, Left upper leg, Face   Body parts bathed by helper: Buttocks, Right lower leg, Left lower leg     Bathing assist Assist Level: Minimal Assistance - Patient > 75%     Upper Body Dressing/Undressing Upper body dressing   What is the patient wearing?: Pull over shirt    Upper body assist Assist Level: Supervision/Verbal cueing    Lower Body Dressing/Undressing Lower body dressing      What is the patient wearing?: Underwear/pull up, Pants     Lower body assist Assist for lower body dressing: Moderate Assistance - Patient 50 - 74%     Toileting Toileting    Toileting assist Assist for toileting: Minimal Assistance - Patient > 75%     Transfers Chair/bed transfer  Transfers assist  Chair/bed transfer assist level: Supervision/Verbal cueing     Locomotion Ambulation   Ambulation assist      Assist level: Minimal Assistance - Patient > 75% Assistive device: Walker-rolling Max distance: 100 ft    Walk 10 feet activity   Assist     Assist level: Minimal Assistance - Patient >  75% Assistive device: Walker-rolling   Walk 50 feet activity   Assist    Assist level: Minimal Assistance - Patient > 75% Assistive device: Walker-rolling    Walk 150 feet activity   Assist Walk 150 feet activity did not occur: Safety/medical concerns  Assist level: Supervision/Verbal cueing Assistive device: Walker-rolling    Walk 10 feet on uneven surface  activity   Assist Walk 10 feet on uneven surfaces activity did not occur: Safety/medical concerns         Wheelchair     Assist Will patient use wheelchair at discharge?: No Type of Wheelchair: Manual    Wheelchair assist level: Contact Guard/Touching assist Max wheelchair distance: 45'    Wheelchair 50 feet with 2 turns activity    Assist        Assist Level: Contact Guard/Touching assist   Wheelchair 150 feet activity     Assist Wheelchair 150 feet activity did not occur: Safety/medical concerns(fatigue)        Medical Problem List and Plan: 1.Debility and encephalopathysecondary to AKI/dehydration in a 57 yo male with myotonic dystrophy  Continue CIR  Team conference today to discuss current and goals and coordination of care, home and environmental barriers, and discharge planning with nursing, case manager, and therapies.  2. DVT Prophylaxis/Anticoagulation: Pharmaceutical:Lovenox, platelets within normal limits 3. Pain Management:Tylenol as needed 4. Mood:LCSW to follow for evaluation and support 5. Neuropsych: This patientis ?not fullycapable of making decisions on hisown behalf. 6. Skin/Wound Care:Routine pressure relief measures 7. Fluids/Electrolytes/Nutrition:Monitor I's and O's.   BMET within normal limits on 11/9 8.Myotonic dystrophy: With reports of progression. Respiratory status stable 9.ADHD: Continue Adderall 3 times daily 10.Thrombocytopenia: Resolved  Platelets 163 on 11/9 11.?  Lung nodule  Repeat imaging ~01/2019  Chest x-ray reviewed  11/11, reviewed right apex abnormality 12.  Leukopenia  WBC 3.7 on 11/12  Cont to monitor  LOS:5 days A FACE TO FACE EVALUATION WAS PERFORMED  Harika Laidlaw Lorie Phenix 11/07/2018, 9:22 AM

## 2018-11-07 NOTE — Discharge Summary (Addendum)
Physician Discharge Summary  Patient ID: [name] MRN: [id number] DOB/AGE: 58-Feb-1962 58 y.o.  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 EXAM: CHEST - 2 VIEW COMPARISON:  Radiography 10/31/2018. CT 10/31/2018. Chest CT 09/03/2017. FINDINGS: Heart size is normal. There is linear scarring and/or atelectasis in both lower lungs. In the right lateral apex, there is an indistinct density corresponding to the findings at CT. This is nonspecific and could represent infiltrate, atelectasis or a mass. Plain radiographic follow-up or complete chest CT is suggested. IMPRESSION: Linear atelectasis or scarring at the lung bases. Indistinct density at the right apex laterally which is nonspecific. This could represent atelectasis, scar, infiltrate or developing mass. There is no abnormality in this location on the CT scan of the chest done September 2018. Followup is suggested. This could be done with radiography or chest CT. Electronically Signed   By: Nelson Chimes M.D.   On: 11/05/2018 12:36   Ct Head Wo Contrast  Result Date: 10/31/2018 CLINICAL DATA:  Head trauma. EXAM: CT HEAD WITHOUT CONTRAST CT CERVICAL SPINE WITHOUT CONTRAST TECHNIQUE: Multidetector CT imaging of the head and cervical spine was performed following the standard protocol without intravenous contrast. Multiplanar CT image reconstructions of the cervical spine were also generated. COMPARISON:  MRI brain 07/15/2011. CT head 07/15/2011. CT head and cervical spine 07/08/2011 FINDINGS: CT HEAD FINDINGS  Brain: Diffuse cerebral atrophy. Ventricular dilatation consistent with central atrophy. Low-attenuation changes in the deep white matter consistent with small vessel ischemia. No mass-effect or midline shift. No abnormal extra-axial fluid collections. Gray-white matter junctions are distinct. Basal cisterns are not effaced. No acute intracranial hemorrhage. Vascular: No hyperdense vessel or unexpected calcification. Skull: Normal. Negative for fracture or focal lesion. Sinuses/Orbits: Mucosal thickening in the paranasal sinuses. No acute air-fluid levels. Mastoid air cells are clear. Other: None. CT CERVICAL SPINE FINDINGS Alignment: Normal alignment of the cervical vertebrae and facet joints. C1-2 articulation appears intact. Skull base and vertebrae: Skull base is intact. No vertebral compression deformities. No focal bone lesion or bone destruction. Bone cortex appears intact. Soft tissues and spinal canal: No paraspinal soft tissue mass or infiltration. No prevertebral soft tissue swelling. Disc levels:  Intervertebral disc space heights are preserved. Upper chest: Several nodular opacities in the right lung apex are new since previous study and may represent nodular areas of infiltration. Largest measures about 6 x 10 mm. Recommend three-month follow-up to exclude pulmonary nodule. Surgical clips in the base of the neck. Other: None. IMPRESSION: 1. No acute intracranial abnormalities. Chronic atrophy and small vessel ischemic changes. 2. Normal alignment of the cervical spine. No acute displaced fractures identified. 3. Several nodular opacities in the right lung apex are new since previous study and may represent nodular areas of infiltration. Recommend three-month follow-up to exclude pulmonary nodule. Electronically Signed   By: Lucienne Capers M.D.   On: 10/31/2018 02:11   Ct Cervical Spine Wo Contrast  Result Date: 10/31/2018 CLINICAL DATA:  Head trauma. EXAM: CT HEAD WITHOUT CONTRAST CT CERVICAL  SPINE WITHOUT CONTRAST TECHNIQUE: Multidetector CT imaging of the  head and cervical spine was performed following the standard protocol without intravenous contrast. Multiplanar CT image reconstructions of the cervical spine were also generated. COMPARISON:  MRI brain 07/15/2011. CT head 07/15/2011. CT head and cervical spine 07/08/2011 FINDINGS: CT HEAD FINDINGS Brain: Diffuse cerebral atrophy. Ventricular dilatation consistent with central atrophy. Low-attenuation changes in the deep white matter consistent with small vessel ischemia. No mass-effect or midline shift. No abnormal extra-axial fluid collections. Gray-white matter junctions are distinct. Basal cisterns are not effaced. No acute intracranial hemorrhage. Vascular: No hyperdense vessel or unexpected calcification. Skull: Normal. Negative for fracture or focal lesion. Sinuses/Orbits: Mucosal thickening in the paranasal sinuses. No acute air-fluid levels. Mastoid air cells are clear. Other: None. CT CERVICAL SPINE FINDINGS Alignment: Normal alignment of the cervical vertebrae and facet joints. C1-2 articulation appears intact. Skull base and vertebrae: Skull base is intact. No vertebral compression deformities. No focal bone lesion or bone destruction. Bone cortex appears intact. Soft tissues and spinal canal: No paraspinal soft tissue mass or infiltration. No prevertebral soft tissue swelling. Disc levels:  Intervertebral disc space heights are preserved. Upper chest: Several nodular opacities in the right lung apex are new since previous study and may represent nodular areas of infiltration. Largest measures about 6 x 10 mm. Recommend three-month follow-up to exclude pulmonary nodule. Surgical clips in the base of the neck. Other: None. IMPRESSION: 1. No acute intracranial abnormalities. Chronic atrophy and small vessel ischemic changes. 2. Normal alignment of the cervical spine. No acute displaced fractures identified. 3. Several nodular opacities in  the right lung apex are new since previous study and may represent nodular areas of infiltration. Recommend three-month follow-up to exclude pulmonary nodule. Electronically Signed   By: Lucienne Capers M.D.   On: 10/31/2018 02:11   Dg Chest Port 1 View  Result Date: 10/31/2018 CLINICAL DATA:  Fall. EXAM: PORTABLE CHEST 1 VIEW COMPARISON:  CT chest 09/03/2017 FINDINGS: Shallow inspiration with linear atelectasis or fibrosis in the lung bases. No consolidation or airspace disease. No blunting of costophrenic angles. No pneumothorax. Mediastinal contours appear intact. Normal heart size and pulmonary vascularity. IMPRESSION: Shallow inspiration with linear atelectasis or fibrosis in the lung bases. Electronically Signed   By: Lucienne Capers M.D.   On: 10/31/2018 01:42    Labs:  Basic Metabolic Panel: BMP Latest Ref Rng & Units 11/03/2018 10/31/2018 10/31/2018  Glucose 70 - 99 mg/dL 81 79 96  BUN 6 - 20 mg/dL 8 8 8   Creatinine 0.61 - 1.24 mg/dL 1.02 1.18 1.54(H)  Sodium 135 - 145 mmol/L 138 137 132(L)  Potassium 3.5 - 5.1 mmol/L 4.1 3.5 3.7  Chloride 98 - 111 mmol/L 101 102 98  CO2 22 - 32 mmol/L 27 26 26   Calcium 8.9 - 10.3 mg/dL 9.4 9.2 9.5    CBC: CBC Latest Ref Rng & Units 11/06/2018 11/03/2018 10/31/2018  WBC 4.0 - 10.5 K/uL 3.7(L) 3.3(L) 5.4  Hemoglobin 13.0 - 17.0 g/dL 13.4 13.9 13.4  Hematocrit 39.0 - 52.0 % 42.6 43.8 43.9  Platelets 150 - 400 K/uL 166 163 140(L)    CBG: No results for input(s): GLUCAP in the last 168 hours.  Brief HPI:    [name] is a 57 year old male with history of myotonic dystrophy with progressive weakness, dysphagia and expressive deficits, chronic respiratory failure, ADHD, fatty liver; who was admitted from home with generalized weakness, multiple falls with difficulty getting off the floor as well as confusion.  UDS was negative.  He was noted to be  dehydrated and treated with fluid boluses with improvement in mentation.  Work-up negative for  infection or acute changes and encephalopathy felt to be due to AKI due to dehydration.   Therapy evaluations done revealing cognitive deficits with poor safety awareness as well as deficits in mobility and ability to carry out ADLs.  CIR recommended for follow-up therapy     Hospital Course: [name] was admitted to rehab 11/02/2018 for inpatient therapies to consist of PT, ST and OT at least three hours five days a week. Past admission physiatrist, therapy team and rehab RN have worked together to provide customized collaborative inpatient rehab.  P.o. intake has been good and he is tolerating regular diet without difficulty.  He is continent of bowel and bladder.  He was maintained on Adderall 3 times daily for ADHD.  Follow-up CBC revealed leukopenia with white count of 3.7 and thrombocytopenia has resolved.  He will need repeat CBC on outpatient basis. He is showing improvement in safety awareness, mobility and verbal output. He will continue to receive follow up HHPT and [place] [facility] after discharge.    Rehab course: During patient's stay in rehab weekly team conferences were held to monitor patient's progress, set goals and discuss barriers to discharge. At admission, patient required min assist for basic ADLs and mobility. He exhibited decrease speech intelligibility with imprecise speech and word finding deficits.   He has had improvement in activity tolerance, balance, postural control as well as ability to compensate for deficits.  He is able to complete ADL tasks with supervision. He requires supervision for transfers and is able to ambulate 400' with RW and cues for safety.  He requires supervision for word finding deficits and to use compensatory strategies for speech intelligibility.  He is able to demonstrate use of IMST and EMST devices.  He is currently at baseline for cognition, swallow and communication.     Disposition: Home  Diet: Regular  Special  Instructions: 1.  Will need CT of the chest repeated 01/2019 2.  Needs 24-hour supervision.   Discharge Instructions    Ambulatory referral to Physical Medicine Rehab   Complete by:  As directed    1-2 weeks transitional care appt     Allergies as of 11/10/2018   No Known Allergies     Medication List    TAKE these medications   acetaminophen 325 MG tablet Commonly known as:  TYLENOL Take 1-2 tablets (325-650 mg total) by mouth every 4 (four) hours as needed for mild pain. What changed:    medication strength  how much to take  when to take this  reasons to take this   amphetamine-dextroamphetamine 20 MG tablet Commonly known as:  ADDERALL 1 tab po tid What changed:    how much to take  how to take this  when to take this  additional instructions   FLUoxetine 20 MG tablet Commonly known as:  PROZAC TAKE 1 TABLET BY MOUTH ONCE DAILY   levothyroxine 150 MCG tablet Commonly known as:  SYNTHROID, LEVOTHROID TAKE 1 TABLET BY MOUTH ONCE DAILY EXCEPT FOR WEDNESDAY  SKIP DOSE What changed:    how much to take  how to take this  when to take this  additional instructions   omeprazole 40 MG capsule Commonly known as:  PRILOSEC Take 1 capsule (40 mg total) by mouth daily.   traZODone 100 MG tablet Commonly known as:  DESYREL TAKE 1 TABLET BY MOUTH AT BEDTIME AS NEEDED  FOR SLEEP. What changed:    reasons to take this  additional instructions      Follow-up Information    Jamse Arn, MD Follow up.   Specialty:  Physical Medicine and Rehabilitation Why:  office will call you with follow up appointment Contact information: [place] [address] [phone]        Tammi Sou, MD Follow up on 11/16/2018.   Specialty:  Family Medicine Why:  For post hospital follow up.   Appointment @ 11:00 am Contact information: [address] [place] [address] [phone]           Signed: Bary Leriche 11/12/2018, 6:44 PM  Patient seen and examined by me on day of discharge. Delice Lesch, MD, ABPMR

## 2018-11-08 ENCOUNTER — Inpatient Hospital Stay (HOSPITAL_COMMUNITY): Payer: 59 | Admitting: Physical Therapy

## 2018-11-08 ENCOUNTER — Inpatient Hospital Stay (HOSPITAL_COMMUNITY): Payer: 59 | Admitting: Speech Pathology

## 2018-11-08 ENCOUNTER — Inpatient Hospital Stay (HOSPITAL_COMMUNITY): Payer: 59

## 2018-11-08 ENCOUNTER — Inpatient Hospital Stay (HOSPITAL_COMMUNITY): Payer: 59 | Admitting: Occupational Therapy

## 2018-11-08 NOTE — Progress Notes (Signed)
[name] PHYSICAL MEDICINE & REHABILITATION PROGRESS NOTE   Subjective/Complaints: Patient seen laying in bed this morning.  He states he slept well overnight.  ROS: Denies CP, SOB, N/V/D  Objective:   No results found. Recent Labs    11/06/18 0508  WBC 3.7*  HGB 13.4  HCT 42.6  PLT 166   No results for input(s): NA, K, CL, CO2, GLUCOSE, BUN, CREATININE, CALCIUM in the last 72 hours.  Intake/Output Summary (Last 24 hours) at 11/08/2018 0841 Last data filed at 11/07/2018 2040 Gross per 24 hour  Intake 660 ml  Output -  Net 660 ml     Physical Exam: Vital Signs Blood pressure (!) 84/60, pulse 81, temperature 98.7 F (37.1 C), resp. rate 17, height 5\' 5"  (1.651 m), weight 92.3 kg, SpO2 91 %.   Constitutional: No distress . Vital signs reviewed. HENT: Normocephalic.  Atraumatic. Eyes: EOMI. No discharge. Cardiovascular: RRR.  No JVD. Respiratory: CTA bilaterally.  Normal effort. GI: BS +. Non-distended. Musc: No edema or tenderness in extremities. Neurologic: Alert and oriented. Dysarthria Motor: 5/5 grossly throughout Skin: Warm and dry.  Intact.  Assessment/Plan: 1. Functional deficits secondary to Myotonic dystrophy with falls which require 3+ hours per day of interdisciplinary therapy in a comprehensive inpatient rehab setting.  Physiatrist is providing close team supervision and 24 hour management of active medical problems listed below.  Physiatrist and rehab team continue to assess barriers to discharge/monitor patient progress toward functional and medical goals  Care Tool:  Bathing    Body parts bathed by patient: Right arm, Left arm, Chest, Abdomen, Front perineal area, Right upper leg, Left upper leg, Face   Body parts bathed by helper: Buttocks, Right lower leg, Left lower leg     Bathing assist Assist Level: Minimal Assistance - Patient > 75%     Upper Body Dressing/Undressing Upper body dressing   What is the patient wearing?: Pull over  shirt    Upper body assist Assist Level: Supervision/Verbal cueing    Lower Body Dressing/Undressing Lower body dressing      What is the patient wearing?: Pants     Lower body assist Assist for lower body dressing: Contact Guard/Touching assist     Toileting Toileting    Toileting assist Assist for toileting: Minimal Assistance - Patient > 75%     Transfers Chair/bed transfer  Transfers assist     Chair/bed transfer assist level: Supervision/Verbal cueing     Locomotion Ambulation   Ambulation assist      Assist level: Contact Guard/Touching assist Assistive device: Walker-rolling Max distance: 200   Walk 10 feet activity   Assist     Assist level: Contact Guard/Touching assist Assistive device: Walker-rolling   Walk 50 feet activity   Assist    Assist level: Contact Guard/Touching assist Assistive device: Walker-rolling    Walk 150 feet activity   Assist Walk 150 feet activity did not occur: Safety/medical concerns  Assist level: Contact Guard/Touching assist Assistive device: Walker-rolling    Walk 10 feet on uneven surface  activity   Assist Walk 10 feet on uneven surfaces activity did not occur: Safety/medical concerns         Wheelchair     Assist Will patient use wheelchair at discharge?: No Type of Wheelchair: Manual    Wheelchair assist level: Contact Guard/Touching assist Max wheelchair distance: 39'    Wheelchair 50 feet with 2 turns activity    Assist        Assist Level: Contact  Guard/Touching assist   Wheelchair 150 feet activity     Assist Wheelchair 150 feet activity did not occur: Safety/medical concerns(fatigue)        Medical Problem List and Plan: 1.Debility and encephalopathysecondary to AKI/dehydration in a 57 yo male with myotonic dystrophy  Continue CIR 2. DVT Prophylaxis/Anticoagulation: Pharmaceutical:Lovenox 3. Pain Management:Tylenol as needed 4. Mood:LCSW to  follow for evaluation and support 5. Neuropsych: This patientis ?not fullycapable of making decisions on hisown behalf. 6. Skin/Wound Care:Routine pressure relief measures 7. Fluids/Electrolytes/Nutrition:Monitor I's and O's.   BMET within normal limits on 11/9  Labs ordered for tomorrow 8.Myotonic dystrophy: With reports of progression. Respiratory status stable 9.ADHD: Continue Adderall 3 times daily  Stable 10.Thrombocytopenia: Resolved  Platelets 163 on 11/9 11.?  Lung nodule  Repeat imaging ~01/2019  Chest x-ray reviewed 11/11, reviewed right apex abnormality 12.  Leukopenia  WBC 3.7 on 11/12  Cont to monitor  LOS:6 days A FACE TO FACE EVALUATION WAS PERFORMED  [name] [name] 11/08/2018, 8:41 AM

## 2018-11-08 NOTE — Progress Notes (Signed)
Recreational Therapy Discharge Summary Patient Details  Name: [name] MRN: [id number] Date of Birth: [birth year] Today's Date: 11/08/2018  Comments on progress toward goals: Pt is scheduled for discharge home 11/16 with wife at overall supervision level.  TR sessions focused on leisure inventory, activity analysis with potential modifications, activity tolerance & dynamic standing balance.  PT stated limited interests in leisure PTA stating he didn't have the energy to do much.  Reviewed his interests on eval, other activities to provide life satisfaction and available resources for leisure participation, specifically the Wii as pt reports he has one at home.     Reasons for discharge: discharge from hospital Patient/family agrees with progress made and goals achieved: Yes  [name] 11/08/2018, 11:47 AM

## 2018-11-08 NOTE — Progress Notes (Signed)
Physical Therapy Session Note  Patient Details  Name: [name] MRN: [id number] Date of Birth: 26-Oct-1961  Today's Date: 11/08/2018 PT Individual Time: 1100-1145 PT Individual Time Calculation (min): 45 min   Short Term Goals: Week 1:  PT Short Term Goal 1 (Week 1): =LTGs due to ELOS  Skilled Therapeutic Interventions/Progress Updates:    Session focused on discussion in regards to d/c planning and goals, overall endurance and functional strengthening, gait with RW, and NMR for dynamic standing balance during dual task activity. Pt performing functional mobility overall supervision to CGA with RW (pt request to practice with RW as he plans to use this at home) including gait >150' and functional transfers. Cues for hand placement and technique during transfers for improved safety. NMR for balance retraining on compliant and non compliant surface while performing [employer] with task to use R hand for red colors and L for green to challenge balance and cognition. On compliant surface requires up to min assist to correct for LOB posteriorly. Supervision with non compliant surface and no UE support. Pt did demonstrate some difficulty keeping up with R and L to match appropriate color. End of session left in care of RN to assist with lunch set up.   Therapy Documentation Precautions:  Precautions Precautions: Fall Restrictions Weight Bearing Restrictions: No  Pain:  No complaints of pain.    Therapy/Group: Individual Therapy  Canary Brim Ivory Broad, PT, DPT, CBIS  11/08/2018, 11:58 AM

## 2018-11-08 NOTE — Progress Notes (Signed)
Social Work Patient ID: [name], male   DOB: 18-Mar-1961, 57 y.o.   MRN: [id number]  Met with pt and wife to discuss team conference goals supervision level and target discharge date 11/16. He is very pleased with this date due to he is ready to go home. Disucssed follow up pt would like home health sicne he is too tired by the time he arrives at OP. No pref will set up with [facility]. Both pleased with his progress and want it to continue, pt aware he will need to keep moving.

## 2018-11-08 NOTE — Progress Notes (Signed)
Speech Language Pathology Daily Session Note  Patient Details  Name: [name] MRN: [id number] Date of Birth: [birth year]  Today's Date: 11/08/2018 SLP Individual Time: 1035-1100; 1303-1400 SLP Individual Time Calculation (min): 25 min; 57 min  Short Term Goals: Week 1: SLP Short Term Goal 1 (Week 1): Pt will utilize speech intelligibility strategies to increase intelligibility at the sentence level to ~ 80% with supervision cues.  SLP Short Term Goal 2 (Week 1): Pt will express own thoughts with supervision cues for word finding strategies.   Skilled Therapeutic Interventions:  Session 1:  Pt was seen for skilled ST targeting communication goals.  Pt was able to complete 25 repetitions of EMST and IMST with a self perceived effort level of <5/10 and only x1 supervision cues for correct device use.  SLP reviewed and reinforced speech intelligibility strategies in anticipation of this afternoon's therapy session.   Session 2:  Pt was seen for skilled ST targeting communication goals.  SLP facilitated the session with a novel game targeting carryover of word finding and intelligibility strategies.  Pt needed min assist verbal cues to increase vocal intensity to achieve intelligibility at the conversational level.  Pt needed supervision cues for word finding during task.  Pt was returned to room and left in bed with bed alarm set and all needs within reach.  Continue per current plan of care.    Pain Pain Assessment Pain Scale: 0-10 Pain Score: 0-No pain  Therapy/Group: Individual Therapy  Quasean Frye, Selinda Orion 11/08/2018, 12:21 PM

## 2018-11-08 NOTE — Progress Notes (Signed)
Occupational Therapy Session Note  Patient Details  Name: [name] MRN: [id number] Date of Birth: 06/10/1961  Today's Date: 11/08/2018 OT Individual Time: 0076-2263 OT Individual Time Calculation (min): 62 min    Short Term Goals: Week 1:  OT Short Term Goal 1 (Week 1): STG=LTG d/t ELOS   Skilled Therapeutic Interventions/Progress Updates:    Patient in bed upon arrival, denies pain, states that he would rather sleep but is agreeable to get up and complete shower.   ADL:  Shower - min A (buttock, B lower legs)  UB dressing - set up  LB dressing - min A pants/pullup, dep slipper socks  Eating - set up  Toileting - CS (linens changed as patient was incontinent of urine at hs) Functional mobility/transfers - ambulation in room and SPT to/from bed, recliner, toilet, shower bench with CG/CS cues for balance at times. Patient talkative about current events and sports.  He returned to recliner at close of session with seat belt alarm set and items within reach.  Therapy Documentation Precautions:  Precautions Precautions: Fall Restrictions Weight Bearing Restrictions: No General:   Vital Signs:  Pain: Pain Assessment Pain Scale: 0-10 Pain Score: 0-No pain   Therapy/Group: Individual Therapy  Carlos Levering 11/08/2018, 12:10 PM

## 2018-11-09 ENCOUNTER — Inpatient Hospital Stay (HOSPITAL_COMMUNITY): Payer: 59 | Admitting: Speech Pathology

## 2018-11-09 ENCOUNTER — Inpatient Hospital Stay (HOSPITAL_COMMUNITY): Payer: 59 | Admitting: Occupational Therapy

## 2018-11-09 ENCOUNTER — Inpatient Hospital Stay (HOSPITAL_COMMUNITY): Payer: 59 | Admitting: Physical Therapy

## 2018-11-09 NOTE — Progress Notes (Signed)
Social Work  Discharge Note  The overall goal for the admission was met for: DC SAT 11/16  Discharge location: Yes-HOME WITH WIFE AND SON WHO CAN PROVIDE 24 HR SUPERVISION  Length of Stay: Yes-8 DAYS  Discharge activity level: Yes-SUPERVISION LEVEL  Home/community participation: Yes  Services provided included: MD, RD, PT, OT, RN, CM, TR, Pharmacy and SW  Financial Services: Private Insurance: [employer]  Follow-up services arranged: Home Health: [place], DME: [employer] and Patient/Family has no preference for HH/DME agencies  Comments (or additional information):WIFE AND SON [place]. WIFE WANTED HIM TO GET STRONGER HERE AND HE HAS HE NEEDS TO CONTINUE MOVING ONCE HOME.  Patient/Family verbalized understanding of follow-up arrangements: Yes  Individual responsible for coordination of the follow-up plan: NANCY-WIFE  Confirmed correct DME delivered: Elease Hashimoto 11/09/2018    Elease Hashimoto

## 2018-11-09 NOTE — Discharge Instructions (Signed)
Inpatient Rehab Discharge Instructions  [name] Discharge date and time:  11/10/18  Activities/Precautions/ Functional Status: Activity: no lifting, driving, or strenuous exercise till cleared by MD Diet: regular diet Wound Care: none needed   Functional status:  ___ No restrictions     ___ Walk up steps independently _X__ 24/7 supervision/assistance   ___ Walk up steps with assistance ___ Intermittent supervision/assistance  ___ Bathe/dress independently ___ Walk with walker     _X__ Bathe/dress with assistance ___ Walk Independently    ___ Shower independently _X__ Walk with supervision     ___ Shower with assistance _X__ No alcohol     ___ Return to work/school ________  Special Instructions: 1. Will need repeat CT chest 01/2009 for follow up right lung nodules.   COMMUNITY REFERRALS UPON DISCHARGE:    Home Health:   PT     [facility]   Date of last service:11/10/2018  Medical Equipment/Items [facility]   [phone]   My questions have been answered and I understand these instructions. I will adhere to these goals and the provided educational materials after my discharge from the hospital.  Patient/Caregiver Signature _______________________________ Date __________  Clinician Signature _______________________________________ Date __________  Please bring this form and your medication list with you to all your follow-up doctor's appointments.

## 2018-11-09 NOTE — Progress Notes (Signed)
Physical Therapy Discharge Summary  Patient Details  Name: [name] MRN: [id number] Date of Birth: [birth year]  Today's Date: 11/09/2018 PT Individual Time: 1000-1110 PT Individual Time Calculation (min): 70 min    Patient has met 10 of 10 long term goals due to improved activity tolerance, improved balance, improved postural control, increased strength, ability to compensate for deficits and improved coordination.  Patient to discharge at an ambulatory level Supervision.   Patient's care partner is independent to provide the necessary physical and cognitive assistance at discharge; pt's wife/son did not participate in therapy sessions during this admission however per report pt is at baseline mobility and family was providing S prior to admission.  Reasons goals not met: All goals met  Recommendation:  Patient will benefit from ongoing skilled PT services in home health setting to continue to advance safe functional mobility, address ongoing impairments in ROM, strength, activity tolerance, balance, and minimize fall risk.  Equipment: RW  Reasons for discharge: treatment goals met and discharge from hospital  Patient/family agrees with progress made and goals achieved: Yes  PT Discharge Precautions/Restrictions Precautions Precautions: Fall Restrictions Weight Bearing Restrictions: No Pain Pain Assessment Pain Scale: 0-10 Pain Score: 0-No pain Vision/Perception  Perception Perception: Within Functional Limits Praxis Praxis: Intact  Cognition Overall Cognitive Status: History of cognitive impairments - at baseline Arousal/Alertness: Awake/alert Orientation Level: Oriented X4 Attention: Sustained;Selective Sustained Attention: Appears intact Selective Attention: Appears intact Selective Attention Impairment: Verbal basic;Functional basic Memory: Impaired Memory Impairment: Decreased recall of new information Decreased Short Term Memory: Verbal basic;Functional  basic Awareness: Impaired Awareness Impairment: Emergent impairment Problem Solving: Impaired Problem Solving Impairment: Verbal basic Safety/Judgment: Impaired Sensation Sensation Light Touch: Appears Intact Coordination Fine Motor Movements are Fluid and Coordinated: No Heel Shin Test: WNL Motor  Motor Motor - Discharge Observations: mild ataxia, generalized weakness distally>proximally d/t hx of myotonic dystrophy  Mobility Bed Mobility Bed Mobility: Rolling Left;Rolling Right;Supine to Sit;Sit to Supine Rolling Right: Independent Rolling Left: Independent Supine to Sit: Supervision/Verbal cueing Sit to Supine: Supervision/Verbal cueing Transfers Transfers: Sit to Stand;Stand Pivot Transfers Sit to Stand: Supervision/Verbal cueing Stand to Sit: Supervision/Verbal cueing Stand Pivot Transfers: Supervision/Verbal cueing Stand Pivot Transfer Details: Verbal cues for precautions/safety Locomotion  Gait Ambulation: Yes Gait Assistance: Supervision/Verbal cueing Gait Distance (Feet): 400 Feet Assistive device: Rolling walker Gait Assistance Details: Verbal cues for precautions/safety;Verbal cues for safe use of DME/AE Gait Assistance Details: cues for maintaining RW closer to body Gait Gait: Yes Gait Pattern: Impaired Gait Pattern: Poor foot clearance - right;Poor foot clearance - left;Right foot flat;Left foot flat;Shuffle;Trunk flexed;Decreased stride length Gait velocity: 2.0 ft/sec; 2 min walk test 180' with RW; unable to complete 6 min walk test, ended at 4 min 30 sec with total 400'. Stairs / Additional Locomotion Stairs: Yes Stairs Assistance: Supervision/Verbal cueing Stair Management Technique: Two rails;Alternating pattern;Forwards Number of Stairs: 12 Height of Stairs: 6 Ramp: Supervision/Verbal cueing Curb: Supervision/Verbal cueing Wheelchair Mobility Wheelchair Mobility: No  Trunk/Postural Assessment  Cervical Assessment Cervical Assessment: Exceptions  to WFL(forward head) Thoracic Assessment Thoracic Assessment: Within Functional Limits Lumbar Assessment Lumbar Assessment: Within Functional Limits Postural Control Postural Control: Deficits on evaluation Righting Reactions: absent ankle strategy, delayed stepping reactions  Balance Balance Balance Assessed: Yes Standardized Balance Assessment Standardized Balance Assessment: Functional Gait Assessment;Timed Up and Go Test Timed Up and Go Test TUG: Normal TUG Normal TUG (seconds): 18(18 sec no AD, 27 sec with RW) Static Sitting Balance Static Sitting - Balance Support: Feet supported;No upper extremity supported Static  Sitting - Level of Assistance: 6: Modified independent (Device/Increase time) Dynamic Sitting Balance Dynamic Sitting - Balance Support: No upper extremity supported;Feet supported Dynamic Sitting - Level of Assistance: 6: Modified independent (Device/Increase time) Static Standing Balance Static Standing - Balance Support: No upper extremity supported;During functional activity Static Standing - Level of Assistance: 6: Modified independent (Device/Increase time) Dynamic Standing Balance Dynamic Standing - Balance Support: No upper extremity supported;During functional activity Dynamic Standing - Level of Assistance: 5: Stand by assistance Functional Gait  Assessment Gait assessed : Yes(with RW) Gait Level Surface: Walks 20 ft in less than 7 sec but greater than 5.5 sec, uses assistive device, slower speed, mild gait deviations, or deviates 6-10 in outside of the 12 in walkway width. Change in Gait Speed: Able to change speed, demonstrates mild gait deviations, deviates 6-10 in outside of the 12 in walkway width, or no gait deviations, unable to achieve a major change in velocity, or uses a change in velocity, or uses an assistive device. Gait with Horizontal Head Turns: Performs head turns smoothly with slight change in gait velocity (eg, minor disruption to smooth  gait path), deviates 6-10 in outside 12 in walkway width, or uses an assistive device. Gait with Vertical Head Turns: Performs task with moderate change in gait velocity, slows down, deviates 10-15 in outside 12 in walkway width but recovers, can continue to walk. Gait and Pivot Turn: Pivot turns safely in greater than 3 sec and stops with no loss of balance, or pivot turns safely within 3 sec and stops with mild imbalance, requires small steps to catch balance. Step Over Obstacle: Is able to step over one shoe box (4.5 in total height) but must slow down and adjust steps to clear box safely. May require verbal cueing. Gait with Narrow Base of Support: Is able to ambulate for 10 steps heel to toe with no staggering. Gait with Eyes Closed: Walks 20 ft, slow speed, abnormal gait pattern, evidence for imbalance, deviates 10-15 in outside 12 in walkway width. Requires more than 9 sec to ambulate 20 ft. Ambulating Backwards: Walks 20 ft, uses assistive device, slower speed, mild gait deviations, deviates 6-10 in outside 12 in walkway width. Steps: Alternating feet, must use rail. Total Score: 18 Extremity Assessment  RUE Assessment RUE Assessment: Exceptions to [facility] Active Range of Motion (AROM) Comments: opposition intact with effort General Strength Comments: shoulder 3/5, biceps 4/5, triceps 4-/5, wrist 4/5, grip (with Jamar) #3 15# LUE Assessment LUE Assessment: Exceptions to [facility] Active Range of Motion (AROM) Comments: shoulder 0-125, digit IP flexion limited, opposition intact with effort, grip strength 8# General Strength Comments: shoulder 3/5, biceps 4/5, triceps 4-/5 RLE Assessment RLE Assessment: Exceptions to [facility] Passive Range of Motion (PROM) Comments: limited ankle dorsiflexion ROM and gastroc/soleus muscle length impairments General Strength Comments: 4/5 to 4+/5 throughout LLE Assessment Passive Range of Motion (PROM) Comments: limited ankle dorsiflexion ROM and gastroc/soleus muscle  length impairments General Strength Comments: 4/5 to 4+/5 throughout  Skilled Therapeutic Intervention: Pt received in w/c, denies pain and agreeable to treatment. Assessed all mobility and balance outcome measures as above with S overall using RW. Requires ongoing cueing for safety with RW and reviewed recommendation for 24/7 S with patient. Pt verbalizes that he has a family friend with a RW; per CSW pt and wife had previously requested RW so it has been ordered. Assisted pt to use restroom with S. Remained in w/c at end of session, chair lap belt alarm intact and all needs  in reach. Pt with no further questions/concerns regarding d/c home at this time.    Benjiman Core Monnie Gudgel 11/09/2018, 11:25 AM

## 2018-11-09 NOTE — Progress Notes (Signed)
Occupational Therapy Discharge Summary  Patient Details  Name: RAJINDER MESICK MRN: [id number] Date of Birth: 09-14-1961  Today's Date: 11/09/2018 OT Individual Time: 8527-7824 OT Individual Time Calculation (min): 63 min    Patient has met 6 of 10 long term goals due to improved activity tolerance, improved balance, postural control, ability to compensate for deficits, improved awareness and improved coordination.  Patient to discharge at overall Supervision level.  Patient's care partner is independent to provide the necessary physical and cognitive assistance at discharge.  Recommend 24 hour supervision to ensure safety in home environment.  Reasons goals not met: Patient requires occ CG assistance in stance due to impaired balance  Recommendation:  Patient will benefit from ongoing skilled OT services in home health setting to continue to advance functional skills in the area of BADL, iADL and Reduce care partner burden.  Equipment:    No equipment provided, provided long handled bath sponge  Reasons for discharge: treatment goals met  Patient/family agrees with progress made and goals achieved: Yes  OT Discharge   Precautions/Restrictions  Precautions Precautions: Fall Restrictions Weight Bearing Restrictions: No   OT session to include assessment of functional self care, mobility, UE function, balance  and safety.  Patient is pleasant, cooperative and participatory UE coordination:  In addition to assessment below - box and blocks:  R = 26, L = 21 Reviewed: Safe technique for transfer, mobility, self care and IADL in home environment.  Patient with limited insight related to current level of function and what to expect at home stating "I won't have any trouble with that when I go home"  And "regular underwear will be easier to put on when I go home" although he will continue to benefit from pull-ups or briefs due to incontinence.  Reviewed progress toward goals and  recommendation of 24 hour supervision which he was able to teach back.   Patient remained in w/c at close of session to complete lunch with seat belt alarm set.  He states that he feels ready to go home tomorrow and has no further questions.   General   Vital Signs   Pain Pain Assessment Pain Scale: 0-10 Pain Score: 0-No pain ADL ADL Equipment Provided: Long-handled sponge Eating: Set up Where Assessed-Eating: Chair Grooming: Supervision/safety Where Assessed-Grooming: Sitting at sink Upper Body Bathing: Supervision/safety Where Assessed-Upper Body Bathing: Shower Lower Body Bathing: Contact guard Where Assessed-Lower Body Bathing: Shower Upper Body Dressing: Setup Where Assessed-Upper Body Dressing: Edge of bed Lower Body Dressing: Minimal assistance Where Assessed-Lower Body Dressing: Edge of bed Toileting: Supervision/safety Where Assessed-Toileting: Glass blower/designer: Close supervision Toilet Transfer Method: Counselling psychologist: Energy manager: Close supervision Social research officer, government Method: Heritage manager: Gaffer Baseline Vision/History: Wears glasses Wears Glasses: Reading only Patient Visual Report: No change from baseline Vision Assessment?: No apparent visual deficits Perception  Perception: Within Functional Limits Praxis Praxis: Intact Cognition Overall Cognitive Status: History of cognitive impairments - at baseline Arousal/Alertness: Awake/alert Orientation Level: Oriented X4 Attention: Sustained;Selective Sustained Attention: Appears intact Selective Attention: Appears intact Selective Attention Impairment: Verbal basic;Functional basic Memory: Impaired Memory Impairment: Decreased recall of new information Decreased Short Term Memory: Verbal basic;Functional basic Awareness: Impaired Awareness Impairment: Emergent impairment Problem Solving: Impaired Problem  Solving Impairment: Verbal basic Safety/Judgment: Impaired Sensation Sensation Light Touch: Appears Intact Coordination Fine Motor Movements are Fluid and Coordinated: No Heel Shin Test: WNL Motor    Mobility  Bed Mobility Bed Mobility: Supine to  Sit;Sit to Supine Rolling Right: Independent Rolling Left: Independent Supine to Sit: Supervision/Verbal cueing Sit to Supine: Supervision/Verbal cueing Transfers Sit to Stand: Supervision/Verbal cueing Stand to Sit: Supervision/Verbal cueing  Trunk/Postural Assessment  Cervical Assessment Cervical Assessment: Exceptions to WFL(forward head) Thoracic Assessment Thoracic Assessment: Within Functional Limits Lumbar Assessment Lumbar Assessment: Within Functional Limits Postural Control Postural Control: Deficits on evaluation Righting Reactions: absent ankle strategy, delayed stepping reactions  Balance Balance Balance Assessed: Yes Standardized Balance Assessment Standardized Balance Assessment: Functional Gait Assessment;Timed Up and Go Test Timed Up and Go Test TUG: Normal TUG Normal TUG (seconds): 18(18 sec no AD, 27 sec with RW) Static Sitting Balance Static Sitting - Balance Support: Feet supported Static Sitting - Level of Assistance: 6: Modified independent (Device/Increase time) Dynamic Sitting Balance Dynamic Sitting - Balance Support: Feet supported Dynamic Sitting - Level of Assistance: 6: Modified independent (Device/Increase time) Dynamic Sitting - Balance Activities: Reaching for objects Static Standing Balance Static Standing - Balance Support: During functional activity Static Standing - Level of Assistance: 5: Stand by assistance Dynamic Standing Balance Dynamic Standing - Balance Support: During functional activity Dynamic Standing - Level of Assistance: 5: Stand by assistance Functional Gait  Assessment Gait assessed : Yes(with RW) Gait Level Surface: Walks 20 ft in less than 7 sec but greater than 5.5  sec, uses assistive device, slower speed, mild gait deviations, or deviates 6-10 in outside of the 12 in walkway width. Change in Gait Speed: Able to change speed, demonstrates mild gait deviations, deviates 6-10 in outside of the 12 in walkway width, or no gait deviations, unable to achieve a major change in velocity, or uses a change in velocity, or uses an assistive device. Gait with Horizontal Head Turns: Performs head turns smoothly with slight change in gait velocity (eg, minor disruption to smooth gait path), deviates 6-10 in outside 12 in walkway width, or uses an assistive device. Gait with Vertical Head Turns: Performs task with moderate change in gait velocity, slows down, deviates 10-15 in outside 12 in walkway width but recovers, can continue to walk. Gait and Pivot Turn: Pivot turns safely in greater than 3 sec and stops with no loss of balance, or pivot turns safely within 3 sec and stops with mild imbalance, requires small steps to catch balance. Step Over Obstacle: Is able to step over one shoe box (4.5 in total height) but must slow down and adjust steps to clear box safely. May require verbal cueing. Gait with Narrow Base of Support: Is able to ambulate for 10 steps heel to toe with no staggering. Gait with Eyes Closed: Walks 20 ft, slow speed, abnormal gait pattern, evidence for imbalance, deviates 10-15 in outside 12 in walkway width. Requires more than 9 sec to ambulate 20 ft. Ambulating Backwards: Walks 20 ft, uses assistive device, slower speed, mild gait deviations, deviates 6-10 in outside 12 in walkway width. Steps: Alternating feet, must use rail. Total Score: 18 Extremity/Trunk Assessment RUE Assessment RUE Assessment: Exceptions to [facility] Active Range of Motion (AROM) Comments: opposition intact with effort General Strength Comments: shoulder 3/5, biceps 4/5, triceps 4-/5, wrist 4/5, grip (with Jamar) #3 15# LUE Assessment LUE Assessment: Exceptions to [facility] Active Range of  Motion (AROM) Comments: shoulder 0-125, digit IP flexion limited, opposition intact with effort, grip strength 8# General Strength Comments: shoulder 3/5, biceps 4/5, triceps 4-/5   Lilee Aldea A Raynell Scott 11/09/2018, 1:26 PM

## 2018-11-09 NOTE — Progress Notes (Signed)
[place] PHYSICAL MEDICINE & REHABILITATION PROGRESS NOTE   Subjective/Complaints: Patient seen sitting up in his chair eating breakfast.  He states he slept well overnight.  He is aware of discharge tomorrow.  ROS: Denies CP, SOB, N/V/D  Objective:   No results found. No results for input(s): WBC, HGB, HCT, PLT in the last 72 hours. No results for input(s): NA, K, CL, CO2, GLUCOSE, BUN, CREATININE, CALCIUM in the last 72 hours.  Intake/Output Summary (Last 24 hours) at 11/09/2018 0952 Last data filed at 11/09/2018 0842 Gross per 24 hour  Intake 780 ml  Output 2 ml  Net 778 ml     Physical Exam: Vital Signs Blood pressure 113/79, pulse 97, temperature 97.9 F (36.6 C), temperature source Oral, resp. rate 18, height 5\' 5"  (1.651 m), weight 92.3 kg, SpO2 92 %.   Constitutional: No distress . Vital signs reviewed. HENT: Normocephalic.  Atraumatic. Eyes: EOMI. No discharge. Cardiovascular: RRR.  No JVD. Respiratory: CTA bilaterally.  Normal effort. GI: BS +. Non-distended. Musc: No edema or tenderness in extremities. Neurologic: Alert and oriented. Dysarthria Motor: 5/5 grossly throughout Skin: Warm and dry.  Intact.  Assessment/Plan: 1. Functional deficits secondary to Myotonic dystrophy with falls which require 3+ hours per day of interdisciplinary therapy in a comprehensive inpatient rehab setting.  Physiatrist is providing close team supervision and 24 hour management of active medical problems listed below.  Physiatrist and rehab team continue to assess barriers to discharge/monitor patient progress toward functional and medical goals  Care Tool:  Bathing    Body parts bathed by patient: Right arm, Left arm, Chest, Abdomen, Front perineal area, Right upper leg, Left upper leg, Face   Body parts bathed by helper: Buttocks, Right lower leg, Left lower leg     Bathing assist Assist Level: Minimal Assistance - Patient > 75%     Upper Body  Dressing/Undressing Upper body dressing   What is the patient wearing?: Pull over shirt    Upper body assist Assist Level: Set up assist    Lower Body Dressing/Undressing Lower body dressing      What is the patient wearing?: Pants, Underwear/pull up     Lower body assist Assist for lower body dressing: Minimal Assistance - Patient > 75%     Toileting Toileting    Toileting assist Assist for toileting: Minimal Assistance - Patient > 75%     Transfers Chair/bed transfer  Transfers assist     Chair/bed transfer assist level: Supervision/Verbal cueing     Locomotion Ambulation   Ambulation assist      Assist level: Contact Guard/Touching assist Assistive device: Walker-rolling Max distance: 200   Walk 10 feet activity   Assist     Assist level: Supervision/Verbal cueing Assistive device: Walker-rolling   Walk 50 feet activity   Assist    Assist level: Supervision/Verbal cueing Assistive device: Walker-rolling    Walk 150 feet activity   Assist Walk 150 feet activity did not occur: Safety/medical concerns  Assist level: Contact Guard/Touching assist Assistive device: Walker-rolling    Walk 10 feet on uneven surface  activity   Assist Walk 10 feet on uneven surfaces activity did not occur: Safety/medical concerns         Wheelchair     Assist Will patient use wheelchair at discharge?: No Type of Wheelchair: Manual    Wheelchair assist level: Contact Guard/Touching assist Max wheelchair distance: 33'    Wheelchair 50 feet with 2 turns activity    Assist  Assist Level: Contact Guard/Touching assist   Wheelchair 150 feet activity     Assist Wheelchair 150 feet activity did not occur: Safety/medical concerns(fatigue)        Medical Problem List and Plan: 1.Debility and encephalopathysecondary to AKI/dehydration in a 57 yo male with myotonic dystrophy  Continue CIR  Plan for d/c tomorrow  Will see  patient for transitional care management in 1-2 weeks post-discharge 2. DVT Prophylaxis/Anticoagulation: Pharmaceutical:Lovenox 3. Pain Management:Tylenol as needed 4. Mood:LCSW to follow for evaluation and support 5. Neuropsych: This patientis ?not fullycapable of making decisions on hisown behalf. 6. Skin/Wound Care:Routine pressure relief measures 7. Fluids/Electrolytes/Nutrition:Monitor I's and O's.   BMET within normal limits on 11/9 8.Myotonic dystrophy: With reports of progression. Respiratory status stable 9.ADHD: Continue Adderall 3 times daily  Stable 10.Thrombocytopenia: Resolved  Platelets 163 on 11/9 11.?  Lung nodule  Repeat imaging ~01/2019  Chest x-ray reviewed 11/11, reviewed, right apex abnormality  Follow-up as outpatient 12.  Leukopenia  WBC 3.7 on 11/12  Will need outpatient follow-up  Cont to monitor  LOS:7 days A FACE TO FACE EVALUATION WAS PERFORMED   Lorie Phenix 11/09/2018, 9:52 AM

## 2018-11-09 NOTE — Progress Notes (Signed)
Speech Language Pathology Discharge Summary  Patient Details  Name: [name] MRN: [id number] Date of Birth: 02/20/1961  Today's Date: 11/09/2018 SLP Individual Time: 0905-1000 SLP Individual Time Calculation (min): 55 min   Skilled Therapeutic Interventions:  Pt was seen for skilled ST targeting communication goals.  Pt was able to return demonstration of both IMST and EMST devices for 25 repetitions each with a perceived effort level of 5/10.  Resistance was increased given that effort level as been at 5 or less for the last several day and pt appears to be tolerating exercise well.  Pt completed 5 repetitions each at increased resistance with evidence of and reports of good toleration.  Provided instruction on how to adjust device and encouraged pt to continue program at home.  SLP also facilitated the session with a verbal reasoning task targeting use of word finding and speech intelligibility strategies.  No evidence of word finding difficulties noted during structured or unstructured tasks.  Pt needed x1 supervision verbal cue to swallow saliva to correct wet vocal quality but was able to self correct afterwards with mod I.  Intermittent supervision cues were needed to increase vocal intensity to achieve intelligibility at the conversational level.  Pt was returned to room and left in wheelchair with seat belt alarm on and all needs within reach.  Continue per current plan of care.      Patient has met 2 of 2 long term goals.  Patient to discharge at overall Supervision level.  Reasons goals not met:     Clinical Impression/Discharge Summary:   Pt has made functional gains while inpatient and is discharging  having met 2 out of 2 long term goals.  Pt is currently supervision for tasks due to mild dysarthria and word finding deficits.  Pt education is complete at this time, no family has been present for education.  Pt has demonstrated improved word finding and use of compensatory  speech intelligibility strategies.  Pt is discharging home.  No further ST needs indicated as pt is at baseline for cognition, communication, and swallowing function.    Care Partner:  Caregiver Able to Provide Assistance: Yes     Recommendation:  None      Equipment: none recommended by SLP   Reasons for discharge: Discharged from hospital   Patient/Family Agrees with Progress Made and Goals Achieved: Yes    Yailine Ballard, Selinda Orion 11/09/2018, 10:14 AM

## 2018-11-10 NOTE — Progress Notes (Signed)
Patient and wife given discharge instructions and wife voiced understanding. Patient wheeled down via wheelchair by nurse tech with all personal belonging. Patient wife accompanied him.  Nicholes Rough, LPN

## 2018-11-13 ENCOUNTER — Telehealth: Payer: Self-pay

## 2018-11-13 NOTE — Telephone Encounter (Signed)
Attempted to contact patient to complete TCM and confirm hospital follow up. No answer.

## 2018-11-14 ENCOUNTER — Telehealth: Payer: Self-pay

## 2018-11-14 NOTE — Telephone Encounter (Signed)
Ashby Dawes, PT from [facility] called stating that the patient is refusing HHPT. Wife believes he is depressed and will be seeing his PCP this week for possible adjusting medication. Will call back if decide wants to start PT.

## 2018-11-14 NOTE — Telephone Encounter (Signed)
Attempted to contact pt to complete TCM and confirm hosp f/u. No answer.

## 2018-11-15 ENCOUNTER — Encounter: Payer: Self-pay | Admitting: *Deleted

## 2018-11-15 ENCOUNTER — Other Ambulatory Visit: Payer: Self-pay | Admitting: *Deleted

## 2018-11-15 NOTE — Telephone Encounter (Signed)
Spoke with patients wife, [name].   Transition Care Management Follow-up Telephone Call  Inpatient: 11/5-11/8 for Acute Encephalopathy Rehab: 11/8-11/16   How have you been since you were released from the hospital? "He's back to his baseline I think"   Do you understand why you were in the hospital? yes, "he had dehydration and acute kidney damage"   Do you understand the discharge instructions? yes   Where were you discharged to? Home. Lives with wife.    Items Reviewed:  Medications reviewed: yes  Allergies reviewed: yes  Dietary changes reviewed: yes  Referrals reviewed: yes   Functional Questionnaire:   Activities of Daily Living (ADLs):   He states they are independent in the following: ambulation, feeding, continence, grooming and toileting States they require assistance with the following: bathing and hygiene and dressing   Wife states patient is unaware of when he is not clean and often requires assistance with dressing/bathing. Used walker x 2 days after discharge. [name] is concerned pts depression is worse, plans to discuss with PCP at f/u on 11/16/18. Also requesting extended release Adderall if possible.    Any transportation issues/concerns?: no   Any patient concerns? yes, Wife states patient refused to have PT home assessment.    Confirmed importance and date/time of follow-up visits scheduled yes  Provider Appointment booked with PCP 11/16/18.   Confirmed with patient if condition begins to worsen call PCP or go to the ER.  Patient was given the office number and encouraged to call back with question or concerns.  : yes

## 2018-11-15 NOTE — Patient Outreach (Signed)
[facility]) Care Management  11/15/2018  [name] 01/03/1961 [id number]   Subjective: Telephone call to patient's home / mobile number, spoke with patient, and HIPAA verified.  Discussed [facility] Care Management UMR Transition of care follow up, patient voiced understanding, and is in agreement to follow up.    Patient states he is doing alright and would like RNCM to complete follow up call with wife.  Patient gave verbal authorization for [facility] to speak with wife [name] regarding his healthcare needs as needed.  Spoke with patient's wife, discussed [place] Management UMR Transition of care follow up, patient voiced understanding, and is in agreement to follow up on patient's behalf.   Wife states patient refused home health physical therapy services and outpatient services.   Wife states patient  has a follow up appointment with primary MD on 11/16/18 and rehab MD on 11/28/18.    States she is planning to ask primary MD to change Adderall dose to once a day sustained release versus three times a day, hoping this will help his motivation, and patient will agree to work with outpatient physical therapy.  Wife states patient needs assistance to manage self care and has assistance as needed with activities of daily living / home management.  Wife voices understanding of medical diagnosis and treatment plan. States she is accessing the following [facility] benefits on patient's behalf: outpatient pharmacy, hospital indemnity (not sure if chosen, verbally given contact number for [employer] [phone], will file claim if appropriate, verbally given contact number for [employer] [phone] to request itemized bill), Employee Assistance Counseling Program (verbally given contact number [phone], states she will follow up to obtain assistance as needed), and will call [employer] to start family medical leave act (FMLA) process (verbally given contact number for [employer]  [phone]).   Wife states patient does not have any education material, transition of care, care coordination, disease management, disease monitoring, transportation, community resource, or pharmacy needs at this time.  States she is very appreciative of the follow up and is in agreement to receive [place] Management information on patient's behalf.        Objective: Per KPN (Knowledge Performance Now, point of care tool) and chart review, patient hospitalized inpatient rehab 11/02/18 - 11/10/18 for Myotonic muscular dystrophy, Acute metabolic encephalopathy.  Patient inpatient acute hospitalized 10/30/18 - 11/12/18 for Myotonic muscular dystrophy, Acute metabolic encephalopathy.   Patient also has a history of Hypothyroidism, OSA (not on CPAP), Pulmonary nodule, AKI (acute kidney injury), Fall, and ADD (attention deficit disorder).       Assessment: Received UMR Transition of care referral on 11/07/18.    Transition of care follow up completed, no care management needs, and will proceed with case closure.        Plan: RNCM will send patient successful outreach letter, [facility] pamphlet, and magnet. RNCM will complete case closure due to follow up completed / no care management needs.        Phynix Horton H. [name], BSN, [place] Management [facility] Telephonic CM Phone: [phone] Fax: [phone]

## 2018-11-15 NOTE — Telephone Encounter (Signed)
Noted: nurse phone contact with patient for TCM. Signed:  Crissie Sickles, MD           11/15/2018

## 2018-11-16 ENCOUNTER — Ambulatory Visit: Payer: 59 | Admitting: Family Medicine

## 2018-11-16 ENCOUNTER — Encounter: Payer: Self-pay | Admitting: Family Medicine

## 2018-11-16 VITALS — BP 104/69 | HR 96 | Temp 97.4°F | Resp 16 | Ht 67.0 in | Wt 200.1 lb

## 2018-11-16 DIAGNOSIS — R5381 Other malaise: Secondary | ICD-10-CM | POA: Diagnosis not present

## 2018-11-16 DIAGNOSIS — F988 Other specified behavioral and emotional disorders with onset usually occurring in childhood and adolescence: Secondary | ICD-10-CM | POA: Diagnosis not present

## 2018-11-16 DIAGNOSIS — R4182 Altered mental status, unspecified: Secondary | ICD-10-CM

## 2018-11-16 DIAGNOSIS — G7111 Myotonic muscular dystrophy: Secondary | ICD-10-CM

## 2018-11-16 DIAGNOSIS — D72819 Decreased white blood cell count, unspecified: Secondary | ICD-10-CM | POA: Diagnosis not present

## 2018-11-16 DIAGNOSIS — N179 Acute kidney failure, unspecified: Secondary | ICD-10-CM

## 2018-11-16 LAB — BASIC METABOLIC PANEL
BUN: 13 mg/dL (ref 6–23)
CALCIUM: 10.1 mg/dL (ref 8.4–10.5)
CO2: 33 meq/L — AB (ref 19–32)
CREATININE: 0.91 mg/dL (ref 0.40–1.50)
Chloride: 101 mEq/L (ref 96–112)
GFR: 90.93 mL/min (ref >60.00)
Glucose, Bld: 112 mg/dL — ABNORMAL HIGH (ref 70–99)
Potassium: 5.3 mEq/L — ABNORMAL HIGH (ref 3.5–5.1)
SODIUM: 140 meq/L (ref 135–145)

## 2018-11-16 LAB — CBC WITH DIFFERENTIAL/PLATELET
BASOS ABS: 0 10*3/uL (ref 0.0–0.1)
Basophils Relative: 0.7 % (ref 0.0–3.0)
Eosinophils Absolute: 0.1 10*3/uL (ref 0.0–0.7)
Eosinophils Relative: 2 % (ref 0.0–5.0)
HCT: 46.1 % (ref 39.0–52.0)
Hemoglobin: 15.3 g/dL (ref 13.0–17.0)
LYMPHS ABS: 1.1 10*3/uL (ref 0.7–4.0)
Lymphocytes Relative: 24.5 % (ref 12.0–46.0)
MCHC: 33.1 g/dL (ref 30.0–36.0)
MCV: 93.1 fl (ref 78.0–100.0)
MONO ABS: 0.4 10*3/uL (ref 0.1–1.0)
Monocytes Relative: 9 % (ref 3.0–12.0)
NEUTROS ABS: 2.8 10*3/uL (ref 1.4–7.7)
NEUTROS PCT: 63.8 % (ref 43.0–77.0)
PLATELETS: 192 10*3/uL (ref 150.0–400.0)
RBC: 4.95 Mil/uL (ref 4.22–5.81)
RDW: 16.1 % — ABNORMAL HIGH (ref 11.5–15.5)
WBC: 4.4 10*3/uL (ref 4.0–10.5)

## 2018-11-16 MED ORDER — FLUOXETINE HCL 40 MG PO CAPS: 40.0000 mg | ORAL_CAPSULE | Freq: Every day | ORAL | 3 refills | Status: DC

## 2018-11-16 MED ORDER — AMPHETAMINE-DEXTROAMPHET ER 30 MG PO CP24: 30.0000 mg | ORAL_CAPSULE | ORAL | 0 refills | Status: DC

## 2018-11-16 NOTE — Progress Notes (Signed)
11/16/2018  CC:  Chief Complaint  Patient presents with  . Hospitalization Follow-up    TCM    Patient is a 57 y.o. Caucasian male who presents for  hospital follow up, specifically Transitional Care Services face-to-face visit. Dates hospitalized: 11/6-11/8, 2019 for inpatient staty.  11/8-11/16, 2019 for rehab stay. Days since d/c from hospital: 6 days. Patient was discharged from hospital to home. Reason for admission to hospital: confusion, weakness, FTT.  Was dehydrated, mentation improved with rehydration. W/u neg for infection.  MS changes felt to be due to AKI due to dehydration.  Got rehab x 8d after inpt stay. Pt was not agreeable to any in-home PT/OT Date of interactive (phone) contact with patient and/or caregiver: 11/13/18.  I have reviewed patient's discharge summary plus pertinent specific notes, labs, and imaging from the hospitalization.   Of note, a cXR 11/05/18 showed linear scarring and/or atelectasis in both lower lungs.  Also, in R lateral apex, there is an indistinct density corresponding to CT findings 08/2017.  Needs f/u CT chest 01/2019.  Doing better: walks short distances w/out walker.  Eating is fine, intake of water could be better. Wife says he mostly wants to lay on the couch and sleep.  Voiding and stooling fine. Says mood is "ok", but wife says he is definitely acting more depressed.  Denies SI or HI. +Anhedonia, +isolating behavior, + poor concentration.  Medication reconciliation was done today and patient is taking meds as recommended by discharging hospitalist/specialist.   Current adderall dosing: 20 mg qd- bid.  He can reliable take meds in the morning.  ROS: no CP, no SOB, no wheezing, no cough, no dizziness, no HAs, no rashes, no melena/hematochezia.  No polyuria or polydipsia.  No myalgias or arthralgias.   PMH:  Past Medical History:  Diagnosis Date  . Adult ADHD   . Aspiration pneumonitis ([place]) 04/2017, 08/2017   Recurrent  . Chronic  respiratory failure ([place])    As of 04/2018, Dr. Chase Caller suspects hypercapnic RF--but [name] normal.  May eventually need nocturnal noninvasive ventilation.  Ultimately, if progresses enough pt will need trach.  . Constipation, chronic    Has caused irritative urinary/bladder symptoms in the past.  . Depression   . Dizziness 06/2011+   Noncontrast CT and noncontrast MRI both negative 06/2011 at [facility] ED visit.  . Elevated transaminase level    Liver biopsy revealed fatty liver  . Erectile dysfunction   . Facial fracture ([place]) 06/2011   Nondisplaced bilateral LeFort I fracture: no surgery required. (ENT, Dr. Wilburn Cornelia)   . Fatty liver disease, nonalcoholic   . GERD (gastroesophageal reflux disease)   . History of thyroid cancer 2010   Medullary carcinoma of thyroid. Thyroidectomy 08/2009 (Dr. Aida Puffer at [facility]).  Needs periodic serum calcitonin monitoring.  Marland Kitchen Hyperlipidemia   . Hypothyroidism   . LVH (left ventricular hypertrophy)    by EKG criteria 06/2011.  Hx of heart murmur--Followed by cardiology at [facility]: EF's have been "stable" per cardio office note 12/29/10.  His echo 12/2012 showed normal LV size and wall thickness but EF 45-50% and global LV hypokinesis was found.  . Myotonic dystrophy ([place]) Dx'd 1996   Progressive muscle weakness and swallowing dysfunction.  Initial dx by Dr. Jannifer Franklin, neurologist, but subsequent annual specialist f/u has been with Dr. Ladon Applebaum [facility].  Progressive debilitation--PT referral 04/2017, pt quit PT 05/2017.  . Nephrolithiasis   . Obesity, Class I, BMI 30-34.9   . OSA (obstructive sleep apnea)  does not wear CPAP  . Pulmonary nodule, right 05/2017   RUL, 7 mm sub solid nodule.  Resolution noted on f/u CT 08/2017.    PSH:  Past Surgical History:  Procedure Laterality Date  . COLONOSCOPY  09/13/2007   normal (Dr. Deatra Ina).  . EXTRACORPOREAL SHOCK WAVE LITHOTRIPSY    . THYROIDECTOMY  08/2009   for medullary thyroid cancer (Dr. Conley Canal at [facility] follows  him for this).  . TRANSTHORACIC ECHOCARDIOGRAM  12/2012; 09/2017   2014-Normal LV size and wall thickness, LV EF 45-50%, with global hypokinesis of LV.  2018- EF 50-55%, normal wall motion, grd I DD, valves normal.    MEDS:  Outpatient Medications Prior to Visit  Medication Sig Dispense Refill  . acetaminophen (TYLENOL) 325 MG tablet Take 1-2 tablets (325-650 mg total) by mouth every 4 (four) hours as needed for mild pain.    Marland Kitchen amphetamine-dextroamphetamine (ADDERALL) 20 MG tablet 1 tab po tid (Patient taking differently: Take 20 mg by mouth 2 (two) times daily. ) 90 tablet 0  . FLUoxetine (PROZAC) 20 MG tablet TAKE 1 TABLET BY MOUTH ONCE DAILY (Patient taking differently: Take 20 mg by mouth daily. ) 30 tablet 3  . levothyroxine (SYNTHROID, LEVOTHROID) 150 MCG tablet TAKE 1 TABLET BY MOUTH ONCE DAILY EXCEPT FOR WEDNESDAY  SKIP DOSE (Patient taking differently: Take 150 mcg by mouth See admin instructions. Take 1 tablet every day except on Sunday) 30 tablet 1  . omeprazole (PRILOSEC) 40 MG capsule Take 1 capsule (40 mg total) by mouth daily. 90 capsule 3  . traZODone (DESYREL) 100 MG tablet TAKE 1 TABLET BY MOUTH AT BEDTIME AS NEEDED FOR SLEEP. (Patient taking differently: Take 100 mg by mouth at bedtime as needed for sleep. ) 30 tablet 11   No facility-administered medications prior to visit.    EXAM: BP 104/69 (BP Location: Left Arm, Patient Position: Sitting, Cuff Size: Normal)   Pulse 96   Temp (!) 97.4 F (36.3 C) (Temporal)   Resp 16   Ht 5\' 7"  (1.702 m)   Wt 200 lb 2 oz (90.8 kg)   SpO2 96%   BMI 31.34 kg/m   Gen; alert, mask -like facies per his usual, oriented x 4. AYT:KZSW: no injection, icteris, swelling, or exudate.  EOMI, PERRLA. Mouth: lips without lesion/swelling.  Oral mucosa pink and moist. Oropharynx without erythema, exudate, or swelling.  CV: RRR, no m/r/g.   LUNGS: CTA bilat, nonlabored resps, good aeration in all lung fields. ABD: soft, rotund, non distended and  non tender. EXT: no clubbing or cyanosis.  no edema.     Pertinent labs/imaging Lab Results  Component Value Date   TSH 2.335 10/31/2018   Lab Results  Component Value Date   WBC 3.7 (L) 11/06/2018   HGB 13.4 11/06/2018   HCT 42.6 11/06/2018   MCV 93.4 11/06/2018   PLT 166 11/06/2018   Lab Results  Component Value Date   CREATININE 1.02 11/03/2018   BUN 8 11/03/2018   NA 138 11/03/2018   K 4.1 11/03/2018   CL 101 11/03/2018   CO2 27 11/03/2018   Lab Results  Component Value Date   ALT 20 11/03/2018   AST 29 11/03/2018   ALKPHOS 78 11/03/2018   BILITOT 0.7 11/03/2018   Lab Results  Component Value Date   CHOL 233 (H) 07/17/2018   Lab Results  Component Value Date   HDL 72.00 07/17/2018   Lab Results  Component Value Date   LDLCALC 138 (H)  07/17/2018   Lab Results  Component Value Date   TRIG 117.0 07/17/2018   Lab Results  Component Value Date   CHOLHDL 3 07/17/2018   Lab Results  Component Value Date   PSA 0.38 07/17/2018   PSA 0.57 08/15/2012    ASSESSMENT/PLAN:  1) Generalized weakness, FTT: largely due to gradual progression of his myotonic dystrophy. He is pretty hard headed and declines PT/OT.  Will need to keep recommending this and maybe he will agree at some point.  2) Adult ADD/chronic fatigue/somnolence: adderall tid dosing has been marginally helpful, partially b/c he only remembers to take his morning dose with any consistency. Will try a change to adderall xr 30mg  qAM and see how he does.   If not lasting long enough, may need to try switch to vyvanse.  3) MDD, not ideally controlled: increase fluoxetine to 40mg  qd.  4) R lung apex with indistinct density that corresponds with CT chest findings 08/2017. Needs repeat CT chest 01/2019.  5) Leukopenia: on labs in hosp-->repeat cBC w/diff today.  6) AKI: dehydrated-->improved in hosp with IVF. Pt trying to hydrate better now. BMET today.  An After Visit Summary was printed and  given to the patient.  FOLLOW UP:   2 wks, f/u aDD/adderall xr  Signed:  Crissie Sickles, MD           11/16/2018

## 2018-11-19 ENCOUNTER — Encounter: Payer: Self-pay | Admitting: *Deleted

## 2018-11-28 ENCOUNTER — Encounter: Payer: 59 | Admitting: Physical Medicine & Rehabilitation

## 2018-11-28 ENCOUNTER — Other Ambulatory Visit: Payer: Self-pay | Admitting: Family Medicine

## 2018-11-30 ENCOUNTER — Encounter: Payer: Self-pay | Admitting: *Deleted

## 2018-11-30 ENCOUNTER — Encounter: Payer: Self-pay | Admitting: Family Medicine

## 2018-11-30 ENCOUNTER — Ambulatory Visit: Payer: 59 | Admitting: Family Medicine

## 2018-11-30 VITALS — BP 123/85 | HR 65 | Temp 97.5°F | Resp 16 | Ht 67.0 in | Wt 197.4 lb

## 2018-11-30 DIAGNOSIS — G7111 Myotonic muscular dystrophy: Secondary | ICD-10-CM

## 2018-11-30 DIAGNOSIS — R350 Frequency of micturition: Secondary | ICD-10-CM | POA: Diagnosis not present

## 2018-11-30 DIAGNOSIS — N3941 Urge incontinence: Secondary | ICD-10-CM | POA: Diagnosis not present

## 2018-11-30 DIAGNOSIS — F331 Major depressive disorder, recurrent, moderate: Secondary | ICD-10-CM

## 2018-11-30 DIAGNOSIS — F988 Other specified behavioral and emotional disorders with onset usually occurring in childhood and adolescence: Secondary | ICD-10-CM

## 2018-11-30 DIAGNOSIS — R627 Adult failure to thrive: Secondary | ICD-10-CM

## 2018-11-30 MED ORDER — AMPHETAMINE-DEXTROAMPHET ER 30 MG PO CP24: 30.0000 mg | ORAL_CAPSULE | ORAL | 0 refills | Status: DC

## 2018-11-30 NOTE — Progress Notes (Signed)
OFFICE VISIT  11/30/2018   CC:  Chief Complaint  Patient presents with  . Follow-up    ADD  . Urinary Incontinence  . Urinary Retention  . Urinary Frequency    HPI:    Patient is a 57 y.o. Caucasian male who presents for 2 week f/u ADD and depression. Last visit I increased his fluoxetine to 40mg  qd and changed him from short acting adderall bid to adderall xr 30 mg qd.  Interim hx: Mood not changed much, as expected.  No side effects from this med.  No SI or HI. He says there has been no signif change in focus/concentration, energy, or engagement in activities.  His wife says he is improved regarding these sx's, though.  He has tried vyvanse already in the past and did not respond to it.  He is still not amenable to getting PT or OT.  Feels like it won't really help, plus is apprehensive about cost and also doesn't want to inconvenience his wife/friends to get a ride to rehab AND he is not comfortable with anyone coming into his home and doing therapy.  He also has urinary complaints today: last night had some increased urinary frequency, incontinence x 2 last night. No pain with urination.  No foul odor of urine.  Urine is normal light yellow color. Has some trouble with making himself urinate today, but this is not a chronic problem. Water intake lately has been better, not much caffeine.   Past Medical History:  Diagnosis Date  . Adult ADHD   . Aspiration pneumonitis ([place]) 04/2017, 08/2017   Recurrent  . Chronic respiratory failure ([place])    As of 04/2018, Dr. Chase Caller suspects hypercapnic RF--but [name] normal.  May eventually need nocturnal noninvasive ventilation.  Ultimately, if progresses enough pt will need trach.  . Constipation, chronic    Has caused irritative urinary/bladder symptoms in the past.  . Depression   . Dizziness 06/2011+   Noncontrast CT and noncontrast MRI both negative 06/2011 at [facility] ED visit.  . Elevated transaminase level    Liver biopsy revealed  fatty liver  . Erectile dysfunction   . Facial fracture ([place]) 06/2011   Nondisplaced bilateral LeFort I fracture: no surgery required. (ENT, Dr. Wilburn Cornelia)   . Fatty liver disease, nonalcoholic   . GERD (gastroesophageal reflux disease)   . History of thyroid cancer 2010   Medullary carcinoma of thyroid. Thyroidectomy 08/2009 (Dr. Aida Puffer at [facility]).  Needs periodic serum calcitonin monitoring.  Marland Kitchen Hyperlipidemia   . Hypothyroidism   . LVH (left ventricular hypertrophy)    by EKG criteria 06/2011.  Hx of heart murmur--Followed by cardiology at [facility]: EF's have been "stable" per cardio office note 12/29/10.  His echo 12/2012 showed normal LV size and wall thickness but EF 45-50% and global LV hypokinesis was found.  . Myotonic dystrophy ([place]) Dx'd 1996   Progressive muscle weakness and swallowing dysfunction.  Initial dx by Dr. Jannifer Franklin, neurologist, but subsequent annual specialist f/u has been with Dr. Ladon Applebaum [facility].  Progressive debilitation--PT referral 04/2017, pt quit PT 05/2017.  . Nephrolithiasis   . Obesity, Class I, BMI 30-34.9   . OSA (obstructive sleep apnea)    does not wear CPAP  . Pulmonary nodule, right 05/2017   RUL, 7 mm sub solid nodule.  Resolution noted on f/u CT 08/2017.    Past Surgical History:  Procedure Laterality Date  . COLONOSCOPY  09/13/2007   normal (Dr. Deatra Ina).  . EXTRACORPOREAL SHOCK WAVE LITHOTRIPSY    .  THYROIDECTOMY  08/2009   for medullary thyroid cancer (Dr. Conley Canal at [facility] follows him for this).  . TRANSTHORACIC ECHOCARDIOGRAM  12/2012; 09/2017   2014-Normal LV size and wall thickness, LV EF 45-50%, with global hypokinesis of LV.  2018- EF 50-55%, normal wall motion, grd I DD, valves normal.    Outpatient Medications Prior to Visit  Medication Sig Dispense Refill  . acetaminophen (TYLENOL) 325 MG tablet Take 1-2 tablets (325-650 mg total) by mouth every 4 (four) hours as needed for mild pain.    Marland Kitchen FLUoxetine (PROZAC) 40 MG capsule Take 1 capsule  (40 mg total) by mouth daily. 90 capsule 3  . levothyroxine (SYNTHROID, LEVOTHROID) 150 MCG tablet TAKE 1 TABLET BY MOUTH ONCE DAILY EXCEPT FOR WEDNESDAY  SKIP DOSE (Patient taking differently: Take 150 mcg by mouth See admin instructions. Take 1 tablet every day except on Sunday) 30 tablet 1  . omeprazole (PRILOSEC) 40 MG capsule TAKE 1 CAPSULE BY MOUTH ONCE DAILY 90 capsule 2  . traZODone (DESYREL) 100 MG tablet TAKE 1 TABLET BY MOUTH AT BEDTIME AS NEEDED FOR SLEEP. (Patient taking differently: Take 100 mg by mouth at bedtime as needed for sleep. ) 30 tablet 11  . amphetamine-dextroamphetamine (ADDERALL XR) 30 MG 24 hr capsule Take 1 capsule (30 mg total) by mouth every morning. 30 capsule 0   No facility-administered medications prior to visit.     No Known Allergies  ROS As per HPI  PE: Blood pressure 123/85, pulse 65, temperature (!) 97.5 F (36.4 C), temperature source Oral, resp. rate 16, height 5\' 7" (1.702 m), weight 197 lb 6 oz (89.5 kg), SpO2 100 %. Gen: Alert, well appearing.  Patient is oriented to person, place, time, and situation. AFFECT: pleasant, lucid thought and speech. No further exam today.  LABS:  none  IMPRESSION AND PLAN:  1) MDD, recurrent, mild/mod episode.  Increased fluoxetine to 40mg 2 weeks ago.  No response yet. No change today.  2) Adult ADD with chronic fatigue and chronic excessive daytime somnolence.  This is all due to his progressing myotonic dystrophy. After discussion today they want to just continue with adderall xr 30mg qd. I did electronic rx\'s for adderall xr 30mg, 1 qd today for Dec 2019 and Jan 2020.  Appropriate fill on/after date was noted on each rx.  3) FTT: he\'s doing better with intake of food and clear liquids, decreasing intake of colas/tea/coffee.  4) Urinary incontinence last night x 2.  Suspect this is secondary to increase in fluid intake + excessive sedation/deep sleep from taking 100 mg trazodone.  He was unable to  urinate in office so a UA could not be done. Low suspicion of UTI, but if sx\'s persist over the weekend despite decreasing fluid intake in the evenings and taking only 1/2 of trazodone tab, then he\'ll call and we\'ll treat empirically for UTI at that time. He likely has some baseline OAB and/or symptomatic bph but at this time I think the potential adverse side effects of meds used to treat these conditions outweigh the potential therapeutic benefit.  Spent 25 min with pt today, with >50% of this time spent in counseling and care coordination regarding the above problems.  An After Visit Summary was printed and given to the patient.  FOLLOW UP: Return in about 6 weeks (around 01/11/2019) for routine chronic illness f/u.  Signed:  Phil , MD           12 /05/2018

## 2018-12-06 ENCOUNTER — Other Ambulatory Visit: Payer: Self-pay | Admitting: Family Medicine

## 2018-12-07 ENCOUNTER — Encounter: Payer: 59 | Admitting: Physical Medicine & Rehabilitation

## 2018-12-12 ENCOUNTER — Telehealth: Payer: Self-pay | Admitting: *Deleted

## 2018-12-12 ENCOUNTER — Emergency Department (INDEPENDENT_AMBULATORY_CARE_PROVIDER_SITE_OTHER)
Admission: EM | Admit: 2018-12-12 | Discharge: 2018-12-12 | Disposition: A | Discharge status: Home / Self Care | Payer: 59 | Source: Home / Self Care | Attending: Family Medicine | Admitting: Family Medicine

## 2018-12-12 ENCOUNTER — Other Ambulatory Visit: Payer: Self-pay

## 2018-12-12 ENCOUNTER — Emergency Department (INDEPENDENT_AMBULATORY_CARE_PROVIDER_SITE_OTHER): Payer: 59

## 2018-12-12 ENCOUNTER — Encounter: Payer: Self-pay | Admitting: *Deleted

## 2018-12-12 ENCOUNTER — Ambulatory Visit: Payer: Self-pay

## 2018-12-12 DIAGNOSIS — R6889 Other general symptoms and signs: Secondary | ICD-10-CM

## 2018-12-12 DIAGNOSIS — J069 Acute upper respiratory infection, unspecified: Secondary | ICD-10-CM

## 2018-12-12 DIAGNOSIS — R05 Cough: Secondary | ICD-10-CM | POA: Diagnosis not present

## 2018-12-12 DIAGNOSIS — R0989 Other specified symptoms and signs involving the circulatory and respiratory systems: Secondary | ICD-10-CM

## 2018-12-12 LAB — POCT INFLUENZA A/B
Influenza A, POC: NEGATIVE
Influenza B, POC: NEGATIVE

## 2018-12-12 MED ORDER — IPRATROPIUM-ALBUTEROL 0.5-2.5 (3) MG/3ML IN SOLN
3.0000 mL | Freq: Once | RESPIRATORY_TRACT | Status: AC
  Administered 2018-12-12: 3 mL via RESPIRATORY_TRACT

## 2018-12-12 NOTE — Telephone Encounter (Signed)
Agreed.  He needs to be seen ASAP--urgent care appropriate.

## 2018-12-12 NOTE — ED Triage Notes (Signed)
Pt c/o cough, congestion and chills x last night. No OTC meds. Hx of pneumonia and SARDS.

## 2018-12-12 NOTE — Discharge Instructions (Signed)
°  Please follow up with your family doctor by early next week if not improving, sooner if worsening.  Call 911 or go to the hospital if worsening this evening.

## 2018-12-12 NOTE — Telephone Encounter (Signed)
Incoming call from Patient wife who states her husband Mytonioc Dystrophy.  Wife states Husband has a wet cough, usually dry.  Has a history of pneumonia x2.   Denies nausea and vomiting. Denies SOB. Sat 88 to 100.  Did not eat much last night.  Warm to touch .  Temp was 98.5 shivering Last hospitalizatiion was May 2018 .  Wife was calling for an appointment. PCP is not in office today.  Wife states that she will Patient to Urgent care [place].                         Reason for Disposition . [1] Lung disease (cystic fibrosis, pt. on oxygen) AND [2] related symptom  Answer Assessment - Initial Assessment Questions 1. DISEASE: "What disease does your child have?"     Muscular /dystrophy 2. SYMPTOMS: "What are the current symptoms?" "What's the worst symptom?"    Patient's wife    States the he has a wet cough 3. CHANGE: "What's changed from the usual pattern?"     Usual has a dry cough,  Temp 98.5,  o2 sat88 to 100 usually 96-98, did not eat much last night 4. CAUSE: "What do you think is causing the change?"     *No Answer* 5. RECURRENT SYMPTOM: "Has your child had *No Answer* before?" If so, ask: "When was the last time?" "What happened that time?"     Hospitalized May 2018 6. MEDS: "Is your child taking any prescription medicines for this?"     *No Answer* 7. HOSPITAL ADMISSIONS: "When was the last time your child was admitted to the hospital for complications of this disease?" "How long was he in the hospital?" (This information helps determine the seriousness of the child's condition).      *No Answer* 8. SPECIALIST: "Which doctor takes care of this disease?"     *No Answer* 9. CHILD'S APPEARANCE: "How sick is your child acting?" " What is he doing right now?" If asleep, ask: "How was he acting before he went to sleep?"    Has not eaten or drank anything yet.  Protocols used: CHRONIC OR COMPLEX DISEASES-P-AH

## 2018-12-12 NOTE — Telephone Encounter (Signed)
Attempted to call pt to f/u with him after his visit earlier today. No answer and VM is full.

## 2018-12-12 NOTE — ED Provider Notes (Signed)
[name]    CSN: [id number] Arrival date & time: 12/12/18  5035     History   Chief Complaint Chief Complaint  Patient presents with  . Cough    HPI [name] is a 57 y.o. male.   HPI [name] is a 57 y.o. male presenting to UC with hx of myotonic dystrophy, SARDS and pneumonia c/o cough, congestion and chills since last night. Pt was admitted to the hospital last month and had been doing well.  His providers have encouraged him to be evaluated for any new coughs.  Denies recorded fever. Mild SOB. Denies n/v/d.    Past Medical History:  Diagnosis Date  . Adult ADHD   . Aspiration pneumonitis ([place]) 04/2017, 08/2017   Recurrent  . Chronic respiratory failure ([place])    As of 04/2018, Dr. Chase Caller suspects hypercapnic RF--but [name] normal.  May eventually need nocturnal noninvasive ventilation.  Ultimately, if progresses enough pt will need trach.  . Constipation, chronic    Has caused irritative urinary/bladder symptoms in the past.  . Depression   . Dizziness 06/2011+   Noncontrast CT and noncontrast MRI both negative 06/2011 at [facility] ED visit.  . Elevated transaminase level    Liver biopsy revealed fatty liver  . Erectile dysfunction   . Facial fracture ([place]) 06/2011   Nondisplaced bilateral LeFort I fracture: no surgery required. (ENT, Dr. Wilburn Cornelia)   . Fatty liver disease, nonalcoholic   . GERD (gastroesophageal reflux disease)   . History of thyroid cancer 2010   Medullary carcinoma of thyroid. Thyroidectomy 08/2009 (Dr. Aida Puffer at [facility]).  Needs periodic serum calcitonin monitoring.  Marland Kitchen Hyperlipidemia   . Hypothyroidism   . LVH (left ventricular hypertrophy)    by EKG criteria 06/2011.  Hx of heart murmur--Followed by cardiology at [facility]: EF's have been "stable" per cardio office note 12/29/10.  His echo 12/2012 showed normal LV size and wall thickness but EF 45-50% and global LV hypokinesis was found.  . Myotonic dystrophy ([place]) Dx'd 1996   Progressive muscle weakness and swallowing dysfunction.  Initial dx by Dr. Jannifer Franklin, neurologist, but subsequent annual specialist f/u has been with Dr. Ladon Applebaum [facility].  Progressive debilitation--PT referral 04/2017, pt quit PT 05/2017.  . Nephrolithiasis   . Obesity, Class I, BMI 30-34.9   . OSA (obstructive sleep apnea)    does not wear CPAP  . Pulmonary nodule, right 05/2017   RUL, 7 mm sub solid nodule.  Resolution noted on f/u CT 08/2017.    Patient Active Problem List   Diagnosis Date Noted  . Myotonic dystrophy ([place])   . Abnormal chest x-ray   . Abnormal CT scan of lung   . Debility   . Leukopenia   . Encephalopathy 11/02/2018  . Acute metabolic encephalopathy 46/56/8127  . Pulmonary nodule 10/31/2018  . AKI (acute kidney injury) ([place]) 10/31/2018  . Generalized weakness 10/31/2018  . Fall 10/31/2018  . Weakness   . Aspiration pneumonia ([place]) 05/09/2017  . History of fall 05/09/2017  . Viral gastroenteritis 03/13/2014  . Autonomic dysfunction 09/05/2013  . Tachycardia 02/20/2013  . ADD (attention deficit disorder) 08/15/2012  . Depression 08/15/2012  . History of thyroid cancer   . Dizziness, nonspecific 12/07/2011  . Hypothyroidism 12/07/2011  . DEPRESSION 03/20/2008  . Myotonic muscular dystrophy ([place]) 03/20/2008  . GERD 03/20/2008  . SLEEP APNEA 03/20/2008    Past Surgical History:  Procedure Laterality Date  . COLONOSCOPY  09/13/2007   normal (Dr. Deatra Ina).  Marland Kitchen  EXTRACORPOREAL SHOCK WAVE LITHOTRIPSY    . THYROIDECTOMY  08/2009   for medullary thyroid cancer ([facility]. Conley Canal at [facility] follows him for this).  . TRANSTHORACIC ECHOCARDIOGRAM  12/2012; 09/2017   2014-Normal LV size and wall thickness, LV EF 45-50%, with global hypokinesis of LV.  2018- EF 50-55%, normal wall motion, grd I DD, valves normal.       Home Medications    Prior to Admission medications   Medication Sig Start Date End Date Taking? Authorizing Provider  acetaminophen (TYLENOL) 325 MG  tablet Take 1-2 tablets (325-650 mg total) by mouth every 4 (four) hours as needed for mild pain. 11/06/18   Love, Ivan Anchors, PA-C  amphetamine-dextroamphetamine (ADDERALL XR) 30 MG 24 hr capsule Take 1 capsule (30 mg total) by mouth every morning. 11/30/18   [name], [name], MD  FLUoxetine (PROZAC) 40 MG capsule Take 1 capsule (40 mg total) by mouth daily. 11/16/18   [name], [name], MD  levothyroxine (SYNTHROID, LEVOTHROID) 150 MCG tablet TAKE 1 TABLET BY MOUTH ONCE DAILY EXCEPT FOR Sunday  SKIP DOSE 12/06/18   [name], [name], MD  omeprazole (PRILOSEC) 40 MG capsule TAKE 1 CAPSULE BY MOUTH ONCE DAILY 11/28/18   [name], [name], MD  traZODone (DESYREL) 100 MG tablet TAKE 1 TABLET BY MOUTH AT BEDTIME AS NEEDED FOR SLEEP. Patient taking differently: Take 100 mg by mouth at bedtime as needed for sleep.  07/04/18   [name], [name], MD    Family History Family History  Problem Relation Age of Onset  . Arthritis Mother   . Hypertension Mother   . Alcohol abuse Father   . Arthritis Father   . Hypertension Father   . Colon cancer Father   . Prostate cancer Father     Social History Social History   Tobacco Use  . Smoking status: Never Smoker  . Smokeless tobacco: Never Used  Substance Use Topics  . Alcohol use: Yes    Comment: rare beer  . Drug use: No     Allergies   Patient has no known allergies.   Review of Systems Review of Systems  Constitutional: Positive for chills and fever (subjective).  HENT: Positive for congestion. Negative for ear pain and sore throat.   Respiratory: Positive for cough, chest tightness and shortness of breath.   Gastrointestinal: Negative for diarrhea, nausea and vomiting.     Physical Exam Triage Vital Signs ED Triage Vitals  Enc Vitals Group     BP 12/12/18 0950 107/72     Pulse Rate 12/12/18 0950 (!) 106     Resp 12/12/18 0950 18     Temp 12/12/18 0950 98.6 F (37 C)     Temp Source 12/12/18 0950 Oral     SpO2 12/12/18 0950 94  %     Weight 12/12/18 0951 197 lb (89.4 kg)     Height 12/12/18 0951 5\' 6"  (1.676 m)     Head Circumference --      Peak Flow --      Pain Score 12/12/18 0951 0     Pain Loc --      Pain Edu? --      Excl. in [place]? --    No data found.  Updated Vital Signs BP 107/72 (BP Location: Right Arm)   Pulse (!) 106   Temp 98.6 F (37 C) (Oral)   Resp 18   Ht 5\' 6"  (1.676 m)   Wt 197 lb (89.4 kg)   SpO2 94%  BMI 31.80 kg/m   Visual Acuity Right Eye Distance:   Left Eye Distance:   Bilateral Distance:    Right Eye Near:   Left Eye Near:    Bilateral Near:     Physical Exam Vitals signs and nursing note reviewed.  Constitutional:      Appearance: Normal appearance. He is well-developed.  HENT:     Head: Normocephalic and atraumatic.     Right Ear: Tympanic membrane normal.     Left Ear: Tympanic membrane normal.     Nose: Nose normal.     Mouth/Throat:     Lips: Pink.     Mouth: Mucous membranes are dry.     Pharynx: Oropharynx is clear.  Eyes:     Extraocular Movements: Extraocular movements intact.  Neck:     Musculoskeletal: Normal range of motion and neck supple.  Cardiovascular:     Rate and Rhythm: Normal rate and regular rhythm.     Comments: Mild tachycardia in triage, regular rate and rhythm on exam Pulmonary:     Effort: Pulmonary effort is normal. No respiratory distress.     Breath sounds: No stridor. Examination of the right-upper field reveals decreased breath sounds. Examination of the right-middle field reveals decreased breath sounds. Examination of the right-lower field reveals decreased breath sounds. Decreased breath sounds and wheezing ( faint, diffuse) present.  Musculoskeletal: Normal range of motion.  Skin:    General: Skin is warm and dry.  Neurological:     Mental Status: He is alert and oriented to person, place, and time.  Psychiatric:        Behavior: Behavior normal.      UC Treatments / Results  Labs (all labs ordered are listed,  but only abnormal results are displayed) Labs Reviewed  POCT INFLUENZA A/B    EKG None  Radiology Dg Chest 2 View  Result Date: 12/12/2018 CLINICAL DATA:  Cough, Congestion EXAM: CHEST - 2 VIEW COMPARISON:  11/05/2018 FINDINGS: There is mild right basilar atelectasis. There is no focal consolidation. There is no pleural effusion or pneumothorax. The heart and mediastinal contours are unremarkable. The osseous structures are unremarkable. IMPRESSION: No active cardiopulmonary disease. Electronically Signed   By: Kathreen Devoid   On: 12/12/2018 10:30    Procedures Procedures (including critical care time)  Medications Ordered in UC Medications  ipratropium-albuterol (DUONEB) 0.5-2.5 (3) MG/3ML nebulizer solution 3 mL (3 mLs Nebulization Given 12/12/18 1043)    Initial Impression / Assessment and Plan / UC Course  I have reviewed the triage vital signs and the nursing notes.  Pertinent labs & imaging results that were available during my care of the patient were reviewed by me and considered in my medical decision making (see chart for details).     Rapid flu: Negative CXR: WNL O2 Sat 94% on RA Lung sounds did improve after duoneb tx Encouraged monitoring his O2 Sat at home and use his incentive spirometer at home more frequently  Pt feels comfortable being discharged home along with his wife. Discussed symptoms that warrant emergent care in the ED.  Final Clinical Impressions(s) / UC Diagnoses   Final diagnoses:  Flu-like symptoms  Upper respiratory tract infection, unspecified type     Discharge Instructions      Please follow up with your family doctor by early next week if not improving, sooner if worsening.  Call 911 or go to the hospital if worsening this evening.     ED Prescriptions    None  Controlled Substance Prescriptions [place] Controlled Substance Registry consulted? Not Applicable   Tyrell Antonio 12/12/18 1241

## 2018-12-21 ENCOUNTER — Other Ambulatory Visit: Payer: Self-pay | Admitting: Family Medicine

## 2018-12-21 ENCOUNTER — Telehealth: Payer: Self-pay | Admitting: *Deleted

## 2018-12-21 MED ORDER — GERHARDT'S BUTT CREAM: 1.0000 "application " | TOPICAL_CREAM | Freq: Four times a day (QID) | CUTANEOUS | 3 refills | Status: DC

## 2018-12-21 MED ORDER — CLOTRIMAZOLE-BETAMETHASONE 1-0.05 % EX CREA: TOPICAL_CREAM | CUTANEOUS | 11 refills | Status: DC

## 2018-12-21 NOTE — Telephone Encounter (Signed)
Pls notify wife that I'm sending eRx for lotrisone generic cream, which should be very effective for his problem.-thx

## 2018-12-21 NOTE — Telephone Encounter (Signed)
Copied from [place] [id number]. Topic: General - Other >> Dec 21, 2018  4:04 PM Judyann Munson wrote: Reason for CRM:  [place] is calling to state the medication Hydrocortisone (GERHARDT'S BUTT CREAM) CREAM can not be located, they are requesting a call back. Please advise

## 2018-12-21 NOTE — Telephone Encounter (Signed)
Tried calling pts wife, NA and unable to leave a message due to vm box being full or not set up yet.

## 2018-12-21 NOTE — Telephone Encounter (Signed)
Dr. Anitra Lauth do you want to send something else in or do you want me to see if another pharmacy carries it? Please advise. Thanks.

## 2018-12-24 NOTE — Telephone Encounter (Signed)
Pts wife advised and voiced understanding, okay per DPR. 

## 2019-01-08 DIAGNOSIS — G7111 Myotonic muscular dystrophy: Secondary | ICD-10-CM | POA: Diagnosis not present

## 2019-01-16 ENCOUNTER — Ambulatory Visit: Payer: 59 | Admitting: Family Medicine

## 2019-01-25 ENCOUNTER — Telehealth: Payer: Self-pay | Admitting: Family Medicine

## 2019-01-25 NOTE — Telephone Encounter (Signed)
Copied from [place] [id number]. Topic: Quick Communication - See Telephone Encounter >> Jan 25, 2019 11:32 AM [name] wrote: CRM for notification. See Telephone encounter for: 01/25/19.  Patient would like to know could he go back to the shorter acting amphetamine-dextroamphetamine capsule. His wife states that he has some from when he was changed to amphetamine-dextroamphetamine (ADDERALL XR) 30 MG 24 hr capsule and could use those. He is currently out of amphetamine-dextroamphetamine (ADDERALL XR) 30 MG 24 hr capsule. She just rescheduled his appt from when Dr Anitra Lauth was out of the office on 1/22. He will be coming in 2/18. Please contact his wife [name] @ [phone]

## 2019-01-25 NOTE — Telephone Encounter (Addendum)
SW pts wife, she stated that per pt the plan adderall worked better. Pts wife doesn't agree with this. She wants to know if it will be okay for pt to take the plan adderall TID until his apt on the 18th. He is currently our of adderall XR.   Please advise. Thanks.

## 2019-01-25 NOTE — Telephone Encounter (Signed)
Sure he can.  Does he still have some short acting adderall at home or does he need a rx?

## 2019-01-25 NOTE — Telephone Encounter (Signed)
Pts wife advised and voiced understanding. She stated that she can't remember how much he has but knows he has at least enough for next week. She will call back if pt needs a refill.

## 2019-02-11 ENCOUNTER — Encounter: Payer: Self-pay | Admitting: *Deleted

## 2019-02-12 ENCOUNTER — Ambulatory Visit: Payer: 59 | Admitting: Family Medicine

## 2019-02-12 ENCOUNTER — Encounter: Payer: Self-pay | Admitting: Family Medicine

## 2019-02-12 VITALS — BP 114/78 | HR 92 | Resp 16 | Ht 67.0 in | Wt 197.2 lb

## 2019-02-12 DIAGNOSIS — F339 Major depressive disorder, recurrent, unspecified: Secondary | ICD-10-CM | POA: Diagnosis not present

## 2019-02-12 DIAGNOSIS — G4719 Other hypersomnia: Secondary | ICD-10-CM

## 2019-02-12 DIAGNOSIS — F988 Other specified behavioral and emotional disorders with onset usually occurring in childhood and adolescence: Secondary | ICD-10-CM

## 2019-02-12 DIAGNOSIS — G7111 Myotonic muscular dystrophy: Secondary | ICD-10-CM

## 2019-02-12 MED ORDER — LAMOTRIGINE 25 MG PO TABS: ORAL_TABLET | ORAL | 0 refills | Status: DC

## 2019-02-12 MED ORDER — AMPHETAMINE-DEXTROAMPHET ER 10 MG PO CP24: ORAL_CAPSULE | ORAL | 0 refills | Status: DC

## 2019-02-12 MED ORDER — AMPHETAMINE-DEXTROAMPHET ER 30 MG PO CP24: ORAL_CAPSULE | ORAL | 0 refills | Status: DC

## 2019-02-12 NOTE — Progress Notes (Signed)
OFFICE VISIT  02/12/2019   CC:  Chief Complaint  Patient presents with  . Follow-up    RCI, pt is not fasting   HPI:    Patient is a 58 y.o. Caucasian male who presents accompanied by his wife for 2 mo f/u adult FTT, adult ADD, recurrent MDD.  MDD: has been on fluoxetine at 40mg  qd dosing for the last 2 and 1/2 months. Pt can't tell (nor can wife) that there has been any appreciable difference on this dosing.  No adverse effects either. Denies SI or HI.  +Anhedonia, poor motivation, hopelessness, poor energy/excessive sleeping, poor concentration.  ADD/excessive daytime somnolence: continued with adderall xr 30mg  qd at last f/u visit.  However, pt decided that the IR/short acting adderall was working better so he switched back to it about 3 wks ago. However, he has only able to take med once a day per his wife's report.  This has been a problem in the past with using meds dosed more than once a day.   It helps some with augmenting mood as well as improving concentration and focus and energy/excessive somnolence--but at this time pt states it is doing these things minimally.  No new complaints.  ROS: no CP, no SOB, no wheezing, no cough, no dizziness, no HAs, no rashes, no melena/hematochezia.  No polyuria or polydipsia.  No myalgias or arthralgias.    Past Medical History:  Diagnosis Date  . Adult ADHD   . Aspiration pneumonitis ([place]) 04/2017, 08/2017   Recurrent  . Chronic respiratory failure ([place])    As of 04/2018, Dr. Chase Caller suspects hypercapnic RF--but [name] normal.  May eventually need nocturnal noninvasive ventilation.  Ultimately, if progresses enough pt will need trach.  . Constipation, chronic    Has caused irritative urinary/bladder symptoms in the past.  . Depression   . Dizziness 06/2011+   Noncontrast CT and noncontrast MRI both negative 06/2011 at [facility] ED visit.  . Elevated transaminase level    Liver biopsy revealed fatty liver  . Erectile dysfunction   . Facial  fracture ([place]) 06/2011   Nondisplaced bilateral LeFort I fracture: no surgery required. (ENT, Dr. Wilburn Cornelia)   . Fatty liver disease, nonalcoholic   . GERD (gastroesophageal reflux disease)   . History of thyroid cancer 2010   Medullary carcinoma of thyroid. Thyroidectomy 08/2009 (Dr. Aida Puffer at [facility]).  Needs periodic serum calcitonin monitoring.  Marland Kitchen Hyperlipidemia   . Hypothyroidism   . LVH (left ventricular hypertrophy)    by EKG criteria 06/2011.  Hx of heart murmur--Followed by cardiology at [facility]: EF's have been "stable" per cardio office note 12/29/10.  His echo 12/2012 showed normal LV size and wall thickness but EF 45-50% and global LV hypokinesis was found.  . Myotonic dystrophy ([place]) Dx'd 1996   Progressive muscle weakness and swallowing dysfunction.  Initial dx by Dr. Jannifer Franklin, neurologist, but subsequent annual specialist f/u has been with Dr. Ladon Applebaum [facility].  Progressive debilitation--PT referral 04/2017, pt quit PT 05/2017.  . Nephrolithiasis   . Obesity, Class I, BMI 30-34.9   . OSA (obstructive sleep apnea)    does not wear CPAP  . Pulmonary nodule, right 05/2017   RUL, 7 mm sub solid nodule.  Resolution noted on f/u CT 08/2017.    Past Surgical History:  Procedure Laterality Date  . COLONOSCOPY  09/13/2007   normal (Dr. Deatra Ina).  . EXTRACORPOREAL SHOCK WAVE LITHOTRIPSY    . THYROIDECTOMY  08/2009   for medullary thyroid cancer (Dr. Conley Canal  at [facility] follows him for this).  . TRANSTHORACIC ECHOCARDIOGRAM  12/2012; 09/2017   2014-Normal LV size and wall thickness, LV EF 45-50%, with global hypokinesis of LV.  2018- EF 50-55%, normal wall motion, grd I DD, valves normal.    Outpatient Medications Prior to Visit  Medication Sig Dispense Refill  . acetaminophen (TYLENOL) 325 MG tablet Take 1-2 tablets (325-650 mg total) by mouth every 4 (four) hours as needed for mild pain.    . clotrimazole-betamethasone (LOTRISONE) cream Apply to affected area bid prn 45 g 11  .  FLUoxetine (PROZAC) 40 MG capsule Take 1 capsule (40 mg total) by mouth daily. 90 capsule 3  . levothyroxine (SYNTHROID, LEVOTHROID) 150 MCG tablet TAKE 1 TABLET BY MOUTH ONCE DAILY EXCEPT FOR Sunday  SKIP DOSE 30 tablet 1  . omeprazole (PRILOSEC) 40 MG capsule TAKE 1 CAPSULE BY MOUTH ONCE DAILY 90 capsule 2  . traZODone (DESYREL) 100 MG tablet TAKE 1 TABLET BY MOUTH AT BEDTIME AS NEEDED FOR SLEEP. (Patient taking differently: Take 100 mg by mouth at bedtime as needed for sleep. ) 30 tablet 11  . amphetamine-dextroamphetamine (ADDERALL XR) 30 MG 24 hr capsule Take 1 capsule (30 mg total) by mouth every morning. (Patient not taking: Reported on 02/12/2019) 30 capsule 0   No facility-administered medications prior to visit.     No Known Allergies  ROS As per HPI  PE: Blood pressure 114/78, pulse 92, resp. rate 16, height 5\' 7"  (1.702 m), weight 197 lb 3.2 oz (89.4 kg), SpO2 94 %. Gen: Alert, well appearing.  Patient is oriented to person, place, time, and situation. AFFECT: very flat, speaks slowly and doesn't enunciate well--this is per his usual. No further exam today.  LABS:    Chemistry      Component Value Date/Time   NA 140 11/16/2018 1142   K 5.3 (H) 11/16/2018 1142   CL 101 11/16/2018 1142   CO2 33 (H) 11/16/2018 1142   BUN 13 11/16/2018 1142   CREATININE 0.91 11/16/2018 1142   CREATININE 0.95 02/20/2013 1604      Component Value Date/Time   CALCIUM 10.1 11/16/2018 1142   ALKPHOS 78 11/03/2018 0551   AST 29 11/03/2018 0551   ALT 20 11/03/2018 0551   BILITOT 0.7 11/03/2018 0551     Lab Results  Component Value Date   WBC 4.4 11/16/2018   HGB 15.3 11/16/2018   HCT 46.1 11/16/2018   MCV 93.1 11/16/2018   PLT 192.0 11/16/2018   Lab Results  Component Value Date   TSH 2.335 10/31/2018    IMPRESSION AND PLAN:  1) MDD: continue fluoxetine 40mg  qd and I will add lamictal today: 25mg  qd x 15d, then 50mg  qd until I see him again in 1 mo.  Therapeutic expectations and  side effect profile of medication discussed today.  Patient's questions answered. Fortunately, his adderall does augment his mood some.  2) ADD/excessive daytime sleepiness: will increase adderall xr (which he has likely developed a significant tolerance to) to total qd dose of 40mg  (30mg  cap + 10mg  cap qAM).   Needs to f/u with pulm MD to discuss further eval for sleep apnea (central and/or obstructive).  3) Myotonic dystrophy: gradually progressive weakness and debilitation.  He finds himself being able to do less and less and this frustration is undoubtedly fueling some of his depression. He uses rolling walker. Continue to observe/support.  Recent f/u with specialist 12/2018 at [facility] did not result in any changes in mgmt.  An After Visit Summary was printed and given to the patient.  FOLLOW UP: Return in about 4 weeks (around 03/12/2019) for f/u ADD/excessive somnolence/MDD.  Signed:  Crissie Sickles, MD           02/12/2019

## 2019-02-26 ENCOUNTER — Other Ambulatory Visit: Payer: Self-pay | Admitting: Family Medicine

## 2019-03-11 ENCOUNTER — Encounter: Payer: Self-pay | Admitting: Family Medicine

## 2019-03-11 NOTE — Telephone Encounter (Signed)
Okay to r/s f/u for later date? Please advise. Thanks.

## 2019-03-12 ENCOUNTER — Encounter: Payer: Self-pay | Admitting: Family Medicine

## 2019-03-12 NOTE — Telephone Encounter (Signed)
Please advise. Thanks.  

## 2019-03-13 ENCOUNTER — Ambulatory Visit: Payer: 59 | Admitting: Family Medicine

## 2019-03-14 ENCOUNTER — Other Ambulatory Visit: Payer: Self-pay | Admitting: Family Medicine

## 2019-03-14 ENCOUNTER — Ambulatory Visit: Payer: 59 | Admitting: Family Medicine

## 2019-03-14 MED ORDER — LAMOTRIGINE 100 MG PO TABS: 100.0000 mg | ORAL_TABLET | Freq: Every day | ORAL | 2 refills | Status: DC

## 2019-03-14 NOTE — Telephone Encounter (Signed)
I'm going to increase his lamictal to 100mg  per day. I'll send in rx now.-thx

## 2019-03-18 ENCOUNTER — Other Ambulatory Visit: Payer: Self-pay | Admitting: Family Medicine

## 2019-03-18 NOTE — Telephone Encounter (Signed)
RF request for adderall 10 mg.  Last OV 02/12/2019 Next OV 05/23/2019 Last RX 02/12/2019 # 30 no refills.  Please advise.  Pharmacy: [facility]

## 2019-04-23 ENCOUNTER — Other Ambulatory Visit: Payer: Self-pay | Admitting: Family Medicine

## 2019-04-24 ENCOUNTER — Other Ambulatory Visit: Payer: Self-pay | Admitting: Family Medicine

## 2019-04-24 MED ORDER — AMPHETAMINE-DEXTROAMPHET ER 30 MG PO CP24: ORAL_CAPSULE | ORAL | 0 refills | Status: DC

## 2019-04-24 MED ORDER — AMPHETAMINE-DEXTROAMPHET ER 10 MG PO CP24: ORAL_CAPSULE | ORAL | 0 refills | Status: DC

## 2019-04-24 NOTE — Telephone Encounter (Signed)
RF request for Adderall XR 10mg  and 30mg   LOV: 02/12/2019 Next ov: 05/23/2019 Last written: Adderall 10mg  03/18/19 #30  No refills  Adderall 30mg  02/12/19 #30 No refills   Please advise

## 2019-05-23 ENCOUNTER — Encounter: Payer: 59 | Admitting: Family Medicine

## 2019-05-29 ENCOUNTER — Other Ambulatory Visit: Payer: Self-pay | Admitting: Family Medicine

## 2019-06-17 ENCOUNTER — Other Ambulatory Visit: Payer: Self-pay | Admitting: Family Medicine

## 2019-06-17 NOTE — Telephone Encounter (Signed)
Do not see this medication on med list. Only similar med is Lamotrigine 100mg  tabs. Last Rx sent 03/14/19 (30,2) and last OV was 02/12/19.  Please advise if appropriate for refill, thanks. Medication pending.

## 2019-07-01 ENCOUNTER — Other Ambulatory Visit: Payer: Self-pay

## 2019-07-01 ENCOUNTER — Encounter: Payer: Self-pay | Admitting: Family Medicine

## 2019-07-01 ENCOUNTER — Ambulatory Visit (INDEPENDENT_AMBULATORY_CARE_PROVIDER_SITE_OTHER): Payer: 59 | Admitting: Family Medicine

## 2019-07-01 VITALS — HR 120

## 2019-07-01 DIAGNOSIS — R531 Weakness: Secondary | ICD-10-CM

## 2019-07-01 DIAGNOSIS — F411 Generalized anxiety disorder: Secondary | ICD-10-CM

## 2019-07-01 DIAGNOSIS — R5381 Other malaise: Secondary | ICD-10-CM

## 2019-07-01 DIAGNOSIS — N39498 Other specified urinary incontinence: Secondary | ICD-10-CM | POA: Diagnosis not present

## 2019-07-01 DIAGNOSIS — E039 Hypothyroidism, unspecified: Secondary | ICD-10-CM

## 2019-07-01 DIAGNOSIS — F988 Other specified behavioral and emotional disorders with onset usually occurring in childhood and adolescence: Secondary | ICD-10-CM | POA: Diagnosis not present

## 2019-07-01 DIAGNOSIS — G7111 Myotonic muscular dystrophy: Secondary | ICD-10-CM

## 2019-07-01 DIAGNOSIS — F331 Major depressive disorder, recurrent, moderate: Secondary | ICD-10-CM | POA: Diagnosis not present

## 2019-07-01 DIAGNOSIS — G4719 Other hypersomnia: Secondary | ICD-10-CM

## 2019-07-01 MED ORDER — MODAFINIL 200 MG PO TABS: 200.0000 mg | ORAL_TABLET | Freq: Every day | ORAL | 1 refills | Status: DC

## 2019-07-01 MED ORDER — LAMOTRIGINE 200 MG PO TABS: 200.0000 mg | ORAL_TABLET | Freq: Every day | ORAL | 1 refills | Status: DC

## 2019-07-01 MED ORDER — TAMSULOSIN HCL 0.4 MG PO CAPS: 0.4000 mg | ORAL_CAPSULE | Freq: Every day | ORAL | 1 refills | Status: DC

## 2019-07-01 NOTE — Progress Notes (Addendum)
Virtual Visit via Video Note  I connected with pt on 07/01/19 at  4:00 PM EDT by a video enabled telemedicine application and verified that I am speaking with the correct person using two identifiers.  Location patient: home Location provider:work or home office Persons participating in the virtual visit: patient, pt's wife, and myself.  I discussed the limitations of evaluation and management by telemedicine and the availability of in person appointments. The patient expressed understanding and agreed to proceed.  Telemedicine visit is a necessity given the COVID-19 restrictions in place at the current time.  HPI: 58 y/o WM being seen today for 4 mo f/u adult ADD with excessive daytime sleepiness, MDD, and hypothyroidism. I started him on lamictal last visit. About 3 mo ago I increased his lamictal to 100mg  qd.  Feels no change in mood.  Anxiety level a bit better. Possibly more drowsy on the med but wife thinks he's just way off on sleep/wake cycle.  After the adderall xr increase last visit he did seem to be more alert and engaged for a few weeks but this wore off.  They are open to a trial of provigil in place of adderall.  New prob: he has chronic incontinence.  Sometimes does not feel urge to urinate, other times feels urge but cannot make it to the bathroom in time.  Has sense of incomplete emptying.  Unclear whether he urinates a normal amount or if stream is strong.  Denies dysuria or abd pain.  No recent fevers or flank pain.  He has developed such generalized weakness due to his progressing myotonic dystrophy that he cannot walk w/out lots of assistance from his wife.  They are requesting a self propelled wheelchair through [facility].  Hypothyroidism: taking T4 correctly.    ROS: no CP, no SOB, no wheezing, no cough, no dizziness, no HAs, no rashes, no melena/hematochezia.    Past Medical History:  Diagnosis Date  . Adult ADHD   . Aspiration pneumonitis ([place]) 04/2017, 08/2017    Recurrent  . Chronic respiratory failure ([place])    As of 04/2018, Dr. Chase Caller suspects hypercapnic RF--but [name] normal.  May eventually need nocturnal noninvasive ventilation.  Ultimately, if progresses enough pt will need trach.  . Constipation, chronic    Has caused irritative urinary/bladder symptoms in the past.  . Depression   . Dizziness 06/2011+   Noncontrast CT and noncontrast MRI both negative 06/2011 at [facility] ED visit.  . Elevated transaminase level    Liver biopsy revealed fatty liver  . Erectile dysfunction   . Facial fracture ([place]) 06/2011   Nondisplaced bilateral LeFort I fracture: no surgery required. (ENT, Dr. Wilburn Cornelia)   . Fatty liver disease, nonalcoholic   . GERD (gastroesophageal reflux disease)   . History of thyroid cancer 2010   Medullary carcinoma of thyroid. Thyroidectomy 08/2009 (Dr. Aida Puffer at [facility]).  Needs periodic serum calcitonin monitoring.  Marland Kitchen Hyperlipidemia   . Hypothyroidism   . LVH (left ventricular hypertrophy)    by EKG criteria 06/2011.  Hx of heart murmur--Followed by cardiology at [facility]: EF's have been "stable" per cardio office note 12/29/10.  His echo 12/2012 showed normal LV size and wall thickness but EF 45-50% and global LV hypokinesis was found.  . 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  Initial dx by Dr. Jannifer Franklin, neurologist, but subsequent annual specialist f/u has been with Dr. Ladon Applebaum [facility].  Progressive debilitation--PT referral 04/2017, pt quit PT 05/2017.  Marland Kitchen  Nephrolithiasis   . Obesity, Class I, BMI 30-34.9   . OSA (obstructive sleep apnea)    does not wear CPAP  . Pulmonary nodule, right 05/2017   RUL, 7 mm sub solid nodule.  Resolution noted on f/u CT 08/2017.    Past Surgical History:  Procedure Laterality Date  . COLONOSCOPY  09/13/2007   normal (Dr. Deatra Ina).  . EXTRACORPOREAL SHOCK WAVE LITHOTRIPSY    . THYROIDECTOMY  08/2009   for medullary thyroid cancer (Dr. Conley Canal  at [facility] follows him for this).  . TRANSTHORACIC ECHOCARDIOGRAM  12/2012; 09/2017   2014-Normal LV size and wall thickness, LV EF 45-50%, with global hypokinesis of LV.  2018- EF 50-55%, normal wall motion, grd I DD, valves normal.    Family History  Problem Relation Age of Onset  . Arthritis Mother   . Hypertension Mother   . Alcohol abuse Father   . Arthritis Father   . Hypertension Father   . Colon cancer Father   . Prostate cancer Father      Current Outpatient Medications:  .  acetaminophen (TYLENOL) 325 MG tablet, Take 1-2 tablets (325-650 mg total) by mouth every 4 (four) hours as needed for mild pain., Disp: , Rfl:  .  amphetamine-dextroamphetamine (ADDERALL XR) 10 MG 24 hr capsule, TAKE 1 CAPSULE BY MOUTH EVERY MORNING (TO BE TAKEN WITH 30MG  XR CAPSULES), Disp: 30 capsule, Rfl: 0 .  amphetamine-dextroamphetamine (ADDERALL XR) 30 MG 24 hr capsule, TAKE 1 CAPSULE BY MOUTH EVERY MORNING, Disp: 30 capsule, Rfl: 0 .  clotrimazole-betamethasone (LOTRISONE) cream, Apply to affected area bid prn, Disp: 45 g, Rfl: 11 .  FLUoxetine (PROZAC) 40 MG capsule, Take 1 capsule (40 mg total) by mouth daily., Disp: 90 capsule, Rfl: 3 .  levothyroxine (SYNTHROID) 150 MCG tablet, TAKE 1 TABLET BY MOUTH ONCE DAILY EXCEPT FOR SUNDAY SKIP DOSE, Disp: 30 tablet, Rfl: 2 .  omeprazole (PRILOSEC) 40 MG capsule, TAKE 1 CAPSULE BY MOUTH ONCE DAILY, Disp: 90 capsule, Rfl: 2 .  Simethicone 125 MG TABS, Take 125 mg by mouth every 6 (six) hours as needed for flatulence., Disp: , Rfl:  .  SUBVENITE 100 MG tablet, TAKE 1 TABLET BY MOUTH ONCE DAILY, Disp: 30 tablet, Rfl: 1 .  traZODone (DESYREL) 100 MG tablet, TAKE 1 TABLET BY MOUTH AT BEDTIME AS NEEDED FOR SLEEP. (Patient taking differently: Take 100 mg by mouth at bedtime as needed for sleep. ), Disp: 30 tablet, Rfl: 11  EXAM:  VITALS per patient if applicable:  GENERAL: alert, oriented, appears tired and a bit disheveled as per his usual.   Pleasant affect,  lucid thought and speech.  Speech slow and a bit garbled due to weakness of muscles in face/jaw/tongue.  HEENT: atraumatic, conjunttiva clear, no obvious abnormalities on inspection of external nose and ears  NECK: normal movements of the head and neck  LUNGS: on inspection no signs of respiratory distress, breathing rate appears normal, no obvious gross SOB, gasping or wheezing  CV: no obvious cyanosis  PSYCH/NEURO: pleasant and cooperative, affect is flat, speech and thought processing grossly intact  LABS: none today  Lab Results  Component Value Date   TSH 2.335 10/31/2018    ASSESSMENT AND PLAN:  Discussed the following assessment and plan:  1) Myotonic dystrophy: gradually progressive. Has had f/u with his specialist this year and nothing else was offered. Will request self propelled wheelchair since he can't walk anymore. Pt requires a lightweight wheelchair due to inability to walk from progressive  generalized weakness due to his myotonic dystrophy.  His mobility limitation prevents him from completing ADL's. Patient is unable to safely ambulate with an assistive device such as rolling walker, cane, or crutch. The patient can safely self-propel a wheelchair but the patient cannot safely operate a wheelchair that weighs >36 lbs.  2) Hypothyroidism: compliant, taking med correctly.  Will recheck TSH at CPE visit in office in 1 mo.  3) MDD, GAD: lamotrigine added to his fluoxetine has not resulted in much except possibly a little less anxiety. Increase lamotrigine to 200 mg qd.  If this does not help after 1 mo then will get him weened off this med.  4) ADD with excessive daytime somnolence and chronic fatigue, with MDD. Adderall not helpful much at all anymore. Will d/c adderall and do at least a 1 mo trial of provigil at the 200 mg qd dosing. If seeing some good response in 1 mo, will increase gradually up to max dose of 800 mg qd. Pt still needs to contact  pulmonologist, Dr. Chase Caller, to discuss possible testing for OSA. He has not done this as we discussed b/c of restrictions in place since covid 19 pandemic.  5) Incontinence: he may have some (voluntary) urinary sphincter dysfunction due to his myotonic dystrophy. Also, may be having some BPH with LUTS, even some urinary retention significant enough to cause retention with overflow incontinence. Will start trial of flomax at 0.4mg  qd.  Therapeutic expectations and side effect profile of medication discussed today.  Patient's questions answered.    I discussed the assessment and treatment plan with the patient. The patient was provided an opportunity to ask questions and all were answered. The patient agreed with the plan and demonstrated an understanding of the instructions.   The patient was advised to call back or seek an in-person evaluation if the symptoms worsen or if the condition fails to improve as anticipated.  F/u: keep CPE appt already scheduled for 07/2019.  Signed:  Crissie Sickles, MD           07/01/2019

## 2019-07-03 ENCOUNTER — Telehealth: Payer: Self-pay

## 2019-07-03 NOTE — Telephone Encounter (Signed)
Per PCP, order for self propelling wheelchair faxed to [employer] [phone], attn Blake Divine.

## 2019-07-03 NOTE — Addendum Note (Signed)
Addended by: Gerilyn Nestle on: 07/03/2019 02:14 PM   Modules accepted: Orders

## 2019-07-09 ENCOUNTER — Encounter: Payer: Self-pay | Admitting: Family Medicine

## 2019-07-11 ENCOUNTER — Other Ambulatory Visit: Payer: Self-pay | Admitting: Family Medicine

## 2019-07-12 ENCOUNTER — Other Ambulatory Visit: Payer: Self-pay | Admitting: Family Medicine

## 2019-07-12 IMAGING — DX DG CHEST 1V PORT
1 series · 1 of 1 positions shown · non-contrast
Comparison: CT chest 09/03/2017

CLINICAL DATA: Fall.

EXAM:
PORTABLE CHEST 1 VIEW

[chest]
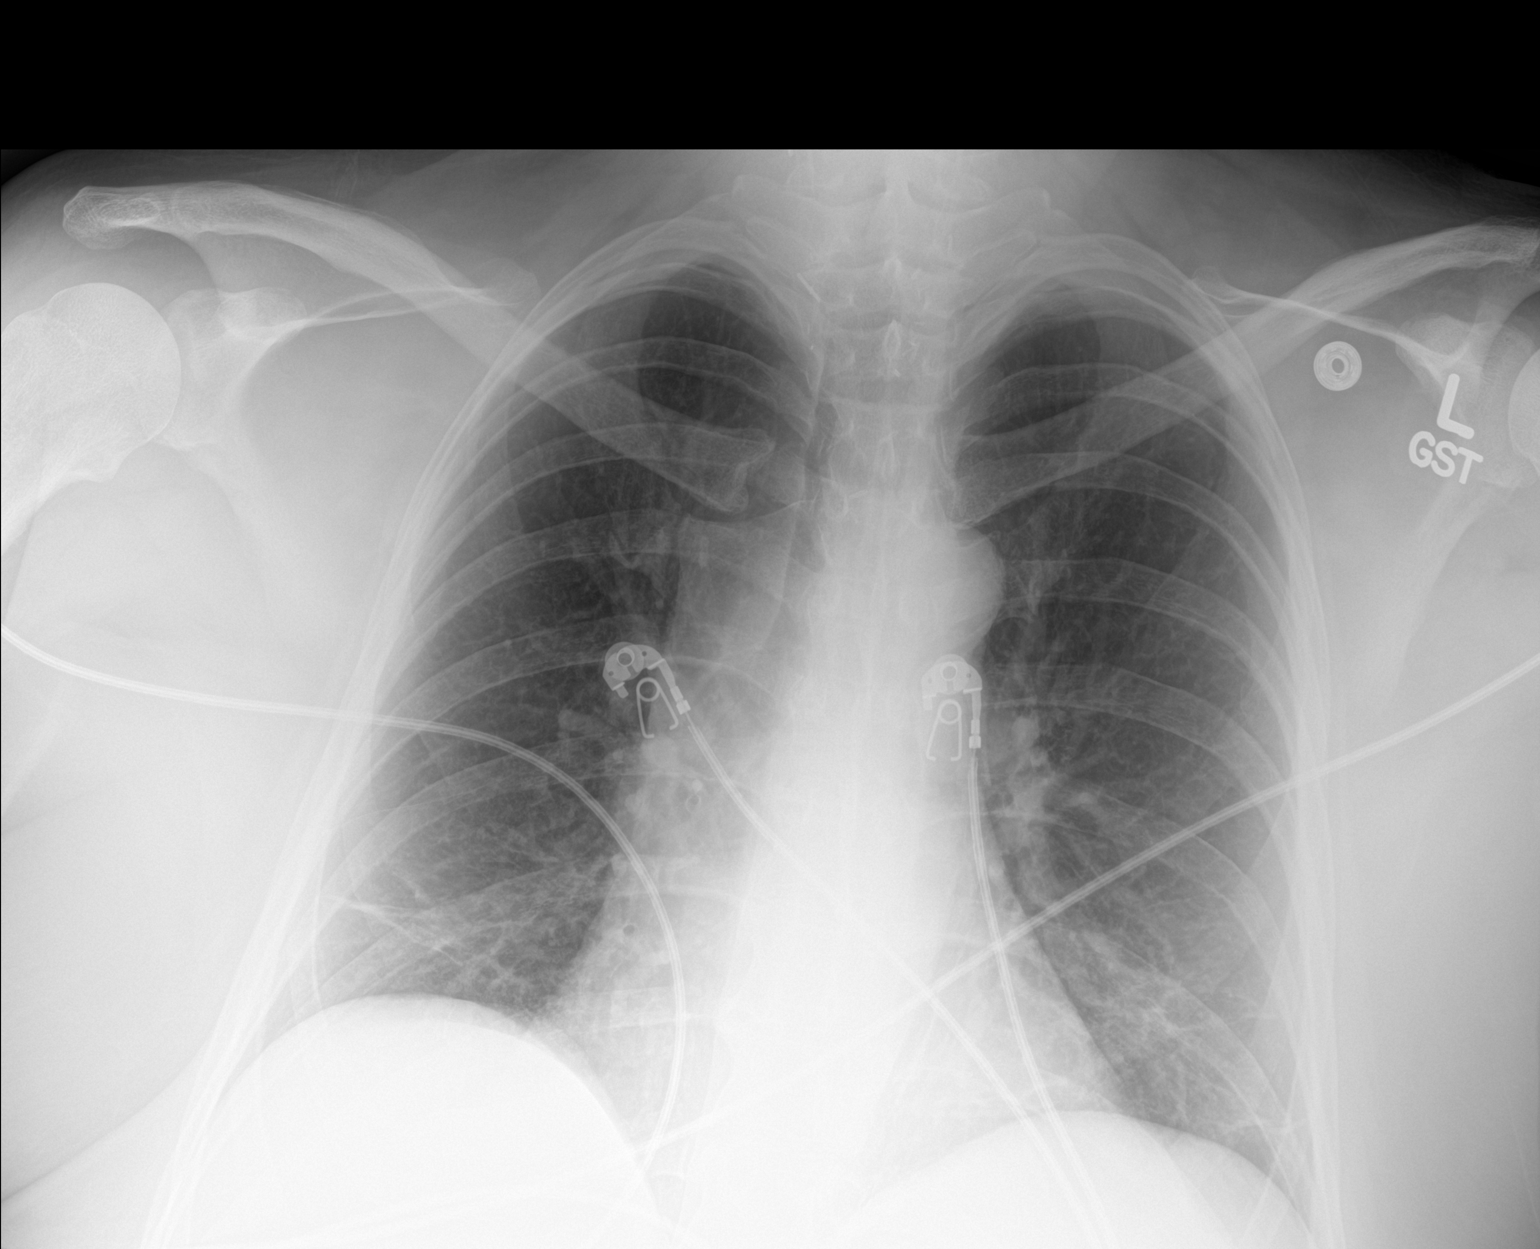

[1 of 1 positions shown; findings below may reference images not displayed]

FINDINGS: Shallow inspiration with linear atelectasis or fibrosis in the lung
bases. No consolidation or airspace disease. No blunting of
costophrenic angles. No pneumothorax. Mediastinal contours appear
intact. Normal heart size and pulmonary vascularity.
IMPRESSION: Shallow inspiration with linear atelectasis or fibrosis in the lung
bases.

## 2019-07-13 ENCOUNTER — Other Ambulatory Visit: Payer: Self-pay | Admitting: Family Medicine

## 2019-07-15 NOTE — Telephone Encounter (Signed)
RF request for trazadone. Last OV 07/01/2019 Next OV 08/02/2019 Last RX 07/04/2018  Please advise RX.

## 2019-07-25 ENCOUNTER — Encounter: Payer: Self-pay | Admitting: Family Medicine

## 2019-07-25 ENCOUNTER — Other Ambulatory Visit: Payer: Self-pay

## 2019-07-25 ENCOUNTER — Telehealth: Payer: Self-pay | Admitting: Family Medicine

## 2019-07-25 ENCOUNTER — Ambulatory Visit (INDEPENDENT_AMBULATORY_CARE_PROVIDER_SITE_OTHER): Payer: 59 | Admitting: Family Medicine

## 2019-07-25 VITALS — HR 122 | Temp 99.1°F

## 2019-07-25 DIAGNOSIS — R112 Nausea with vomiting, unspecified: Secondary | ICD-10-CM

## 2019-07-25 DIAGNOSIS — K529 Noninfective gastroenteritis and colitis, unspecified: Secondary | ICD-10-CM | POA: Diagnosis not present

## 2019-07-25 MED ORDER — ONDANSETRON HCL 8 MG PO TABS: 8.0000 mg | ORAL_TABLET | Freq: Three times a day (TID) | ORAL | 1 refills | Status: DC | PRN

## 2019-07-25 NOTE — Telephone Encounter (Signed)
See if you can get him to do a virtual visit this afternoon--can be put on at 4 oclock with faye.-thx

## 2019-07-25 NOTE — Telephone Encounter (Signed)
Patient son reports patient nausea/vomiting x 2 days, fever 99.1, cough Requesting prescription for [facility]  Please advise.

## 2019-07-25 NOTE — Telephone Encounter (Signed)
Pt was available for virtual visit and scheduled appropriately.

## 2019-07-25 NOTE — Progress Notes (Signed)
Virtual Visit via Video Note  I connected with pt on 07/25/19 at  4:00 PM EDT by a video enabled telemedicine application and verified that I am speaking with the correct person using two identifiers.  Location patient: home Location provider:work or home office Persons participating in the virtual visit: patient, pt's wife, myself.  I discussed the limitations of evaluation and management by telemedicine and the availability of in person appointments. The patient expressed understanding and agreed to proceed.  Telemedicine visit is a necessity given the COVID-19 restrictions in place at the current time.  HPI: 58 y/o WM being seen today for nausea and vomiting.  Wife gives history along with pt. Onset 2 d/a.   Able to tolerate liquids and solids now, seems to be bit better today. No diarrhea.  No fever.  Tm 99.1. Coughing more than usual, last 2 weeks, esp hs.  No SOB. No swelling of legs.  Increased fatigue/weakness.  No signif abd pain. Denies cough/gag with eating last couple weeks (until 2 d/a). No known sick contacts.  Staying inside all the time since covid pandemic.  No signif improvement in excessive daytime sleepiness with modafanil in the last few months.  ROS: See pertinent positives and negatives per HPI.  Past Medical History:  Diagnosis Date  . Adult ADHD   . Aspiration pneumonitis ([place]) 04/2017, 08/2017   Recurrent  . Chronic respiratory failure ([place])    As of 04/2018, Dr. Chase Caller suspects hypercapnic RF--but [name] normal.  May eventually need nocturnal noninvasive ventilation.  Ultimately, if progresses enough pt will need trach.  . Constipation, chronic    Has caused irritative urinary/bladder symptoms in the past.  . Depression   . Dizziness 06/2011+   Noncontrast CT and noncontrast MRI both negative 06/2011 at [facility] ED visit.  . Elevated transaminase level    Liver biopsy revealed fatty liver  . Erectile dysfunction   . Facial fracture ([place]) 06/2011   Nondisplaced bilateral LeFort I fracture: no surgery required. (ENT, Dr. Wilburn Cornelia)   . Fatty liver disease, nonalcoholic   . GERD (gastroesophageal reflux disease)   . History of thyroid cancer 2010   Medullary carcinoma of thyroid. Thyroidectomy 08/2009 (Dr. Aida Puffer at [facility]).  Needs periodic serum calcitonin monitoring.  Marland Kitchen Hyperlipidemia   . Hypothyroidism   . LVH (left ventricular hypertrophy)    by EKG criteria 06/2011.  Hx of heart murmur--Followed by cardiology at [facility]: EF's have been "stable" per cardio office note 12/29/10.  His echo 12/2012 showed normal LV size and wall thickness but EF 45-50% and global LV hypokinesis was found.  . 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  Initial dx by Dr. Jannifer Franklin, neurologist, but subsequent annual specialist f/u has been with Dr. Ladon Applebaum [facility].  Progressive debilitation--PT referral 04/2017, pt quit PT 05/2017.  . Nephrolithiasis   . Obesity, Class I, BMI 30-34.9   . OSA (obstructive sleep apnea)    does not wear CPAP  . Pulmonary nodule, right 05/2017   RUL, 7 mm sub solid nodule.  Resolution noted on f/u CT 08/2017.    Past Surgical History:  Procedure Laterality Date  . COLONOSCOPY  09/13/2007   normal (Dr. Deatra Ina).  . EXTRACORPOREAL SHOCK WAVE LITHOTRIPSY    . THYROIDECTOMY  08/2009   for medullary thyroid cancer (Dr. Conley Canal at [facility] follows him for this).  . TRANSTHORACIC ECHOCARDIOGRAM  12/2012; 09/2017   2014-Normal LV size and wall thickness, LV EF 45-50%, with global  hypokinesis of LV.  2018- EF 50-55%, normal wall motion, grd I DD, valves normal.    Family History  Problem Relation Age of Onset  . Arthritis Mother   . Hypertension Mother   . Alcohol abuse Father   . Arthritis Father   . Hypertension Father   . Colon cancer Father   . Prostate cancer Father      Current Outpatient Medications:  .  acetaminophen (TYLENOL) 325 MG tablet, Take 1-2 tablets (325-650 mg  total) by mouth every 4 (four) hours as needed for mild pain., Disp: , Rfl:  .  clotrimazole-betamethasone (LOTRISONE) cream, Apply to affected area bid prn, Disp: 45 g, Rfl: 11 .  FLUoxetine (PROZAC) 40 MG capsule, Take 1 capsule (40 mg total) by mouth daily., Disp: 90 capsule, Rfl: 3 .  lamoTRIgine (LAMICTAL) 200 MG tablet, Take 1 tablet (200 mg total) by mouth daily., Disp: 30 tablet, Rfl: 1 .  levothyroxine (SYNTHROID) 150 MCG tablet, TAKE 1 TABLET BY MOUTH ONCE DAILY EXCEPT FOR SUNDAY SKIP DOSE, Disp: 30 tablet, Rfl: 2 .  modafinil (PROVIGIL) 200 MG tablet, Take 1 tablet (200 mg total) by mouth daily., Disp: 30 tablet, Rfl: 1 .  omeprazole (PRILOSEC) 40 MG capsule, TAKE 1 CAPSULE BY MOUTH ONCE DAILY, Disp: 90 capsule, Rfl: 2 .  Simethicone 125 MG TABS, Take 125 mg by mouth every 6 (six) hours as needed for flatulence., Disp: , Rfl:  .  tamsulosin (FLOMAX) 0.4 MG CAPS capsule, Take 1 capsule (0.4 mg total) by mouth daily., Disp: 30 capsule, Rfl: 1 .  traZODone (DESYREL) 100 MG tablet, TAKE 1 TABLET BY MOUTH AT BEDTIME AS NEEDED FOR SLEEP., Disp: 90 tablet, Rfl: 3  EXAM:  VITALS per patient if applicable:  GENERAL: alert, oriented, appears well and in no acute distress  HEENT: atraumatic, conjunttiva clear, no obvious abnormalities on inspection of external nose and ears  NECK: normal movements of the head and neck  LUNGS: on inspection no signs of respiratory distress, breathing rate appears normal, no obvious gross SOB, gasping or wheezing  CV: no obvious cyanosis  MS: moves all visible extremities without noticeable abnormality  PSYCH/NEURO: pleasant and cooperative, no obvious depression or anxiety, speech and thought processing grossly intact  LABS: none today    Chemistry      Component Value Date/Time   NA 140 11/16/2018 1142   K 5.3 (H) 11/16/2018 1142   CL 101 11/16/2018 1142   CO2 33 (H) 11/16/2018 1142   BUN 13 11/16/2018 1142   CREATININE 0.91 11/16/2018 1142    CREATININE 0.95 02/20/2013 1604      Component Value Date/Time   CALCIUM 10.1 11/16/2018 1142   ALKPHOS 78 11/03/2018 0551   AST 29 11/03/2018 0551   ALT 20 11/03/2018 0551   BILITOT 0.7 11/03/2018 0551     Lab Results  Component Value Date   TSH 2.335 10/31/2018   Lab Results  Component Value Date   WBC 4.4 11/16/2018   HGB 15.3 11/16/2018   HCT 46.1 11/16/2018   MCV 93.1 11/16/2018   PLT 192.0 11/16/2018    ASSESSMENT AND PLAN:  Discussed the following assessment and plan:  Acute gastroenteritis, suspect viral. Improving last 12 hours or so. Push fluids, will eRx zofran 8mg  tid prn. Signs/symptoms to call or return for were reviewed and pt expressed understanding.   I discussed the assessment and treatment plan with the patient. The patient was provided an opportunity to ask questions and all were answered.  The patient agreed with the plan and demonstrated an understanding of the instructions.   The patient was advised to call back or seek an in-person evaluation if the symptoms worsen or if the condition fails to improve as anticipated.  F/u: keep appt set for next week for chronic  Med f/u.  Signed:  Crissie Sickles, MD           07/25/2019

## 2019-07-25 NOTE — Telephone Encounter (Signed)
Please advise, thanks. Pt has never been prescribed Zofran.

## 2019-07-26 ENCOUNTER — Ambulatory Visit: Payer: 59 | Admitting: Family Medicine

## 2019-07-30 ENCOUNTER — Encounter: Payer: Self-pay | Admitting: Family Medicine

## 2019-07-30 ENCOUNTER — Telehealth: Payer: Self-pay | Admitting: Family Medicine

## 2019-07-30 NOTE — Telephone Encounter (Signed)
Copied from [place]anda [phone]. Topic: Appointment Scheduling - Scheduling Inquiry for Clinic >> Jul 30, 2019  1:22 PM Oneta Rack wrote: [name] Human name; Mrs. Gumz Relation to pt: spouse Call back number: [phone]   Reason for call:  Patient experiencing 1 big blister on shoulder blade , 4 blotches on his back looks like a bug bite, blotches appeared within a 2 day span, patient spouse states that PCP stated new medication might cause rash, seeking clinical advice

## 2019-07-30 NOTE — Telephone Encounter (Signed)
Spoke with patients wife, [name].  Wife reports patient has red bumps and large fluid filled cyst/blister on back x 2 days.  Patient was prescribed Zofran at recent OV, but has never taken a dose. Wife to send pics via mychart account. Okay with waiting for PCP to see pics/discuss in AM.

## 2019-07-31 NOTE — Telephone Encounter (Signed)
Stop lamotrigine (lamictal). Warm compress. Cover area with gauze after vesicle opens/drains.  Update me on how things are going/how he is feeling.-thx

## 2019-08-01 ENCOUNTER — Other Ambulatory Visit: Payer: Self-pay

## 2019-08-02 ENCOUNTER — Encounter: Payer: Self-pay | Admitting: Family Medicine

## 2019-08-02 ENCOUNTER — Telehealth: Payer: Self-pay

## 2019-08-02 ENCOUNTER — Ambulatory Visit: Payer: 59 | Admitting: Family Medicine

## 2019-08-02 ENCOUNTER — Other Ambulatory Visit: Payer: Self-pay

## 2019-08-02 VITALS — BP 126/85 | HR 84 | Temp 97.6°F | Resp 16 | Ht 67.0 in | Wt 210.0 lb

## 2019-08-02 DIAGNOSIS — K760 Fatty (change of) liver, not elsewhere classified: Secondary | ICD-10-CM | POA: Diagnosis not present

## 2019-08-02 DIAGNOSIS — G4733 Obstructive sleep apnea (adult) (pediatric): Secondary | ICD-10-CM

## 2019-08-02 DIAGNOSIS — L989 Disorder of the skin and subcutaneous tissue, unspecified: Secondary | ICD-10-CM | POA: Diagnosis not present

## 2019-08-02 DIAGNOSIS — E039 Hypothyroidism, unspecified: Secondary | ICD-10-CM

## 2019-08-02 DIAGNOSIS — Z0000 Encounter for general adult medical examination without abnormal findings: Secondary | ICD-10-CM

## 2019-08-02 DIAGNOSIS — Z85850 Personal history of malignant neoplasm of thyroid: Secondary | ICD-10-CM | POA: Diagnosis not present

## 2019-08-02 DIAGNOSIS — R351 Nocturia: Secondary | ICD-10-CM

## 2019-08-02 DIAGNOSIS — R400 Somnolence: Secondary | ICD-10-CM

## 2019-08-02 DIAGNOSIS — E785 Hyperlipidemia, unspecified: Secondary | ICD-10-CM

## 2019-08-02 DIAGNOSIS — N401 Enlarged prostate with lower urinary tract symptoms: Secondary | ICD-10-CM

## 2019-08-02 DIAGNOSIS — T50905A Adverse effect of unspecified drugs, medicaments and biological substances, initial encounter: Secondary | ICD-10-CM

## 2019-08-02 DIAGNOSIS — Z0001 Encounter for general adult medical examination with abnormal findings: Secondary | ICD-10-CM | POA: Diagnosis not present

## 2019-08-02 DIAGNOSIS — Z125 Encounter for screening for malignant neoplasm of prostate: Secondary | ICD-10-CM

## 2019-08-02 DIAGNOSIS — F988 Other specified behavioral and emotional disorders with onset usually occurring in childhood and adolescence: Secondary | ICD-10-CM | POA: Diagnosis not present

## 2019-08-02 LAB — CBC WITH DIFFERENTIAL/PLATELET
Basophils Absolute: 0 10*3/uL (ref 0.0–0.1)
Basophils Relative: 0.7 % (ref 0.0–3.0)
Eosinophils Absolute: 0.1 10*3/uL (ref 0.0–0.7)
Eosinophils Relative: 1.5 % (ref 0.0–5.0)
HCT: 43.3 % (ref 39.0–52.0)
Hemoglobin: 14 g/dL (ref 13.0–17.0)
Lymphocytes Relative: 19 % (ref 12.0–46.0)
Lymphs Abs: 1.2 10*3/uL (ref 0.7–4.0)
MCHC: 32.4 g/dL (ref 30.0–36.0)
MCV: 93.8 fl (ref 78.0–100.0)
Monocytes Absolute: 0.5 10*3/uL (ref 0.1–1.0)
Monocytes Relative: 8 % (ref 3.0–12.0)
Neutro Abs: 4.3 10*3/uL (ref 1.4–7.7)
Neutrophils Relative %: 70.8 % (ref 43.0–77.0)
Platelets: 232 10*3/uL (ref 150.0–400.0)
RBC: 4.62 Mil/uL (ref 4.22–5.81)
RDW: 15.4 % (ref 11.5–15.5)
WBC: 6.1 10*3/uL (ref 4.0–10.5)

## 2019-08-02 LAB — COMPREHENSIVE METABOLIC PANEL
ALT: 15 U/L (ref 0–53)
AST: 20 U/L (ref 0–37)
Albumin: 3.9 g/dL (ref 3.5–5.2)
Alkaline Phosphatase: 84 U/L (ref 39–117)
BUN: 14 mg/dL (ref 6–23)
CO2: 29 mEq/L (ref 19–32)
Calcium: 9.8 mg/dL (ref 8.4–10.5)
Chloride: 96 mEq/L (ref 96–112)
Creatinine, Ser: 0.8 mg/dL (ref 0.40–1.50)
GFR: 99.02 mL/min (ref >60.00)
Glucose, Bld: 80 mg/dL (ref 70–99)
Potassium: 4.2 mEq/L (ref 3.5–5.1)
Sodium: 137 mEq/L (ref 135–145)
Total Bilirubin: 0.4 mg/dL (ref 0.2–1.2)
Total Protein: 6.8 g/dL (ref 6.0–8.3)

## 2019-08-02 LAB — PSA: PSA: 0.35 ng/mL (ref 0.10–4.00)

## 2019-08-02 LAB — TSH: TSH: 6.39 u[IU]/mL — ABNORMAL HIGH (ref 0.35–4.50)

## 2019-08-02 MED ORDER — AMPHETAMINE-DEXTROAMPHET ER 30 MG PO CP24: 30.0000 mg | ORAL_CAPSULE | ORAL | 0 refills | Status: DC

## 2019-08-02 MED ORDER — GLYCOPYRROLATE 2 MG PO TABS: ORAL_TABLET | ORAL | 6 refills | Status: DC

## 2019-08-02 MED ORDER — TAMSULOSIN HCL 0.4 MG PO CAPS: ORAL_CAPSULE | ORAL | 1 refills | Status: DC

## 2019-08-02 NOTE — Telephone Encounter (Signed)
LM at sleep study center [facility]) requesting call back to determine process of in home sleep study. Direct line provided.

## 2019-08-02 NOTE — Progress Notes (Signed)
Office Note 08/02/2019  CC:  Chief Complaint  Patient presents with  . Annual Exam    pt is not fasting    HPI:  [name] is a 58 y.o. White male who is here accompanied by his wife for annual health maintenance exam.  Recent skin lesion on L scapula region.  Popped.  No more pain.  No further lesions. No systemic sx's with this.  He stopped lamictal as instructed. This med had not been helping psych anyway.  His oral secretions are more plentiful last couple months, wife asks if he can get back on robinul. Adult ADD/chronic daytime somnolence: provigil no help.  Go back to adderall XR, as this seems to have helped the best. Pulm wanted him to get sleep study but pt asks if I can order this b/c he has extreme difficulty getting out of his home to go to appt's and this would save him 1 trip. BPH sx's not much improved with recent trial of 0.4mg  flomax qhs.  Overall [name] continues to gradually decline regarding strength and mobility.  Needs WC for mobility 24/7. Needs assistance with bathing, meal prep, and med admin. He can feed himself, toilet w/out assistance.   He continues to decline [facility] nursing assistance.  Past Medical History:  Diagnosis Date  . Adult ADHD   . Aspiration pneumonitis ([place]) 04/2017, 08/2017   Recurrent  . Chronic respiratory failure ([place])    As of 04/2018, Dr. Chase Caller suspects hypercapnic RF--but [name] normal.  May eventually need nocturnal noninvasive ventilation.  Ultimately, if progresses enough pt will need trach.  . Constipation, chronic    Has caused irritative urinary/bladder symptoms in the past.  . Depression   . Dizziness 06/2011+   Noncontrast CT and noncontrast MRI both negative 06/2011 at [facility] ED visit.  . Elevated transaminase level    Liver biopsy revealed fatty liver  . Erectile dysfunction   . Facial fracture ([place]) 06/2011   Nondisplaced bilateral [place] I fracture: no surgery required. (ENT, Dr. Wilburn Cornelia)   . Fatty liver disease,  nonalcoholic   . GERD (gastroesophageal reflux disease)   . History of thyroid cancer 2010   Medullary carcinoma of thyroid. Thyroidectomy 08/2009 (Dr. Aida Puffer at [facility]).  Needs periodic serum calcitonin monitoring.  Marland Kitchen Hyperlipidemia   . Hypothyroidism   . LVH (left ventricular hypertrophy)    by EKG criteria 06/2011.  Hx of heart murmur--Followed by cardiology at [facility]: EF's have been "stable" per cardio office note 12/29/10.  His echo 12/2012 showed normal LV size and wall thickness but EF 45-50% and global LV hypokinesis was found.  . 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  Initial dx by Dr. Jannifer Franklin, neurologist, but subsequent annual specialist f/u has been with Dr. Ladon Applebaum [facility].  Progressive debilitation--PT referral 04/2017, pt quit PT 05/2017.  . Nephrolithiasis   . Obesity, Class I, BMI 30-34.9   . OSA (obstructive sleep apnea)    does not wear CPAP  . Pulmonary nodule, right 05/2017   RUL, 7 mm sub solid nodule.  Resolution noted on f/u CT 08/2017.    Past Surgical History:  Procedure Laterality Date  . COLONOSCOPY  09/13/2007   normal (Dr. Deatra Ina).  . EXTRACORPOREAL SHOCK WAVE LITHOTRIPSY    . THYROIDECTOMY  08/2009   for medullary thyroid cancer ([facility]. Conley Canal at [facility] follows him for this).  . TRANSTHORACIC ECHOCARDIOGRAM  12/2012; 09/2017   2014-Normal LV size and wall thickness, LV  EF 45-50%, with global hypokinesis of LV.  2018- EF 50-55%, normal wall motion, grd I DD, valves normal.    Family History  Problem Relation Age of Onset  . Arthritis Mother   . Hypertension Mother   . Alcohol abuse Father   . Arthritis Father   . Hypertension Father   . Colon cancer Father   . Prostate cancer Father     Social History   Socioeconomic History  . Marital status: Married    Spouse name: Not on file  . Number of children: 1  . Years of education: Not on file  . Highest education level: Not on file  Occupational  History  . Not on file  Social Needs  . Financial resource strain: Not on file  . Food insecurity    Worry: Not on file    Inability: Not on file  . Transportation needs    Medical: Not on file    Non-medical: Not on file  Tobacco Use  . Smoking status: Never Smoker  . Smokeless tobacco: Never Used  Substance and Sexual Activity  . Alcohol use: Yes    Comment: rare beer  . Drug use: No  . Sexual activity: Not on file  Lifestyle  . Physical activity    Days per week: 0 days    Minutes per session: Not on file  . Stress: To some extent  Relationships  . Social Herbalist on phone: Not on file    Gets together: Not on file    Attends religious service: Not on file    Active member of club or organization: Not on file    Attends meetings of clubs or organizations: Not on file    Relationship status: Not on file  . Intimate partner violence    Fear of current or ex partner: Not on file    Emotionally abused: Not on file    Physically abused: Not on file    Forced sexual activity: Not on file  Other Topics Concern  . Not on file  Social History Narrative   Married, one son [name]).   Occupation: Market researcher, currently unemployed, working on appeal for medical disability as of 11/2011.  No tob or drugs.  Drinks alcohol socially.    Outpatient Medications Prior to Visit  Medication Sig Dispense Refill  . acetaminophen (TYLENOL) 325 MG tablet Take 1-2 tablets (325-650 mg total) by mouth every 4 (four) hours as needed for mild pain.    . clotrimazole-betamethasone (LOTRISONE) cream Apply to affected area bid prn 45 g 11  . FLUoxetine (PROZAC) 40 MG capsule Take 1 capsule (40 mg total) by mouth daily. 90 capsule 3  . levothyroxine (SYNTHROID) 150 MCG tablet TAKE 1 TABLET BY MOUTH ONCE DAILY EXCEPT FOR SUNDAY SKIP DOSE 30 tablet 2  . modafinil (PROVIGIL) 200 MG tablet Take 1 tablet (200 mg total) by mouth daily. 30 tablet 1  . omeprazole (PRILOSEC) 40 MG  capsule TAKE 1 CAPSULE BY MOUTH ONCE DAILY 90 capsule 2  . Simethicone 125 MG TABS Take 125 mg by mouth every 6 (six) hours as needed for flatulence.    . tamsulosin (FLOMAX) 0.4 MG CAPS capsule Take 1 capsule (0.4 mg total) by mouth daily. 30 capsule 1  . traZODone (DESYREL) 100 MG tablet TAKE 1 TABLET BY MOUTH AT BEDTIME AS NEEDED FOR SLEEP. 90 tablet 3  . lamoTRIgine (LAMICTAL) 200 MG tablet Take 1 tablet (200 mg total) by mouth daily. (Patient  not taking: Reported on 08/02/2019) 30 tablet 1  . ondansetron (ZOFRAN) 8 MG tablet Take 1 tablet (8 mg total) by mouth every 8 (eight) hours as needed for nausea or vomiting. (Patient not taking: Reported on 08/02/2019) 20 tablet 1   No facility-administered medications prior to visit.     No Known Allergies  ROS Review of Systems  Constitutional: Positive for fatigue (chronic). Negative for appetite change, chills and fever.  HENT: Negative for congestion, dental problem, ear pain and sore throat.   Eyes: Negative for discharge, redness and visual disturbance.  Respiratory: Negative for cough, chest tightness, shortness of breath and wheezing.   Cardiovascular: Negative for chest pain, palpitations and leg swelling.  Gastrointestinal: Negative for abdominal pain, blood in stool, diarrhea, nausea and vomiting.  Genitourinary: Negative for difficulty urinating, dysuria, flank pain, frequency, hematuria and urgency.  Musculoskeletal: Negative for arthralgias, back pain, joint swelling, myalgias and neck stiffness.  Skin: Negative for pallor and rash.  Neurological: Negative for dizziness, speech difficulty, weakness and headaches.  Hematological: Negative for adenopathy. Does not bruise/bleed easily.  Psychiatric/Behavioral: Positive for decreased concentration (chronic) and sleep disturbance. Negative for confusion. The patient is not nervous/anxious.     PE; Blood pressure 126/85, pulse 84, temperature 97.6 F (36.4 C), temperature source  Temporal, resp. rate 16, height 5\' 7"  (1.702 m), weight 210 lb (95.3 kg), SpO2 100 %. Body mass index is 32.89 kg/m.  Gen: Alert, tired-appearing.  Patient is oriented to person, place, time, and situation. AFFECT: speech slurred somewhat as per his normal but lucid.  Flat affect. ENT: Ears: EACs clear, normal epithelium.  TMs with good light reflex and landmarks bilaterally.  Eyes: no injection, icteris, swelling, or exudate.  EOMI, PERRLA. Nose: no drainage or turbinate edema/swelling.  No injection or focal lesion.  Mouth: lips without lesion/swelling.  Oral mucosa pink and moist.  Dentition intact and without obvious caries or gingival swelling.  Oropharynx without erythema, exudate, or swelling.  Neck: supple/nontender.  No LAD, mass, or TM.  Carotid pulses 2+ bilaterally, without bruits. CV: RRR, no m/r/g.   LUNGS: CTA bilat, nonlabored resps, good aeration in all lung fields. ABD: soft, NT, ND, BS normal.  No hepatospenomegaly or mass.  No bruits. EXT: no clubbing, cyanosis, or edema.  Musculoskeletal: no joint swelling, erythema, warmth, or tenderness.  ROM of all joints intact. Skin - no sores or suspicious lesions or rashes or color changes Strength 5-/5 in all muscle groups bilat. SKIN: Left scapular region with 2 cm area of erythema and superficial ulceration.  No subQ nodule or fluctuance.  Non-tender.  No streaking or warmth.  No vesicles or pustules or petechiae.  No hives.   Pertinent labs:  Lab Results  Component Value Date   TSH 2.335 10/31/2018   Lab Results  Component Value Date   WBC 4.4 11/16/2018   HGB 15.3 11/16/2018   HCT 46.1 11/16/2018   MCV 93.1 11/16/2018   PLT 192.0 11/16/2018   Lab Results  Component Value Date   CREATININE 0.91 11/16/2018   BUN 13 11/16/2018   NA 140 11/16/2018   K 5.3 (H) 11/16/2018   CL 101 11/16/2018   CO2 33 (H) 11/16/2018   Lab Results  Component Value Date   ALT 20 11/03/2018   AST 29 11/03/2018   ALKPHOS 78 11/03/2018    BILITOT 0.7 11/03/2018   Lab Results  Component Value Date   CHOL 233 (H) 07/17/2018   Lab Results  Component Value Date  HDL 72.00 07/17/2018   Lab Results  Component Value Date   LDLCALC 138 (H) 07/17/2018   Lab Results  Component Value Date   TRIG 117.0 07/17/2018   Lab Results  Component Value Date   CHOLHDL 3 07/17/2018   Lab Results  Component Value Date   PSA 0.38 07/17/2018   PSA 0.57 08/15/2012    ASSESSMENT AND PLAN:   1) Bullous skin lesion, resolving. Possibly due to lamictal, so we stopped this med.  2) BPH: not improved with 0.4mg  flomax so we'll increase to 0.8mg .  3) Uncontrolled daytime somnolence, chronic poor focus/adult add-->Failed trial of provigil. Change back to adderall xr 30 mg qd. I did electronic rx's for adderall xr 30mg , 1 qAM, #30 today for this month, Sept, and Oct 2020.  Appropriate fill on/after date was noted on each rx. Will arrange home sleep study.  4) Hypothyroidism: TSH.  5) Myotonic dystrophy: pt continues to decline.   He declined HH assistance at this time. Wife taking care of him for the most part. Restart robinul forte for excessive oral secretions.  6) Health maintenance exam: Reviewed age and gender appropriate health maintenance issues (prudent diet, regular exercise, health risks of tobacco and excessive alcohol, use of seatbelts, fire alarms in home, use of sunscreen).  Also reviewed age and gender appropriate health screening as well as vaccine recommendations. Vaccines: all UTD. Labs: CBC, CMET, TSH, calcitonin (hx of thyroid ca), PSA. Prostate ca screening: PSA.  Pt deferred DRE. Colon ca screening:  He's going to consider this but is leaning towards not doing it at this time.  An After Visit Summary was printed and given to the patient.  FOLLOW UP:  3 mo RCI  Signed:  Crissie Sickles, MD           08/02/2019

## 2019-08-06 ENCOUNTER — Telehealth: Payer: Self-pay

## 2019-08-06 LAB — CALCITONIN: Calcitonin: <2 pg/mL (ref <=10)

## 2019-08-06 NOTE — Telephone Encounter (Signed)
MyChart message sent with results.  

## 2019-08-08 ENCOUNTER — Other Ambulatory Visit: Payer: Self-pay | Admitting: Family Medicine

## 2019-08-08 NOTE — Telephone Encounter (Addendum)
Spoke with Coralyn Mark at [facility] to check status of referral.  Referral is still in workqueue which they complete in order of received. Coralyn Mark states currently next home sleep study appointment is in October. Will notify patient of update.

## 2019-08-09 NOTE — Telephone Encounter (Signed)
This should be good. Thank you!

## 2019-08-14 ENCOUNTER — Encounter: Payer: Self-pay | Admitting: Family Medicine

## 2019-08-14 NOTE — Telephone Encounter (Signed)
Pt sent the following my chart message: [name] has another new fluid filled blister. This is the second he has had since our visit. One popped up on his right lower abdomen last week. One popped up last night on his upper right arm. Would be weird to be contact given their distribution. Only other "new" is the Flomax which started same time as lamictal was increased. Do we need video visit, stop Flomax, continue topical care? Thanks as always for your help!  Please advise if pt needs appt, is VV okay if so?

## 2019-08-21 ENCOUNTER — Other Ambulatory Visit: Payer: Self-pay | Admitting: Family Medicine

## 2019-08-24 ENCOUNTER — Other Ambulatory Visit: Payer: Self-pay | Admitting: Family Medicine

## 2019-08-31 HISTORY — PX: OTHER SURGICAL HISTORY: SHX169

## 2019-09-03 ENCOUNTER — Ambulatory Visit (HOSPITAL_BASED_OUTPATIENT_CLINIC_OR_DEPARTMENT_OTHER): Payer: 59 | Attending: Family Medicine | Admitting: Internal Medicine

## 2019-09-03 ENCOUNTER — Other Ambulatory Visit: Payer: Self-pay

## 2019-09-03 DIAGNOSIS — G4733 Obstructive sleep apnea (adult) (pediatric): Secondary | ICD-10-CM | POA: Diagnosis not present

## 2019-09-06 DIAGNOSIS — G4733 Obstructive sleep apnea (adult) (pediatric): Secondary | ICD-10-CM | POA: Insufficient documentation

## 2019-09-07 DIAGNOSIS — G4733 Obstructive sleep apnea (adult) (pediatric): Secondary | ICD-10-CM

## 2019-09-07 NOTE — Procedures (Signed)
   Patient Name: [name], [name] Date: 09/04/2019 Gender: Male D.O.B: 1961/12/11 Age (years): 24 Referring Provider: [name] [name] Height (inches): 71 Interpreting Physician: Baird Lyons MD, ABSM Weight (lbs): 210 RPSGT: Jacolyn Reedy BMI: 33 MRN: [id number] Neck Size: 17.00  CLINICAL INFORMATION Sleep Study Type: HST Indication for sleep study: OSA Epworth Sleepiness Score: 17  SLEEP STUDY TECHNIQUE A multi-channel overnight portable sleep study was performed. The channels recorded were: nasal airflow, thoracic respiratory movement, and oxygen saturation with a pulse oximetry. Snoring was also monitored.  MEDICATIONS Patient self administered medications include: none reported.  SLEEP ARCHITECTURE Patient was studied for 293.5 minutes. The sleep efficiency was 80.1 % and the patient was supine for 0.1%. The arousal index was 0.0 per hour.  RESPIRATORY PARAMETERS The overall AHI was 102.0 per hour, with a central apnea index of 0.0 per hour. The oxygen nadir was 86% during sleep.  CARDIAC DATA Mean heart rate during sleep was 81.0 bpm.  IMPRESSIONS - Severe obstructive sleep apnea occurred during this study (AHI = 102.0/h). - No significant central sleep apnea occurred during this study (CAI = 0.0/h). - Moderate oxygen desaturation was noted during this study (Min O2 = 86%). Mean sat 92%. - Patient snored.  DIAGNOSIS - Obstructive Sleep Apnea (327.23 [G47.33 ICD-10])  RECOMMENDATIONS - Suggest CPAP titration sleep study or autopap. Other options would be based on clinical judgment. - Be careful with alcohol, sedatives and other CNS depressants that may worsen sleep apnea and disrupt normal sleep architecture. - Sleep hygiene should be reviewed to assess factors that may improve sleep quality. - Weight management and regular exercise should be initiated or continued.  [Electronically signed] 09/07/2019 10:25 AM  Baird Lyons MD, ABSM Diplomate,  American Board of Sleep Medicine   NPI: [id number]                          [place] [place] [facility]  ELECTRONICALLY SIGNED ON:  09/07/2019, 10:22 AM [place] PH: [phone]   FX: [phone] [place]

## 2019-09-10 ENCOUNTER — Encounter: Payer: Self-pay | Admitting: Family Medicine

## 2019-09-10 ENCOUNTER — Other Ambulatory Visit: Payer: Self-pay | Admitting: Family Medicine

## 2019-09-11 ENCOUNTER — Telehealth: Payer: Self-pay | Admitting: Family Medicine

## 2019-09-11 NOTE — Telephone Encounter (Signed)
Family wants Dr. Anitra Lauth to talk them about patient's recent sleep study  Please contact wife

## 2019-09-12 NOTE — Telephone Encounter (Signed)
Are you able to call pt's wife, [name] sometime this week to further discuss? Please advise, thanks.

## 2019-09-13 ENCOUNTER — Encounter: Payer: Self-pay | Admitting: Family Medicine

## 2019-09-13 ENCOUNTER — Other Ambulatory Visit: Payer: Self-pay | Admitting: Family Medicine

## 2019-09-13 NOTE — Telephone Encounter (Signed)
Pt sent mychart message with final decision regarding recent sleep study. PCP mentioned "Remind them that if they would RATHER a sleep specialist (pulmonologist) manage this problem then that is fine with me. Let me know".

## 2019-10-11 ENCOUNTER — Other Ambulatory Visit: Payer: Self-pay

## 2019-10-19 ENCOUNTER — Other Ambulatory Visit: Payer: Self-pay | Admitting: Family Medicine

## 2019-11-13 ENCOUNTER — Other Ambulatory Visit: Payer: Self-pay | Admitting: Family Medicine

## 2019-11-19 ENCOUNTER — Other Ambulatory Visit: Payer: Self-pay | Admitting: Family Medicine

## 2019-11-19 MED ORDER — AMPHETAMINE-DEXTROAMPHET ER 10 MG PO CP24: ORAL_CAPSULE | ORAL | 0 refills | Status: DC

## 2019-11-19 MED ORDER — AMPHETAMINE-DEXTROAMPHET ER 30 MG PO CP24: 30.0000 mg | ORAL_CAPSULE | Freq: Every day | ORAL | 0 refills | Status: DC

## 2019-11-19 NOTE — Telephone Encounter (Signed)
Requesting: adderall xr 30mg  Contract:11/30/18 UDS: n/a Last Visit:08/02/19 Next Visit: advised to f/u 3 mo. RCI Last Refill: electronic rx's sent for Aug, Sept, Oct  Please Advise. Medication pending

## 2019-11-27 ENCOUNTER — Telehealth: Payer: Self-pay

## 2019-11-27 NOTE — Telephone Encounter (Signed)
Signed notification that pt's levothyroxine will be dispensed by different manufacturer from this point on.

## 2019-11-27 NOTE — Telephone Encounter (Signed)
Form was faxed back.

## 2019-11-27 NOTE — Telephone Encounter (Signed)
Letter of notification faxed from pt's pharmacy regarding Levothyroxine 150mg . PCP will review and sign, if appropriate.

## 2019-12-09 ENCOUNTER — Other Ambulatory Visit: Payer: Self-pay | Admitting: Family Medicine

## 2019-12-14 ENCOUNTER — Other Ambulatory Visit: Payer: Self-pay | Admitting: Family Medicine

## 2020-01-01 ENCOUNTER — Telehealth: Payer: Self-pay | Admitting: Family Medicine

## 2020-01-01 NOTE — Telephone Encounter (Signed)
Please advise if this time is okay to schedule patient, thanks.

## 2020-01-01 NOTE — Telephone Encounter (Signed)
Virtual at 4:15 on Friday is OK with me. Pls verify with Dawn that a virtual visit is adequate in order to be able to rx a hospital bed for a pt.-thx

## 2020-01-01 NOTE — Telephone Encounter (Signed)
Wife called to schedule Virtual appt with Dr. Anitra Lauth.  There are no openings.  I found 1 available on Friday at 4:15PM. Patient wife that time works for her and patient IF okay with Dr. Anitra Lauth Follow up sleep study(?) appt, what is next plans ; and patient needs a hospital bed for home  Please let me know OR wife know what time we can do appt IF appt is needed  (4:15) [email or web address] // Follow up [facility],needs hospital bed // room pt by calling wife cell [phone]

## 2020-01-03 ENCOUNTER — Other Ambulatory Visit: Payer: Self-pay

## 2020-01-03 ENCOUNTER — Ambulatory Visit: Payer: 59 | Admitting: Family Medicine

## 2020-01-03 NOTE — Telephone Encounter (Signed)
Patient has been scheduled for virtual visit, originally scheduled for today 01/03/20. Rescheduled to Monday 1/11

## 2020-01-06 ENCOUNTER — Other Ambulatory Visit: Payer: Self-pay

## 2020-01-06 ENCOUNTER — Encounter: Payer: Self-pay | Admitting: Family Medicine

## 2020-01-06 ENCOUNTER — Ambulatory Visit (INDEPENDENT_AMBULATORY_CARE_PROVIDER_SITE_OTHER): Payer: 59 | Admitting: Family Medicine

## 2020-01-06 VITALS — BP 120/78 | HR 90

## 2020-01-06 DIAGNOSIS — G4733 Obstructive sleep apnea (adult) (pediatric): Secondary | ICD-10-CM

## 2020-01-06 DIAGNOSIS — K032 Erosion of teeth: Secondary | ICD-10-CM

## 2020-01-06 DIAGNOSIS — Z8719 Personal history of other diseases of the digestive system: Secondary | ICD-10-CM | POA: Diagnosis not present

## 2020-01-06 DIAGNOSIS — Z79899 Other long term (current) drug therapy: Secondary | ICD-10-CM

## 2020-01-06 DIAGNOSIS — R531 Weakness: Secondary | ICD-10-CM | POA: Diagnosis not present

## 2020-01-06 DIAGNOSIS — K029 Dental caries, unspecified: Secondary | ICD-10-CM | POA: Diagnosis not present

## 2020-01-06 DIAGNOSIS — F3341 Major depressive disorder, recurrent, in partial remission: Secondary | ICD-10-CM

## 2020-01-06 DIAGNOSIS — G7111 Myotonic muscular dystrophy: Secondary | ICD-10-CM

## 2020-01-06 DIAGNOSIS — E039 Hypothyroidism, unspecified: Secondary | ICD-10-CM | POA: Diagnosis not present

## 2020-01-06 DIAGNOSIS — F988 Other specified behavioral and emotional disorders with onset usually occurring in childhood and adolescence: Secondary | ICD-10-CM

## 2020-01-06 DIAGNOSIS — S025XXA Fracture of tooth (traumatic), initial encounter for closed fracture: Secondary | ICD-10-CM | POA: Diagnosis not present

## 2020-01-06 DIAGNOSIS — Z7401 Bed confinement status: Secondary | ICD-10-CM

## 2020-01-06 MED ORDER — AMPHETAMINE-DEXTROAMPHET ER 10 MG PO CP24: ORAL_CAPSULE | ORAL | 0 refills | Status: DC

## 2020-01-06 MED ORDER — AMPHETAMINE-DEXTROAMPHET ER 30 MG PO CP24: 30.0000 mg | ORAL_CAPSULE | Freq: Every day | ORAL | 0 refills | Status: DC

## 2020-01-06 NOTE — Progress Notes (Signed)
Virtual Visit via Video Note  I connected with pt on 01/06/20 at 10:30 AM EST by a video enabled telemedicine application and verified that I am speaking with the correct person using two identifiers.  Location patient: home Location provider:work or home office Persons participating in the virtual visit: patient, pt's wife [name], provider.  I discussed the limitations of evaluation and management by telemedicine and the availability of in person appointments. The patient expressed understanding and agreed to proceed.  Telemedicine visit is a necessity given the COVID-19 restrictions in place at the current time.  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 F/u recent sleep study that documented severe OSA.  I ordered the home sleep study in order to try to minimize pt's need to travel to specialist MD visits b/c of his severe physical decline and difficulty getting to appt's ,etc.  Discussed results of sleep study today.  I told them that with his pulmonary issues in the setting of his myotonic dystrophy progression I told him I don't feel comfortable being the one to manage this.  They understand and agree to pulm referral for this.  Pt/family requests order for hospital bed to be obtained via [facility] agency. Patient requires repositioning of the body in ways that cannot be achieved with an ordinary bed or wedge pillow to eliminate pain, reduce pressure, or treat existence of pressure sores.  Patient requires head of the bed to be elevated at least 30 deg for most of the time.  Patient requires frequent changes in body positions which cannot be achieved with a normal bed. Pt has never agreed to do any HH PT. He refuses to use the bedside commode. He does require 24/7 care, wife working from home but often this is interrupted for 1-2 days at a time at least 3 times per month for extra care/MD visits/etc.  She requests that I fill out FMLA for  her regarding this situation and I said I would.  Pt's teeth are in extensive disrepair/some fractured, impairing eating/chewing is not adequate, may need extractions. He has not seen a dentist in "forever" and since he has complex medical problems we all think oral surgeon would be more appropriate MD to evaluate him for this problem.  Meds seem optimally managed at this time, regarding his adderall and fluoxetine. Energy level and focus stable but still pretty poor, mood consistently pretty flat/down but overall unchanged/stable. Wife and pt seem to think no changes are needed in the mgmt of these things at this time.   ROS: See pertinent positives and negatives per HPI.  Past Medical History:  Diagnosis Date  . Adult ADHD   . Aspiration pneumonitis ([place]) 04/2017, 08/2017   Recurrent  . Chronic respiratory failure ([place])    As of 04/2018, Dr. Chase Caller suspects hypercapnic RF--but [name] normal.  May eventually need nocturnal noninvasive ventilation.  Ultimately, if progresses enough pt will need trach.  . Constipation, chronic    Has caused irritative urinary/bladder symptoms in the past.  . Depression   . Dizziness 06/2011+   Noncontrast CT and noncontrast MRI both negative 06/2011 at [facility] ED visit.  . Elevated transaminase level    Liver biopsy revealed fatty liver  . Erectile dysfunction   . Facial fracture ([place]) 06/2011   Nondisplaced bilateral LeFort I fracture: no surgery required. (ENT, Dr. Wilburn Cornelia)   . Fatty liver disease, nonalcoholic   . GERD (gastroesophageal reflux disease)   .  History of thyroid cancer 2010   Medullary carcinoma of thyroid. Thyroidectomy 08/2009 (Dr. Aida Puffer at [facility]).  Needs periodic serum calcitonin monitoring.  Marland Kitchen Hyperlipidemia   . Hypothyroidism   . LVH (left ventricular hypertrophy)    by EKG criteria 06/2011.  Hx of heart murmur--Followed by cardiology at [facility]: EF's have been "stable" per cardio office note 12/29/10.  His echo 12/2012 showed normal  LV size and wall thickness but EF 45-50% and global LV hypokinesis was found.  . Myotonic dystrophy ([place]) Dx'd 1996   Progressive muscle weakness and swallowing dysfunction.  Initial dx by Dr. Jannifer Franklin, neurologist, but subsequent annual specialist f/u has been with Dr. Ladon Applebaum [facility].  Progressive debilitation--PT referral 04/2017, pt quit PT 05/2017.  . Nephrolithiasis   . Obesity, Class I, BMI 30-34.9   . OSA (obstructive sleep apnea)    SEVERE 08/2019 sleep study)  . Pulmonary nodule, right 05/2017   RUL, 7 mm sub solid nodule.  Resolution noted on f/u CT 08/2017.    Past Surgical History:  Procedure Laterality Date  . COLONOSCOPY  09/13/2007   normal (Dr. Deatra Ina).  . EXTRACORPOREAL SHOCK WAVE LITHOTRIPSY    . Home sleep study  08/31/2019   Severe OSA  . THYROIDECTOMY  08/2009   for medullary thyroid cancer (Dr. Conley Canal at [facility] follows him for this).  . TRANSTHORACIC ECHOCARDIOGRAM  12/2012; 09/2017   2014-Normal LV size and wall thickness, LV EF 45-50%, with global hypokinesis of LV.  2018- EF 50-55%, normal wall motion, grd I DD, valves normal.    Family History  Problem Relation Age of Onset  . Arthritis Mother   . Hypertension Mother   . Alcohol abuse Father   . Arthritis Father   . Hypertension Father   . Colon cancer Father   . Prostate cancer Father     SOCIAL HX:  Social History   Socioeconomic History  . Marital status: Married    Spouse name: Not on file  . Number of children: 1  . Years of education: Not on file  . Highest education level: Not on file  Occupational History  . Not on file  Tobacco Use  . Smoking status: Never Smoker  . Smokeless tobacco: Never Used  Substance and Sexual Activity  . Alcohol use: Yes    Comment: rare beer  . Drug use: No  . Sexual activity: Not on file  Other Topics Concern  . Not on file  Social History Narrative   Married, one son [name]).   Occupation: Market researcher, currently unemployed, working on  appeal for medical disability as of 11/2011.  No tob or drugs.  Drinks alcohol socially.   Social Determinants of Health   Financial Resource Strain:   . Difficulty of Paying Living Expenses: Not on file  Food Insecurity:   . Worried About Charity fundraiser in the Last Year: Not on file  . Ran Out of Food in the Last Year: Not on file  Transportation Needs:   . Lack of Transportation (Medical): Not on file  . Lack of Transportation (Non-Medical): Not on file  Physical Activity:   . Days of Exercise per Week: Not on file  . Minutes of Exercise per Session: Not on file  Stress:   . Feeling of Stress : Not on file  Social Connections:   . Frequency of Communication with Friends and Family: Not on file  . Frequency of Social Gatherings with Friends and Family: Not on file  .  Attends Religious Services: Not on file  . Active Member of Clubs or Organizations: Not on file  . Attends Archivist Meetings: Not on file  . Marital Status: Not on file      Current Outpatient Medications:  .  acetaminophen (TYLENOL) 325 MG tablet, Take 1-2 tablets (325-650 mg total) by mouth every 4 (four) hours as needed for mild pain., Disp: , Rfl:  .  amphetamine-dextroamphetamine (ADDERALL XR) 10 MG 24 hr capsule, TAKE 1 CAPSULE BY MOUTH EVERY MORNING (TO BE TAKEN WITH 30MG  XR CAPSULES), Disp: 30 capsule, Rfl: 0 .  amphetamine-dextroamphetamine (ADDERALL XR) 30 MG 24 hr capsule, Take 1 capsule (30 mg total) by mouth daily., Disp: 30 capsule, Rfl: 0 .  clotrimazole-betamethasone (LOTRISONE) cream, Apply to affected area bid prn, Disp: 45 g, Rfl: 11 .  FLUoxetine (PROZAC) 40 MG capsule, TAKE 1 CAPSULE (40 MG TOTAL) BY MOUTH DAILY., Disp: 90 capsule, Rfl: 0 .  glycopyrrolate (ROBINUL) 2 MG tablet, 1/2-1 tab po bid prn excessive oral secretions, Disp: 60 tablet, Rfl: 6 .  levothyroxine (SYNTHROID) 150 MCG tablet, TAKE 1 TABLET BY MOUTH ONCE DAILY EXCEPT FOR SUNDAY SKIP DOSE, Disp: 30 tablet, Rfl:  2 .  omeprazole (PRILOSEC) 40 MG capsule, TAKE 1 CAPSULE BY MOUTH ONCE DAILY, Disp: 90 capsule, Rfl: 2 .  Simethicone 125 MG TABS, Take 125 mg by mouth every 6 (six) hours as needed for flatulence., Disp: , Rfl:  .  traZODone (DESYREL) 100 MG tablet, TAKE 1 TABLET BY MOUTH AT BEDTIME AS NEEDED FOR SLEEP., Disp: 90 tablet, Rfl: 3 .  ondansetron (ZOFRAN) 8 MG tablet, Take 1 tablet (8 mg total) by mouth every 8 (eight) hours as needed for nausea or vomiting. (Patient not taking: Reported on 01/06/2020), Disp: 20 tablet, Rfl: 1  EXAM:  VITALS per patient if applicable: BP 99991111 (BP Location: Left Arm, Patient Position: Sitting, Cuff Size: Large)   Pulse 90   SpO2 99%    GENERAL: alert, oriented, appears well and in no acute distress  HEENT: atraumatic, conjunttiva clear, no obvious abnormalities on inspection of external nose and ears  NECK: normal movements of the head and neck  LUNGS: on inspection no signs of respiratory distress, breathing rate appears normal, no obvious gross SOB, gasping or wheezing  CV: no obvious cyanosis   LABS: none today  Lab Results  Component Value Date   TSH 6.39 (H) 08/02/2019   Lab Results  Component Value Date   WBC 6.1 08/02/2019   HGB 14.0 08/02/2019   HCT 43.3 08/02/2019   MCV 93.8 08/02/2019   PLT 232.0 08/02/2019   Lab Results  Component Value Date   CREATININE 0.80 08/02/2019   BUN 14 08/02/2019   NA 137 08/02/2019   K 4.2 08/02/2019   CL 96 08/02/2019   CO2 29 08/02/2019   Lab Results  Component Value Date   ALT 15 08/02/2019   AST 20 08/02/2019   ALKPHOS 84 08/02/2019   BILITOT 0.4 08/02/2019   Lab Results  Component Value Date   CHOL 233 (H) 07/17/2018   Lab Results  Component Value Date   HDL 72.00 07/17/2018   Lab Results  Component Value Date   LDLCALC 138 (H) 07/17/2018   Lab Results  Component Value Date   TRIG 117.0 07/17/2018   Lab Results  Component Value Date   CHOLHDL 3 07/17/2018   Lab Results   Component Value Date   PSA 0.35 08/02/2019   PSA 0.38 07/17/2018  PSA 0.57 08/15/2012    ASSESSMENT AND PLAN:  Discussed the following assessment and plan:  1) Myotonic dystrophy: progressive more rapidly the last 1 yr. He does need a hosp bed: see HPI for details.  Will order DME. He declines PT/OT.  2) Severe OSA: complex mgmt expected, esp given Dr. Golden Pop suspicion that he has chronic hypercapnic RF and may eventually need nocturnal noninvasive ventilation + even trach in future-->refer to pulm sleep MD.  3) Adult ADD/chronic fatigue: adderall efficacy adequate/stable. I did electronic rx's for adderall xr 30mg  + adderall xr 10mg  to be given together qAM, #30 of each today for each of the next 3 mo.  Appropriate fill on/after date was noted on each rx.  4) Hypothyroidism: TSH 3 mo ago a tiny bit up but I did not change his T4 dose. Ok to repeat labs 3-6 mo.  5) Chronic depression: stable, partial remission. No changes.  6) Severe teeth decay/deterioration, chronic.  This is affecting his ability to chew/eat. Will refer to oral surgeon.  -we discussed possible serious and likely etiologies, options for evaluation and workup, limitations of telemedicine visit vs in person visit, treatment, treatment risks and precautions. Pt prefers to treat via telemedicine empirically rather then risking or undertaking an in person visit at this moment. Patient agrees to seek prompt in person care if worsening, new symptoms arise, or if is not improving with treatment.   I discussed the assessment and treatment plan with the patient. The patient was provided an opportunity to ask questions and all were answered. The patient agreed with the plan and demonstrated an understanding of the instructions.   The patient was advised to call back or seek an in-person evaluation if the symptoms worsen or if the condition fails to improve as anticipated.  F/u: 1 mo telemed f/u OSA, oral surg, HH  needs  Signed:  Crissie Sickles, MD           01/06/2020

## 2020-01-08 ENCOUNTER — Telehealth: Payer: Self-pay

## 2020-01-08 NOTE — Telephone Encounter (Signed)
Left message to find out which provider patient would like oral surgery referral to be sent to. If patient has dental plan please check to see which provider is in network for their plan.

## 2020-01-16 ENCOUNTER — Encounter: Payer: Self-pay | Admitting: Family Medicine

## 2020-02-03 ENCOUNTER — Telehealth: Payer: Self-pay

## 2020-02-03 ENCOUNTER — Other Ambulatory Visit: Payer: Self-pay

## 2020-02-03 DIAGNOSIS — Z7401 Bed confinement status: Secondary | ICD-10-CM

## 2020-02-03 DIAGNOSIS — G7111 Myotonic muscular dystrophy: Secondary | ICD-10-CM

## 2020-02-03 DIAGNOSIS — Z7409 Other reduced mobility: Secondary | ICD-10-CM

## 2020-02-03 NOTE — Telephone Encounter (Signed)
Patient just had visit on 01/06/20 and advised to f/u 4 weeks. Pt requesting Rx for vertical lift/stair lift.  Please advise, thanks.

## 2020-02-03 NOTE — Telephone Encounter (Signed)
Pls do rx for stair lift. Dx's are mobility impairment, myotonic dystrophy, bed-bound/bedridden.

## 2020-02-03 NOTE — Telephone Encounter (Signed)
Patient wife called requesting prescription written for Stair lift to assist in saving some money on their taxes.  Please fax to [facility] Fax #  [phone]  For questions please call [name], wife of patient, at [phone].

## 2020-02-03 NOTE — Telephone Encounter (Signed)
Order entered, printed, signed and faxed to [facility] supply. Pt's wife, [name] notified.

## 2020-02-05 ENCOUNTER — Ambulatory Visit: Payer: 59 | Admitting: Family Medicine

## 2020-02-12 ENCOUNTER — Encounter: Payer: Self-pay | Admitting: Family Medicine

## 2020-02-12 ENCOUNTER — Other Ambulatory Visit: Payer: Self-pay

## 2020-02-12 ENCOUNTER — Ambulatory Visit (INDEPENDENT_AMBULATORY_CARE_PROVIDER_SITE_OTHER): Payer: 59 | Admitting: Family Medicine

## 2020-02-12 VITALS — BP 123/76 | HR 85

## 2020-02-12 DIAGNOSIS — K089 Disorder of teeth and supporting structures, unspecified: Secondary | ICD-10-CM

## 2020-02-12 DIAGNOSIS — F988 Other specified behavioral and emotional disorders with onset usually occurring in childhood and adolescence: Secondary | ICD-10-CM | POA: Diagnosis not present

## 2020-02-12 DIAGNOSIS — G4733 Obstructive sleep apnea (adult) (pediatric): Secondary | ICD-10-CM | POA: Diagnosis not present

## 2020-02-12 DIAGNOSIS — G7111 Myotonic muscular dystrophy: Secondary | ICD-10-CM

## 2020-02-12 DIAGNOSIS — G4700 Insomnia, unspecified: Secondary | ICD-10-CM

## 2020-02-12 DIAGNOSIS — G4719 Other hypersomnia: Secondary | ICD-10-CM | POA: Diagnosis not present

## 2020-02-12 MED ORDER — AMPHETAMINE-DEXTROAMPHETAMINE 20 MG PO TABS: ORAL_TABLET | ORAL | 0 refills | Status: DC

## 2020-02-12 NOTE — Progress Notes (Signed)
Virtual Visit via Video Note  I connected with pt on 02/12/20 at  2:30 PM EST by a video enabled telemedicine application and verified that I am speaking with the correct person using two identifiers.  Location patient: home Location provider:work or home office Persons participating in the virtual visit: patient, patient's wife [name], provider  I discussed the limitations of evaluation and management by telemedicine and the availability of in person appointments. The patient expressed understanding and agreed to proceed.  Telemedicine visit is a necessity given the COVID-19 restrictions in place at the current time.  HPI: 59 y/o WM being seen today accompanied by his wife [name] for 5 week f/u multiple chronic medical issues. A/P as of last visit: "1) Myotonic dystrophy: progressive more rapidly the last 1 yr. He does need a hosp bed: see HPI for details.  Will order DME. He declines PT/OT.  2) Severe OSA: complex mgmt expected, esp given Dr. Golden Pop suspicion that he has chronic hypercapnic RF and may eventually need nocturnal noninvasive ventilation + even trach in future-->refer to pulm sleep MD.  3) Adult ADD/chronic fatigue: adderall efficacy adequate/stable. I did electronic rx's for adderall xr 30mg  + adderall xr 10mg  to be given together qAM, #30 of each today for each of the next 3 mo.  Appropriate fill on/after date was noted on each rx.  4) Hypothyroidism: TSH 3 mo ago a tiny bit up but I did not change his T4 dose. Ok to repeat labs 3-6 mo.  5) Chronic depression: stable, partial remission. No changes.  6) Severe teeth decay/deterioration, chronic.  This is affecting his ability to chew/eat. Will refer to oral surgeon."  Interim hx: No new c/o.  Status of recent referrals: Pulm: wife has not contacted yet, says she has been trying to focus on getting dentition probs addressed first. Oral surgery: has plan to see one but pt seems to be balking at the idea of  this. He is managing to eat food that is cut up small, salads, etc. HH/DME: he got stair lift but no hosp bed yet.   Plans on getting this still, seems to be a supply issue currently.  Not sleeping well, wakes up q1hr, feels uncomfortable b/c of positioning. Taking trazodone 100 mg and robinul hs but not seeming to help him sleep any.  Alertness, motivation still signif impaired.  Pt feels like he did better with an afternoon dose of adderall in the past.    ROS: See pertinent positives and negatives per HPI.  Past Medical History:  Diagnosis Date  . Adult ADHD   . Aspiration pneumonitis ([place]) 04/2017, 08/2017   Recurrent  . Chronic respiratory failure ([place])    As of 04/2018, Dr. Chase Caller suspects hypercapnic RF--but [name] normal.  May eventually need nocturnal noninvasive ventilation.  Ultimately, if progresses enough pt will need trach.  . Constipation, chronic    Has caused irritative urinary/bladder symptoms in the past.  . Depression   . Dizziness 06/2011+   Noncontrast CT and noncontrast MRI both negative 06/2011 at [facility] ED visit.  . Elevated transaminase level    Liver biopsy revealed fatty liver  . Erectile dysfunction   . Facial fracture ([place]) 06/2011   Nondisplaced bilateral LeFort I fracture: no surgery required. (ENT, Dr. Wilburn Cornelia)   . Fatty liver disease, nonalcoholic   . GERD (gastroesophageal reflux disease)   . History of thyroid cancer 2010   Medullary carcinoma of thyroid. Thyroidectomy 08/2009 (Dr. Aida Puffer at [facility]).  Needs periodic serum  calcitonin monitoring.  Marland Kitchen Hyperlipidemia   . Hypothyroidism   . LVH (left ventricular hypertrophy)    by EKG criteria 06/2011.  Hx of heart murmur--Followed by cardiology at [facility]: EF's have been "stable" per cardio office note 12/29/10.  His echo 12/2012 showed normal LV size and wall thickness but EF 45-50% and global LV hypokinesis was found.  . Myotonic dystrophy ([place]) Dx'd 1996   Progressive muscle weakness and swallowing  dysfunction.  Initial dx by Dr. Jannifer Franklin, neurologist, but subsequent annual specialist f/u has been with Dr. Ladon Applebaum [facility].  Progressive debilitation--PT referral 04/2017, pt quit PT 05/2017.  . Nephrolithiasis   . Obesity, Class I, BMI 30-34.9   . OSA (obstructive sleep apnea)    SEVERE 08/2019 sleep study)  . Pulmonary nodule, right 05/2017   RUL, 7 mm sub solid nodule.  Resolution noted on f/u CT 08/2017.    Past Surgical History:  Procedure Laterality Date  . COLONOSCOPY  09/13/2007   normal (Dr. Deatra Ina).  . EXTRACORPOREAL SHOCK WAVE LITHOTRIPSY    . Home sleep study  08/31/2019   Severe OSA  . THYROIDECTOMY  08/2009   for medullary thyroid cancer ([facility] Conley Canal at [facility] follows him for this).  . TRANSTHORACIC ECHOCARDIOGRAM  12/2012; 09/2017   2014-Normal LV size and wall thickness, LV EF 45-50%, with global hypokinesis of LV.  2018- EF 50-55%, normal wall motion, grd I DD, valves normal.    Family History  Problem Relation Age of Onset  . Arthritis Mother   . Hypertension Mother   . Alcohol abuse Father   . Arthritis Father   . Hypertension Father   . Colon cancer Father   . Prostate cancer Father      Current Outpatient Medications:  .  acetaminophen (TYLENOL) 325 MG tablet, Take 1-2 tablets (325-650 mg total) by mouth every 4 (four) hours as needed for mild pain., Disp: , Rfl:  .  amphetamine-dextroamphetamine (ADDERALL XR) 10 MG 24 hr capsule, TAKE 1 CAPSULE BY MOUTH EVERY MORNING (TO BE TAKEN WITH 30MG  XR CAPSULES), Disp: 30 capsule, Rfl: 0 .  amphetamine-dextroamphetamine (ADDERALL XR) 30 MG 24 hr capsule, Take 1 capsule (30 mg total) by mouth daily., Disp: 30 capsule, Rfl: 0 .  clotrimazole-betamethasone (LOTRISONE) cream, Apply to affected area bid prn, Disp: 45 g, Rfl: 11 .  FLUoxetine (PROZAC) 40 MG capsule, TAKE 1 CAPSULE (40 MG TOTAL) BY MOUTH DAILY., Disp: 90 capsule, Rfl: 0 .  glycopyrrolate (ROBINUL) 2 MG tablet, 1/2-1 tab po bid prn excessive oral secretions,  Disp: 60 tablet, Rfl: 6 .  levothyroxine (SYNTHROID) 150 MCG tablet, TAKE 1 TABLET BY MOUTH ONCE DAILY EXCEPT FOR SUNDAY SKIP DOSE, Disp: 30 tablet, Rfl: 2 .  omeprazole (PRILOSEC) 40 MG capsule, TAKE 1 CAPSULE BY MOUTH ONCE DAILY, Disp: 90 capsule, Rfl: 2 .  Simethicone 125 MG TABS, Take 125 mg by mouth every 6 (six) hours as needed for flatulence., Disp: , Rfl:  .  traZODone (DESYREL) 100 MG tablet, TAKE 1 TABLET BY MOUTH AT BEDTIME AS NEEDED FOR SLEEP., Disp: 90 tablet, Rfl: 3 .  ondansetron (ZOFRAN) 8 MG tablet, Take 1 tablet (8 mg total) by mouth every 8 (eight) hours as needed for nausea or vomiting. (Patient not taking: Reported on 01/06/2020), Disp: 20 tablet, Rfl: 1  EXAM:  VITALS per patient if applicable:  BP XX123456 (BP Location: Left Arm, Patient Position: Sitting, Cuff Size: Large)   Pulse 85   SpO2 91%    GENERAL: alert, oriented,  appears well and in no acute distress  HEENT: atraumatic, conjunttiva clear, no obvious abnormalities on inspection of external nose and ears  NECK: normal movements of the head and neck  LUNGS: on inspection no signs of respiratory distress, breathing rate appears normal, no obvious gross SOB, gasping or wheezing  CV: no obvious cyanosis  MS: moves all visible extremities without noticeable abnormality  PSYCH/NEURO: pleasant and cooperative, no obvious depression or anxiety, speech and thought processing grossly intact  LABS: none today  Lab Results  Component Value Date   TSH 6.39 (H) 08/02/2019     Chemistry      Component Value Date/Time   NA 137 08/02/2019 1335   K 4.2 08/02/2019 1335   CL 96 08/02/2019 1335   CO2 29 08/02/2019 1335   BUN 14 08/02/2019 1335   CREATININE 0.80 08/02/2019 1335   CREATININE 0.95 02/20/2013 1604      Component Value Date/Time   CALCIUM 9.8 08/02/2019 1335   ALKPHOS 84 08/02/2019 1335   AST 20 08/02/2019 1335   ALT 15 08/02/2019 1335   BILITOT 0.4 08/02/2019 1335     Lab Results  Component  Value Date   WBC 6.1 08/02/2019   HGB 14.0 08/02/2019   HCT 43.3 08/02/2019   MCV 93.8 08/02/2019   PLT 232.0 08/02/2019   Lab Results  Component Value Date   CHOL 233 (H) 07/17/2018   HDL 72.00 07/17/2018   LDLCALC 138 (H) 07/17/2018   LDLDIRECT 147.1 08/15/2012   TRIG 117.0 07/17/2018   CHOLHDL 3 07/17/2018   Lab Results  Component Value Date   PSA 0.35 08/02/2019   PSA 0.38 07/17/2018   PSA 0.57 08/15/2012   ASSESSMENT AND PLAN:  Discussed the following assessment and plan:  1) Insomnia: discomfort from poor body positioning/weakness contributing some. Hopefully he'll be able to get hospital bed for home soon. Increase trazodone to TWO of the 100mg  tabs qhs.  If not helpful or if adverse side effects at this dose then will decrease back to 100mg  dose and add clonidine. Avoid benzo's due to his OSA/myotonic dystrophy.  2) Poor dentition: needs to continue pursuing eval/treatment through oral surgeon.  3) OSA: pt/wife to pursue pulm appt to get re-established.  Sleep study has been done and showed severe OSA.    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 No changes today.  5) Hypothyroidism: plan recheck TSH around next f/u in 2 mo.  6) Excessive daytime somnolence/ADD/impaired motivation: continue adderall xr 40mg  qAM and will add 20mg  "plain" adderall q2PM.  Hopefully some of this will improve if he gets his OSA treated.  I discussed the assessment and treatment plan with the patient. The patient was provided an opportunity to ask questions and all were answered. The patient agreed with the plan and demonstrated an understanding of the instructions.   The patient was advised to call back or seek an in-person evaluation if the symptoms worsen or if the condition fails to improve as anticipated.  F/u: 2 mo  Signed:  Crissie Sickles, MD           02/12/2020

## 2020-02-22 ENCOUNTER — Other Ambulatory Visit: Payer: Self-pay | Admitting: Family Medicine

## 2020-03-04 ENCOUNTER — Other Ambulatory Visit: Payer: Self-pay | Admitting: Family Medicine

## 2020-03-04 DIAGNOSIS — H524 Presbyopia: Secondary | ICD-10-CM | POA: Diagnosis not present

## 2020-03-06 ENCOUNTER — Other Ambulatory Visit: Payer: Self-pay | Admitting: Family Medicine

## 2020-03-11 ENCOUNTER — Other Ambulatory Visit: Payer: Self-pay | Admitting: Family Medicine

## 2020-03-11 NOTE — Telephone Encounter (Signed)
Requesting: adderall Contract:11/30/18 UDS:n/a Last Visit:02/12/20 Next Visit:04/15/20 Last Refill:02/12/20(30,0)  Please Advise. Medication pending

## 2020-03-23 ENCOUNTER — Ambulatory Visit: Payer: 59 | Attending: Internal Medicine

## 2020-03-23 DIAGNOSIS — Z23 Encounter for immunization: Secondary | ICD-10-CM

## 2020-03-23 NOTE — Progress Notes (Signed)
   Covid-19 Vaccination Clinic  Name:  [name]    MRN: [id number] DOB: 09/05/1961  03/23/2020  [name] was observed post Covid-19 immunization for 15 minutes without incident. He was provided with Vaccine Information Sheet and instruction to access the V-Safe system.   [name] was instructed to call 911 with any severe reactions post vaccine: Marland Kitchen Difficulty breathing  . Swelling of face and throat  . A fast heartbeat  . A bad rash all over body  . Dizziness and weakness   Immunizations Administered    Name Date Dose VIS Date Route   Pfizer COVID-19 Vaccine 03/23/2020  3:38 PM 0.3 mL 12/06/2019 Intramuscular   Manufacturer: [place]   Lot: [id number]   [place]: [id number]

## 2020-04-08 ENCOUNTER — Other Ambulatory Visit: Payer: Self-pay | Admitting: Family Medicine

## 2020-04-08 NOTE — Telephone Encounter (Signed)
Refill sent.

## 2020-04-08 NOTE — Telephone Encounter (Signed)
Requesting: adderall Contract:11/30/18 UDS: n/a Last Visit:02/12/20 Next Visit: 04/13/20 Last Refill:03/11/20(30,0)  Please Advise. Medication pending

## 2020-04-13 ENCOUNTER — Telehealth (INDEPENDENT_AMBULATORY_CARE_PROVIDER_SITE_OTHER): Payer: 59 | Admitting: Family Medicine

## 2020-04-13 ENCOUNTER — Telehealth: Payer: Self-pay

## 2020-04-13 ENCOUNTER — Encounter: Payer: Self-pay | Admitting: Family Medicine

## 2020-04-13 ENCOUNTER — Other Ambulatory Visit: Payer: Self-pay

## 2020-04-13 VITALS — BP 123/72 | HR 86

## 2020-04-13 DIAGNOSIS — E039 Hypothyroidism, unspecified: Secondary | ICD-10-CM

## 2020-04-13 DIAGNOSIS — Z7409 Other reduced mobility: Secondary | ICD-10-CM

## 2020-04-13 DIAGNOSIS — G7111 Myotonic muscular dystrophy: Secondary | ICD-10-CM

## 2020-04-13 DIAGNOSIS — R5381 Other malaise: Secondary | ICD-10-CM

## 2020-04-13 NOTE — Progress Notes (Signed)
Virtual Visit via Video Note  I connected with pt on 04/13/20 at  3:00 PM EDT by a video enabled telemedicine application and verified that I am speaking with the correct person using two identifiers.  Location patient: home Location provider:work or home office Persons participating in the virtual visit: patient, pt's wife, provider  I discussed the limitations of evaluation and management by telemedicine and the availability of in person appointments. The patient expressed understanding and agreed to proceed.  Telemedicine visit is a necessity given the COVID-19 restrictions in place at the current time.  HPI: 59 y/o WM being seen today for 2 mo f/u multiple chronic medical issues. A/P as of last visit: "1) Insomnia: discomfort from poor body positioning/weakness contributing some. Hopefully he'll be able to get hospital bed for home soon. Increase trazodone to TWO of the 100mg  tabs qhs.  If not helpful or if adverse side effects at this dose then will decrease back to 100mg  dose and add clonidine. Avoid benzo's due to his OSA/myotonic dystrophy.  2) Poor dentition: needs to continue pursuing eval/treatment through oral surgeon.  3) OSA: pt/wife to pursue pulm appt to get re-established.  Sleep study has been done and showed severe OSA.    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 No changes today.  5) Hypothyroidism: plan recheck TSH around next f/u in 2 mo.  6) Excessive daytime somnolence/ADD/impaired motivation: continue adderall xr 40mg  qAM and will add 20mg  "plain" adderall q2PM.  Hopefully some of this will improve if he gets his OSA treated."  Interim hx: "About the same".  Wife assists with history. Has appt in 3 d with Dr. Annamaria Boots to f/u OSA. Trazodone 150mg  dose helping a little better with insomnia.  200 mg dose was too much. Not up early enough in the morning to get AM adderall dose early enough to do an  afternoon boost. No further steps taken regarding his poor dentition. He did get eye glasses updated and this has been good.  Mobility: takes 6-8 steps with walker, otherwise immobile. Working on getting a WC ramp for his home. He is s/p covid vaccine #1.  He got a transfer chair for home/house transfers but does need a manual WC for when he needs to move further in home and also to get out of his house some.   ROS: See pertinent positives and negatives per HPI.  Past Medical History:  Diagnosis Date  . Adult ADHD   . Aspiration pneumonitis ([place]) 04/2017, 08/2017   Recurrent  . Chronic respiratory failure ([place])    As of 04/2018, Dr. Chase Caller suspects hypercapnic RF--but [name] normal.  May eventually need nocturnal noninvasive ventilation.  Ultimately, if progresses enough pt will need trach.  . Constipation, chronic    Has caused irritative urinary/bladder symptoms in the past.  . Depression   . Dizziness 06/2011+   Noncontrast CT and noncontrast MRI both negative 06/2011 at [facility] ED visit.  . Elevated transaminase level    Liver biopsy revealed fatty liver  . Erectile dysfunction   . Facial fracture ([place]) 06/2011   Nondisplaced bilateral LeFort I fracture: no surgery required. (ENT, Dr. Wilburn Cornelia)   . Fatty liver disease, nonalcoholic   . GERD (gastroesophageal reflux disease)   . History of thyroid cancer 2010   Medullary carcinoma of thyroid. Thyroidectomy 08/2009 (Dr. Aida Puffer at [facility]).  Needs periodic serum calcitonin monitoring.  Marland Kitchen Hyperlipidemia   . Hypothyroidism   . LVH (left ventricular hypertrophy)  by EKG criteria 06/2011.  Hx of heart murmur--Followed by cardiology at [facility]: EF's have been "stable" per cardio office note 12/29/10.  His echo 12/2012 showed normal LV size and wall thickness but EF 45-50% and global LV hypokinesis was found.  . Myotonic dystrophy ([place]) Dx'd 1996   Progressive muscle weakness and swallowing dysfunction.  Initial dx by Dr. Jannifer Franklin,  neurologist, but subsequent annual specialist f/u has been with Dr. Ladon Applebaum [facility].  Progressive debilitation--PT referral 04/2017, pt quit PT 05/2017.  . Nephrolithiasis   . Obesity, Class I, BMI 30-34.9   . OSA (obstructive sleep apnea)    SEVERE 08/2019 sleep study)  . Pulmonary nodule, right 05/2017   RUL, 7 mm sub solid nodule.  Resolution noted on f/u CT 08/2017.    Past Surgical History:  Procedure Laterality Date  . COLONOSCOPY  09/13/2007   normal (Dr. Deatra Ina).  . EXTRACORPOREAL SHOCK WAVE LITHOTRIPSY    . Home sleep study  08/31/2019   Severe OSA  . THYROIDECTOMY  08/2009   for medullary thyroid cancer ([facility]. Conley Canal at [facility] follows him for this).  . TRANSTHORACIC ECHOCARDIOGRAM  12/2012; 09/2017   2014-Normal LV size and wall thickness, LV EF 45-50%, with global hypokinesis of LV.  2018- EF 50-55%, normal wall motion, grd I DD, valves normal.    Family History  Problem Relation Age of Onset  . Arthritis Mother   . Hypertension Mother   . Alcohol abuse Father   . Arthritis Father   . Hypertension Father   . Colon cancer Father   . Prostate cancer Father      Current Outpatient Medications:  .  acetaminophen (TYLENOL) 325 MG tablet, Take 1-2 tablets (325-650 mg total) by mouth every 4 (four) hours as needed for mild pain., Disp: , Rfl:  .  amphetamine-dextroamphetamine (ADDERALL XR) 10 MG 24 hr capsule, TAKE 1 CAPSULE BY MOUTH EVERY MORNING (TO BE TAKEN WITH 30MG  XR CAPSULES), Disp: 30 capsule, Rfl: 0 .  amphetamine-dextroamphetamine (ADDERALL XR) 30 MG 24 hr capsule, Take 1 capsule (30 mg total) by mouth daily., Disp: 30 capsule, Rfl: 0 .  amphetamine-dextroamphetamine (ADDERALL) 20 MG tablet, TAKE 1 TABLET BY MOUTH ONCE DAILY AT 2 PM, Disp: 30 tablet, Rfl: 0 .  clotrimazole-betamethasone (LOTRISONE) cream, Apply to affected area bid prn, Disp: 45 g, Rfl: 11 .  FLUoxetine (PROZAC) 40 MG capsule, TAKE 1 CAPSULE (40 MG TOTAL) BY MOUTH DAILY., Disp: 90 capsule, Rfl: 0 .   glycopyrrolate (ROBINUL) 2 MG tablet, TAKE 1/2-1 TABLET BY MOUTH TWO TIMES DAILY AS NEEDED FOR EXCESSIVE ORAL SECRECTIONS, Disp: 60 tablet, Rfl: 6 .  levothyroxine (SYNTHROID) 150 MCG tablet, TAKE 1 TABLET BY MOUTH ONCE DAILY EXCEPT FOR SUNDAY SKIP DOSE, Disp: 30 tablet, Rfl: 2 .  omeprazole (PRILOSEC) 40 MG capsule, TAKE 1 CAPSULE BY MOUTH ONCE DAILY, Disp: 90 capsule, Rfl: 2 .  Simethicone 125 MG TABS, Take 125 mg by mouth every 6 (six) hours as needed for flatulence., Disp: , Rfl:  .  traZODone (DESYREL) 100 MG tablet, TAKE 1 TABLET BY MOUTH AT BEDTIME AS NEEDED FOR SLEEP. (Patient taking differently: TAKE 1 TABLET BY MOUTH AT BEDTIME AS NEEDED FOR SLEEP. Pt is taking 1.5 tablets), Disp: 90 tablet, Rfl: 3 .  ondansetron (ZOFRAN) 8 MG tablet, Take 1 tablet (8 mg total) by mouth every 8 (eight) hours as needed for nausea or vomiting. (Patient not taking: Reported on 01/06/2020), Disp: 20 tablet, Rfl: 1  EXAM:  VITALS per patient if  applicable: BP [id number] (BP Location: Left Arm, Patient Position: Sitting, Cuff Size: Large)   Pulse 86   SpO2 99%    GENERAL: alert, oriented, appears well and in no acute distress  HEENT: atraumatic, conjunttiva clear, no obvious abnormalities on inspection of external nose and ears  NECK: normal movements of the head and neck  LUNGS: on inspection no signs of respiratory distress, breathing rate appears normal, no obvious gross SOB, gasping or wheezing  CV: no obvious cyanosis  MS: moves all visible extremities without noticeable abnormality  PSYCH/NEURO: pleasant and cooperative, no obvious depression or anxiety, speech and thought processing grossly intact  LABS: none today  Lab Results  Component Value Date   WBC 6.1 08/02/2019   HGB 14.0 08/02/2019   HCT 43.3 08/02/2019   MCV 93.8 08/02/2019   PLT 232.0 08/02/2019      Chemistry      Component Value Date/Time   NA 137 08/02/2019 1335   K 4.2 08/02/2019 1335   CL 96 08/02/2019 1335   CO2 29  08/02/2019 1335   BUN 14 08/02/2019 1335   CREATININE 0.80 08/02/2019 1335   CREATININE 0.95 02/20/2013 1604      Component Value Date/Time   CALCIUM 9.8 08/02/2019 1335   ALKPHOS 84 08/02/2019 1335   AST 20 08/02/2019 1335   ALT 15 08/02/2019 1335   BILITOT 0.4 08/02/2019 1335     Lab Results  Component Value Date   TSH 6.39 (H) 08/02/2019    ASSESSMENT AND PLAN:  Discussed the following assessment and plan:  1) Chronic fatigue/attention deficit: multifactorial. He'll continue to try to get a morning dose of adderall xr in, plus add a booster dose of adderall short acting in the afternoon.  2) OSA, severe: has appt with Dr. Annamaria Boots to f/u this problem, possibly get on noninvasive ventilation hs.  3) Insomnia: trazodone 150mg  is max dose tolerated by pt and is somewhat helpful. Continue this.  4) Myotonic dystrophy, severely debilitated: he is very minimally ambulatory. Has transfer chair. Will do rx for manual WC. He declines HH PT/OT. Wife taking excellent care of him.  5) Hypothyroidism: monitor TSH --future order. Lab appt at his earliest convenience.   I discussed the assessment and treatment plan with the patient. The patient was provided an opportunity to ask questions and all were answered. The patient agreed with the plan and demonstrated an understanding of the instructions.   The patient was advised to call back or seek an in-person evaluation if the symptoms worsen or if the condition fails to improve as anticipated.  F/u: 3 mo  Signed:  Crissie Sickles, MD           04/13/2020

## 2020-04-13 NOTE — Telephone Encounter (Signed)
Patient had visit today with PCP, asked to do Rx for manual wheelchair with med supply company. Contacted pt's wife to see if they wanted the written rx or to use a certain med company. They will come pick up Rx and take to med supply store of their choice. Rx placed up front for pick up

## 2020-04-15 ENCOUNTER — Telehealth: Payer: 59 | Admitting: Family Medicine

## 2020-04-15 ENCOUNTER — Ambulatory Visit: Payer: 59 | Attending: Internal Medicine

## 2020-04-15 DIAGNOSIS — Z23 Encounter for immunization: Secondary | ICD-10-CM

## 2020-04-15 NOTE — Progress Notes (Signed)
   Covid-19 Vaccination Clinic  Name:  [name]    MRN: [id number] DOB: [birth year]  04/15/2020  [name] was observed post Covid-19 immunization for 15 minutes without incident. He was provided with Vaccine Information Sheet and instruction to access the V-Safe system.   [name] was instructed to call 911 with any severe reactions post vaccine: Marland Kitchen Difficulty breathing  . Swelling of face and throat  . A fast heartbeat  . A bad rash all over body  . Dizziness and weakness   Immunizations Administered    Name Date Dose VIS Date Route   Pfizer COVID-19 Vaccine 04/15/2020  2:58 PM 0.3 mL 02/19/2019 Intramuscular   Manufacturer: [place]   Lot: [id number]   [place]: [id number]

## 2020-04-16 ENCOUNTER — Ambulatory Visit: Payer: 59 | Admitting: Internal Medicine

## 2020-04-16 ENCOUNTER — Encounter: Payer: Self-pay | Admitting: Internal Medicine

## 2020-04-16 ENCOUNTER — Other Ambulatory Visit: Payer: Self-pay

## 2020-04-16 VITALS — BP 102/58 | HR 101 | Temp 97.3°F | Ht 67.0 in | Wt 210.5 lb

## 2020-04-16 DIAGNOSIS — K219 Gastro-esophageal reflux disease without esophagitis: Secondary | ICD-10-CM

## 2020-04-16 DIAGNOSIS — G4733 Obstructive sleep apnea (adult) (pediatric): Secondary | ICD-10-CM

## 2020-04-16 NOTE — Patient Instructions (Signed)
Order- new DME, new CPAP auto 5-20, mask of choice, humidifier, supplies, AirView/ card  Please call if we can help 

## 2020-04-16 NOTE — Progress Notes (Signed)
04/16/20- 58 yoM never smoker for sleep evaluation HST 09/04/2019- AHI 102/hr, desaturation to 86%, body weight 210 lbs Medical problem list includes Myotonic Muscular Dystrophy, Aspiration pneumonia, GERD, NASH, Thyroid Cancer/ Hypothyroid, Hx Acute Metabolic Encephalopathy,  Pulmonary Nodule,  Epworth score 16 Body weight 210 lbs Adderall XR 10 mg     Trazodone 100  Adderall XR 30 mg, Adderall 20 mg Wife describes him as snoring less than in past, but he sleeps a lot. No sleep meds, little caffeine.  Denies ENT surgery, active heart or lung disease. No cardiomyopathy. Admits dysphagia with occasional choking eating and drinking.   Prior to Admission medications   Medication Sig Start Date End Date Taking? Authorizing Provider  acetaminophen (TYLENOL) 325 MG tablet Take 1-2 tablets (325-650 mg total) by mouth every 4 (four) hours as needed for mild pain. 11/06/18  Yes Love, Ivan Anchors, PA-C  amphetamine-dextroamphetamine (ADDERALL XR) 10 MG 24 hr capsule TAKE 1 CAPSULE BY MOUTH EVERY MORNING (TO BE TAKEN WITH 30MG  XR CAPSULES) 01/06/20  Yes [name], [name], MD  amphetamine-dextroamphetamine (ADDERALL XR) 30 MG 24 hr capsule Take 1 capsule (30 mg total) by mouth daily. 01/06/20  Yes [name], [name], MD  amphetamine-dextroamphetamine (ADDERALL) 20 MG tablet TAKE 1 TABLET BY MOUTH ONCE DAILY AT 2 PM 04/08/20  Yes [name], [name], MD  clotrimazole-betamethasone (LOTRISONE) cream Apply to affected area bid prn 12/21/18  Yes [name], [name], MD  FLUoxetine (PROZAC) 40 MG capsule TAKE 1 CAPSULE (40 MG TOTAL) BY MOUTH DAILY. 03/04/20  Yes [name], [name], MD  glycopyrrolate (ROBINUL) 2 MG tablet TAKE 1/2-1 TABLET BY MOUTH TWO TIMES DAILY AS NEEDED FOR EXCESSIVE ORAL SECRECTIONS 02/24/20  Yes [name], [name], MD  levothyroxine (SYNTHROID) 150 MCG tablet TAKE 1 TABLET BY MOUTH ONCE DAILY EXCEPT FOR SUNDAY SKIP DOSE 11/13/19  Yes [name], [name], MD  omeprazole (PRILOSEC) 40 MG capsule TAKE 1 CAPSULE  BY MOUTH ONCE DAILY 09/10/19  Yes [name], [name], MD  ondansetron (ZOFRAN) 8 MG tablet Take 1 tablet (8 mg total) by mouth every 8 (eight) hours as needed for nausea or vomiting. 07/25/19  Yes [name], [name], MD  Simethicone 125 MG TABS Take 125 mg by mouth every 6 (six) hours as needed for flatulence.   Yes [provider]  traZODone (DESYREL) 100 MG tablet TAKE 1 TABLET BY MOUTH AT BEDTIME AS NEEDED FOR SLEEP. Patient taking differently: TAKE 1 TABLET BY MOUTH AT BEDTIME AS NEEDED FOR SLEEP. Pt is taking 1.5 tablets 07/15/19  Yes [name], [name], MD   Past Medical History:  Diagnosis Date  . Adult ADHD   . Aspiration pneumonitis ([place]) 04/2017, 08/2017   Recurrent  . Chronic respiratory failure ([place])    As of 04/2018, Dr. Chase Caller suspects hypercapnic RF--but [name] normal.  May eventually need nocturnal noninvasive ventilation.  Ultimately, if progresses enough pt will need trach.  . Constipation, chronic    Has caused irritative urinary/bladder symptoms in the past.  . Depression   . Dizziness 06/2011+   Noncontrast CT and noncontrast MRI both negative 06/2011 at [facility] ED visit.  . Elevated transaminase level    Liver biopsy revealed fatty liver  . Erectile dysfunction   . Facial fracture ([place]) 06/2011   Nondisplaced bilateral LeFort I fracture: no surgery required. (ENT, Dr. Wilburn Cornelia)   . Fatty liver disease, nonalcoholic   . GERD (gastroesophageal reflux disease)   . History of thyroid cancer 2010   Medullary carcinoma of thyroid. Thyroidectomy  08/2009 (Dr. Aida Puffer at [facility]).  Needs periodic serum calcitonin monitoring.  Marland Kitchen Hyperlipidemia   . Hypothyroidism   . LVH (left ventricular hypertrophy)    by EKG criteria 06/2011.  Hx of heart murmur--Followed by cardiology at [facility]: EF's have been "stable" per cardio office note 12/29/10.  His echo 12/2012 showed normal LV size and wall thickness but EF 45-50% and global LV hypokinesis was found.  . Myotonic dystrophy ([place]) Dx'd  1996   Progressive muscle weakness and swallowing dysfunction.  Initial dx by Dr. Jannifer Franklin, neurologist, but subsequent annual specialist f/u has been with Dr. Ladon Applebaum [facility].  Progressive debilitation--PT referral 04/2017, pt quit PT 05/2017.  . Nephrolithiasis   . Obesity, Class I, BMI 30-34.9   . OSA (obstructive sleep apnea)    SEVERE 08/2019 sleep study)  . Pulmonary nodule, right 05/2017   RUL, 7 mm sub solid nodule.  Resolution noted on f/u CT 08/2017.   Past Surgical History:  Procedure Laterality Date  . COLONOSCOPY  09/13/2007   normal (Dr. Deatra Ina).  . EXTRACORPOREAL SHOCK WAVE LITHOTRIPSY    . Home sleep study  08/31/2019   Severe OSA  . THYROIDECTOMY  08/2009   for medullary thyroid cancer (Dr. Conley Canal at [facility] follows him for this).  . TRANSTHORACIC ECHOCARDIOGRAM  12/2012; 09/2017   2014-Normal LV size and wall thickness, LV EF 45-50%, with global hypokinesis of LV.  2018- EF 50-55%, normal wall motion, grd I DD, valves normal.   Family History  Problem Relation Age of Onset  . Arthritis Mother   . Hypertension Mother   . Alcohol abuse Father   . Arthritis Father   . Hypertension Father   . Colon cancer Father   . Prostate cancer Father    Social History   Socioeconomic History  . Marital status: Married    Spouse name: Not on file  . Number of children: 1  . Years of education: Not on file  . Highest education level: Not on file  Occupational History  . Not on file  Tobacco Use  . Smoking status: Never Smoker  . Smokeless tobacco: Never Used  Substance and Sexual Activity  . Alcohol use: Yes    Comment: rare beer  . Drug use: No  . Sexual activity: Not on file  Other Topics Concern  . Not on file  Social History Narrative   Married, one son [name]).   Occupation: Market researcher, currently unemployed, working on appeal for medical disability as of 11/2011.  No tob or drugs.  Drinks alcohol socially.   Social Determinants of Health   Financial  Resource Strain:   . Difficulty of Paying Living Expenses:   Food Insecurity:   . Worried About Charity fundraiser in the Last Year:   . Arboriculturist in the Last Year:   Transportation Needs:   . Film/video editor (Medical):   Marland Kitchen Lack of Transportation (Non-Medical):   Physical Activity:   . Days of Exercise per Week:   . Minutes of Exercise per Session:   Stress:   . Feeling of Stress :   Social Connections:   . Frequency of Communication with Friends and Family:   . Frequency of Social Gatherings with Friends and Family:   . Attends Religious Services:   . Active Member of Clubs or Organizations:   . Attends Archivist Meetings:   Marland Kitchen Marital Status:   Intimate Partner Violence:   . Fear of Current or  Ex-Partner:   . Emotionally Abused:   Marland Kitchen Physically Abused:   . Sexually Abused:    ROS-see HPI   + = positive Constitutional:    weight loss, night sweats, fevers, chills, fatigue, lassitude. HEENT:   + headaches, difficulty swallowing,+ tooth/dental problems, sore throat,       sneezing, itching, ear ache, nasal congestion, post nasal drip, snoring CV:    chest pain, orthopnea, PND,+ swelling in lower extremities, anasarca, dizziness, palpitations Resp:   +shortness of breath with exertion or at rest.                productive cough,   +non-productive cough, coughing up of blood.              change in color of mucus.  wheezing.   Skin:    rash or lesions. GI:  +heartburn, +indigestion, abdominal pain, nausea, vomiting, diarrhea,                 change in bowel habits, loss of appetite GU: dysuria, change in color of urine, no urgency or frequency.   flank pain. MS:  + joint pain, stiffness, decreased range of motion, back pain. Neuro-     nothing unusual Psych:  change in mood or affect.  +depression or anxiety.   memory loss.  OBJ- Physical Exam   +Wheelchair, + overweight General- Alert, Oriented, Affect-appropriate, Distress- none acute,  +mumbling  inarticulate speech Skin- rash-none, lesions- none, excoriation- none Lymphadenopathy- none Head- atraumatic            Eyes- Gross vision intact, PERRLA, conjunctivae and secretions clear            Ears- Hearing, canals-normal            Nose- Clear, no-Septal dev, mucus, polyps, erosion, perforation             Throat- Mallampati IV , mucosa clear , drainage- none, tonsils- atrophic Neck- flexible , trachea midline, no stridor , thyroid nl, carotid no bruit Chest - symmetrical excursion , unlabored           Heart/CV- +RapidRRR , no murmur , no gallop  , no rub, nl s1 s2                           - JVD- none , edema- none, stasis changes- none, varices- none           Lung- clear to P&A, wheeze- none, cough- none , dullness-none, rub- none           Chest wall-  Abd-  Br/ Gen/ Rectal- Not done, not indicated Extrem- cyanosis- none, clubbing, none, atrophy- none, strength- nl Neuro- grossly intact to observation

## 2020-04-18 ENCOUNTER — Encounter: Payer: Self-pay | Admitting: Internal Medicine

## 2020-04-18 NOTE — Assessment & Plan Note (Signed)
Needs ongoing attention to reflux precautions and carefull swalloing

## 2020-04-18 NOTE — Assessment & Plan Note (Signed)
Severe OSA, complicated by muscle weakness. We discussed options and medical concerns Plan- CPAP. My need wife's help to fit mask.

## 2020-04-21 ENCOUNTER — Other Ambulatory Visit: Payer: Self-pay | Admitting: Family Medicine

## 2020-04-21 ENCOUNTER — Other Ambulatory Visit: Payer: Self-pay

## 2020-04-21 MED ORDER — AMPHETAMINE-DEXTROAMPHET ER 30 MG PO CP24: 30.0000 mg | ORAL_CAPSULE | Freq: Every day | ORAL | 0 refills | Status: DC

## 2020-04-21 MED ORDER — AMPHETAMINE-DEXTROAMPHET ER 10 MG PO CP24: ORAL_CAPSULE | ORAL | 0 refills | Status: DC

## 2020-04-21 NOTE — Telephone Encounter (Signed)
Requesting: adderall xr 10, 30mg  Contract:11/30/18 UDS:n/a Last Visit:04/13/20 Next Visit:advised to f/u 3 mo. Last Refill:01/06/20(30,0)  Please Advise. Medications pending

## 2020-04-29 ENCOUNTER — Encounter: Payer: Self-pay | Admitting: Family Medicine

## 2020-05-07 ENCOUNTER — Other Ambulatory Visit: Payer: Self-pay

## 2020-05-07 ENCOUNTER — Other Ambulatory Visit: Payer: Self-pay | Admitting: Family Medicine

## 2020-05-07 MED ORDER — AMPHETAMINE-DEXTROAMPHETAMINE 20 MG PO TABS: ORAL_TABLET | ORAL | 0 refills | Status: DC

## 2020-05-07 NOTE — Telephone Encounter (Signed)
Requesting: adderall Contract:11/30/18 UDS:n/a Last Visit:04/13/20 Next Visit:advised to f/u 69mo. Last Refill:04/08/20(30,0)  Please Advise. Medication pending

## 2020-05-07 NOTE — Telephone Encounter (Signed)
Pt's wife advised, okay per dpr

## 2020-05-11 NOTE — Telephone Encounter (Signed)
The order was sent to [facility]. When I called [facility] I spoke with Ivin Booty. She stated that they had been waiting on the signed CMN.  The CMN was signed on 05/01/20 and it was faxed to [facility] on 05/03/20. Ivin Booty stated that she would reach out to the patient

## 2020-05-28 ENCOUNTER — Other Ambulatory Visit: Payer: Self-pay | Admitting: Family Medicine

## 2020-06-01 ENCOUNTER — Other Ambulatory Visit: Payer: Self-pay | Admitting: Family Medicine

## 2020-06-01 ENCOUNTER — Other Ambulatory Visit: Payer: Self-pay

## 2020-06-04 ENCOUNTER — Other Ambulatory Visit: Payer: Self-pay

## 2020-06-04 DIAGNOSIS — G4733 Obstructive sleep apnea (adult) (pediatric): Secondary | ICD-10-CM | POA: Diagnosis not present

## 2020-06-04 NOTE — Telephone Encounter (Signed)
Requesting: adderall Contract:11/30/18 KVQ:OHCO Last Visit:02/12/20 Next Visit: advised to f/u 2 months. Last Refill:05/07/20(30,0)  Please Advise. Medication is pending

## 2020-06-05 MED ORDER — AMPHETAMINE-DEXTROAMPHETAMINE 20 MG PO TABS: ORAL_TABLET | ORAL | 0 refills | Status: DC

## 2020-06-15 ENCOUNTER — Other Ambulatory Visit: Payer: Self-pay | Admitting: Family Medicine

## 2020-06-15 NOTE — Telephone Encounter (Signed)
Patient has an appointment 06/16/20

## 2020-06-16 ENCOUNTER — Other Ambulatory Visit: Payer: Self-pay

## 2020-06-16 ENCOUNTER — Other Ambulatory Visit (INDEPENDENT_AMBULATORY_CARE_PROVIDER_SITE_OTHER): Payer: 59

## 2020-06-16 ENCOUNTER — Ambulatory Visit: Payer: 59

## 2020-06-16 DIAGNOSIS — E039 Hypothyroidism, unspecified: Secondary | ICD-10-CM | POA: Diagnosis not present

## 2020-06-16 LAB — TSH: TSH: 1.01 u[IU]/mL (ref 0.35–4.50)

## 2020-06-16 NOTE — Addendum Note (Signed)
Addended by: Lynnea Ferrier on: 06/16/2020 01:24 PM   Modules accepted: Orders

## 2020-06-17 ENCOUNTER — Other Ambulatory Visit: Payer: Self-pay

## 2020-06-17 MED ORDER — LEVOTHYROXINE SODIUM 150 MCG PO TABS: ORAL_TABLET | ORAL | 2 refills | Status: DC

## 2020-07-03 ENCOUNTER — Other Ambulatory Visit: Payer: Self-pay | Admitting: Family Medicine

## 2020-07-03 NOTE — Telephone Encounter (Signed)
RF request for trazadone 100mg  and adderall 20mg   Last OV 04/13/20 No upcoming appointment ( canceled 06/16/20 appt )   Due for f/u in mid July   Last RX for trazadone 07/15/2019 # 90 x 3 rfs  Last RX for adderall 20mg  06/05/20 # 30, no refill.  Please advise.

## 2020-07-04 DIAGNOSIS — G4733 Obstructive sleep apnea (adult) (pediatric): Secondary | ICD-10-CM | POA: Diagnosis not present

## 2020-07-06 ENCOUNTER — Other Ambulatory Visit: Payer: Self-pay

## 2020-07-13 ENCOUNTER — Encounter (HOSPITAL_BASED_OUTPATIENT_CLINIC_OR_DEPARTMENT_OTHER): Payer: Self-pay

## 2020-07-13 ENCOUNTER — Emergency Department (HOSPITAL_BASED_OUTPATIENT_CLINIC_OR_DEPARTMENT_OTHER)
Admission: EM | Admit: 2020-07-13 | Discharge: 2020-07-13 | Disposition: A | Discharge status: Home / Self Care | Payer: 59 | Attending: Emergency Medicine | Admitting: Emergency Medicine

## 2020-07-13 ENCOUNTER — Emergency Department (HOSPITAL_BASED_OUTPATIENT_CLINIC_OR_DEPARTMENT_OTHER): Payer: 59

## 2020-07-13 ENCOUNTER — Other Ambulatory Visit: Payer: Self-pay

## 2020-07-13 DIAGNOSIS — Z7951 Long term (current) use of inhaled steroids: Secondary | ICD-10-CM | POA: Diagnosis not present

## 2020-07-13 DIAGNOSIS — S01511A Laceration without foreign body of lip, initial encounter: Secondary | ICD-10-CM

## 2020-07-13 DIAGNOSIS — E039 Hypothyroidism, unspecified: Secondary | ICD-10-CM | POA: Insufficient documentation

## 2020-07-13 DIAGNOSIS — S92415A Nondisplaced fracture of proximal phalanx of left great toe, initial encounter for closed fracture: Secondary | ICD-10-CM | POA: Diagnosis not present

## 2020-07-13 DIAGNOSIS — Z0489 Encounter for examination and observation for other specified reasons: Secondary | ICD-10-CM | POA: Diagnosis present

## 2020-07-13 DIAGNOSIS — Z79899 Other long term (current) drug therapy: Secondary | ICD-10-CM | POA: Insufficient documentation

## 2020-07-13 DIAGNOSIS — W19XXXA Unspecified fall, initial encounter: Secondary | ICD-10-CM | POA: Insufficient documentation

## 2020-07-13 DIAGNOSIS — Z85850 Personal history of malignant neoplasm of thyroid: Secondary | ICD-10-CM | POA: Diagnosis not present

## 2020-07-13 DIAGNOSIS — Y929 Unspecified place or not applicable: Secondary | ICD-10-CM | POA: Insufficient documentation

## 2020-07-13 DIAGNOSIS — Y9389 Activity, other specified: Secondary | ICD-10-CM | POA: Diagnosis not present

## 2020-07-13 DIAGNOSIS — Y999 Unspecified external cause status: Secondary | ICD-10-CM | POA: Diagnosis not present

## 2020-07-13 MED ORDER — AMOXICILLIN-POT CLAVULANATE 875-125 MG PO TABS
1.0000 | ORAL_TABLET | Freq: Once | ORAL | Status: AC
  Administered 2020-07-13: 1 via ORAL
  Filled 2020-07-13: qty 1

## 2020-07-13 MED ORDER — LIDOCAINE HCL (PF) 1 % IJ SOLN
10.0000 mL | Freq: Once | INTRAMUSCULAR | Status: AC
  Administered 2020-07-13: 10 mL
  Filled 2020-07-13: qty 10

## 2020-07-13 MED ORDER — ACETAMINOPHEN 325 MG PO TABS
650.0000 mg | ORAL_TABLET | Freq: Once | ORAL | Status: AC
  Administered 2020-07-13: 650 mg via ORAL
  Filled 2020-07-13: qty 2

## 2020-07-13 MED ORDER — TETANUS-DIPHTH-ACELL PERTUSSIS 5-2.5-18.5 LF-MCG/0.5 IM SUSP
0.5000 mL | Freq: Once | INTRAMUSCULAR | Status: AC
  Administered 2020-07-13: 0.5 mL via INTRAMUSCULAR
  Filled 2020-07-13: qty 0.5

## 2020-07-13 MED ORDER — AMOXICILLIN-POT CLAVULANATE 875-125 MG PO TABS
1.0000 | ORAL_TABLET | Freq: Two times a day (BID) | ORAL | 0 refills | Status: DC
Start: 2020-07-13 — End: 2020-08-20

## 2020-07-13 NOTE — Discharge Instructions (Signed)
Please read instructions below.  Keep your wound clean. Eat soft foods with a spoon. Try not to bother the sutures as much as possible. The sutures on the outside of your lip will need to be removed in about 5 days. Please schedule an appointment with your primary care for wound recheck and suture removal in 5 days.  Rinse your mouth after each meal.  Take the antibiotic every 12 hours until gone. Apply ice for 15 minutes at a time, frequently throughout the day to help with swelling and pain. You can take tylenol every 6 hours as needed for pain Schedule an appointment with the sports medicine doctor in 1-2 weeks for recheck of your toe injury.  Elevate your foot as much as possible to help with pain. Apply ice for 20 minutes at a time. Return to the ER for fever, pus draining from wound, increasing redness around the wound, or new or worsening symptoms.

## 2020-07-13 NOTE — ED Triage Notes (Signed)
Pt arrives with wife who reports fall around 730 this morning. Pt has history of muscular dystrophy reports he had been sat up on the side of the bed and she thinks "he fell forward" states she did not see him fall, was in the other room, but went in right away, denies LOD and denies blood thinners. Pt has deep laceration thru left lower lip, poor/broken dententian prior to fall.

## 2020-07-13 NOTE — ED Provider Notes (Signed)
[facility] EMERGENCY DEPARTMENT Provider Note   CSN: [id number] Arrival date & time: 07/13/20  1696     History Chief Complaint  Patient presents with  . Fall  . Facial Injury    [name] is a 59 y.o. male with history of myotonic dystrophy, thyroid cancer, chronic respiratory failure, presenting emergency department with his wife after mechanical fall that occurred around 7:30 AM this morning.  His wife thinks he was sitting on the edge of the bed and may have leaned forward too far, causing him to fall forward and hit his face on the bottom of the bedside table.  He denies LOC.  He denies headache, vision changes, nausea.  He is not on blood thinners.  He has laceration to the lower lip that feels sore.  He denies any new dental injury -he has poor dentition throughout and was scheduled for last week for removal of all of his teeth however needed to be rescheduled and has not yet been completed.  He also complains of some left great toe pain that is feeling progressively more painful.  He states the toe caused him pain with ambulation after the fall.  The history is provided by the patient and the spouse.       Past Medical History:  Diagnosis Date  . Adult ADHD   . Aspiration pneumonitis ([place]) 04/2017, 08/2017   Recurrent  . Chronic respiratory failure ([place])    As of 04/2018, Dr. Chase Caller suspects hypercapnic RF--but [name] normal.  May eventually need nocturnal noninvasive ventilation.  Ultimately, if progresses enough pt will need trach.  . Constipation, chronic    Has caused irritative urinary/bladder symptoms in the past.  . Depression   . Dizziness 06/2011+   Noncontrast CT and noncontrast MRI both negative 06/2011 at [facility] ED visit.  . Elevated transaminase level    Liver biopsy revealed fatty liver  . Erectile dysfunction   . Facial fracture ([place]) 06/2011   Nondisplaced bilateral LeFort I fracture: no surgery required. (ENT, Dr. Wilburn Cornelia)   . Fatty liver  disease, nonalcoholic   . GERD (gastroesophageal reflux disease)   . History of thyroid cancer 2010   Medullary carcinoma of thyroid. Thyroidectomy 08/2009 (Dr. Aida Puffer at [facility]).  Needs periodic serum calcitonin monitoring.  Marland Kitchen Hyperlipidemia   . Hypothyroidism   . LVH (left ventricular hypertrophy)    by EKG criteria 06/2011.  Hx of heart murmur--Followed by cardiology at [facility]: EF's have been "stable" per cardio office note 12/29/10.  His echo 12/2012 showed normal LV size and wall thickness but EF 45-50% and global LV hypokinesis was found.  . Myotonic dystrophy ([place]) Dx'd 1996   Progressive muscle weakness and swallowing dysfunction.  Initial dx by Dr. Jannifer Franklin, neurologist, but subsequent annual specialist f/u has been with Dr. Ladon Applebaum [facility].  Progressive debilitation--PT referral 04/2017, pt quit PT 05/2017.  . Nephrolithiasis   . Obesity, Class I, BMI 30-34.9   . OSA (obstructive sleep apnea)    SEVERE 08/2019 sleep study). CPAP to be initiated as of 04/18/20 pulm (Dr. Annamaria Boots)  . Pulmonary nodule, right 05/2017   RUL, 7 mm sub solid nodule.  Resolution noted on f/u CT 08/2017.    Patient Active Problem List   Diagnosis Date Noted  . OSA (obstructive sleep apnea) 09/06/2019  . Myotonic dystrophy ([place])   . Abnormal chest x-ray   . Abnormal CT scan of lung   . Debility   . Leukopenia   . Encephalopathy  11/02/2018  . Acute metabolic encephalopathy 41/32/4401  . Pulmonary nodule 10/31/2018  . AKI (acute kidney injury) ([place]) 10/31/2018  . Generalized weakness 10/31/2018  . Fall 10/31/2018  . Weakness   . Aspiration pneumonia ([place]) 05/09/2017  . History of fall 05/09/2017  . Viral gastroenteritis 03/13/2014  . Autonomic dysfunction 09/05/2013  . Tachycardia 02/20/2013  . ADD (attention deficit disorder) 08/15/2012  . Depression 08/15/2012  . Carcinoma of thyroid gland ([place]) 01/23/2012  . Nonalcoholic fatty liver disease 01/23/2012  . History of thyroid cancer   . Dizziness,  nonspecific 12/07/2011  . Hypothyroidism 12/07/2011  . DEPRESSION 03/20/2008  . Myotonic muscular dystrophy ([place]) 03/20/2008  . GERD 03/20/2008    Past Surgical History:  Procedure Laterality Date  . COLONOSCOPY  09/13/2007   normal (Dr. Deatra Ina).  . EXTRACORPOREAL SHOCK WAVE LITHOTRIPSY    . Home sleep study  08/31/2019   Severe OSA  . THYROIDECTOMY  08/2009   for medullary thyroid cancer (Dr. Conley Canal at [facility] follows him for this).  . TRANSTHORACIC ECHOCARDIOGRAM  12/2012; 09/2017   2014-Normal LV size and wall thickness, LV EF 45-50%, with global hypokinesis of LV.  2018- EF 50-55%, normal wall motion, grd I DD, valves normal.       Family History  Problem Relation Age of Onset  . Arthritis Mother   . Hypertension Mother   . Alcohol abuse Father   . Arthritis Father   . Hypertension Father   . Colon cancer Father   . Prostate cancer Father     Social History   Tobacco Use  . Smoking status: Never Smoker  . Smokeless tobacco: Never Used  Vaping Use  . Vaping Use: Never used  Substance Use Topics  . Alcohol use: Yes    Comment: rare beer  . Drug use: No    Home Medications Prior to Admission medications   Medication Sig Start Date End Date Taking? Authorizing Provider  acetaminophen (TYLENOL) 325 MG tablet Take 1-2 tablets (325-650 mg total) by mouth every 4 (four) hours as needed for mild pain. 11/06/18   Love, Ivan Anchors, PA-C  amoxicillin-clavulanate (AUGMENTIN) 875-125 MG tablet Take 1 tablet by mouth every 12 (twelve) hours. 07/13/20   Kwamane Whack, [place] N, PA-C  amphetamine-dextroamphetamine (ADDERALL XR) 10 MG 24 hr capsule TAKE 1 CAPSULE BY MOUTH EVERY MORNING (TO BE TAKEN WITH 30MG  XR CAPSULES) 04/21/20   [name], [name], MD  amphetamine-dextroamphetamine (ADDERALL XR) 30 MG 24 hr capsule Take 1 capsule (30 mg total) by mouth daily. 04/21/20   [name], [name], MD  amphetamine-dextroamphetamine (ADDERALL) 20 MG tablet TAKE 1 TABLET BY MOUTH ONCE DAILY AT 2PM.  07/03/20   [name], [name], MD  clotrimazole-betamethasone (LOTRISONE) cream Apply to affected area bid prn 12/21/18   [name], [name], MD  FLUoxetine (PROZAC) 40 MG capsule TAKE 1 CAPSULE BY MOUTH ONCE DAILY 05/28/20   [name], [name], MD  glycopyrrolate (ROBINUL) 2 MG tablet TAKE 1/2-1 TABLET BY MOUTH TWO TIMES DAILY AS NEEDED FOR EXCESSIVE ORAL SECRECTIONS 02/24/20   [name], [name], MD  levothyroxine (SYNTHROID) 150 MCG tablet TAKE 1 TABLET BY MOUTH ONCE DAILY EXCEPT FOR SUNDAY SKIP DOSE 06/17/20   [name], [name], MD  omeprazole (PRILOSEC) 40 MG capsule TAKE 1 CAPSULE BY MOUTH ONCE DAILY 06/01/20   [name], [name], MD  ondansetron (ZOFRAN) 8 MG tablet Take 1 tablet (8 mg total) by mouth every 8 (eight) hours as needed for nausea or vomiting. 07/25/19   Shawnie Dapper  H, MD  Simethicone 125 MG TABS Take 125 mg by mouth every 6 (six) hours as needed for flatulence.    [provider]  traZODone (DESYREL) 100 MG tablet TAKE 1 TABLET BY MOUTH AT BEDTIME AS NEEDED FOR SLEEP. 07/03/20   [name], [name], MD    Allergies    Patient has no known allergies.  Review of Systems   Review of Systems  HENT: Positive for dental problem (no acute problem).   Eyes: Negative for visual disturbance.  Gastrointestinal: Negative for nausea.  Musculoskeletal: Positive for arthralgias. Negative for neck pain.  Skin: Positive for wound.  Neurological: Negative for headaches.  Hematological: Does not bruise/bleed easily.  All other systems reviewed and are negative.   Physical Exam Updated Vital Signs BP 105/67   Pulse 86   Temp 98.8 F (37.1 C) (Oral)   Resp 16   Ht 5\' 4"  (1.626 m)   Wt 100.7 kg   SpO2 96%   BMI 38.12 kg/m   Physical Exam Vitals and nursing note reviewed.  Constitutional:      General: He is not in acute distress.    Appearance: He is well-developed.  HENT:     Head: Normocephalic.     Comments: Small amount of bruising with 0.5cm superficial laceration to the  left lateral brow. No periorbital edema. No deformity or bony tenderness    Mouth/Throat:     Comments: Full thickness laceration to left lower lip. Wound crosses the vermilion border and is gaping. Chronically poor dentition throughout. No obvious additional intraoral injury. Eyes:     Extraocular Movements: Extraocular movements intact.     Conjunctiva/sclera: Conjunctivae normal.     Pupils: Pupils are equal, round, and reactive to light.     Comments: No entrapment or pain with EOM.  Cardiovascular:     Rate and Rhythm: Normal rate and regular rhythm.  Pulmonary:     Effort: Pulmonary effort is normal. No respiratory distress.  Abdominal:     Palpations: Abdomen is soft.  Musculoskeletal:     Cervical back: Normal range of motion. No tenderness.     Comments: TTP over interphalangeal joint of the left great toe. No bruising, swelling or deformity. Normal sensation. No subungual hematoma or wounds. Remainder of foot and ankle are not TTP, nl ROM.  Skin:    General: Skin is warm.  Neurological:     Mental Status: He is alert.  Psychiatric:        Behavior: Behavior normal.     ED Results / Procedures / Treatments   Labs (all labs ordered are listed, but only abnormal results are displayed) Labs Reviewed - No data to display  EKG None  Radiology DG Toe Great Left  Result Date: 07/13/2020 CLINICAL DATA:  Fall EXAM: LEFT GREAT TOE COMPARISON:  None. FINDINGS: Normal alignment with approximation of the joints. There is a small lucency involving the medial proximal phalanx base of the great toe. No focal soft tissue abnormalities. IMPRESSION: Medial proximal phalanx base nondisplaced fracture. Electronically Signed   By: Primitivo Gauze M.D.   On: 07/13/2020 09:54    Procedures .Marland KitchenLaceration Repair  Date/Time: 07/13/2020 11:08 AM Performed by: Ademide Schaberg, [place] N, PA-C Authorized by: Laruth Hanger, [place] N, PA-C   Consent:    Consent obtained:  Verbal   Consent given by:   Patient and spouse   Risks discussed:  Poor cosmetic result Anesthesia (see MAR for exact dosages):    Anesthesia method:  Local infiltration  Local anesthetic:  Lidocaine 1% WITH epi Laceration details:    Location:  Lip   Lip location:  Lower lip, full thickness   Vermilion border involved: yes     Height of lip laceration:  More than half vertical height   Length (cm):  3   Laceration depth: full thickness. Repair type:    Repair type:  Intermediate Pre-procedure details:    Preparation:  Patient was prepped and draped in usual sterile fashion Exploration:    Hemostasis achieved with:  Direct pressure   Wound exploration: entire depth of wound probed and visualized     Contaminated: no   Treatment:    Area cleansed with:  Saline   Amount of cleaning:  Standard   Irrigation solution:  Sterile water   Visualized foreign bodies/material removed: no   Subcutaneous repair:    Suture size:  5-0   Suture material:  Vicryl   Subcutaneous suture technique: deep dermal.   Number of sutures:  1 Mucous membrane repair:    Suture size:  5-0   Suture material:  Vicryl   Suture technique:  Simple interrupted   Number of sutures:  5 Skin repair:    Repair method:  Sutures   Suture size:  6-0   Suture material:  Prolene   Suture technique:  Simple interrupted   Number of sutures:  6 Approximation:    Approximation:  Close   Vermilion border: well-aligned   Post-procedure details:    Dressing:  Open (no dressing)   Patient tolerance of procedure:  Tolerated well, no immediate complications   (including critical care time)  Medications Ordered in ED Medications  lidocaine (PF) (XYLOCAINE) 1 % injection 10 mL (10 mLs Infiltration Given by Other 07/13/20 0948)  Tdap (BOOSTRIX) injection 0.5 mL (0.5 mLs Intramuscular Given 07/13/20 0949)  acetaminophen (TYLENOL) tablet 650 mg (650 mg Oral Given 07/13/20 0947)  amoxicillin-clavulanate (AUGMENTIN) 875-125 MG per tablet 1 tablet (1  tablet Oral Given 07/13/20 1132)    ED Course  I have reviewed the triage vital signs and the nursing notes.  Pertinent labs & imaging results that were available during my care of the patient were reviewed by me and considered in my medical decision making (see chart for details).    MDM Rules/Calculators/A&P                          Patient presenting from home after mechanical fall with complicated left lower lip laceration and left great toe pain.  Denies LOC, not on anticoagulation.  No headache, vision changes, nausea or vomiting.  No focal neuro findings.  Exam is nonconcerning for closed head injury.  He has severely poor dentition throughout, has no new dental complaint/injury.  Lip laceration is full-thickness, goes across and inferior to the vermilion border.  Patient also has some left great toe pain at the interphalangeal joint, no wounds.  Normal sensation.  X-ray with acute nondisplaced fracture of the proximal phalanx.  Postop shoe applied. Pt walks with a walker at home.   Laceration was closed with layered closure at bedside with adequate cosmetic outcome.  Tdap updated. Sports medicine referral provided for follow up on toe injury. Wound care discussed. Prophylactic augmentin for wound given severely poor dentition. Return precautions discussed.   Pt discussed with Dr. Tamera Punt, who agrees with workup and care plan for discharge.  Final Clinical Impression(s) / ED Diagnoses Final diagnoses:  Fall in home, initial encounter  Complicated laceration of lip, initial encounter  Closed nondisplaced fracture of proximal phalanx of left great toe, initial encounter    Rx / DC Orders ED Discharge Orders         Ordered    amoxicillin-clavulanate (AUGMENTIN) 875-125 MG tablet  Every 12 hours     Discontinue  Reprint     07/13/20 [place], [place] N, [place] 07/13/20 1305    Malvin Johns, MD 07/13/20 1328

## 2020-07-17 ENCOUNTER — Other Ambulatory Visit: Payer: Self-pay

## 2020-07-17 ENCOUNTER — Encounter: Payer: Self-pay | Admitting: Family Medicine

## 2020-07-17 ENCOUNTER — Ambulatory Visit (INDEPENDENT_AMBULATORY_CARE_PROVIDER_SITE_OTHER): Payer: 59 | Admitting: Family Medicine

## 2020-07-17 VITALS — BP 113/77 | HR 100 | Temp 97.4°F | Resp 16 | Ht 64.0 in | Wt 222.0 lb

## 2020-07-17 DIAGNOSIS — Z4802 Encounter for removal of sutures: Secondary | ICD-10-CM | POA: Diagnosis not present

## 2020-07-17 NOTE — Progress Notes (Signed)
OFFICE VISIT  07/17/2020   CC:  Chief Complaint  Patient presents with  . Stitch removal    ED visit on 7/19 for fall and sutures placed at that time   HPI:    Patient is a 59 y.o. Caucasian male who presents accompanied by his wife for "remove stitches on lip". On 07/13/20 he leaned forward to far on edge of bed, face fell into bottom of bedside table, lip laceration and great toe prox phalanx fx sustained. Lip lac sutured in ED.  tdap updated, prophylactic augmentin rx'd due to chronic poor dentition with the lac. Left great toe x-ray 07/13/20: FINDINGS: Normal alignment with approximation of the joints. There is a small lucency involving the medial proximal phalanx base of the great toe. No focal soft tissue abnormalities. IMPRESSION: Medial proximal phalanx base nondisplaced fracture.  Post op shoe placed in ED, sports med referral given to pt.  INTERIM HX: Sutures have been in place 4d. Some slight bleeding but o/w no probs.  He is able to eat. Minimal tightness feeling on lip, no pain. 6 absorbable sutures in place. 6 nonabs sutures total. Toe/fee feel normal.  Of note, he started trial of CPAP but could not tolerate wearing the mask so has not been able to continue with this treatment.    Also, he is scheduled to get teeth extracted pretty soon.   Past Medical History:  Diagnosis Date  . Adult ADHD   . Aspiration pneumonitis ([place]) 04/2017, 08/2017   Recurrent  . Chronic respiratory failure ([place])    As of 04/2018, Dr. Chase Caller suspects hypercapnic RF--but [name] normal.  May eventually need nocturnal noninvasive ventilation.  Ultimately, if progresses enough pt will need trach.  . Constipation, chronic    Has caused irritative urinary/bladder symptoms in the past.  . Depression   . Dizziness 06/2011+   Noncontrast CT and noncontrast MRI both negative 06/2011 at [facility] ED visit.  . Elevated transaminase level    Liver biopsy revealed fatty liver  . Erectile dysfunction    . Facial fracture ([place]) 06/2011   Nondisplaced bilateral LeFort I fracture: no surgery required. (ENT, Dr. Wilburn Cornelia)   . Fatty liver disease, nonalcoholic   . GERD (gastroesophageal reflux disease)   . History of thyroid cancer 2010   Medullary carcinoma of thyroid. Thyroidectomy 08/2009 (Dr. Aida Puffer at [facility]).  Needs periodic serum calcitonin monitoring.  Marland Kitchen Hyperlipidemia   . Hypothyroidism   . LVH (left ventricular hypertrophy)    by EKG criteria 06/2011.  Hx of heart murmur--Followed by cardiology at [facility]: EF's have been "stable" per cardio office note 12/29/10.  His echo 12/2012 showed normal LV size and wall thickness but EF 45-50% and global LV hypokinesis was found.  . Myotonic dystrophy ([place]) Dx'd 1996   Progressive muscle weakness and swallowing dysfunction.  Initial dx by Dr. Jannifer Franklin, neurologist, but subsequent annual specialist f/u has been with Dr. Ladon Applebaum [facility].  Progressive debilitation--PT referral 04/2017, pt quit PT 05/2017.  . Nephrolithiasis   . Obesity, Class I, BMI 30-34.9   . OSA (obstructive sleep apnea)    SEVERE 08/2019 sleep study). CPAP to be initiated as of 04/18/20 pulm (Dr. Annamaria Boots)  . Pulmonary nodule, right 05/2017   RUL, 7 mm sub solid nodule.  Resolution noted on f/u CT 08/2017.    Past Surgical History:  Procedure Laterality Date  . COLONOSCOPY  09/13/2007   normal (Dr. Deatra Ina).  . EXTRACORPOREAL SHOCK WAVE LITHOTRIPSY    . Home sleep study  08/31/2019   Severe OSA  . THYROIDECTOMY  08/2009   for medullary thyroid cancer (Dr. Conley Canal at [facility] follows him for this).  . TRANSTHORACIC ECHOCARDIOGRAM  12/2012; 09/2017   2014-Normal LV size and wall thickness, LV EF 45-50%, with global hypokinesis of LV.  2018- EF 50-55%, normal wall motion, grd I DD, valves normal.    Outpatient Medications Prior to Visit  Medication Sig Dispense Refill  . acetaminophen (TYLENOL) 325 MG tablet Take 1-2 tablets (325-650 mg total) by mouth every 4 (four) hours as needed  for mild pain.    Marland Kitchen amoxicillin-clavulanate (AUGMENTIN) 875-125 MG tablet Take 1 tablet by mouth every 12 (twelve) hours. 14 tablet 0  . amphetamine-dextroamphetamine (ADDERALL XR) 10 MG 24 hr capsule TAKE 1 CAPSULE BY MOUTH EVERY MORNING (TO BE TAKEN WITH 30MG  XR CAPSULES) 30 capsule 0  . amphetamine-dextroamphetamine (ADDERALL XR) 30 MG 24 hr capsule Take 1 capsule (30 mg total) by mouth daily. 30 capsule 0  . amphetamine-dextroamphetamine (ADDERALL) 20 MG tablet TAKE 1 TABLET BY MOUTH ONCE DAILY AT 2PM. 30 tablet 0  . clotrimazole-betamethasone (LOTRISONE) cream Apply to affected area bid prn 45 g 11  . FLUoxetine (PROZAC) 40 MG capsule TAKE 1 CAPSULE BY MOUTH ONCE DAILY 90 capsule 0  . glycopyrrolate (ROBINUL) 2 MG tablet TAKE 1/2-1 TABLET BY MOUTH TWO TIMES DAILY AS NEEDED FOR EXCESSIVE ORAL SECRECTIONS 60 tablet 6  . levothyroxine (SYNTHROID) 150 MCG tablet TAKE 1 TABLET BY MOUTH ONCE DAILY EXCEPT FOR SUNDAY SKIP DOSE 30 tablet 2  . omeprazole (PRILOSEC) 40 MG capsule TAKE 1 CAPSULE BY MOUTH ONCE DAILY 90 capsule 2  . Simethicone 125 MG TABS Take 125 mg by mouth every 6 (six) hours as needed for flatulence.    . traZODone (DESYREL) 100 MG tablet TAKE 1 TABLET BY MOUTH AT BEDTIME AS NEEDED FOR SLEEP. 90 tablet 3  . ondansetron (ZOFRAN) 8 MG tablet Take 1 tablet (8 mg total) by mouth every 8 (eight) hours as needed for nausea or vomiting. (Patient not taking: Reported on 07/17/2020) 20 tablet 1   No facility-administered medications prior to visit.    No Known Allergies  ROS As per HPI  PE: Vitals with BMI 07/17/2020 07/13/2020 07/13/2020  Height 5\' 4"  - 5\' 4"   Weight 222 lbs - 222 lbs 2 oz  BMI 17.61 - 60.7  Systolic 371 062 -  Diastolic 77 67 -  Pulse 694 86 -  O2 sat on RA today is 97%  Lower lip with 3 interrupted sutures across lip and 3 interrupted sutures in diagonal direction just below vermilion border.  A good amount of dried saliva that had crusted over the wound had to be  removed today to expose the sutures.  Removed all sutures today except one suture was cut I a way that left a tiny bit of suture still remainging beneath--this was the top/most superior suture.    LABS:    Chemistry      Component Value Date/Time   NA 137 08/02/2019 1335   K 4.2 08/02/2019 1335   CL 96 08/02/2019 1335   CO2 29 08/02/2019 1335   BUN 14 08/02/2019 1335   CREATININE 0.80 08/02/2019 1335   CREATININE 0.95 02/20/2013 1604      Component Value Date/Time   CALCIUM 9.8 08/02/2019 1335   ALKPHOS 84 08/02/2019 1335   AST 20 08/02/2019 1335   ALT 15 08/02/2019 1335   BILITOT 0.4 08/02/2019 1335     Lab Results  Component Value Date   WBC 6.1 08/02/2019   HGB 14.0 08/02/2019   HCT 43.3 08/02/2019   MCV 93.8 08/02/2019   PLT 232.0 08/02/2019    Lab Results  Component Value Date   TSH 1.01 06/16/2020   IMPRESSION AND PLAN:  Lower lip suture removal--one suture left partially in b/c difficulty getting it out due to pt discomfort.  Pts wife will check with dentist to see if this could be removed when he is sedated/under anesthesia when getting teeth extracted soon.  No sign of wound infection.   An After Visit Summary was printed and given to the patient.  FOLLOW UP: No follow-ups on file.  Signed:  Crissie Sickles, MD           07/17/2020

## 2020-07-22 ENCOUNTER — Encounter: Payer: Self-pay | Admitting: Internal Medicine

## 2020-07-23 ENCOUNTER — Ambulatory Visit: Payer: 59 | Admitting: Internal Medicine

## 2020-07-23 ENCOUNTER — Telehealth: Payer: Self-pay | Admitting: Internal Medicine

## 2020-07-23 DIAGNOSIS — G4733 Obstructive sleep apnea (adult) (pediatric): Secondary | ICD-10-CM

## 2020-07-23 NOTE — Telephone Encounter (Signed)
Called and spoke with pt's wife letting her know the info stated by CY and she verbalized understanding. Order placed for the cpap setting change. Nothing further needed.

## 2020-07-23 NOTE — Progress Notes (Deleted)
   ROS-see HPI   + = positive Constitutional:    weight loss, night sweats, fevers, chills, fatigue, lassitude. HEENT:   + headaches, difficulty swallowing,+ tooth/dental problems, sore throat,       sneezing, itching, ear ache, nasal congestion, post nasal drip, snoring CV:    chest pain, orthopnea, PND,+ swelling in lower extremities, anasarca, dizziness, palpitations Resp:   +shortness of breath with exertion or at rest.                productive cough,   +non-productive cough, coughing up of blood.              change in color of mucus.  wheezing.   Skin:    rash or lesions. GI:  +heartburn, +indigestion, abdominal pain, nausea, vomiting, diarrhea,                 change in bowel habits, loss of appetite GU: dysuria, change in color of urine, no urgency or frequency.   flank pain. MS:  + joint pain, stiffness, decreased range of motion, back pain. Neuro-     nothing unusual Psych:  change in mood or affect.  +depression or anxiety.   memory loss.  OBJ- Physical Exam   +Wheelchair, + overweight General- Alert, Oriented, Affect-appropriate, Distress- none acute,  +mumbling inarticulate speech Skin- rash-none, lesions- none, excoriation- none Lymphadenopathy- none Head- atraumatic            Eyes- Gross vision intact, PERRLA, conjunctivae and secretions clear            Ears- Hearing, canals-normal            Nose- Clear, no-Septal dev, mucus, polyps, erosion, perforation             Throat- Mallampati IV , mucosa clear , drainage- none, tonsils- atrophic Neck- flexible , trachea midline, no stridor , thyroid nl, carotid no bruit Chest - symmetrical excursion , unlabored           Heart/CV- +RapidRRR , no murmur , no gallop  , no rub, nl s1 s2                           - JVD- none , edema- none, stasis changes- none, varices- none           Lung- clear to P&A, wheeze- none, cough- none , dullness-none, rub- none           Chest wall-  Abd-  Br/ Gen/ Rectal- Not done, not  indicated Extrem- cyanosis- none, clubbing, none, atrophy- none, strength- nl Neuro- grossly intact to observation

## 2020-07-23 NOTE — Telephone Encounter (Signed)
Please ask [facility] to change CPAP auto range to 4- 15  Please tell Mr Pitstick to try the new settings, but if uncomfortable, leave it off until we talk.

## 2020-07-23 NOTE — Telephone Encounter (Signed)
Spoke with the pt's spouse  She states that pt is c/o feeling like he is smothering wearing his CPAP  He can only keep this on for approx 3 hours  Missed appt today b/c he thought it was a televisit but it was actually in office Next visit 08/21/20 is scheduled as televisit- is this OK? Spouse states pt has issues due to disability  I printed a DL that does not have much on it, but something to review  Please advise recs, thanks

## 2020-07-24 ENCOUNTER — Telehealth: Payer: Self-pay | Admitting: Family Medicine

## 2020-07-24 NOTE — Telephone Encounter (Signed)
ED provider referred to Dr Raeford Razor for f/u fall ---called pt to offer appt--left message for him to call office.  --glh

## 2020-08-04 DIAGNOSIS — G4733 Obstructive sleep apnea (adult) (pediatric): Secondary | ICD-10-CM | POA: Diagnosis not present

## 2020-08-20 ENCOUNTER — Other Ambulatory Visit: Payer: Self-pay

## 2020-08-20 ENCOUNTER — Other Ambulatory Visit: Payer: Self-pay | Admitting: Family Medicine

## 2020-08-21 ENCOUNTER — Ambulatory Visit: Payer: 59 | Admitting: Internal Medicine

## 2020-09-03 ENCOUNTER — Other Ambulatory Visit: Payer: Self-pay

## 2020-09-03 MED ORDER — LEVOTHYROXINE SODIUM 150 MCG PO TABS: ORAL_TABLET | ORAL | 2 refills | Status: DC

## 2020-09-05 ENCOUNTER — Other Ambulatory Visit: Payer: Self-pay | Admitting: Family Medicine

## 2020-09-12 ENCOUNTER — Other Ambulatory Visit: Payer: Self-pay | Admitting: Family Medicine

## 2020-09-14 ENCOUNTER — Telehealth: Payer: Self-pay | Admitting: Family Medicine

## 2020-09-14 NOTE — Telephone Encounter (Signed)
Electronic refill request sent to PCP for review

## 2020-09-14 NOTE — Telephone Encounter (Addendum)
RF request for Trazodone LOV:07/17/20, acute and 04/13/20 RCI Next ov: advised to f/u 3 months Last written: 07/03/20(90,3)  During 03/2020 virtual visit, Trazodone 100mg  increased to 1.5 tabs. Please advise, thanks. Medication pending

## 2020-09-14 NOTE — Telephone Encounter (Signed)
Nurse Assessment Nurse: Luvenia Heller, RN, Adriana Date/Time (Eastern Time): 09/12/2020 8:41:01 AM Confirm and document reason for call. If symptomatic, describe symptoms. ---Caller reports father needs a refill on Trazadone. Father is having trouble sleeping. Has the patient had close contact with a person known or suspected to have the novel coronavirus illness OR traveled / lives in area with major community spread (including international travel) in the last 14 days from the onset of symptoms? * If Asymptomatic, screen for exposure and travel within the last 14 days. ---No Does the patient have any new or worsening symptoms? ---Yes Will a triage be completed? ---Yes Related visit to physician within the last 2 weeks? ---No Does the PT have any chronic conditions? (i.e. diabetes, asthma, this includes High risk factors for pregnancy, etc.) ---Yes List chronic conditions. ---Myotonic dystrophy, depression, fatty liver Is this a behavioral health or substance abuse call? ---No Guidelines Guideline Title Affirmed Question Affirmed Notes Nurse Date/Time (Eastern Time) Insomnia Requesting medication for sleep ("sleeping pill") Luvenia Heller, RN, Fabio Bering 09/12/2020 8:44:25 AM Disp. Time Eilene Ghazi Time) Disposition Final User 09/12/2020 8:45:05 AM Call PCP within 24 Hours Yes Luvenia Heller, RN, Minna Merritts

## 2020-09-14 NOTE — Telephone Encounter (Signed)
Last refill for Trazodone was 07/03/20 #90 with 3 refills. LM for pt to return call.

## 2020-09-15 ENCOUNTER — Other Ambulatory Visit: Payer: Self-pay | Admitting: Family Medicine

## 2020-09-15 ENCOUNTER — Other Ambulatory Visit: Payer: Self-pay

## 2020-09-15 NOTE — Telephone Encounter (Signed)
New Rx sent in with updated sig. Patient's wife, [name] advised. Okay per dpr

## 2020-09-15 NOTE — Telephone Encounter (Signed)
Patient's wife, [name] advised refill sent.

## 2020-09-21 ENCOUNTER — Other Ambulatory Visit: Payer: Self-pay

## 2020-09-21 MED ORDER — GLYCOPYRROLATE 2 MG PO TABS: ORAL_TABLET | ORAL | 6 refills | Status: DC

## 2020-09-25 ENCOUNTER — Other Ambulatory Visit: Payer: Self-pay

## 2020-09-25 ENCOUNTER — Other Ambulatory Visit: Payer: Self-pay | Admitting: Family Medicine

## 2020-09-25 MED ORDER — AMPHETAMINE-DEXTROAMPHET ER 30 MG PO CP24: 30.0000 mg | ORAL_CAPSULE | Freq: Every day | ORAL | 0 refills | Status: DC

## 2020-09-25 MED ORDER — AMPHETAMINE-DEXTROAMPHET ER 10 MG PO CP24: ORAL_CAPSULE | ORAL | 0 refills | Status: DC

## 2020-09-25 NOTE — Telephone Encounter (Signed)
Requesting: adderall 10mg , 30mg  Contract:11/30/18 UDS:n/a Last Visit: 07/17/20 Next Visit: advised to f/u July for RCI follow up. Last Refill: 04/21/20 #30 for both medications  Please Advise. Medications pending

## 2020-09-25 NOTE — Telephone Encounter (Signed)
I'll do these rx's x 1 mo supply but he is due for recheck for these meds---it can be done virtually if he prefers.

## 2020-09-25 NOTE — Telephone Encounter (Signed)
Refills have already been addressed. Unable to deny refills due to security/override clearance. Please deny.

## 2020-09-25 NOTE — Telephone Encounter (Signed)
Patient's wife, [name] advised refills sent for 1 month but he is due for next f/u appt. She was ok to arrange next appointment, advised this could be virtual or in office. Opted for virtual appt, 10/8

## 2020-10-02 ENCOUNTER — Other Ambulatory Visit: Payer: Self-pay

## 2020-10-02 ENCOUNTER — Encounter: Payer: Self-pay | Admitting: Family Medicine

## 2020-10-02 ENCOUNTER — Telehealth (INDEPENDENT_AMBULATORY_CARE_PROVIDER_SITE_OTHER): Payer: 59 | Admitting: Family Medicine

## 2020-10-02 VITALS — Temp 97.1°F

## 2020-10-02 DIAGNOSIS — R5381 Other malaise: Secondary | ICD-10-CM

## 2020-10-02 DIAGNOSIS — G7111 Myotonic muscular dystrophy: Secondary | ICD-10-CM | POA: Diagnosis not present

## 2020-10-02 DIAGNOSIS — Z993 Dependence on wheelchair: Secondary | ICD-10-CM | POA: Diagnosis not present

## 2020-10-02 DIAGNOSIS — G4719 Other hypersomnia: Secondary | ICD-10-CM

## 2020-10-02 DIAGNOSIS — E039 Hypothyroidism, unspecified: Secondary | ICD-10-CM | POA: Diagnosis not present

## 2020-10-02 DIAGNOSIS — R5382 Chronic fatigue, unspecified: Secondary | ICD-10-CM | POA: Diagnosis not present

## 2020-10-02 DIAGNOSIS — G4700 Insomnia, unspecified: Secondary | ICD-10-CM | POA: Diagnosis not present

## 2020-10-02 DIAGNOSIS — G4733 Obstructive sleep apnea (adult) (pediatric): Secondary | ICD-10-CM

## 2020-10-02 NOTE — Progress Notes (Signed)
Virtual Visit via Video Note  I connected with [name] on 10/02/20 at  4:00 PM EDT by a video enabled telemedicine application and verified that I am speaking with the correct person using two identifiers.  Location patient: home Location provider:work or home office Persons participating in the virtual visit: patient, provider  I discussed the limitations of evaluation and management by telemedicine and the availability of in person appointments. The patient expressed understanding and agreed to proceed.  Telemedicine visit is a necessity given the COVID-19 restrictions in place at the current time.  HPI: 59 y/o WM with myotonic dystrophy being seen today for 6 mo f/u chronic medical problems. A/P as of last visit (telemed): "1) Chronic fatigue/attention deficit: multifactorial. He'll continue to try to get a morning dose of adderall xr in, plus add a booster dose of adderall short acting in the afternoon.  2) OSA, severe: has appt with Dr. Annamaria Boots to f/u this problem, possibly get on noninvasive ventilation hs.  3) Insomnia: trazodone 150mg  is max dose tolerated by [name] and is somewhat helpful. Continue this.  4) Myotonic dystrophy, severely debilitated: he is very minimally ambulatory. Has transfer chair. Will do rx for manual WC. He declines HH [name]/OT. Wife taking excellent care of him.  5) Hypothyroidism: monitor TSH --future order. Lab appt at his earliest convenience."  INTERIM HX: Fairly stable.  No new complaints. He can't walk on his own.  Has to be helped to his WC.  He can't self propel b/c strength in hands/fingers too weak to wrap around and grip wheels.  Teeth are all gone now/pulled 6 wks ago, will be going to get dentures. Appetite ok, eating soft diet.  Still sleeps a lot. Takes the 30 and 10mg  xr adderalls every morning. Note taking any IR adderall later in the afternoon.  He tried CPAP machine for 2 wks and could not tolerate it.  He coughs from excessive  secretions despite using robinul and coughing with CPAP on makes him feel smothered.  Hypoth: TSH 05/2020 was normal.    PMP AWARE reviewed today: most recent rx for adderall xr 10mg  was filled 09/25/20, # 30, rx by me.  Most recent rx for adderall xr 30mg  was filled 09/25/20, rx'd by me. No red flags.  ROS: no fevers, no CP, no SOB, no wheezing,no dizziness, no HAs, no rashes, no melena/hematochezia.  No polyuria or polydipsia.  No myalgias or arthralgias. No paresthesias or tremors.  No acute vision or hearing abnormalities. No n/v/d or abd pain.  No palpitations.     Past Medical History:  Diagnosis Date  . Adult ADHD   . Aspiration pneumonitis ([place]) 04/2017, 08/2017   Recurrent  . Chronic respiratory failure ([place])    As of 04/2018, Dr. Chase Caller suspects hypercapnic RF--but [name] normal.  May eventually need nocturnal noninvasive ventilation.  Ultimately, if progresses enough [name] will need trach.  . Constipation, chronic    Has caused irritative urinary/bladder symptoms in the past.  . Depression   . Dizziness 06/2011+   Noncontrast CT and noncontrast MRI both negative 06/2011 at [facility] ED visit.  . Elevated transaminase level    Liver biopsy revealed fatty liver  . Erectile dysfunction   . Facial fracture ([place]) 06/2011   Nondisplaced bilateral [place] I fracture: no surgery required. (ENT, Dr. Wilburn Cornelia)   . Fatty liver disease, nonalcoholic   . GERD (gastroesophageal reflux disease)   . History of thyroid cancer 2010   Medullary carcinoma of thyroid. Thyroidectomy 08/2009 (Dr. Gerald Stabs  [place] at [facility]).  Needs periodic serum calcitonin monitoring.  Marland Kitchen Hyperlipidemia   . Hypothyroidism   . LVH (left ventricular hypertrophy)    by EKG criteria 06/2011.  Hx of heart murmur--Followed by cardiology at [facility]: EF's have been "stable" per cardio office note 12/29/10.  His echo 12/2012 showed normal LV size and wall thickness but EF 45-50% and global LV hypokinesis was found.  . Myotonic dystrophy ([place])  Dx'd 1996   Progressive muscle weakness and swallowing dysfunction.  Initial dx by Dr. Jannifer Franklin, neurologist, but subsequent annual specialist f/u has been with Dr. Ladon Applebaum [facility].  Progressive debilitation--[name] referral 04/2017, [name] quit [name] 05/2017.  . Nephrolithiasis   . Obesity, Class I, BMI 30-34.9   . OSA (obstructive sleep apnea)    SEVERE 08/2019 sleep study). CPAP to be initiated as of 04/18/20 pulm (Dr. Annamaria Boots)  . Pulmonary nodule, right 05/2017   RUL, 7 mm sub solid nodule.  Resolution noted on f/u CT 08/2017.    Past Surgical History:  Procedure Laterality Date  . COLONOSCOPY  09/13/2007   normal (Dr. Deatra Ina).  . EXTRACORPOREAL SHOCK WAVE LITHOTRIPSY    . Home sleep study  08/31/2019   Severe OSA  . THYROIDECTOMY  08/2009   for medullary thyroid cancer (Dr. [facility] [facility] at [facility] follows him for this).  . TRANSTHORACIC ECHOCARDIOGRAM  12/2012; 09/2017   2014-Normal LV size and wall thickness, LV EF 45-50%, with global hypokinesis of LV.  2018- EF 50-55%, normal wall motion, grd I DD, valves normal.     Current Outpatient Medications:  .  acetaminophen (TYLENOL) 325 MG tablet, Take 1-2 tablets (325-650 mg total) by mouth every 4 (four) hours as needed for mild pain., Disp: , Rfl:  .  amphetamine-dextroamphetamine (ADDERALL XR) 10 MG 24 hr capsule, TAKE 1 CAPSULE BY MOUTH EVERY MORNING (TO BE TAKEN WITH 30MG  XR CAPSULES), Disp: 30 capsule, Rfl: 0 .  amphetamine-dextroamphetamine (ADDERALL XR) 30 MG 24 hr capsule, Take 1 capsule (30 mg total) by mouth daily., Disp: 30 capsule, Rfl: 0 .  clotrimazole-betamethasone (LOTRISONE) cream, APPLY TO THE AFFECTED AREA(S) TWICE DAILY AS NEEDED, Disp: 45 g, Rfl: 1 .  FLUoxetine (PROZAC) 40 MG capsule, TAKE 1 CAPSULE BY MOUTH ONCE DAILY, Disp: 90 capsule, Rfl: 0 .  glycopyrrolate (ROBINUL) 2 MG tablet, TAKE 1/2 TO 1 TABLET BY MOUTH TWO TIMES DAILY AS NEEDED FOR EXCESSIVE ORAL SECRETIONS, Disp: 60 tablet, Rfl: 6 .  levothyroxine (SYNTHROID) 150 MCG  tablet, TAKE 1 TABLET BY MOUTH ONCE DAILY EXCEPT FOR SUNDAY SKIP DOSE, Disp: 30 tablet, Rfl: 2 .  omeprazole (PRILOSEC) 40 MG capsule, TAKE 1 CAPSULE BY MOUTH ONCE DAILY, Disp: 90 capsule, Rfl: 2 .  Simethicone 125 MG TABS, Take 125 mg by mouth every 6 (six) hours as needed for flatulence., Disp: , Rfl:  .  traZODone (DESYREL) 100 MG tablet, TAKE 1.5 TABLETS BY MOUTH AT BEDTIME AS NEEDED FOR SLEEP., Disp: 135 tablet, Rfl: 3 .  ondansetron (ZOFRAN) 8 MG tablet, Take 1 tablet (8 mg total) by mouth every 8 (eight) hours as needed for nausea or vomiting. (Patient not taking: Reported on 10/02/2020), Disp: 20 tablet, Rfl: 1 .  penicillin v potassium (VEETID) 500 MG tablet, Take 500 mg by mouth 4 (four) times daily., Disp: , Rfl:   EXAM:  VITALS per patient if applicable:  Vitals with BMI 07/17/2020 07/13/2020 07/13/2020  Height 5\' 4"  - 5\' 4"   Weight 222 lbs - 222 lbs 2 oz  BMI 38.09 - 38.1  Systolic 962 952 -  Diastolic 77 67 -  Pulse 841 86 -     GENERAL: alert, oriented, appears well and in no acute distress  HEENT: atraumatic, conjunttiva clear, no obvious abnormalities on inspection of external nose and ears  NECK: normal movements of the head and neck  LUNGS: on inspection no signs of respiratory distress, breathing rate appears normal, no obvious gross SOB, gasping or wheezing  CV: no obvious cyanosis  MS: moves all visible extremities without noticeable abnormality  PSYCH/NEURO: pleasant and cooperative, no obvious depression or anxiety, speech and thought processing grossly intact  LABS: none today  Lab Results  Component Value Date   TSH 1.01 06/16/2020   Lab Results  Component Value Date   WBC 6.1 08/02/2019   HGB 14.0 08/02/2019   HCT 43.3 08/02/2019   MCV 93.8 08/02/2019   PLT 232.0 08/02/2019     Chemistry      Component Value Date/Time   NA 137 08/02/2019 1335   K 4.2 08/02/2019 1335   CL 96 08/02/2019 1335   CO2 29 08/02/2019 1335   BUN 14 08/02/2019 1335    CREATININE 0.80 08/02/2019 1335   CREATININE 0.95 02/20/2013 1604      Component Value Date/Time   CALCIUM 9.8 08/02/2019 1335   ALKPHOS 84 08/02/2019 1335   AST 20 08/02/2019 1335   ALT 15 08/02/2019 1335   BILITOT 0.4 08/02/2019 1335      ASSESSMENT AND PLAN:  Discussed the following assessment and plan:  1) Chronic fatigue/poor concentration and focus: multifactorial (progressive myotonic dystrophy, adult add, osa untreated): continue adderall 30mg  xr + 10mg  adderall xr qd.  2) Myotonic dystrophy: debilitation has progressed quite a bit over the last year or so. He cannot walk.  Cannot self propel a manual WC. He may be able to operate an electric WC so we'll need to do a mobility exam soon.  3) OSA: unfortunately he could not tolerate CPAP trial.  4) Hypothyroidism: stable TSH 4 mo ago.  Plan recheck at annual intervals.  5) Insomnia: unfortunately he sleeps a lot in daytime. Uses trazodone 150mg  hs and this helps so we'll continue this.  -we discussed possible serious and likely etiologies, options for evaluation and workup, limitations of telemedicine visit vs in person visit, treatment, treatment risks and precautions. [name] prefers to treat via telemedicine empirically rather than in person at this moment.    I discussed the assessment and treatment plan with the patient. The patient was provided an opportunity to ask questions and all were answered. The patient agreed with the plan and demonstrated an understanding of the instructions.   F/u: he'll make cpe appt at his convenience  Signed:  Crissie Sickles, MD           10/02/2020

## 2020-11-10 ENCOUNTER — Other Ambulatory Visit: Payer: Self-pay | Admitting: Family Medicine

## 2020-11-10 NOTE — Telephone Encounter (Signed)
Requesting: Adderall 10mg  Contract: 11/30/18 UDS:n/a Last Visit:10/02/20 Next Visit: n/a Last Refill:  Requesting: Adderall 30mg  Contract:11/30/18 UDS: n/a  Last Visit:10/02/20 Next Visit: n/a  Last Refill:  Please Advise. Medications pending

## 2020-11-11 ENCOUNTER — Other Ambulatory Visit: Payer: Self-pay | Admitting: Family Medicine

## 2020-11-12 NOTE — Telephone Encounter (Signed)
Patient's wife, [name] advised refill completed.

## 2020-11-16 ENCOUNTER — Telehealth: Payer: Self-pay

## 2020-11-16 DIAGNOSIS — G7111 Myotonic muscular dystrophy: Secondary | ICD-10-CM

## 2020-11-16 NOTE — Telephone Encounter (Signed)
-----   Message from Tammi Sou, MD sent at 11/13/2020  4:38 PM EST ----- Regarding: social worker Pls help! This patient is bedridden due to myotonic muscular dystrophy and his wife and son are having to stay home 24/7 to care for him.  This is the arrangement that they all prefer BUT since this means the son can't work and earn money he was wondering about any financial help that may be available to them in this situation.  Maybe a Education officer, museum could contact this family and see what their situation is and what help may be available? --thx!

## 2020-11-16 NOTE — Telephone Encounter (Signed)
[facility] referral ordered to assist with community resources.

## 2020-11-24 ENCOUNTER — Other Ambulatory Visit: Payer: Self-pay | Admitting: *Deleted

## 2020-11-24 NOTE — Patient Outreach (Signed)
[place] [facility]) Care Management  11/24/2020  TAYTE MCWHERTER 1961/10/20 [id number]   Telephone Screen   Referral Date :11/16/20 Referral source: PCP office  Referral reason : Social worker services, son unable to work due to caring for patient , Are there financial services available to help family?   Insurance : [place] attempt #1 Subjective: Unsuccessful outreach call to patient , no answer able to leave a HIPAA compliant voicemail message for return call.   Received incoming return call from [name], patient wife and Designated party release. Explained reason for call and referral reason.  [name] reports that patient is sleeping at this time and they don't usually awaken him.  Discussed referral from Dr. Anitra Lauth office with referral reason stated regarding financial services available to help family, son unable to work due to caring for patient. [name] clarified stating they were interested in information on whether patient son [name] that is providing 24 hour care for patient could be a paid caregiver and any other community resources available.   Social Patient lives at home with spouse and son [name] , wife works from home and along with son they provide 24 hour care. They prefer caring for patient in home and goal is to be able to care for him as long as possible without looking into long term care she feels he will give up. Patient is mostly dependent care, able to fed self with soft foods. Wife discussed that they have needed medical equipment , hospital bed, wheel chair , transfer board lift chair, she has prescription for hoyer lift but has not purchased yet. Discussed area DME companies that may be able fill prescription, she voiced being familiar with [employer] .  Family is able to provide transportation to appointments, most recent appointments have been virtual.  Discussed possibility of home health be able to teach exercises or to help  show effective ways with managing patient wife states patient has been hesitant in the past but she will discuss with him again. 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  Wife discussed patient being followed by Neurology clinic at [facility] for Muscular Dystrophy  in the past it has been a few years she plans to call and see if patient can be seen again for options in care. Reviewed [place] benefits of Employee Assistance Counseling program with wife she voiced being familiar with resource  and has contact number.       Conditions  Wife discussed patient conditions of Myotonic Muscular Dystrophy,  Debility requiring 24 hours support with daily care, assistance with turning in bed, getting out of bed, states he is able to take a few steps less than 10,with  2 plus assistance.  Patient has history of Obstructive sleep Apnea she reports that he did not tolerate cpap.    Advanced Directive  Patient does not have Advanced directive, wife is agreeable to receiving information packet, reviewed process completing, and notary resources, she has access to notary.    Plan Will send successful outreach letter to patient, with Advanced Directive packet and EMMI on advanced directive.  Will plan return call to patient wife within the next week timeframe discussed she is agreeable and to complete assessment .  Will place [facility] LCSW consult for community resource information.  Will send PCP barrier  involvement letter and this visit note.    [name], RN, BSN  [place] Management Coordinator  [phone]- Mobile [phone]- Toll Free Main Office

## 2020-11-25 ENCOUNTER — Encounter: Payer: Self-pay | Admitting: *Deleted

## 2020-11-25 ENCOUNTER — Other Ambulatory Visit: Payer: Self-pay | Admitting: *Deleted

## 2020-11-25 NOTE — Patient Outreach (Signed)
[facility] [facility]) Care Management  11/25/2020  XAI FRERKING 18-May-1961 [id number]   CSW made an initial attempt to try and contact patient today to perform the initial phone assessment, as well as assess and assist with social work needs and services, without success.  A HIPAA compliant message was left for patient on voicemail.  CSW is currently awaiting a return call.  CSW will make a second outreach attempt within the next 3-4 business days, if a return call is not received from patient in the meantime.  CSW will also mail a Patient Unsuccessful Outreach Letter to patient's home, requesting that he contact CSW if he is interested in receiving social work services through [place] with [facility].  [name], BSW, MSW, LCSW  Licensed Education officer, environmental Health System  Mailing [place] [address] [place], [place] [address] Physical Address-[address] [place], [place] [address] Toll Free Main # [phone] Fax # [phone] Cell # [phone]  [email or web address]

## 2020-12-01 ENCOUNTER — Other Ambulatory Visit: Payer: Self-pay | Admitting: *Deleted

## 2020-12-01 NOTE — Patient Outreach (Signed)
[facility]) Care Management  12/01/2020  TRAFTON ROKER 01/04/1961 [id number]   CSW made a second attempt to try and contact patient and patient's wife, Jaymarion Trombly again today, to perform the initial phone assessment on patient, as well as assess and assist with social work needs and services, without success.  A HIPAA compliant message was left on voicemail for patient and Mrs. Lerew, as CSW continues to await a return call.  CSW will make a third outreach attempt within the next 3-4 business days, if a return call is not received from patient or Mrs. Hiltunen in the meantime.    [name], BSW, MSW, LCSW  Licensed Education officer, environmental Health System  Mailing [place] [address], [place], [place] [address] Physical Address-[address] [place], [place] [address] Toll Free Main # [phone] Fax # [phone] Cell # [phone]  [email or web address]

## 2020-12-01 NOTE — Patient Outreach (Signed)
[facility]) Care Management  [place]  12/01/2020   [name] [birth year] [id number]   Telephone Assessment   Referral Date :11/16/20 Referral source: PCP office  Referral reason : Social worker services, son unable to work due to caring for patient , Are there financial services available to help family?   Insurance : [place] [employer]   Subjective:  Successful outreach call to patient wife, [name] , she reports patient is still resting in the bed. She discussed patient tends to not sleep well at night and naps later in the day.  Wife is interested in possibly having home health physical therapy to help with providing support to family to know how to maximize what patient can do at home and how they can safely manage at movement at home. She reports that patient appetite continues to be okay with softer texture of foods.  Discussed with wife , [facility] social worker call outreach attempts.       Encounter Medications:  Outpatient Encounter Medications as of 12/01/2020  Medication Sig  . acetaminophen (TYLENOL) 325 MG tablet Take 1-2 tablets (325-650 mg total) by mouth every 4 (four) hours as needed for mild pain.  . ADDERALL XR 30 MG 24 hr capsule TAKE 1 CAPSULE BY MOUTH DAILY.  Marland Kitchen amphetamine-dextroamphetamine (ADDERALL XR) 10 MG 24 hr capsule TAKE 1 CAPSULE BY MOUTH EVERY MORNING (TO BE TAKEN WITH 30MG  XR CAPSULES)  . clotrimazole-betamethasone (LOTRISONE) cream APPLY TO THE AFFECTED AREA(S) TWICE DAILY AS NEEDED  . FLUoxetine (PROZAC) 40 MG capsule TAKE 1 CAPSULE BY MOUTH ONCE DAILY  . glycopyrrolate (ROBINUL) 2 MG tablet TAKE 1/2 TO 1 TABLET BY MOUTH TWO TIMES DAILY AS NEEDED FOR EXCESSIVE ORAL SECRETIONS  . levothyroxine (SYNTHROID) 150 MCG tablet TAKE 1 TABLET BY MOUTH ONCE DAILY EXCEPT FOR SUNDAY SKIP DOSE  . omeprazole (PRILOSEC) 40 MG capsule TAKE 1 CAPSULE BY MOUTH ONCE DAILY  . ondansetron (ZOFRAN) 8 MG tablet Take 1 tablet (8 mg total)  by mouth every 8 (eight) hours as needed for nausea or vomiting. (Patient not taking: Reported on 10/02/2020)  . penicillin v potassium (VEETID) 500 MG tablet Take 500 mg by mouth 4 (four) times daily.  . Simethicone 125 MG TABS Take 125 mg by mouth every 6 (six) hours as needed for flatulence.  . traZODone (DESYREL) 100 MG tablet TAKE 1.5 TABLETS BY MOUTH AT BEDTIME AS NEEDED FOR SLEEP.   No facility-administered encounter medications on file as of 12/01/2020.    Functional Status:  In your present state of health, do you have any difficulty performing the following activities: 12/01/2020  Hearing? N  Vision? N  Difficulty concentrating or making decisions? Y  Walking or climbing stairs? Y  Comment dependent with mobility  Dressing or bathing? Y  Comment family assist  Doing errands, shopping? Y  Comment family assist  Preparing Food and eating ? Y  Comment family assist  In the past six months, have you accidently leaked urine? Y  Do you have problems with loss of bowel control? N  Managing your Medications? Y  Comment wife does  Managing your Finances? N  Comment wife Engineer, production or managing your Housekeeping? N  Some recent data might be hidden    Fall/Depression Screening: No flowsheet data found. PHQ 2/9 Scores 08/02/2019 02/12/2019 07/17/2018 04/11/2018 01/10/2018 09/07/2017  PHQ - 2 Score 6 2 3 3 5 5   PHQ- 9 Score 19 12 13 16 17  13  SDOH Screenings   Alcohol Screen:   . Last Alcohol Screening Score (AUDIT): Not on file  Depression (PHQ2-9):   . PHQ-2 Score: Not on file  Financial Resource Strain: Low Risk   . Difficulty of Paying Living Expenses: Not hard at all  Food Insecurity: No Food Insecurity  . Worried About Charity fundraiser in the Last Year: Never true  . Ran Out of Food in the Last Year: Never true  Housing: Low Risk   . Last Housing Risk Score: 0  Physical Activity:   . Days of Exercise per Week: Not on file  . Minutes of Exercise per Session:  Not on file  Social Connections:   . Frequency of Communication with Friends and Family: Not on file  . Frequency of Social Gatherings with Friends and Family: Not on file  . Attends Religious Services: Not on file  . Active Member of Clubs or Organizations: Not on file  . Attends Archivist Meetings: Not on file  . Marital Status: Not on file  Stress:   . Feeling of Stress : Not on file  Tobacco Use: Low Risk   . Smoking Tobacco Use: Never Smoker  . Smokeless Tobacco Use: Never Used  Transportation Needs: No Transportation Needs  . Lack of Transportation (Medical): No  . Lack of Transportation (Non-Medical): No   Assessment:  Goals Addressed            This Visit's Progress   . Find Help in My Community       Timeframe:  Short-Term Goal Priority:  High Start Date:   12/01/20                          Expected End Date:  01/26/20                     Follow Up Date 01/24/21   - follow-up on any referrals for help I am given    Why is this important?    Knowing how and where to find help for yourself or family in your neighborhood and community is an important skill.   You will want to take some steps to learn how.    Notes:  Wife to discuss home therapy evaluation with patient     . Matintain My Quality of Life       Timeframe:  Long-Range Goal Priority:  Medium Start Date:     12/01/20                        Expected End Date:   01/25/20                    Follow Up Date 01/24/21   - discuss my treatment options with the doctor or nurse  Maximize safety in caring for patient in home, consider physical therapy to help support wife and son in education on safe movement in home.   Why is this important?    Having a long-term illness can be scary.   It can also be stressful for you and your caregiver.   These steps may help.    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 Patient has been hesitant ,  wife to discuss further and follow.  Will benefit additional information Muscular Dystrophy community resources.   Plan:  Follow-up:  Patient wife agrees to follow up call in the next week at agreed upon time frame.  Placed call to Muscular Dystrophy Association spoke with representative that stats local MDA offices closed, provided by email address and she will forward general MDA resource information . Will send PCP this initial note.   [name], RN, BSN  [facility] Management Coordinator  [phone]- Mobile [phone]- Toll Free Main Office

## 2020-12-07 ENCOUNTER — Encounter: Payer: Self-pay | Admitting: *Deleted

## 2020-12-07 ENCOUNTER — Other Ambulatory Visit: Payer: Self-pay | Admitting: *Deleted

## 2020-12-07 NOTE — Patient Outreach (Signed)
[name]) Care [name]  12/07/2020  [name] 1961-11-09 [id number]   [name] was able to make initial contact with patient and patient's wife, [name] today, to perform the initial phone assessment, as well as assess and assist with social work needs and [name].  [name] introduced self, explained role and types of [name] provided through Cameron [name] ([place] [name]).  [name] further explained to patient and [name] that [name] works with patient's RNCM, also with [name], [name].  [name] then explained the [name] for the call, indicating that [name] thought that patient and [name] would benefit from social work [name] and resources to assist with arranging in-home care [name], assist with trying to get patient's son paid to provide in-home care [name], as well as provide counseling and supportive [name] for symptoms of depression.  [name] obtained two HIPAA compliant identifiers from patient and Mrs. [name], which included patient's name and date of birth.  [name] reported that her and her son, [name] are currently taking turns providing 24 hour care and supervision to patient, not only requesting assistance with obtaining respite care for themselves, but also additional in-home care [name] for patient.  [name] first inquired as to whether or not patient is currently an active Adult Medicaid recipient, for which [name] denied.  [name] encouraged [name] to apply for Adult Medicaid for patient, at her earliest convenience, providing her with the contact number and address for the [place].  [name] further explained to [name] that because patient was eligible for Social Security Disability, through [employer], he will more than likely be approved for Adult Medicaid as well.  [name] agreed to e-mail [name] [name]), per  her request, a [place] of Health and Estate agent for [place], as well as 2021 Medicaid Tips.  [name] went on to explain to [name] that if patient is eligible to receive Adult Medicaid, then he can apply for [name] ([name]), through [employer], and/or [name]), through the [place], as both programs are government funded, not requiring the recipient of in-home care [name] to pay anything out-of-pocket.  [name] voiced understanding and was agreeable to applying for [facility] for patient.  With that being said, [name] also agreed to e-mail [name] the following list of resources and applications: Request for Independent Assessment for Personal Care [name] (PCS) Attestation of Medical Need Application, Instructions for PCS Application Completion and Submission, and a Complete List of PCS Providers.  In addition, [name] will e-mail Mrs. [name] a List of [place] and a List of [employer].  [name] is aware that [name] mailed patient an Garment/textile technologist ([place] documents), offering to assist with completion, if necessary.  With regards to assisting [name] with getting paid to provide caregiver [name] to patient, [name] encouraged [name] to have him contact [employer], as well as the [employer]. [name] to inquire, providing her with the contact number for both.  [name] admitted that patient has been struggling with symptoms of Depression, for which [name] offered to provide free telephonic counseling [name], weekly or bi-weekly, whichever he prefers, if agreeable.  [name] agreed to have this discussion with patient to obtain approval.  [name] explained to [name] that  if patient does not feel comfortable conversing with [name] via phone, [name] is  more than happy to try and establish virtual visits, or even provide him with a list of available providers (therapists and psychiatrists) in the community.  Patient denies experiencing symptoms of homicidal or suicidal ideations and admitted to taking his psychotropic medications exactly as prescribed.  Mrs. [name] stated that she would provide [name] with an answer during our next telephone conversation.    [name] indicated that [name] is currently working with patient's Primary Care Physician, [name] to obtain an order for home health physical therapy [name].  [name] agreed to route this note to [name] and [name], as well as send an In Basket Message to [name] encouraging her to also arrange a home health bath aid for patient.  [name] denied being able to identify any additional social work needs at present, agreeing to follow-up with [name] as soon as she has had an opportunity to review all of the information and applications that [name] e-mailed to her today.  [name] admitted that it may take her a week to review everything, but that she would contact [name] directly when she is ready to move forward, or if she has specific questions about the applications, or application processes.  Otherwise, [name] was agreeable to having [name] follow-up with her again on Thursday, December 17, 2020, around 10:00AM.  [name], BSW, MSW, [employer]  Licensed Clinical Social Worker  [place]  Mailing [place] [address], [place], [place] [address] Physical Address[address] [place],  [address] Toll Free Main # [phone] Fax # [phone] Cell # [phone]  [name]

## 2020-12-09 ENCOUNTER — Telehealth: Payer: Self-pay | Admitting: Family Medicine

## 2020-12-09 ENCOUNTER — Other Ambulatory Visit (HOSPITAL_COMMUNITY): Payer: Self-pay | Admitting: *Deleted

## 2020-12-09 ENCOUNTER — Other Ambulatory Visit: Payer: Self-pay | Admitting: *Deleted

## 2020-12-09 NOTE — Telephone Encounter (Signed)
Per Maudie Mercury with [facility] management, patient is requesting referral for home health PT. If agreeable, please place referral order. Her callback number is [phone].

## 2020-12-09 NOTE — Telephone Encounter (Signed)
Yes, pls do order. Dx: debilitated patient, generalized weakness, myotonic muscular dystrophy--thx

## 2020-12-09 NOTE — Patient Outreach (Signed)
[facility]) Care Management  12/09/2020  [name] 01-22-1961 [id number]   [name] 03-01-1961 [id number]   Telephone Assessment   Referral Date :11/16/20 Referral source: PCP office  Referral reason : Social worker services, son unable to work due to caring for patient , Are there financial services available to help family?  Insurance : [place]   Subjective  Successful outreach call to patient wife for follow up.  She discussed patient having dental procedure on today, and now resting. She voiced appreciation of [facility] LCSW consult and resource support information.  Follow up with [name] regarding patient decision regarding home health  physical therapy evaluation to help maximize what patient can do at home and to help family with safe movement with patient in home, although he declines bath aide services that was also discussed as possible support services.  Discussed my follow up regarding Muscular Dystropy Association for resource support information, patient wife agreeable to receiving information in her secure [name]  email. She discussed plans also to follow up with [name] at [place] [facility] at [facility].  Patient wife verified receiving information on Advanced Directives and plans to read over.   Goals Addressed            This Visit's Progress   . Find Help in My Community   On track    Timeframe:  Short-Term Goal Priority:  High Start Date:   12/01/20                          Expected End Date:  01/26/20                     Follow Up Date 01/24/21   - follow-up on any referrals for help I am given    Why is this important?    Knowing how and where to find help for yourself or family in your neighborhood and community is an important skill.   You will want to take some steps to learn how.    Notes:  Wife to discuss home therapy evaluation with patient     . Matintain My  Quality of Life   On track    Timeframe:  Long-Range Goal Priority:  Medium Start Date:     12/01/20                        Expected End Date:   01/25/20                    Follow Up Date 01/24/21   - discuss my treatment options with the doctor or nurse  Maximize safety in caring for patient in home, consider physical therapy to help support wife and son in education on safe movement in home.   Why is this important?    Having a long-term illness can be scary.   It can also be stressful for you and your caregiver.   These steps may help.    Notes:         Plan Patient agreeable to follow up and plan of care, scheduled next follow up within agreeable timeframe, encouraged to notify sooner for new concerns Sent MDA information to patient wife per preferred email discussed. Placed call to Dr. Anitra Lauth office to request referral for home health physical therapy.    [name], RN, BSN  [facility] Management  Coordinator  [phone]- Mobile [phone]- [place]

## 2020-12-09 NOTE — Telephone Encounter (Signed)
Ok for referral?

## 2020-12-10 ENCOUNTER — Other Ambulatory Visit: Payer: Self-pay

## 2020-12-10 DIAGNOSIS — R5381 Other malaise: Secondary | ICD-10-CM

## 2020-12-10 DIAGNOSIS — G7111 Myotonic muscular dystrophy: Secondary | ICD-10-CM

## 2020-12-10 DIAGNOSIS — R531 Weakness: Secondary | ICD-10-CM

## 2020-12-10 NOTE — Telephone Encounter (Signed)
Patient's wife, [name] advised that home health PT referral entered. LM for Maudie Mercury w/ [facility] care management to notify as well.

## 2020-12-11 ENCOUNTER — Ambulatory Visit: Payer: 59 | Attending: Internal Medicine

## 2020-12-11 ENCOUNTER — Other Ambulatory Visit (HOSPITAL_BASED_OUTPATIENT_CLINIC_OR_DEPARTMENT_OTHER): Payer: Self-pay | Admitting: Internal Medicine

## 2020-12-11 DIAGNOSIS — Z23 Encounter for immunization: Secondary | ICD-10-CM

## 2020-12-11 NOTE — Progress Notes (Signed)
   Covid-19 Vaccination Clinic  Name:  [name]    MRN: [id number] DOB: [birth year]  12/11/2020  [name] was observed post Covid-19 immunization for 15 minutes without incident. He was provided with Vaccine Information Sheet and instruction to access the V-Safe system.   [name] was instructed to call 911 with any severe reactions post vaccine: Marland Kitchen Difficulty breathing  . Swelling of face and throat  . A fast heartbeat  . A bad rash all over body  . Dizziness and weakness   Immunizations Administered    Name Date Dose VIS Date Route   Pfizer COVID-19 Vaccine 12/11/2020  1:03 PM 0.3 mL 10/14/2020 Intramuscular   Manufacturer: [place]   Lot: [id number]   [place]: [id number]

## 2020-12-17 ENCOUNTER — Other Ambulatory Visit: Payer: Self-pay | Admitting: *Deleted

## 2020-12-17 ENCOUNTER — Encounter: Payer: Self-pay | Admitting: *Deleted

## 2020-12-17 NOTE — Patient Outreach (Signed)
[facility]) Care Management  12/17/2020  [name] September 05, 1961 [id number]  CSW was able to make contact with patient's wife, Iven Earnhart today, to follow-up regarding social work services and resources for patient, as well as to confirm that she received the packet of resource information and applications that CSW e-mailed to her home, per her request, on Monday, December 07, 2020.  Mrs. Minney confirmed receipt, indicating that she is currently in the process of completing patient's application for Adult Medicaid, and hopes to be able to submit it to the [place] for processing within the next week.  Mrs. Matranga reported that she will hold off on completing the applications for [employer] ([place]), through [employer], or CAPS Forensic scientist), also through the [place], until she finds out whether or not patient has been approved for Adult Medicaid.    Both PCS and CAPS are government funded programs, requiring recipients to be actively enrolled in full Adult Medicaid coverage.  CSW voiced understanding, offering to assist [name] with completion of the applications for Adult Medicaid, PCS and CAPS.  However, Mrs. Filley politely declined, indicating that the forms are very self-explanatory, admitting that she is used to filling out various types of applications on patient's behalf.  With regards to assisting patient's son, Siah Steely with getting paid to provide caregiver services to patient in the home, CSW asked Mrs. Haslam whether or not she has had an opportunity to contact [employer], or [employer] Department of Health and [employer], to inquire.  Mrs. Dumond denied, again indicating that she would like to determine whether or not patient has been approved for Adult Medicaid before proceeding any further.    In terms  of counseling and supportive services for patient, to try and help him better cope with his symptoms of depression, Mrs. Zeimet reported that she had a lengthy conversation with him, explaining CSW's willingness to provide free telephonic and/or virtual counseling sessions, but patient adamantly declined.  Mrs. Clyne went on to explain that patient was not even receptive to having CSW send them a list of community resources, a list of local support groups, or a list of therapists/psychiatrists in [facility].  Mrs. Gains confirmed that patient's RNCM, [name], also with [facility] Care Management, was able to obtain an order from Dr. Anitra Lauth for patient to receive home health physical therapy services, to help with strengthening, conditioning, mobility, safety, ambulation, etc.  Mrs. [name] was also able to assist Mrs. Bulluck in getting established with Muscular Dystrophy.   CSW will perform a case closure on patient, as all goals of treatment have been met from social work standpoint and no additional social work needs have been identified at this time.  CSW will notify Mrs. [name] of CSW's plans to close patient's case.  CSW will fax an update to Dr. Anitra Lauth to ensure that he is aware of CSW's involvement with patient's plan of care, in addition to routing a Physician Case Closure Letter.  CSW was able to confirm that Mrs. Coll has the correct contact information for CSW, encouraging her to contact CSW directly if she changes her mind about needing assistance with application completion or submission, if patient changes his mind about wanting to receive free telephonic counseling services, or if additional social work needs arise in the near future.  Mrs. Deaton voiced understanding and was agreeable to this plan, most appreciative  of all assistance offered to them at this time.    [name], BSW, MSW, LCSW  Licensed Education officer, environmental Health  System  Mailing [place] [address] [place], [place] [address] Physical Address-[address] [place], [place] [address] Toll Free Main # [phone] Fax # [phone] Cell # [phone]  [email or web address]

## 2020-12-21 ENCOUNTER — Other Ambulatory Visit (HOSPITAL_COMMUNITY): Payer: Self-pay | Admitting: *Deleted

## 2020-12-23 ENCOUNTER — Other Ambulatory Visit: Payer: Self-pay | Admitting: *Deleted

## 2020-12-23 NOTE — Patient Outreach (Signed)
[facility]) Care Management  [facility] Care Manager  12/23/2020   [name] 11-09-1961 [id number]   Telephone Assessment   Referral Date :11/16/20 Referral source: PCP office  Referral reason : Social worker services, son unable to work due to caring for patient , Are there financial services available to help family?  Insurance : [place] [employer]   Subjective:  Successful outreach call to patient wife [name] , she reports doing okay. She was pleased to report that patient is agreeable to trying  home health physical  therapy and initial home visit with [facility] is scheduled for 12/28/20. She discussed some hesitancy with getting patient to dental appointment he was finally agreeable. She states that he will have a follow up appointment in the next 3 weeks prior to being able to get dentures.  Wife voiced being pleased with patient improved appetite during the holiday most he had eaten in a while.  Wife continues to review information sent on MDA , wife plans to address in a few weeks with patient about following up with Dr. Blain Pais, Neurology  at [facility] that he has seen in the past, to avoid addressing  too many new things at a time.    Encounter Medications:  Outpatient Encounter Medications as of 12/23/2020  Medication Sig  . acetaminophen (TYLENOL) 325 MG tablet Take 1-2 tablets (325-650 mg total) by mouth every 4 (four) hours as needed for mild pain.  . ADDERALL XR 30 MG 24 hr capsule TAKE 1 CAPSULE BY MOUTH DAILY.  Marland Kitchen amphetamine-dextroamphetamine (ADDERALL XR) 10 MG 24 hr capsule TAKE 1 CAPSULE BY MOUTH EVERY MORNING (TO BE TAKEN WITH 30MG  XR CAPSULES)  . clotrimazole-betamethasone (LOTRISONE) cream APPLY TO THE AFFECTED AREA(S) TWICE DAILY AS NEEDED  . FLUoxetine (PROZAC) 40 MG capsule TAKE 1 CAPSULE BY MOUTH ONCE DAILY  . glycopyrrolate (ROBINUL) 2 MG tablet TAKE 1/2 TO 1 TABLET BY MOUTH TWO TIMES DAILY AS NEEDED FOR EXCESSIVE  ORAL SECRETIONS  . levothyroxine (SYNTHROID) 150 MCG tablet TAKE 1 TABLET BY MOUTH ONCE DAILY EXCEPT FOR SUNDAY SKIP DOSE  . omeprazole (PRILOSEC) 40 MG capsule TAKE 1 CAPSULE BY MOUTH ONCE DAILY  . ondansetron (ZOFRAN) 8 MG tablet Take 1 tablet (8 mg total) by mouth every 8 (eight) hours as needed for nausea or vomiting. (Patient not taking: Reported on 10/02/2020)  . penicillin v potassium (VEETID) 500 MG tablet Take 500 mg by mouth 4 (four) times daily.  . Simethicone 125 MG TABS Take 125 mg by mouth every 6 (six) hours as needed for flatulence.  . traZODone (DESYREL) 100 MG tablet TAKE 1.5 TABLETS BY MOUTH AT BEDTIME AS NEEDED FOR SLEEP.   No facility-administered encounter medications on file as of 12/23/2020.    Functional Status:  In your present state of health, do you have any difficulty performing the following activities: 12/07/2020 12/01/2020  Hearing? N N  Vision? N N  Difficulty concentrating or making decisions? N Y  Walking or climbing stairs? Y Y  Comment Patient has Myotonic Muscular Dystrophy - wife and son assist dependent with mobility  Dressing or bathing? Y Y  Comment Patient has Myotonic Muscular Dystrophy - wife and son assist family assist  Doing errands, shopping? Y Y  Comment Patient has Myotonic Muscular Dystrophy - wife and son assist family assist  Preparing Food and eating ? Y Y  Comment Patient has Myotonic Muscular Dystrophy - wife and son assist family assist  Using the  Toilet? Y -  Comment Patient has Myotonic Muscular Dystrophy - wife and son assist -  In the past six months, have you accidently leaked urine? Y Y  Comment Patient has Myotonic Muscular Dystrophy - wife and son assist -  Do you have problems with loss of bowel control? N N  Managing your Medications? Y Y  Comment Patient has Myotonic Muscular Dystrophy - wife and son assist wife does  Managing your Finances? Y N  Comment Patient has Myotonic Muscular Dystrophy - wife and son assist  wife manages  Housekeeping or managing your Housekeeping? Y N  Comment Patient has Myotonic Muscular Dystrophy - wife and son assist -  Some recent data might be hidden    Fall/Depression Screening: Fall Risk  12/07/2020  Falls in the past year? 1  Number falls in past yr: 1  Injury with Fall? 1  Risk for fall due to : History of fall(s);Impaired balance/gait;Impaired mobility  Follow up Education provided;Falls prevention discussed   PHQ 2/9 Scores 12/07/2020 08/02/2019 02/12/2019 07/17/2018 04/11/2018 01/10/2018 09/07/2017  PHQ - 2 Score 4 6 2 3 3 5 5   PHQ- 9 Score 7 19 12 13 16 17 13     Assessment:  Goals Addressed            This Visit's Progress   . Find Help in My Community   On track    Timeframe:  Short-Term Goal Priority:  High Start Date:   12/01/20                          Expected End Date:  01/26/20                     Follow Up Date 01/24/21   - follow-up on any referrals for help I am given    Why is this important?    Knowing how and where to find help for yourself or family in your neighborhood and community is an important skill.   You will want to take some steps to learn how.    Notes:  Wife to discuss home therapy evaluation with patient     . Matintain My Quality of Life   On track    Timeframe:  Long-Range Goal Priority:  Medium Start Date:     12/01/20                        Expected End Date:   01/25/20                    Follow Up Date 01/24/21   - discuss my treatment options with the doctor or nurse  Maximize safety in caring for patient in home, consider physical therapy to help support wife and son in education on safe movement in home.   Why is this important?    Having a long-term illness can be scary.   It can also be stressful for you and your caregiver.   These steps may help.    Notes:        Plan:  Follow-up:  Patient agrees to Care Plan and Follow-up.Wife agreeable to  schedule follow up telephone visit in the next 3  weeks.   01/27/20, RN, BSN  [facility] Care Management,Care Management Coordinator  [phone]- Mobile [phone]- Toll Free Main Office

## 2020-12-28 ENCOUNTER — Telehealth: Payer: Self-pay

## 2020-12-28 DIAGNOSIS — E039 Hypothyroidism, unspecified: Secondary | ICD-10-CM | POA: Diagnosis not present

## 2020-12-28 DIAGNOSIS — F32A Depression, unspecified: Secondary | ICD-10-CM | POA: Diagnosis not present

## 2020-12-28 DIAGNOSIS — N529 Male erectile dysfunction, unspecified: Secondary | ICD-10-CM | POA: Diagnosis not present

## 2020-12-28 DIAGNOSIS — G4733 Obstructive sleep apnea (adult) (pediatric): Secondary | ICD-10-CM | POA: Diagnosis not present

## 2020-12-28 DIAGNOSIS — J9610 Chronic respiratory failure, unspecified whether with hypoxia or hypercapnia: Secondary | ICD-10-CM | POA: Diagnosis not present

## 2020-12-28 DIAGNOSIS — G7111 Myotonic muscular dystrophy: Secondary | ICD-10-CM | POA: Diagnosis not present

## 2020-12-28 DIAGNOSIS — E669 Obesity, unspecified: Secondary | ICD-10-CM | POA: Diagnosis not present

## 2020-12-28 DIAGNOSIS — G4700 Insomnia, unspecified: Secondary | ICD-10-CM | POA: Diagnosis not present

## 2020-12-28 DIAGNOSIS — K5909 Other constipation: Secondary | ICD-10-CM | POA: Diagnosis not present

## 2020-12-28 NOTE — Telephone Encounter (Signed)
Yes this is good. 

## 2020-12-28 NOTE — Telephone Encounter (Signed)
Per PCP, verbal orders provided to [facility] for continuation of PT/OT for Transfer, Home Safety and Ambulation.

## 2020-12-30 NOTE — Telephone Encounter (Signed)
Signed and put in box to go up front. Signed:  Phil Shylie Polo, MD           12/30/2020  

## 2020-12-30 NOTE — Telephone Encounter (Signed)
Received HH orders for signature, PT/OT. Placed on PCP desk to review and sign, if appropriate.

## 2021-01-01 DIAGNOSIS — K5909 Other constipation: Secondary | ICD-10-CM | POA: Diagnosis not present

## 2021-01-01 DIAGNOSIS — G4700 Insomnia, unspecified: Secondary | ICD-10-CM | POA: Diagnosis not present

## 2021-01-01 DIAGNOSIS — E669 Obesity, unspecified: Secondary | ICD-10-CM | POA: Diagnosis not present

## 2021-01-01 DIAGNOSIS — J9610 Chronic respiratory failure, unspecified whether with hypoxia or hypercapnia: Secondary | ICD-10-CM | POA: Diagnosis not present

## 2021-01-01 DIAGNOSIS — G7111 Myotonic muscular dystrophy: Secondary | ICD-10-CM | POA: Diagnosis not present

## 2021-01-01 DIAGNOSIS — G4733 Obstructive sleep apnea (adult) (pediatric): Secondary | ICD-10-CM | POA: Diagnosis not present

## 2021-01-01 DIAGNOSIS — F32A Depression, unspecified: Secondary | ICD-10-CM | POA: Diagnosis not present

## 2021-01-01 DIAGNOSIS — E039 Hypothyroidism, unspecified: Secondary | ICD-10-CM | POA: Diagnosis not present

## 2021-01-01 DIAGNOSIS — N529 Male erectile dysfunction, unspecified: Secondary | ICD-10-CM | POA: Diagnosis not present

## 2021-01-04 DIAGNOSIS — G4700 Insomnia, unspecified: Secondary | ICD-10-CM | POA: Diagnosis not present

## 2021-01-04 DIAGNOSIS — G7111 Myotonic muscular dystrophy: Secondary | ICD-10-CM | POA: Diagnosis not present

## 2021-01-04 DIAGNOSIS — N529 Male erectile dysfunction, unspecified: Secondary | ICD-10-CM | POA: Diagnosis not present

## 2021-01-04 DIAGNOSIS — E669 Obesity, unspecified: Secondary | ICD-10-CM | POA: Diagnosis not present

## 2021-01-04 DIAGNOSIS — K5909 Other constipation: Secondary | ICD-10-CM | POA: Diagnosis not present

## 2021-01-04 DIAGNOSIS — J9610 Chronic respiratory failure, unspecified whether with hypoxia or hypercapnia: Secondary | ICD-10-CM | POA: Diagnosis not present

## 2021-01-04 DIAGNOSIS — F32A Depression, unspecified: Secondary | ICD-10-CM | POA: Diagnosis not present

## 2021-01-04 DIAGNOSIS — E039 Hypothyroidism, unspecified: Secondary | ICD-10-CM | POA: Diagnosis not present

## 2021-01-04 DIAGNOSIS — G4733 Obstructive sleep apnea (adult) (pediatric): Secondary | ICD-10-CM | POA: Diagnosis not present

## 2021-01-05 DIAGNOSIS — E669 Obesity, unspecified: Secondary | ICD-10-CM | POA: Diagnosis not present

## 2021-01-05 DIAGNOSIS — G7111 Myotonic muscular dystrophy: Secondary | ICD-10-CM | POA: Diagnosis not present

## 2021-01-05 DIAGNOSIS — G4700 Insomnia, unspecified: Secondary | ICD-10-CM | POA: Diagnosis not present

## 2021-01-05 DIAGNOSIS — F32A Depression, unspecified: Secondary | ICD-10-CM | POA: Diagnosis not present

## 2021-01-05 DIAGNOSIS — E039 Hypothyroidism, unspecified: Secondary | ICD-10-CM | POA: Diagnosis not present

## 2021-01-05 DIAGNOSIS — N529 Male erectile dysfunction, unspecified: Secondary | ICD-10-CM | POA: Diagnosis not present

## 2021-01-05 DIAGNOSIS — G4733 Obstructive sleep apnea (adult) (pediatric): Secondary | ICD-10-CM | POA: Diagnosis not present

## 2021-01-05 DIAGNOSIS — K5909 Other constipation: Secondary | ICD-10-CM | POA: Diagnosis not present

## 2021-01-05 DIAGNOSIS — J9610 Chronic respiratory failure, unspecified whether with hypoxia or hypercapnia: Secondary | ICD-10-CM | POA: Diagnosis not present

## 2021-01-07 DIAGNOSIS — N529 Male erectile dysfunction, unspecified: Secondary | ICD-10-CM | POA: Diagnosis not present

## 2021-01-07 DIAGNOSIS — J9610 Chronic respiratory failure, unspecified whether with hypoxia or hypercapnia: Secondary | ICD-10-CM | POA: Diagnosis not present

## 2021-01-07 DIAGNOSIS — E669 Obesity, unspecified: Secondary | ICD-10-CM | POA: Diagnosis not present

## 2021-01-07 DIAGNOSIS — G7111 Myotonic muscular dystrophy: Secondary | ICD-10-CM | POA: Diagnosis not present

## 2021-01-07 DIAGNOSIS — E039 Hypothyroidism, unspecified: Secondary | ICD-10-CM | POA: Diagnosis not present

## 2021-01-07 DIAGNOSIS — F32A Depression, unspecified: Secondary | ICD-10-CM | POA: Diagnosis not present

## 2021-01-07 DIAGNOSIS — G4700 Insomnia, unspecified: Secondary | ICD-10-CM | POA: Diagnosis not present

## 2021-01-07 DIAGNOSIS — K5909 Other constipation: Secondary | ICD-10-CM | POA: Diagnosis not present

## 2021-01-07 DIAGNOSIS — G4733 Obstructive sleep apnea (adult) (pediatric): Secondary | ICD-10-CM | POA: Diagnosis not present

## 2021-01-13 ENCOUNTER — Other Ambulatory Visit: Payer: Self-pay | Admitting: *Deleted

## 2021-01-13 NOTE — Patient Outreach (Signed)
[facility]) Care Management  01/13/2021  [name] 10-Nov-1961 [id number]    Telephone Assessment   Referral Date :11/16/20 Referral source: PCP office  Referral reason : Social worker services, son unable to work due to caring for patient , Are there financial services available to help family?  Insurance : [place]  Patient is 60 year old male with history of Myotonic muscular dystrophy, wheelchair bound, hypothyroidism.     Subjective: Successful outreach call to patient wife Landrum Carbonell. She discussed being encouraged regarding patient progress with home health physical and occupational therapy. She states services have been very helpful to show safer ways for her on son to do things. She hopeful for occupational therapy to provide exercises/to help with use of hands she plans to discuss benefit of different type of walker.   She expressed appreciation of support from  [facility] .   Goals Addressed            This Visit's Progress    Find Help in My Community   On track    Timeframe:  Short-Term Goal Priority:  High Start Date:   12/01/20                          Expected End Date:  02/22/21                  Follow Up Date 02/11/21   - follow-up on any referrals for help I am given    Why is this important?    Knowing how and where to find help for yourself or family in your neighborhood and community is an important skill.   You will want to take some steps to learn how.    Notes:  Patient participating in home physical and occupational therapy     Matintain My Quality of Life   On track    Timeframe:  Long-Range Goal Priority:  Medium Start Date:     12/01/20                        Expected End Date:   02/22/21                   Follow Up Date 02/11/21   - discuss my treatment options with the doctor or nurse  Maximize safety in caring for patient in home, consider physical therapy to help support wife and son in education on  safe movement in home.   Why is this important?    Having a long-term illness can be scary.   It can also be stressful for you and your caregiver.   These steps may help.    Notes: Wife to follow up [facility] , regarding studies/clinical for myotonic  dystrophy as patient at later date. Wife considering discussion with patient on palliative care referral        Plan Wife agreeable with care plan and follow up, will schedule follow up call in the next month. Encouraged to call sooner if questions and new concerns.    [name], RN, BSN  [facility] Management Coordinator  [phone]- Mobile [phone]- Toll Free Main Office

## 2021-01-14 DIAGNOSIS — E039 Hypothyroidism, unspecified: Secondary | ICD-10-CM | POA: Diagnosis not present

## 2021-01-14 DIAGNOSIS — E669 Obesity, unspecified: Secondary | ICD-10-CM | POA: Diagnosis not present

## 2021-01-14 DIAGNOSIS — K5909 Other constipation: Secondary | ICD-10-CM | POA: Diagnosis not present

## 2021-01-14 DIAGNOSIS — G7111 Myotonic muscular dystrophy: Secondary | ICD-10-CM | POA: Diagnosis not present

## 2021-01-14 DIAGNOSIS — J9610 Chronic respiratory failure, unspecified whether with hypoxia or hypercapnia: Secondary | ICD-10-CM | POA: Diagnosis not present

## 2021-01-14 DIAGNOSIS — N529 Male erectile dysfunction, unspecified: Secondary | ICD-10-CM | POA: Diagnosis not present

## 2021-01-14 DIAGNOSIS — F32A Depression, unspecified: Secondary | ICD-10-CM | POA: Diagnosis not present

## 2021-01-14 DIAGNOSIS — G4700 Insomnia, unspecified: Secondary | ICD-10-CM | POA: Diagnosis not present

## 2021-01-14 DIAGNOSIS — G4733 Obstructive sleep apnea (adult) (pediatric): Secondary | ICD-10-CM | POA: Diagnosis not present

## 2021-01-18 ENCOUNTER — Other Ambulatory Visit: Payer: Self-pay | Admitting: Family Medicine

## 2021-01-19 ENCOUNTER — Other Ambulatory Visit: Payer: Self-pay | Admitting: Family Medicine

## 2021-01-19 DIAGNOSIS — N529 Male erectile dysfunction, unspecified: Secondary | ICD-10-CM | POA: Diagnosis not present

## 2021-01-19 DIAGNOSIS — K5909 Other constipation: Secondary | ICD-10-CM | POA: Diagnosis not present

## 2021-01-19 DIAGNOSIS — G7111 Myotonic muscular dystrophy: Secondary | ICD-10-CM | POA: Diagnosis not present

## 2021-01-19 DIAGNOSIS — G4700 Insomnia, unspecified: Secondary | ICD-10-CM | POA: Diagnosis not present

## 2021-01-19 DIAGNOSIS — G4733 Obstructive sleep apnea (adult) (pediatric): Secondary | ICD-10-CM | POA: Diagnosis not present

## 2021-01-19 DIAGNOSIS — E039 Hypothyroidism, unspecified: Secondary | ICD-10-CM | POA: Diagnosis not present

## 2021-01-19 DIAGNOSIS — J9610 Chronic respiratory failure, unspecified whether with hypoxia or hypercapnia: Secondary | ICD-10-CM | POA: Diagnosis not present

## 2021-01-19 DIAGNOSIS — F32A Depression, unspecified: Secondary | ICD-10-CM | POA: Diagnosis not present

## 2021-01-19 DIAGNOSIS — E669 Obesity, unspecified: Secondary | ICD-10-CM | POA: Diagnosis not present

## 2021-01-19 NOTE — Telephone Encounter (Signed)
Requesting: adderall  Contract: 11/30/18 UDS: n/a Last Visit:10/02/20 Next Visit:01/26/21 Last Refill: 11/11/20(30,0)  Please Advise. Medication pending

## 2021-01-19 NOTE — Telephone Encounter (Signed)
Pt's wife, [name] advised refills sent. Okay per [facility]

## 2021-01-20 DIAGNOSIS — E039 Hypothyroidism, unspecified: Secondary | ICD-10-CM | POA: Diagnosis not present

## 2021-01-20 DIAGNOSIS — K5909 Other constipation: Secondary | ICD-10-CM | POA: Diagnosis not present

## 2021-01-20 DIAGNOSIS — F32A Depression, unspecified: Secondary | ICD-10-CM | POA: Diagnosis not present

## 2021-01-20 DIAGNOSIS — G7111 Myotonic muscular dystrophy: Secondary | ICD-10-CM | POA: Diagnosis not present

## 2021-01-20 DIAGNOSIS — E669 Obesity, unspecified: Secondary | ICD-10-CM | POA: Diagnosis not present

## 2021-01-20 DIAGNOSIS — N529 Male erectile dysfunction, unspecified: Secondary | ICD-10-CM | POA: Diagnosis not present

## 2021-01-20 DIAGNOSIS — J9610 Chronic respiratory failure, unspecified whether with hypoxia or hypercapnia: Secondary | ICD-10-CM | POA: Diagnosis not present

## 2021-01-20 DIAGNOSIS — G4700 Insomnia, unspecified: Secondary | ICD-10-CM | POA: Diagnosis not present

## 2021-01-20 DIAGNOSIS — G4733 Obstructive sleep apnea (adult) (pediatric): Secondary | ICD-10-CM | POA: Diagnosis not present

## 2021-01-22 DIAGNOSIS — K5909 Other constipation: Secondary | ICD-10-CM | POA: Diagnosis not present

## 2021-01-22 DIAGNOSIS — F32A Depression, unspecified: Secondary | ICD-10-CM | POA: Diagnosis not present

## 2021-01-22 DIAGNOSIS — E039 Hypothyroidism, unspecified: Secondary | ICD-10-CM | POA: Diagnosis not present

## 2021-01-22 DIAGNOSIS — N529 Male erectile dysfunction, unspecified: Secondary | ICD-10-CM | POA: Diagnosis not present

## 2021-01-22 DIAGNOSIS — J9610 Chronic respiratory failure, unspecified whether with hypoxia or hypercapnia: Secondary | ICD-10-CM | POA: Diagnosis not present

## 2021-01-22 DIAGNOSIS — E669 Obesity, unspecified: Secondary | ICD-10-CM | POA: Diagnosis not present

## 2021-01-22 DIAGNOSIS — G4733 Obstructive sleep apnea (adult) (pediatric): Secondary | ICD-10-CM | POA: Diagnosis not present

## 2021-01-22 DIAGNOSIS — G4700 Insomnia, unspecified: Secondary | ICD-10-CM | POA: Diagnosis not present

## 2021-01-22 DIAGNOSIS — G7111 Myotonic muscular dystrophy: Secondary | ICD-10-CM | POA: Diagnosis not present

## 2021-01-25 DIAGNOSIS — E039 Hypothyroidism, unspecified: Secondary | ICD-10-CM | POA: Diagnosis not present

## 2021-01-25 DIAGNOSIS — J9610 Chronic respiratory failure, unspecified whether with hypoxia or hypercapnia: Secondary | ICD-10-CM | POA: Diagnosis not present

## 2021-01-25 DIAGNOSIS — E669 Obesity, unspecified: Secondary | ICD-10-CM | POA: Diagnosis not present

## 2021-01-25 DIAGNOSIS — G4733 Obstructive sleep apnea (adult) (pediatric): Secondary | ICD-10-CM | POA: Diagnosis not present

## 2021-01-25 DIAGNOSIS — F32A Depression, unspecified: Secondary | ICD-10-CM | POA: Diagnosis not present

## 2021-01-25 DIAGNOSIS — G7111 Myotonic muscular dystrophy: Secondary | ICD-10-CM | POA: Diagnosis not present

## 2021-01-25 DIAGNOSIS — K5909 Other constipation: Secondary | ICD-10-CM | POA: Diagnosis not present

## 2021-01-25 DIAGNOSIS — G4700 Insomnia, unspecified: Secondary | ICD-10-CM | POA: Diagnosis not present

## 2021-01-25 DIAGNOSIS — N529 Male erectile dysfunction, unspecified: Secondary | ICD-10-CM | POA: Diagnosis not present

## 2021-01-26 ENCOUNTER — Ambulatory Visit: Payer: 59 | Admitting: Family Medicine

## 2021-01-26 ENCOUNTER — Other Ambulatory Visit: Payer: Self-pay

## 2021-01-26 ENCOUNTER — Encounter: Payer: Self-pay | Admitting: Family Medicine

## 2021-01-26 VITALS — BP 127/86 | HR 93 | Temp 98.0°F | Resp 16 | Ht 64.0 in | Wt 227.0 lb

## 2021-01-26 DIAGNOSIS — R5382 Chronic fatigue, unspecified: Secondary | ICD-10-CM

## 2021-01-26 DIAGNOSIS — E890 Postprocedural hypothyroidism: Secondary | ICD-10-CM

## 2021-01-26 DIAGNOSIS — Z125 Encounter for screening for malignant neoplasm of prostate: Secondary | ICD-10-CM

## 2021-01-26 DIAGNOSIS — R5381 Other malaise: Secondary | ICD-10-CM

## 2021-01-26 DIAGNOSIS — Z79899 Other long term (current) drug therapy: Secondary | ICD-10-CM

## 2021-01-26 DIAGNOSIS — E7800 Pure hypercholesterolemia, unspecified: Secondary | ICD-10-CM

## 2021-01-26 DIAGNOSIS — Z0000 Encounter for general adult medical examination without abnormal findings: Secondary | ICD-10-CM

## 2021-01-26 DIAGNOSIS — Z1211 Encounter for screening for malignant neoplasm of colon: Secondary | ICD-10-CM

## 2021-01-26 DIAGNOSIS — F988 Other specified behavioral and emotional disorders with onset usually occurring in childhood and adolescence: Secondary | ICD-10-CM | POA: Diagnosis not present

## 2021-01-26 LAB — COMPREHENSIVE METABOLIC PANEL
ALT: 20 U/L (ref 0–53)
AST: 21 U/L (ref 0–37)
Albumin: 3.8 g/dL (ref 3.5–5.2)
Alkaline Phosphatase: 66 U/L (ref 39–117)
BUN: 18 mg/dL (ref 6–23)
CO2: 35 mEq/L — ABNORMAL HIGH (ref 19–32)
Calcium: 10 mg/dL (ref 8.4–10.5)
Chloride: 102 mEq/L (ref 96–112)
Creatinine, Ser: 0.75 mg/dL (ref 0.40–1.50)
GFR: 98.25 mL/min (ref >60.00)
Glucose, Bld: 93 mg/dL (ref 70–99)
Potassium: 4.4 mEq/L (ref 3.5–5.1)
Sodium: 141 mEq/L (ref 135–145)
Total Bilirubin: 0.4 mg/dL (ref 0.2–1.2)
Total Protein: 6.8 g/dL (ref 6.0–8.3)

## 2021-01-26 LAB — CBC WITH DIFFERENTIAL/PLATELET
Basophils Absolute: 0 10*3/uL (ref 0.0–0.1)
Basophils Relative: 0.6 % (ref 0.0–3.0)
Eosinophils Absolute: 0.2 10*3/uL (ref 0.0–0.7)
Eosinophils Relative: 3.5 % (ref 0.0–5.0)
HCT: 42.6 % (ref 39.0–52.0)
Hemoglobin: 13.9 g/dL (ref 13.0–17.0)
Lymphocytes Relative: 19.1 % (ref 12.0–46.0)
Lymphs Abs: 0.8 10*3/uL (ref 0.7–4.0)
MCHC: 32.7 g/dL (ref 30.0–36.0)
MCV: 94.8 fl (ref 78.0–100.0)
Monocytes Absolute: 0.3 10*3/uL (ref 0.1–1.0)
Monocytes Relative: 7.9 % (ref 3.0–12.0)
Neutro Abs: 3 10*3/uL (ref 1.4–7.7)
Neutrophils Relative %: 68.9 % (ref 43.0–77.0)
Platelets: 196 10*3/uL (ref 150.0–400.0)
RBC: 4.5 Mil/uL (ref 4.22–5.81)
RDW: 17.1 % — ABNORMAL HIGH (ref 11.5–15.5)
WBC: 4.4 10*3/uL (ref 4.0–10.5)

## 2021-01-26 LAB — TSH: TSH: 9.21 u[IU]/mL — ABNORMAL HIGH (ref 0.35–4.50)

## 2021-01-26 LAB — LIPID PANEL
Cholesterol: 262 mg/dL — ABNORMAL HIGH (ref 0–200)
HDL: 85.2 mg/dL (ref >39.00)
LDL Cholesterol: 148 mg/dL — ABNORMAL HIGH (ref 0–99)
NonHDL: 176.39
Total CHOL/HDL Ratio: 3
Triglycerides: 142 mg/dL (ref 0.0–149.0)
VLDL: 28.4 mg/dL (ref 0.0–40.0)

## 2021-01-26 NOTE — Progress Notes (Signed)
Office Note 01/26/2021  CC:  Chief Complaint  Patient presents with  . Annual Exam    Pt is fasting    HPI:  [name] is a 60 y.o. White male with myotonic dystrophy who is here for annual health maintenance exam and 4 mo f/u adult ADD, MDD, and hypothyroidism. A/P as of last visit: "1) Chronic fatigue/poor concentration and focus: multifactorial (progressive myotonic dystrophy, adult add, osa untreated): continue adderall 30mg  xr + 10mg  adderall xr qd.  2) Myotonic dystrophy: debilitation has progressed quite a bit over the last year or so. He cannot walk.  Cannot self propel a manual WC. He may be able to operate an electric WC so we'll need to do a mobility exam soon.  3) OSA: unfortunately he could not tolerate CPAP trial.  4) Hypothyroidism: stable TSH 4 mo ago.  Plan recheck at annual intervals.  5) Insomnia: unfortunately he sleeps a lot in daytime. Uses trazodone 150mg  hs and this helps so we'll continue this."  INTERIM HX: Doing pretty well.  Wife with him today.  Resp: room air, no SOB, could not tolerate CPAP. Takes trazodone 150mg  dose qhs and helpful for sleep + antidepressant effect.  Also on fluox 40mg  qd. Adderall helpful to maintain some focus and level of alertness/decrease his mental fog. Taking T4 as rx'd, on empty stomach.  Maintaining strength fairly well last couple months. HH PT/OT: isometric exercises being done, ROM/joint mobility, using walker. Has hosp bed, bedside commode, shower chair, self-propel WC (which he cannot manipulate due to chronic weakness).    PMP AWARE reviewed today: most recent rx for adderall xr 10mg  and 30mg  caps was filled 01/19/21, # 30 of each, rx by me. No red flags.   Past Medical History:  Diagnosis Date  . Adult ADHD   . Aspiration pneumonitis ([place]) 04/2017, 08/2017   Recurrent  . Chronic respiratory failure ([place])    As of 04/2018, Dr. Chase Caller suspects hypercapnic RF--but [name] normal.  May eventually  need nocturnal noninvasive ventilation.  Ultimately, if progresses enough pt will need trach.  . Constipation, chronic    Has caused irritative urinary/bladder symptoms in the past.  . Depression   . Dizziness 06/2011+   Noncontrast CT and noncontrast MRI both negative 06/2011 at [facility] ED visit.  . Elevated transaminase level    Liver biopsy revealed fatty liver  . Erectile dysfunction   . Facial fracture ([place]) 06/2011   Nondisplaced bilateral [place] I fracture: no surgery required. (ENT, Dr. Wilburn Cornelia)   . Fatty liver disease, nonalcoholic   . GERD (gastroesophageal reflux disease)   . History of thyroid cancer 2010   Medullary carcinoma of thyroid. Thyroidectomy 08/2009 (Dr. Aida Puffer at [facility]).  Needs periodic serum calcitonin monitoring.  Marland Kitchen Hyperlipidemia   . Hypothyroidism   . LVH (left ventricular hypertrophy)    by EKG criteria 06/2011.  Hx of heart murmur--Followed by cardiology at [facility]: EF's have been "stable" per cardio office note 12/29/10.  His echo 12/2012 showed normal LV size and wall thickness but EF 45-50% and global LV hypokinesis was found.  . Myotonic dystrophy ([place]) Dx'd 1996   Progressive muscle weakness and swallowing dysfunction.  Initial dx by Dr. Jannifer Franklin, neurologist, but subsequent annual specialist f/u has been with Dr. Ladon Applebaum [facility].  Progressive debilitation--PT referral 04/2017, pt quit PT 05/2017.  . Nephrolithiasis   . Obesity, Class I, BMI 30-34.9   . OSA (obstructive sleep apnea)    SEVERE 08/2019 sleep study). CPAP to  be initiated as of 04/18/20 pulm (Dr. Annamaria Boots)  . Pulmonary nodule, right 05/2017   RUL, 7 mm sub solid nodule.  Resolution noted on f/u CT 08/2017.    Past Surgical History:  Procedure Laterality Date  . COLONOSCOPY  09/13/2007   normal (Dr. Deatra Ina).  . EXTRACORPOREAL SHOCK WAVE LITHOTRIPSY    . Home sleep study  08/31/2019   Severe OSA  . THYROIDECTOMY  08/2009   for medullary thyroid cancer (Dr. Conley Canal at [facility] follows him for this).   . TRANSTHORACIC ECHOCARDIOGRAM  12/2012; 09/2017   2014-Normal LV size and wall thickness, LV EF 45-50%, with global hypokinesis of LV.  2018- EF 50-55%, normal wall motion, grd I DD, valves normal.    Family History  Problem Relation Age of Onset  . Arthritis Mother   . Hypertension Mother   . Alcohol abuse Father   . Arthritis Father   . Hypertension Father   . Colon cancer Father   . Prostate cancer Father   . Heart disease Brother     Social History   Socioeconomic History  . Marital status: Married    Spouse name: Kelby Adell  . Number of children: 1  . Years of education: College  . Highest education level: Bachelor's degree (e.g., BA, AB, BS)  Occupational History  . Occupation: Disabled  Tobacco Use  . Smoking status: Never Smoker  . Smokeless tobacco: Never Used  Vaping Use  . Vaping Use: Never used  Substance and Sexual Activity  . Alcohol use: Yes    Comment: rare beer  . Drug use: No  . Sexual activity: Not Currently  Other Topics Concern  . Not on file  Social History Narrative   Married, one son [name]).   Occupation: Market researcher, currently unemployed, working on appeal for medical disability as of 11/2011.  No tob or drugs.  Drinks alcohol socially.   Social Determinants of Health   Financial Resource Strain: Low Risk   . Difficulty of Paying Living Expenses: Not hard at all  Food Insecurity: No Food Insecurity  . Worried About Charity fundraiser in the Last Year: Never true  . Ran Out of Food in the Last Year: Never true  Transportation Needs: No Transportation Needs  . Lack of Transportation (Medical): No  . Lack of Transportation (Non-Medical): No  Physical Activity: Inactive  . Days of Exercise per Week: 0 days  . Minutes of Exercise per Session: 0 min  Stress: Stress Concern Present  . Feeling of Stress : Rather much  Social Connections: Moderately Integrated  . Frequency of Communication with Friends and Family: More than  three times a week  . Frequency of Social Gatherings with Friends and Family: More than three times a week  . Attends Religious Services: More than 4 times per year  . Active Member of Clubs or Organizations: No  . Attends Archivist Meetings: Never  . Marital Status: Married  Human resources officer Violence: Not At Risk  . Fear of Current or Ex-Partner: No  . Emotionally Abused: No  . Physically Abused: No  . Sexually Abused: No    Outpatient Medications Prior to Visit  Medication Sig Dispense Refill  . acetaminophen (TYLENOL) 325 MG tablet Take 1-2 tablets (325-650 mg total) by mouth every 4 (four) hours as needed for mild pain.    . ADDERALL XR 30 MG 24 hr capsule TAKE 1 CAPSULE BY MOUTH DAILY. 30 capsule 0  . amphetamine-dextroamphetamine (ADDERALL XR) 10 MG  24 hr capsule TAKE 1 CAPSULE BY MOUTH EVERY MORNING (TO BE TAKEN WITH 30MG  XR CAPSULES) 30 capsule 0  . clotrimazole-betamethasone (LOTRISONE) cream APPLY TO THE AFFECTED AREA(S) TWICE DAILY AS NEEDED 45 g 1  . FLUoxetine (PROZAC) 40 MG capsule TAKE 1 CAPSULE BY MOUTH ONCE DAILY 90 capsule 0  . glycopyrrolate (ROBINUL) 2 MG tablet TAKE 1/2 TO 1 TABLET BY MOUTH TWO TIMES DAILY AS NEEDED FOR EXCESSIVE ORAL SECRETIONS 60 tablet 6  . levothyroxine (SYNTHROID) 150 MCG tablet TAKE 1 TABLET BY MOUTH ONCE DAILY EXCEPT FOR SUNDAY SKIP DOSE 30 tablet 2  . omeprazole (PRILOSEC) 40 MG capsule TAKE 1 CAPSULE BY MOUTH ONCE DAILY 90 capsule 2  . traZODone (DESYREL) 100 MG tablet TAKE 1.5 TABLETS BY MOUTH AT BEDTIME AS NEEDED FOR SLEEP. 135 tablet 3  . ondansetron (ZOFRAN) 8 MG tablet Take 1 tablet (8 mg total) by mouth every 8 (eight) hours as needed for nausea or vomiting. (Patient not taking: Reported on 01/26/2021) 20 tablet 1  . Simethicone 125 MG TABS Take 125 mg by mouth every 6 (six) hours as needed for flatulence. (Patient not taking: Reported on 01/26/2021)    . penicillin v potassium (VEETID) 500 MG tablet Take 500 mg by mouth 4 (four)  times daily.     No facility-administered medications prior to visit.    No Known Allergies  ROS Review of Systems  Constitutional: Positive for fatigue (chronic). Negative for appetite change, chills and fever.  HENT: Negative for congestion, dental problem, ear pain and sore throat.   Eyes: Negative for discharge, redness and visual disturbance.  Respiratory: Negative for cough, chest tightness, shortness of breath and wheezing.   Cardiovascular: Negative for chest pain, palpitations and leg swelling.  Gastrointestinal: Negative for abdominal pain, blood in stool, diarrhea, nausea and vomiting.  Genitourinary: Negative for difficulty urinating, dysuria, flank pain, frequency, hematuria and urgency.  Musculoskeletal: Negative for arthralgias, back pain, joint swelling, myalgias and neck stiffness.  Skin: Negative for pallor and rash.  Neurological: Negative for dizziness, speech difficulty, weakness and headaches.  Hematological: Negative for adenopathy. Does not bruise/bleed easily.  Psychiatric/Behavioral: Negative for confusion and sleep disturbance. The patient is not nervous/anxious.     PE; Vitals with BMI 01/26/2021 07/17/2020 07/13/2020  Height 5\' 4"  5\' 4"  -  Weight 227 lbs 222 lbs -  BMI 95.62 13.08 -  Systolic 657 846 962  Diastolic 86 77 67  Pulse 93 100 86  O2 sat on RA today is 94%  Gen: Alert, well appearing, sitting in WC.  Patient is oriented to person, place, time, and situation. AFFECT: pleasant, lucid thought and speech. ENT: Ears: EACs clear, normal epithelium.  TMs with good light reflex and landmarks bilaterally.  Eyes: no injection, icteris, swelling, or exudate.  EOMI, PERRLA. Nose: no drainage or turbinate edema/swelling.  No injection or focal lesion.  Mouth: lips without lesion/swelling.  Oral mucosa pink and moist.  Dentition intact and without obvious caries or gingival swelling.  Oropharynx without erythema, exudate, or swelling.  Neck:  supple/nontender.  No LAD, mass, or TM.  Carotid pulses 2+ bilaterally, without bruits. CV: RRR, no m/r/g.   LUNGS: CTA bilat, nonlabored resps, good aeration in all lung fields. ABD: soft, NT, ND, BS normal.  No hepatospenomegaly or mass.  No bruits. EXT: no clubbing, cyanosis, or edema.  Musculoskeletal: no joint swelling, erythema, warmth, or tenderness.  ROM of all joints intact. Skin - no sores or suspicious lesions or rashes or color  changes Neuro: CN 2-12 intact bilaterally, strength 5-/5 in proximal and distal upper extremities and lower extremities bilaterally.  No sensory deficits.  No tremor.  I did not have pt stand or walk.     Pertinent labs:  Lab Results  Component Value Date   TSH 1.01 06/16/2020   Lab Results  Component Value Date   WBC 6.1 08/02/2019   HGB 14.0 08/02/2019   HCT 43.3 08/02/2019   MCV 93.8 08/02/2019   PLT 232.0 08/02/2019   Lab Results  Component Value Date   CREATININE 0.80 08/02/2019   BUN 14 08/02/2019   NA 137 08/02/2019   K 4.2 08/02/2019   CL 96 08/02/2019   CO2 29 08/02/2019   Lab Results  Component Value Date   ALT 15 08/02/2019   AST 20 08/02/2019   ALKPHOS 84 08/02/2019   BILITOT 0.4 08/02/2019   Lab Results  Component Value Date   CHOL 233 (H) 07/17/2018   Lab Results  Component Value Date   HDL 72.00 07/17/2018   Lab Results  Component Value Date   LDLCALC 138 (H) 07/17/2018   Lab Results  Component Value Date   TRIG 117.0 07/17/2018   Lab Results  Component Value Date   CHOLHDL 3 07/17/2018   Lab Results  Component Value Date   PSA 0.35 08/02/2019   PSA 0.38 07/17/2018   PSA 0.57 08/15/2012   ASSESSMENT AND PLAN:   1) Adult ADD/chronic fatigue and somnolence: doing fine/getting decent benefit from daily adderall xr 30mg  + 10mg  xr qAM. CSC renewed today.  No new rx for this med was needed today.  2) Recurrent MDD: doing fairly well on fluox 40mg  qd.  3) Insomnia: trazodone 100mg , 1.5 tabs qhs  helpful.  4) Hypothyroidism: TSH monitoring today.  5) Myotonic dystrophy: stable from resp standpoint. Generalized weakness and debilitation stable, has all approp HH devices and benefiting from PT/OT.  6) Health maintenance exam: Reviewed age and gender appropriate health maintenance issues (prudent diet, regular exercise, health risks of tobacco and excessive alcohol, use of seatbelts, fire alarms in home, use of sunscreen).  Also reviewed age and gender appropriate health screening as well as vaccine recommendations. Vaccines: Flu->UTD.  Otherwise ALL UTD. Labs: FLP, CBC, CMET, TSH. Prostate ca screening: discussed pros/cons of further prostate ca screening today->DECIDED TO DO NO FURTHER PROSTATE CA SCREENING. Colon ca screening: discussed pros/cons of further colon ca screening today->DECIDED TO DO NO FURTHER COLON CA SCREENING.  An After Visit Summary was printed and given to the patient.  FOLLOW UP:  Return in about 3 months (around 04/25/2021) for routine chronic illness f/u.  Signed:  Crissie Sickles, MD           01/26/2021

## 2021-01-27 ENCOUNTER — Other Ambulatory Visit: Payer: Self-pay | Admitting: Family Medicine

## 2021-01-27 ENCOUNTER — Telehealth: Payer: Self-pay | Admitting: Family Medicine

## 2021-01-27 DIAGNOSIS — K5909 Other constipation: Secondary | ICD-10-CM | POA: Diagnosis not present

## 2021-01-27 DIAGNOSIS — E669 Obesity, unspecified: Secondary | ICD-10-CM | POA: Diagnosis not present

## 2021-01-27 DIAGNOSIS — J9610 Chronic respiratory failure, unspecified whether with hypoxia or hypercapnia: Secondary | ICD-10-CM | POA: Diagnosis not present

## 2021-01-27 DIAGNOSIS — G7111 Myotonic muscular dystrophy: Secondary | ICD-10-CM | POA: Diagnosis not present

## 2021-01-27 DIAGNOSIS — G4700 Insomnia, unspecified: Secondary | ICD-10-CM | POA: Diagnosis not present

## 2021-01-27 DIAGNOSIS — N529 Male erectile dysfunction, unspecified: Secondary | ICD-10-CM | POA: Diagnosis not present

## 2021-01-27 DIAGNOSIS — F32A Depression, unspecified: Secondary | ICD-10-CM | POA: Diagnosis not present

## 2021-01-27 DIAGNOSIS — G4733 Obstructive sleep apnea (adult) (pediatric): Secondary | ICD-10-CM | POA: Diagnosis not present

## 2021-01-27 DIAGNOSIS — E039 Hypothyroidism, unspecified: Secondary | ICD-10-CM | POA: Diagnosis not present

## 2021-01-27 NOTE — Telephone Encounter (Signed)
Received faxed orders from [place] at [facility]. Placed in Dr. Idelle Leech inbox in front office to be signed.

## 2021-01-28 ENCOUNTER — Other Ambulatory Visit: Payer: Self-pay | Admitting: Family Medicine

## 2021-01-28 NOTE — Telephone Encounter (Signed)
Signed and put in box to go up front. Signed:  Crissie Sickles, MD           01/28/2021

## 2021-01-28 NOTE — Telephone Encounter (Signed)
OT continuation for once weekly for 4 weeks. Placed on PCP desk to review and sign, if appropriate.

## 2021-01-29 NOTE — Telephone Encounter (Signed)
Faxed orders to [place] at [facility]. Fax confirmation received. Sent for scanning.

## 2021-02-03 DIAGNOSIS — J9610 Chronic respiratory failure, unspecified whether with hypoxia or hypercapnia: Secondary | ICD-10-CM | POA: Diagnosis not present

## 2021-02-03 DIAGNOSIS — G7111 Myotonic muscular dystrophy: Secondary | ICD-10-CM | POA: Diagnosis not present

## 2021-02-03 DIAGNOSIS — E669 Obesity, unspecified: Secondary | ICD-10-CM | POA: Diagnosis not present

## 2021-02-03 DIAGNOSIS — E039 Hypothyroidism, unspecified: Secondary | ICD-10-CM | POA: Diagnosis not present

## 2021-02-03 DIAGNOSIS — G4733 Obstructive sleep apnea (adult) (pediatric): Secondary | ICD-10-CM | POA: Diagnosis not present

## 2021-02-03 DIAGNOSIS — N529 Male erectile dysfunction, unspecified: Secondary | ICD-10-CM | POA: Diagnosis not present

## 2021-02-03 DIAGNOSIS — F32A Depression, unspecified: Secondary | ICD-10-CM | POA: Diagnosis not present

## 2021-02-03 DIAGNOSIS — G4700 Insomnia, unspecified: Secondary | ICD-10-CM | POA: Diagnosis not present

## 2021-02-03 DIAGNOSIS — K5909 Other constipation: Secondary | ICD-10-CM | POA: Diagnosis not present

## 2021-02-05 DIAGNOSIS — E039 Hypothyroidism, unspecified: Secondary | ICD-10-CM | POA: Diagnosis not present

## 2021-02-05 DIAGNOSIS — G7111 Myotonic muscular dystrophy: Secondary | ICD-10-CM | POA: Diagnosis not present

## 2021-02-05 DIAGNOSIS — G4733 Obstructive sleep apnea (adult) (pediatric): Secondary | ICD-10-CM | POA: Diagnosis not present

## 2021-02-05 DIAGNOSIS — G4700 Insomnia, unspecified: Secondary | ICD-10-CM | POA: Diagnosis not present

## 2021-02-05 DIAGNOSIS — E669 Obesity, unspecified: Secondary | ICD-10-CM | POA: Diagnosis not present

## 2021-02-05 DIAGNOSIS — J9610 Chronic respiratory failure, unspecified whether with hypoxia or hypercapnia: Secondary | ICD-10-CM | POA: Diagnosis not present

## 2021-02-05 DIAGNOSIS — F32A Depression, unspecified: Secondary | ICD-10-CM | POA: Diagnosis not present

## 2021-02-05 DIAGNOSIS — N529 Male erectile dysfunction, unspecified: Secondary | ICD-10-CM | POA: Diagnosis not present

## 2021-02-05 DIAGNOSIS — K5909 Other constipation: Secondary | ICD-10-CM | POA: Diagnosis not present

## 2021-02-08 DIAGNOSIS — J9610 Chronic respiratory failure, unspecified whether with hypoxia or hypercapnia: Secondary | ICD-10-CM | POA: Diagnosis not present

## 2021-02-08 DIAGNOSIS — E669 Obesity, unspecified: Secondary | ICD-10-CM | POA: Diagnosis not present

## 2021-02-08 DIAGNOSIS — F32A Depression, unspecified: Secondary | ICD-10-CM | POA: Diagnosis not present

## 2021-02-08 DIAGNOSIS — N529 Male erectile dysfunction, unspecified: Secondary | ICD-10-CM | POA: Diagnosis not present

## 2021-02-08 DIAGNOSIS — E039 Hypothyroidism, unspecified: Secondary | ICD-10-CM | POA: Diagnosis not present

## 2021-02-08 DIAGNOSIS — G7111 Myotonic muscular dystrophy: Secondary | ICD-10-CM | POA: Diagnosis not present

## 2021-02-08 DIAGNOSIS — G4733 Obstructive sleep apnea (adult) (pediatric): Secondary | ICD-10-CM | POA: Diagnosis not present

## 2021-02-08 DIAGNOSIS — K5909 Other constipation: Secondary | ICD-10-CM | POA: Diagnosis not present

## 2021-02-08 DIAGNOSIS — G4700 Insomnia, unspecified: Secondary | ICD-10-CM | POA: Diagnosis not present

## 2021-02-10 ENCOUNTER — Ambulatory Visit: Payer: Self-pay | Admitting: *Deleted

## 2021-02-11 ENCOUNTER — Telehealth: Payer: Self-pay | Admitting: Family Medicine

## 2021-02-11 NOTE — Telephone Encounter (Signed)
Waiting to receive faxed orders

## 2021-02-11 NOTE — Telephone Encounter (Signed)
Received faxed orders from [facility]. Placed in Dr. Idelle Leech inbox in front office to be signed.

## 2021-02-11 NOTE — Telephone Encounter (Signed)
Placed on PCP desk to review and sign, if appropriate.  

## 2021-02-12 DIAGNOSIS — J9610 Chronic respiratory failure, unspecified whether with hypoxia or hypercapnia: Secondary | ICD-10-CM | POA: Diagnosis not present

## 2021-02-12 DIAGNOSIS — K5909 Other constipation: Secondary | ICD-10-CM | POA: Diagnosis not present

## 2021-02-12 DIAGNOSIS — E669 Obesity, unspecified: Secondary | ICD-10-CM | POA: Diagnosis not present

## 2021-02-12 DIAGNOSIS — G4700 Insomnia, unspecified: Secondary | ICD-10-CM | POA: Diagnosis not present

## 2021-02-12 DIAGNOSIS — G7111 Myotonic muscular dystrophy: Secondary | ICD-10-CM | POA: Diagnosis not present

## 2021-02-12 DIAGNOSIS — G4733 Obstructive sleep apnea (adult) (pediatric): Secondary | ICD-10-CM | POA: Diagnosis not present

## 2021-02-12 DIAGNOSIS — N529 Male erectile dysfunction, unspecified: Secondary | ICD-10-CM | POA: Diagnosis not present

## 2021-02-12 DIAGNOSIS — F32A Depression, unspecified: Secondary | ICD-10-CM | POA: Diagnosis not present

## 2021-02-12 DIAGNOSIS — E039 Hypothyroidism, unspecified: Secondary | ICD-10-CM | POA: Diagnosis not present

## 2021-02-12 NOTE — Telephone Encounter (Signed)
Faxed signed form to [facility], fax confirmation received.

## 2021-02-16 ENCOUNTER — Telehealth: Payer: Self-pay | Admitting: Family Medicine

## 2021-02-16 NOTE — Telephone Encounter (Signed)
Received faxed orders from [facility] marked urgent. Colletta Maryland called ahead and stated she did not receive previously faxed orders. Placed in Dr. Idelle Leech inbox in front office to be signed.

## 2021-02-16 NOTE — Telephone Encounter (Signed)
HH orders for OT services. Placed on PCP desk to review and sign, if appropriate.

## 2021-02-17 ENCOUNTER — Other Ambulatory Visit: Payer: Self-pay | Admitting: *Deleted

## 2021-02-17 NOTE — Patient Outreach (Signed)
[facility]) Care Management  [facility] Care Manager  02/17/2021   [name] [birth year] [id number]    Telephone follow up   Patient is 60 year old male with history of Myotonic muscular dystrophy, wheelchair bound, hypothyroidism.    Subjective:  Successful outreach call to patient wife [name], she states believe that they are doing okay.  She discussed being the in home physical and occupational therapy as been beneficial , but thinks patient is near reaching max benefit of home therapy. She states she can a difference in what he can do,to assist with his movement at home. She states that he stills need assist with movement, but she states that she has reached her goal of getting tips and tricks with safe way for movement and providing patient exercises that he can continue to work on.  She discussed being hopeful at patient request to go outside the home to ride in wheelchair in the neighbor hood states before that he was only going outside the home for appointments.  Patient wife has made contact with Dr. Tillman Abide at [facility]. regarding follow up appointment   Objective:   Encounter Medications:  Outpatient Encounter Medications as of 02/17/2021  Medication Sig  . acetaminophen (TYLENOL) 325 MG tablet Take 1-2 tablets (325-650 mg total) by mouth every 4 (four) hours as needed for mild pain.  . ADDERALL XR 30 MG 24 hr capsule TAKE 1 CAPSULE BY MOUTH DAILY.  Marland Kitchen amphetamine-dextroamphetamine (ADDERALL XR) 10 MG 24 hr capsule TAKE 1 CAPSULE BY MOUTH EVERY MORNING (TO BE TAKEN WITH 30MG XR CAPSULES)  . clotrimazole-betamethasone (LOTRISONE) cream APPLY TO THE AFFECTED AREA(S) TWICE DAILY AS NEEDED  . FLUoxetine (PROZAC) 40 MG capsule TAKE 1 CAPSULE BY MOUTH ONCE DAILY  . glycopyrrolate (ROBINUL) 2 MG tablet TAKE 1/2 TO 1 TABLET BY MOUTH TWO TIMES DAILY AS NEEDED FOR EXCESSIVE ORAL SECRETIONS  . levothyroxine (SYNTHROID) 150 MCG  tablet TAKE 1 TABLET BY MOUTH ONCE DAILY EXCEPT FOR SUNDAY SKIP DOSE  . omeprazole (PRILOSEC) 40 MG capsule TAKE 1 CAPSULE BY MOUTH ONCE DAILY  . ondansetron (ZOFRAN) 8 MG tablet Take 1 tablet (8 mg total) by mouth every 8 (eight) hours as needed for nausea or vomiting. (Patient not taking: Reported on 01/26/2021)  . Simethicone 125 MG TABS Take 125 mg by mouth every 6 (six) hours as needed for flatulence. (Patient not taking: Reported on 01/26/2021)  . traZODone (DESYREL) 100 MG tablet TAKE 1.5 TABLETS BY MOUTH AT BEDTIME AS NEEDED FOR SLEEP.   No facility-administered encounter medications on file as of 02/17/2021.    Functional Status:  In your present state of health, do you have any difficulty performing the following activities: 12/07/2020 12/01/2020  Hearing? N N  Vision? N N  Difficulty concentrating or making decisions? N Y  Walking or climbing stairs? Y Y  Comment Patient has Myotonic Muscular Dystrophy - wife and son assist dependent with mobility  Dressing or bathing? Y Y  Comment Patient has Myotonic Muscular Dystrophy - wife and son assist family assist  Doing errands, shopping? Y Y  Comment Patient has Myotonic Muscular Dystrophy - wife and son assist family assist  Preparing Food and eating ? Y Y  Comment Patient has Myotonic Muscular Dystrophy - wife and son assist family assist  Using the Toilet? Y -  Comment Patient has Myotonic Muscular Dystrophy - wife and son assist -  In the past six months, have you accidently leaked urine?  Y Y  Comment Patient has Myotonic Muscular Dystrophy - wife and son assist -  Do you have problems with loss of bowel control? N N  Managing your Medications? Y Y  Comment Patient has Myotonic Muscular Dystrophy - wife and son assist wife does  Managing your Finances? Y N  Comment Patient has Myotonic Muscular Dystrophy - wife and son assist wife manages  Housekeeping or managing your Housekeeping? Y N  Comment Patient has Myotonic Muscular  Dystrophy - wife and son assist -  Some recent data might be hidden    Fall/Depression Screening: Fall Risk  12/07/2020  Falls in the past year? 1  Number falls in past yr: 1  Injury with Fall? 1  Risk for fall due to : History of fall(s);Impaired balance/gait;Impaired mobility  Follow up Education provided;Falls prevention discussed   PHQ 2/9 Scores 12/07/2020 08/02/2019 02/12/2019 07/17/2018 04/11/2018 01/10/2018 09/07/2017  PHQ - 2 Score 4 6 2 3 3 5 5  PHQ- 9 Score 7 19 12 13 16 17 13    Assessment: Patient managing chronic condition of Myotonic Muscular Dystrophy with assistance of wife. Patient family education on safe movement transfer goals met .  Goals Addressed            This Visit's Progress   . Find Help in My Community   On track    Timeframe:  Short-Term Goal Priority:  High Start Date:   12/01/20                          Expected End Date:  03/25/21                  Follow Up Date 03/17/21   - follow-up on any referrals for help I am given    Why is this important?    Knowing how and where to find help for yourself or family in your neighborhood and community is an important skill.   You will want to take some steps to learn how.    Notes:  Reinforced continued working on exercised taught by home therapy.Encouraged follow through on appointment at [facility] with neurologist .     . Mat[place]ain My Quality of Life   On track    Timeframe:  Long-Range Goal Priority:  Medium Start Date:     12/01/20                        Expected End Date:   03/17/21                  Follow Up Date 03/17/21   - discuss my treatment options with the doctor or nurse   Why is this important?    Having a long-term illness can be scary.   It can also be stressful for you and your caregiver.   These steps may help.    Notes:Awaiting appointment at Neurology at [facility]. Encouraged to discuss concern regarding not sleeping well during the night even with trazodone.         Plan:  Follow-up:  Patient agrees to Care Plan and Follow-up.Will plan follow up visit in the next month.     , RN, BSN  [facility] Care Management,Care Management Coordinator  [phone]- Mobile [phone]- Toll Free Main Office    

## 2021-02-18 ENCOUNTER — Other Ambulatory Visit: Payer: Self-pay | Admitting: Family Medicine

## 2021-02-18 ENCOUNTER — Telehealth: Payer: Self-pay | Admitting: Family Medicine

## 2021-02-18 MED ORDER — LEVOTHYROXINE SODIUM 150 MCG PO TABS: ORAL_TABLET | ORAL | 2 refills | Status: DC

## 2021-02-18 NOTE — Telephone Encounter (Signed)
Patient's wife called regarding levothyroxine refills. Quantity is 18 which she feels is incorrect because he takes six tabs per week. Please update RX at pharmacy.

## 2021-02-18 NOTE — Telephone Encounter (Signed)
Resent rx. Spoke with pt wife to make aware and apologies for the inconvenience. Pt wife verbalized understanding.

## 2021-02-24 DIAGNOSIS — E039 Hypothyroidism, unspecified: Secondary | ICD-10-CM | POA: Diagnosis not present

## 2021-02-24 DIAGNOSIS — E669 Obesity, unspecified: Secondary | ICD-10-CM | POA: Diagnosis not present

## 2021-02-24 DIAGNOSIS — N529 Male erectile dysfunction, unspecified: Secondary | ICD-10-CM | POA: Diagnosis not present

## 2021-02-24 DIAGNOSIS — K5909 Other constipation: Secondary | ICD-10-CM | POA: Diagnosis not present

## 2021-02-24 DIAGNOSIS — G4733 Obstructive sleep apnea (adult) (pediatric): Secondary | ICD-10-CM | POA: Diagnosis not present

## 2021-02-24 DIAGNOSIS — G4700 Insomnia, unspecified: Secondary | ICD-10-CM | POA: Diagnosis not present

## 2021-02-24 DIAGNOSIS — G7111 Myotonic muscular dystrophy: Secondary | ICD-10-CM | POA: Diagnosis not present

## 2021-02-24 DIAGNOSIS — F32A Depression, unspecified: Secondary | ICD-10-CM | POA: Diagnosis not present

## 2021-02-24 DIAGNOSIS — J9610 Chronic respiratory failure, unspecified whether with hypoxia or hypercapnia: Secondary | ICD-10-CM | POA: Diagnosis not present

## 2021-02-25 ENCOUNTER — Other Ambulatory Visit: Payer: Self-pay | Admitting: Family Medicine

## 2021-02-25 NOTE — Telephone Encounter (Signed)
Requesting: adderall 30, 10mg  Contract: 01/26/21 UDS: n/a Last Visit: 01/26/21 Next Visit: advised to f/u 3 mo. Last Refill: 01/19/21 (30,0)  Please Advise. Medications pending

## 2021-02-26 NOTE — Telephone Encounter (Signed)
Spoke with pt's wife,Nancy(DPR) to advise refills sent.

## 2021-03-08 ENCOUNTER — Other Ambulatory Visit: Payer: Self-pay | Admitting: Family Medicine

## 2021-03-17 ENCOUNTER — Other Ambulatory Visit: Payer: Self-pay | Admitting: *Deleted

## 2021-03-17 NOTE — Patient Outreach (Signed)
[facility]) Care Management  [place]  03/17/2021   DYSHAWN CANGELOSI [birth year] [id number]  Telephone Assessment  Monthly outreach  Outreach Attempt  Subjective: Unsuccessful outreach call , no answer able to leave a HIPAA compliant voicemail message for return call.   Plan If no  return call , will plan follow up call in the next month.    [name], RN, BSN  [facility] Management Coordinator  [phone]- Mobile [phone]- Toll Free Main Office

## 2021-03-19 ENCOUNTER — Other Ambulatory Visit (HOSPITAL_BASED_OUTPATIENT_CLINIC_OR_DEPARTMENT_OTHER): Payer: Self-pay

## 2021-03-24 ENCOUNTER — Telehealth: Payer: Self-pay

## 2021-03-24 NOTE — Telephone Encounter (Signed)
Patient wife, [name] [facility]) calling about patient getting choked yesterday and 911 was called to house.  By the time the ambulance arrived, patient was doing better.  They did tell him to follow up with PCP and let provider aware of issue. Does patient need to come in?  Wife concerned about pt aspirating possibly.  Please call [name] [phone]

## 2021-03-24 NOTE — Telephone Encounter (Signed)
Spoke with pt's wife, [name] and he is still eating solids. He has dentures but does not wear them. He had swallow study done years ago and was given exercises but stopped using them. Unsure if feeding tube is needed. Offered to schedule appt, agreed and appt scheduled.

## 2021-03-28 ENCOUNTER — Other Ambulatory Visit (HOSPITAL_COMMUNITY): Payer: Self-pay

## 2021-03-28 MED FILL — Levothyroxine Sodium Tab 150 MCG: ORAL | 30 days supply | Qty: 28 | Fill #0 | Status: AC

## 2021-03-29 ENCOUNTER — Encounter: Payer: Self-pay | Admitting: Family Medicine

## 2021-03-29 ENCOUNTER — Ambulatory Visit: Payer: 59 | Admitting: Family Medicine

## 2021-03-29 ENCOUNTER — Other Ambulatory Visit (HOSPITAL_COMMUNITY): Payer: Self-pay

## 2021-03-29 ENCOUNTER — Other Ambulatory Visit: Payer: Self-pay

## 2021-03-29 VITALS — BP 112/82 | HR 79 | Temp 97.0°F | Ht 64.0 in | Wt 224.0 lb

## 2021-03-29 DIAGNOSIS — G4700 Insomnia, unspecified: Secondary | ICD-10-CM | POA: Diagnosis not present

## 2021-03-29 DIAGNOSIS — E890 Postprocedural hypothyroidism: Secondary | ICD-10-CM

## 2021-03-29 DIAGNOSIS — T17908A Unspecified foreign body in respiratory tract, part unspecified causing other injury, initial encounter: Secondary | ICD-10-CM | POA: Diagnosis not present

## 2021-03-29 MED ORDER — TRAZODONE HCL 100 MG PO TABS
ORAL_TABLET | ORAL | 3 refills | Status: DC
  Filled 2021-03-29 – 2021-05-01 (×2): qty 180, 90d supply, fill #0
  Filled 2021-08-16: qty 180, 90d supply, fill #1
  Filled 2021-11-15: qty 180, 90d supply, fill #2
  Filled 2022-02-20: qty 180, 90d supply, fill #3

## 2021-03-29 NOTE — Progress Notes (Signed)
OFFICE VISIT  03/29/2021  CC:  Chief Complaint  Patient presents with  . Choking    Happened at home on 03/23/21 while eating and oxygen dropped , EMS was called but not taken to hospital    HPI:    Patient is a 60 y.o. Caucasian male with myotonic muscular dystrophy who presents for discussion of aspiration issues. I last saw him 2 mo ago. A/P as of that visit: "1) Adult ADD/chronic fatigue and somnolence: doing fine/getting decent benefit from daily adderall xr 30mg  + 10mg  xr qAM. CSC renewed today.  No new rx for this med was needed today.  2) Recurrent MDD: doing fairly well on fluox 40mg  qd.  3) Insomnia: trazodone 100mg , 1.5 tabs qhs helpful.  4) Hypothyroidism: TSH monitoring today.  5) Myotonic dystrophy: stable from resp standpoint. Generalized weakness and debilitation stable, has all approp HH devices and benefiting from PT/OT.  6) Health maintenance exam: Reviewed age and gender appropriate health maintenance issues (prudent diet, regular exercise, health risks of tobacco and excessive alcohol, use of seatbelts, fire alarms in home, use of sunscreen).  Also reviewed age and gender appropriate health screening as well as vaccine recommendations. Vaccines: Flu->UTD.  Otherwise ALL UTD. Labs: FLP, CBC, CMET, TSH. Prostate ca screening: discussed pros/cons of further prostate ca screening today->DECIDED TO DO NO FURTHER PROSTATE CA SCREENING. Colon ca screening: discussed pros/cons of further colon ca screening today->DECIDED TO DO NO FURTHER COLON CA SCREENING."  INTERIM HX: Six d/a he had choking episode while eating late at night when he was tired/sleepy, turned bluish, EMS was called, 02 sat 84% initially, came up gradually as he cleared his airway better with wet coughing.  They did not have to suction him.  He declined going to ED.  O2 sats up over 90% before EMS left.   He has continued to have a wet sounding cough off and on and this has gradually dissipated.   No fever, not saying he feels bad, no SOB.   Eating soft diet as per his usual, wife sayd he won't wear his dentures regularly.  No recurrence of choking or trouble swallowing since episode 1 wk ago. He had no signif pattern of choking or trouble swallowing with eating leading up to the episode.   He takes glycopyrolate 2mg  qhs.    Longstanding probs initiating and maintaining sleep, most nights but not all he is up q45 min. Seems to initiate sleep better since getting on melatonin 10mg  with his usual 1 and 1/2 of the trazodone 100mg  tabs qhs.    Taking synthroid daily as rx'd since last visit --at which time he was not as compliant and his TSH was 9.2.  ROS as above, plus--> no fevers, no CP, no SOB, no wheezing. No n/v/d or abd pain.  No palpitations.     Past Medical History:  Diagnosis Date  . Adult ADHD   . Aspiration pneumonitis ([place]) 04/2017, 08/2017   Recurrent  . Chronic respiratory failure ([place])    As of 04/2018, Dr. Chase Caller suspects hypercapnic RF--but [name] normal.  May eventually need nocturnal noninvasive ventilation.  Ultimately, if progresses enough pt will need trach.  . Constipation, chronic    Has caused irritative urinary/bladder symptoms in the past.  . Depression   . Dizziness 06/2011+   Noncontrast CT and noncontrast MRI both negative 06/2011 at [facility] ED visit.  . Elevated transaminase level    Liver biopsy revealed fatty liver  . Erectile dysfunction   . Facial  fracture ([place]) 06/2011   Nondisplaced bilateral [place] I fracture: no surgery required. (ENT, Dr. Wilburn Cornelia)   . Fatty liver disease, nonalcoholic   . GERD (gastroesophageal reflux disease)   . History of thyroid cancer 2010   Medullary carcinoma of thyroid. Thyroidectomy 08/2009 (Dr. Aida Puffer at [facility]).  Needs periodic serum calcitonin monitoring.  Marland Kitchen Hyperlipidemia   . Hypothyroidism   . LVH (left ventricular hypertrophy)    by EKG criteria 06/2011.  Hx of heart murmur--Followed by cardiology at [facility]:  EF's have been "stable" per cardio office note 12/29/10.  His echo 12/2012 showed normal LV size and wall thickness but EF 45-50% and global LV hypokinesis was found.  . Myotonic dystrophy ([place]) Dx'd 1996   Progressive muscle weakness and swallowing dysfunction.  Initial dx by Dr. Jannifer Franklin, neurologist, but subsequent annual specialist f/u has been with Dr. Ladon [facility].  Progressive debilitation--PT referral 04/2017, pt quit PT 05/2017.  . Nephrolithiasis   . Obesity, Class I, BMI 30-34.9   . OSA (obstructive sleep apnea)    SEVERE 08/2019 sleep study). CPAP to be initiated as of 04/18/20 pulm (Dr. Annamaria Boots)  . Pulmonary nodule, right 05/2017   RUL, 7 mm sub solid nodule.  Resolution noted on f/u CT 08/2017.    Past Surgical History:  Procedure Laterality Date  . COLONOSCOPY  09/13/2007   normal (Dr. Deatra Ina).  . EXTRACORPOREAL SHOCK WAVE LITHOTRIPSY    . Home sleep study  08/31/2019   Severe OSA  . THYROIDECTOMY  08/2009   for medullary thyroid cancer ([facility]. Conley Canal at [facility] follows him for this).  . TRANSTHORACIC ECHOCARDIOGRAM  12/2012; 09/2017   2014-Normal LV size and wall thickness, LV EF 45-50%, with global hypokinesis of LV.  2018- EF 50-55%, normal wall motion, grd I DD, valves normal.    Outpatient Medications Prior to Visit  Medication Sig Dispense Refill  . acetaminophen (TYLENOL) 325 MG tablet Take 1-2 tablets (325-650 mg total) by mouth every 4 (four) hours as needed for mild pain.    Marland Kitchen amphetamine-dextroamphetamine (ADDERALL XR) 10 MG 24 hr capsule TAKE 1 CAPSULE BY MOUTH ONCE DAILY (TO BE TAKEN WITH 30 MG XR CAPS) 30 capsule 0  . amphetamine-dextroamphetamine (ADDERALL XR) 30 MG 24 hr capsule TAKE 1 CAPSULE BY MOUTH ONCE DAILY 30 capsule 0  . clotrimazole-betamethasone (LOTRISONE) cream APPLY TO THE AFFECTED AREA(S) TWICE DAILY AS NEEDED 45 g 1  . COVID-19 mRNA vaccine, Pfizer, 30 MCG/0.3ML injection INJECT AS DIRECTED .3 mL 0  . FLUoxetine (PROZAC) 40 MG capsule TAKE 1 CAPSULE  BY MOUTH ONCE DAILY 90 capsule 1  . glycopyrrolate (ROBINUL) 2 MG tablet TAKE 1/2 TO 1 TABLET BY MOUTH TWO TIMES DAILY AS NEEDED FOR EXCESSIVE ORAL SECRETIONS 60 tablet 6  . levothyroxine (SYNTHROID) 150 MCG tablet TAKE 1 TABLET BY MOUTH ONCE DAILY EXCEPT FOR SUNDAY SKIP DOSE 28 tablet 2  . omeprazole (PRILOSEC) 40 MG capsule TAKE 1 CAPSULE BY MOUTH ONCE DAILY 90 capsule 2  . ondansetron (ZOFRAN) 8 MG tablet Take 1 tablet (8 mg total) by mouth every 8 (eight) hours as needed for nausea or vomiting. 20 tablet 1  . Simethicone 125 MG TABS Take 125 mg by mouth every 6 (six) hours as needed for flatulence.    . traZODone (DESYREL) 100 MG tablet TAKE 1 AND 1/2 TABLET BY MOUTH AT BEDTIME AS NEEDED FOR SLEEP 135 tablet 3  . chlorhexidine (PERIDEX) 0.12 % solution USE 15ML TO SWISH FOR 2 FULL MINUTES THEN SPIT  OUT--USE TWICE DAILY 473 mL 0  . HYDROcodone-acetaminophen (NORCO/VICODIN) 5-325 MG tablet TAKE 1 TABLET BY MOUTH EVERY 4 TO 6 HOURS AS NEEDED FOR PAIN. 14 tablet 0  . penicillin v potassium (VEETID) 500 MG tablet TAKE 1 TABLET BY MOUTH 4 TIMES DAILY UNTIL FINISHED 28 tablet 0   No facility-administered medications prior to visit.    Not on File  ROS As per HPI  PE: Vitals with BMI 03/29/2021 01/26/2021 07/17/2020  Height 5\' 4"  5\' 4"  5\' 4"   Weight 224 lbs 227 lbs 222 lbs  BMI 38.43 01.75 10.25  Systolic 852 778 242  Diastolic 82 86 77  Pulse 79 93 100  O2 sat on RA today is 99%   Gen: Alert, well appearing.  Patient is oriented to person, place, time, and situation. AFFECT: pleasant, lucid thought and speech. CV: RRR, no m/r/g.   LUNGS: CTA bilat, nonlabored resps, good aeration in all lung fields. EXT: no cyanosis  LABS:    Chemistry      Component Value Date/Time   NA 141 01/26/2021 1037   K 4.4 01/26/2021 1037   CL 102 01/26/2021 1037   CO2 35 (H) 01/26/2021 1037   BUN 18 01/26/2021 1037   CREATININE 0.75 01/26/2021 1037   CREATININE 0.95 02/20/2013 1604      Component  Value Date/Time   CALCIUM 10.0 01/26/2021 1037   ALKPHOS 66 01/26/2021 1037   AST 21 01/26/2021 1037   ALT 20 01/26/2021 1037   BILITOT 0.4 01/26/2021 1037     Lab Results  Component Value Date   TSH 9.21 (H) 01/26/2021   IMPRESSION AND PLAN:  1) Aspiration episode x 1; does not seem to have had signif lasting pulm consequence such as aspiration pneumonitis. No prob with secretions between meals and no recurrent probs with meals before or after that episode.  Cont soft diet, try to wear dentures, cont glycopyrrolate 2mg  qhs but he can use this up to tid prn.  Cont omeprazole 40mg  qd.  2) Insomnia: improved initiation since getting on melatonin 2mg  qhs. Try increase in trazodone to max of 200mg  qhs.  Consider add-on of antihistamine like diphyenhydramine or hydroxyzine in future if max trazodone not helpful. Avoiding sleep aids with potential resp depression side effect.  3) Hypothyroidism: medication compliance has been an issue with this in the past. Seems better lately. Plan recheck TSH at f/u in 2 mo.  An After Visit Summary was printed and given to the patient.  FOLLOW UP: No follow-ups on file.  Signed:  Crissie Sickles, MD           03/29/2021

## 2021-03-30 ENCOUNTER — Other Ambulatory Visit (HOSPITAL_COMMUNITY): Payer: Self-pay

## 2021-04-01 ENCOUNTER — Emergency Department
Admission: EM | Admit: 2021-04-01 | Discharge: 2021-04-01 | Disposition: A | Discharge status: Home / Self Care | Payer: 59 | Source: Home / Self Care | Attending: Emergency Medicine | Admitting: Emergency Medicine

## 2021-04-01 ENCOUNTER — Ambulatory Visit (HOSPITAL_COMMUNITY)
Admission: EM | Admit: 2021-04-01 | Discharge: 2021-04-01 | Disposition: A | Discharge status: Home / Self Care | Payer: 59

## 2021-04-01 ENCOUNTER — Encounter: Payer: Self-pay | Admitting: Emergency Medicine

## 2021-04-01 ENCOUNTER — Other Ambulatory Visit: Payer: Self-pay

## 2021-04-01 ENCOUNTER — Emergency Department (INDEPENDENT_AMBULATORY_CARE_PROVIDER_SITE_OTHER): Payer: 59

## 2021-04-01 ENCOUNTER — Other Ambulatory Visit (HOSPITAL_BASED_OUTPATIENT_CLINIC_OR_DEPARTMENT_OTHER): Payer: Self-pay

## 2021-04-01 DIAGNOSIS — W19XXXA Unspecified fall, initial encounter: Secondary | ICD-10-CM | POA: Diagnosis not present

## 2021-04-01 DIAGNOSIS — S52692A Other fracture of lower end of left ulna, initial encounter for closed fracture: Secondary | ICD-10-CM | POA: Diagnosis not present

## 2021-04-01 DIAGNOSIS — S0081XA Abrasion of other part of head, initial encounter: Secondary | ICD-10-CM

## 2021-04-01 DIAGNOSIS — M25532 Pain in left wrist: Secondary | ICD-10-CM | POA: Diagnosis not present

## 2021-04-01 DIAGNOSIS — M7989 Other specified soft tissue disorders: Secondary | ICD-10-CM | POA: Diagnosis not present

## 2021-04-01 DIAGNOSIS — M25432 Effusion, left wrist: Secondary | ICD-10-CM

## 2021-04-01 MED ORDER — IBUPROFEN 600 MG PO TABS
600.0000 mg | ORAL_TABLET | Freq: Four times a day (QID) | ORAL | 0 refills | Status: DC | PRN
  Filled 2021-04-01: qty 30, 8d supply, fill #0
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
## 2021-04-01 NOTE — Progress Notes (Signed)
Orthopedic Tech Progress Note Patient Details:  [name] 1961-05-18 [id number]  Ortho Devices Type of Ortho Device: Sugartong splint Ortho Device/Splint Location: [name] Device/Splint Interventions: Ordered,Application,Adjustment   Post Interventions Patient Tolerated: Well Instructions Provided: Care of device   [name] [name] 04/01/2021, 11:35 AM

## 2021-04-01 NOTE — ED Triage Notes (Signed)
Fell last night at 2030 when wallking to get his phone Not witnessed  [place] [address] onto carpet  Denies LOC  Abrasion to left side of face Swelling noted to left hand/wrist- pain & limited ROM- OTC tylenol at 0800 & ice Wife here with pt  Pt poor historian

## 2021-04-01 NOTE — Discharge Instructions (Addendum)
Keep the abrasions clean and dry.  May apply bacitracin to it several times a day.  He has a fracture of his wrist.  I am putting him in a splint.  He needs to follow-up with Dr. Jeannie Fend in [facility] within a week.  Go immediately to the ED for the signs and symptoms we discussed  Take 600 mg of ibuprofen with a Tylenol-containing product 3-4 times a day as needed for pain.  Either 1000 mg of Tylenol for mild to moderate pain, or 1-2 Norco for severe pain.

## 2021-04-01 NOTE — ED Notes (Signed)
Patient is being discharged from the Urgent Care and sent to the [facility] UC via POV driven by wife. Per Dr Alphonzo Cruise, patient is in need of sugar tong splint for fracture by Ortho Tech. Patient is aware and verbalizes understanding of plan of care.  Vitals:   04/01/21 0914  BP: 116/77  Pulse: 82  Resp: 15  Temp: 97.8 F (36.6 C)  SpO2: 96%

## 2021-04-01 NOTE — ED Notes (Signed)
Ortho tech placed a sling on patient after applying ordered splint/cast.  Patient/family member denied questions.  Discharged to home.  Instructions by previous provider this morning.

## 2021-04-01 NOTE — ED Notes (Signed)
Patient sent to [place] urgent [facility] for ortho tech splint placement.

## 2021-04-08 DIAGNOSIS — M25532 Pain in left wrist: Secondary | ICD-10-CM | POA: Diagnosis not present

## 2021-04-14 ENCOUNTER — Other Ambulatory Visit: Payer: Self-pay | Admitting: *Deleted

## 2021-04-14 NOTE — Patient Outreach (Signed)
[facility]) Care Management  [place]  04/14/2021   [name] 05-Dec-1961 [id number]  Telephone Assessment  Monthly outreach  Outreach Attempt  Subjective: Unsuccessful outreach call , no answer able to leave a HIPAA compliant voicemail message for return call.   Plan If no  return call , will plan follow up call in the next month.    [name], RN, BSN  [place] Management Coordinator  [phone]- Mobile [phone]- Toll Free Main Office

## 2021-04-16 ENCOUNTER — Other Ambulatory Visit: Payer: Self-pay | Admitting: Family Medicine

## 2021-04-16 ENCOUNTER — Other Ambulatory Visit (HOSPITAL_COMMUNITY): Payer: Self-pay

## 2021-04-16 MED ORDER — AMPHETAMINE-DEXTROAMPHET ER 30 MG PO CP24
ORAL_CAPSULE | Freq: Every day | ORAL | 0 refills | Status: DC
  Filled 2021-04-16: qty 30, 30d supply, fill #0

## 2021-04-16 MED ORDER — AMPHETAMINE-DEXTROAMPHET ER 10 MG PO CP24
ORAL_CAPSULE | ORAL | 0 refills | Status: DC
  Filled 2021-04-16: qty 30, 30d supply, fill #0

## 2021-04-16 NOTE — Telephone Encounter (Signed)
Requesting: Adderall 10 & 30mg  Contract: 01/26/21 UDS: n/a Last Visit: 03/29/21 Next Visit:05/28/21 Last Refill:02/25/21(30,0)  Please Advise. Medications pending

## 2021-04-17 ENCOUNTER — Other Ambulatory Visit (HOSPITAL_COMMUNITY): Payer: Self-pay

## 2021-04-19 DIAGNOSIS — S52622D Torus fracture of lower end of left ulna, subsequent encounter for fracture with routine healing: Secondary | ICD-10-CM | POA: Diagnosis not present

## 2021-04-19 NOTE — Telephone Encounter (Signed)
Spoke with pt's wife, [name] regarding refills sent.

## 2021-05-01 ENCOUNTER — Other Ambulatory Visit (HOSPITAL_COMMUNITY): Payer: Self-pay

## 2021-05-01 MED FILL — Levothyroxine Sodium Tab 150 MCG: ORAL | 30 days supply | Qty: 28 | Fill #1 | Status: AC

## 2021-05-03 ENCOUNTER — Other Ambulatory Visit (HOSPITAL_COMMUNITY): Payer: Self-pay

## 2021-05-04 DIAGNOSIS — G7111 Myotonic muscular dystrophy: Secondary | ICD-10-CM | POA: Diagnosis not present

## 2021-05-04 DIAGNOSIS — R799 Abnormal finding of blood chemistry, unspecified: Secondary | ICD-10-CM | POA: Diagnosis not present

## 2021-05-12 ENCOUNTER — Encounter (HOSPITAL_COMMUNITY): Payer: Self-pay | Admitting: Internal Medicine

## 2021-05-12 ENCOUNTER — Emergency Department (HOSPITAL_COMMUNITY): Payer: 59

## 2021-05-12 ENCOUNTER — Inpatient Hospital Stay (HOSPITAL_COMMUNITY)
Admission: EM | Admit: 2021-05-12 | Discharge: 2021-05-19 | DRG: 177 | Disposition: A | Discharge status: Home Health | Payer: 59 | Attending: Family Medicine | Admitting: Family Medicine

## 2021-05-12 ENCOUNTER — Ambulatory Visit: Payer: Self-pay | Admitting: *Deleted

## 2021-05-12 ENCOUNTER — Other Ambulatory Visit: Payer: Self-pay

## 2021-05-12 DIAGNOSIS — K219 Gastro-esophageal reflux disease without esophagitis: Secondary | ICD-10-CM | POA: Diagnosis not present

## 2021-05-12 DIAGNOSIS — J690 Pneumonitis due to inhalation of food and vomit: Principal | ICD-10-CM | POA: Diagnosis present

## 2021-05-12 DIAGNOSIS — Z20822 Contact with and (suspected) exposure to covid-19: Secondary | ICD-10-CM | POA: Diagnosis not present

## 2021-05-12 DIAGNOSIS — F32A Depression, unspecified: Secondary | ICD-10-CM | POA: Diagnosis present

## 2021-05-12 DIAGNOSIS — R1314 Dysphagia, pharyngoesophageal phase: Secondary | ICD-10-CM | POA: Diagnosis present

## 2021-05-12 DIAGNOSIS — E669 Obesity, unspecified: Secondary | ICD-10-CM | POA: Diagnosis present

## 2021-05-12 DIAGNOSIS — I509 Heart failure, unspecified: Secondary | ICD-10-CM | POA: Diagnosis not present

## 2021-05-12 DIAGNOSIS — K224 Dyskinesia of esophagus: Secondary | ICD-10-CM | POA: Diagnosis present

## 2021-05-12 DIAGNOSIS — F419 Anxiety disorder, unspecified: Secondary | ICD-10-CM | POA: Diagnosis present

## 2021-05-12 DIAGNOSIS — K760 Fatty (change of) liver, not elsewhere classified: Secondary | ICD-10-CM | POA: Diagnosis present

## 2021-05-12 DIAGNOSIS — J9621 Acute and chronic respiratory failure with hypoxia: Secondary | ICD-10-CM | POA: Diagnosis present

## 2021-05-12 DIAGNOSIS — E860 Dehydration: Secondary | ICD-10-CM | POA: Diagnosis present

## 2021-05-12 DIAGNOSIS — E872 Acidosis: Secondary | ICD-10-CM | POA: Diagnosis not present

## 2021-05-12 DIAGNOSIS — R1312 Dysphagia, oropharyngeal phase: Secondary | ICD-10-CM | POA: Diagnosis not present

## 2021-05-12 DIAGNOSIS — I959 Hypotension, unspecified: Secondary | ICD-10-CM | POA: Diagnosis not present

## 2021-05-12 DIAGNOSIS — T17908D Unspecified foreign body in respiratory tract, part unspecified causing other injury, subsequent encounter: Secondary | ICD-10-CM | POA: Diagnosis not present

## 2021-05-12 DIAGNOSIS — R251 Tremor, unspecified: Secondary | ICD-10-CM | POA: Diagnosis present

## 2021-05-12 DIAGNOSIS — Z8042 Family history of malignant neoplasm of prostate: Secondary | ICD-10-CM

## 2021-05-12 DIAGNOSIS — J189 Pneumonia, unspecified organism: Secondary | ICD-10-CM | POA: Diagnosis not present

## 2021-05-12 DIAGNOSIS — D696 Thrombocytopenia, unspecified: Secondary | ICD-10-CM | POA: Diagnosis present

## 2021-05-12 DIAGNOSIS — Z811 Family history of alcohol abuse and dependence: Secondary | ICD-10-CM

## 2021-05-12 DIAGNOSIS — K5909 Other constipation: Secondary | ICD-10-CM | POA: Diagnosis present

## 2021-05-12 DIAGNOSIS — Z79899 Other long term (current) drug therapy: Secondary | ICD-10-CM

## 2021-05-12 DIAGNOSIS — J9611 Chronic respiratory failure with hypoxia: Secondary | ICD-10-CM | POA: Diagnosis not present

## 2021-05-12 DIAGNOSIS — Z9119 Patient's noncompliance with other medical treatment and regimen: Secondary | ICD-10-CM | POA: Diagnosis not present

## 2021-05-12 DIAGNOSIS — G4733 Obstructive sleep apnea (adult) (pediatric): Secondary | ICD-10-CM | POA: Diagnosis present

## 2021-05-12 DIAGNOSIS — J9811 Atelectasis: Secondary | ICD-10-CM | POA: Diagnosis not present

## 2021-05-12 DIAGNOSIS — R531 Weakness: Secondary | ICD-10-CM | POA: Diagnosis not present

## 2021-05-12 DIAGNOSIS — Z8261 Family history of arthritis: Secondary | ICD-10-CM

## 2021-05-12 DIAGNOSIS — T17908A Unspecified foreign body in respiratory tract, part unspecified causing other injury, initial encounter: Secondary | ICD-10-CM

## 2021-05-12 DIAGNOSIS — R4182 Altered mental status, unspecified: Secondary | ICD-10-CM | POA: Diagnosis present

## 2021-05-12 DIAGNOSIS — E785 Hyperlipidemia, unspecified: Secondary | ICD-10-CM | POA: Diagnosis present

## 2021-05-12 DIAGNOSIS — G7111 Myotonic muscular dystrophy: Secondary | ICD-10-CM | POA: Diagnosis not present

## 2021-05-12 DIAGNOSIS — R404 Transient alteration of awareness: Secondary | ICD-10-CM | POA: Diagnosis not present

## 2021-05-12 DIAGNOSIS — Z8249 Family history of ischemic heart disease and other diseases of the circulatory system: Secondary | ICD-10-CM

## 2021-05-12 DIAGNOSIS — Z6838 Body mass index (BMI) 38.0-38.9, adult: Secondary | ICD-10-CM

## 2021-05-12 DIAGNOSIS — F909 Attention-deficit hyperactivity disorder, unspecified type: Secondary | ICD-10-CM | POA: Diagnosis not present

## 2021-05-12 DIAGNOSIS — J9612 Chronic respiratory failure with hypercapnia: Secondary | ICD-10-CM | POA: Diagnosis not present

## 2021-05-12 DIAGNOSIS — R9431 Abnormal electrocardiogram [ECG] [EKG]: Secondary | ICD-10-CM | POA: Diagnosis not present

## 2021-05-12 DIAGNOSIS — R1319 Other dysphagia: Secondary | ICD-10-CM | POA: Diagnosis not present

## 2021-05-12 DIAGNOSIS — E890 Postprocedural hypothyroidism: Secondary | ICD-10-CM | POA: Diagnosis present

## 2021-05-12 DIAGNOSIS — R0602 Shortness of breath: Secondary | ICD-10-CM | POA: Diagnosis not present

## 2021-05-12 DIAGNOSIS — J9622 Acute and chronic respiratory failure with hypercapnia: Secondary | ICD-10-CM | POA: Diagnosis not present

## 2021-05-12 DIAGNOSIS — R0600 Dyspnea, unspecified: Secondary | ICD-10-CM

## 2021-05-12 DIAGNOSIS — Z800 Family history of malignant neoplasm of digestive organs: Secondary | ICD-10-CM

## 2021-05-12 DIAGNOSIS — Z85850 Personal history of malignant neoplasm of thyroid: Secondary | ICD-10-CM

## 2021-05-12 DIAGNOSIS — Z79890 Hormone replacement therapy (postmenopausal): Secondary | ICD-10-CM

## 2021-05-12 DIAGNOSIS — R1310 Dysphagia, unspecified: Secondary | ICD-10-CM

## 2021-05-12 DIAGNOSIS — R110 Nausea: Secondary | ICD-10-CM | POA: Diagnosis not present

## 2021-05-12 DIAGNOSIS — R0902 Hypoxemia: Secondary | ICD-10-CM | POA: Diagnosis not present

## 2021-05-12 LAB — COMPREHENSIVE METABOLIC PANEL
ALT: 36 U/L (ref 0–44)
AST: 48 U/L — ABNORMAL HIGH (ref 15–41)
Albumin: 3.5 g/dL (ref 3.5–5.0)
Alkaline Phosphatase: 63 U/L (ref 38–126)
Anion gap: 6 (ref 5–15)
BUN: 12 mg/dL (ref 6–20)
CO2: 34 mmol/L — ABNORMAL HIGH (ref 22–32)
Calcium: 9.1 mg/dL (ref 8.9–10.3)
Chloride: 99 mmol/L (ref 98–111)
Creatinine, Ser: 0.96 mg/dL (ref 0.61–1.24)
GFR, Estimated: >60 mL/min (ref >60)
Glucose, Bld: 92 mg/dL (ref 70–99)
Potassium: 5.4 mmol/L — ABNORMAL HIGH (ref 3.5–5.1)
Sodium: 139 mmol/L (ref 135–145)
Total Bilirubin: 1.2 mg/dL (ref 0.3–1.2)
Total Protein: 6.8 g/dL (ref 6.5–8.1)

## 2021-05-12 LAB — PROTIME-INR
INR: 1.1 (ref 0.8–1.2)
Prothrombin Time: 13.8 seconds (ref 11.4–15.2)

## 2021-05-12 LAB — I-STAT VENOUS BLOOD GAS, ED
Acid-Base Excess: 5 mmol/L — ABNORMAL HIGH (ref 0.0–2.0)
Bicarbonate: 35 mmol/L — ABNORMAL HIGH (ref 20.0–28.0)
Calcium, Ion: 1.15 mmol/L (ref 1.15–1.40)
HCT: 47 % (ref 39.0–52.0)
Hemoglobin: 16 g/dL (ref 13.0–17.0)
O2 Saturation: 53 %
Potassium: 4.3 mmol/L (ref 3.5–5.1)
Sodium: 139 mmol/L (ref 135–145)
TCO2: 37 mmol/L — ABNORMAL HIGH (ref 22–32)
pCO2, Ven: 72.8 mmHg (ref 44.0–60.0)
pH, Ven: 7.29 (ref 7.250–7.430)
pO2, Ven: 33 mmHg (ref 32.0–45.0)

## 2021-05-12 LAB — I-STAT CHEM 8, ED
BUN: 13 mg/dL (ref 6–20)
Calcium, Ion: 1.17 mmol/L (ref 1.15–1.40)
Chloride: 99 mmol/L (ref 98–111)
Creatinine, Ser: 0.8 mg/dL (ref 0.61–1.24)
Glucose, Bld: 95 mg/dL (ref 70–99)
HCT: 48 % (ref 39.0–52.0)
Hemoglobin: 16.3 g/dL (ref 13.0–17.0)
Potassium: 4.3 mmol/L (ref 3.5–5.1)
Sodium: 139 mmol/L (ref 135–145)
TCO2: 33 mmol/L — ABNORMAL HIGH (ref 22–32)

## 2021-05-12 LAB — URINALYSIS, ROUTINE W REFLEX MICROSCOPIC
Bilirubin Urine: NEGATIVE
Glucose, UA: NEGATIVE mg/dL
Hgb urine dipstick: NEGATIVE
Ketones, ur: NEGATIVE mg/dL
Leukocytes,Ua: NEGATIVE
Nitrite: NEGATIVE
Protein, ur: NEGATIVE mg/dL
Specific Gravity, Urine: 1.02 (ref 1.005–1.030)
pH: 5 (ref 5.0–8.0)

## 2021-05-12 LAB — CBC WITH DIFFERENTIAL/PLATELET
Abs Immature Granulocytes: 0.04 10*3/uL (ref 0.00–0.07)
Basophils Absolute: 0 10*3/uL (ref 0.0–0.1)
Basophils Relative: 1 %
Eosinophils Absolute: 0 10*3/uL (ref 0.0–0.5)
Eosinophils Relative: 0 %
HCT: 49.3 % (ref 39.0–52.0)
Hemoglobin: 14.9 g/dL (ref 13.0–17.0)
Immature Granulocytes: 1 %
Lymphocytes Relative: 19 %
Lymphs Abs: 1 10*3/uL (ref 0.7–4.0)
MCH: 30.3 pg (ref 26.0–34.0)
MCHC: 30.2 g/dL (ref 30.0–36.0)
MCV: 100.4 fL — ABNORMAL HIGH (ref 80.0–100.0)
Monocytes Absolute: 0.6 10*3/uL (ref 0.1–1.0)
Monocytes Relative: 12 %
Neutro Abs: 3.4 10*3/uL (ref 1.7–7.7)
Neutrophils Relative %: 67 %
Platelets: 145 10*3/uL — ABNORMAL LOW (ref 150–400)
RBC: 4.91 MIL/uL (ref 4.22–5.81)
RDW: 14.7 % (ref 11.5–15.5)
WBC: 5.1 10*3/uL (ref 4.0–10.5)
nRBC: 0 % (ref 0.0–0.2)

## 2021-05-12 LAB — RESP PANEL BY RT-PCR (FLU A&B, COVID) ARPGX2
Influenza A by PCR: NEGATIVE
Influenza B by PCR: NEGATIVE
SARS Coronavirus 2 by RT PCR: NEGATIVE

## 2021-05-12 LAB — BLOOD GAS, VENOUS
Acid-Base Excess: 5.9 mmol/L — ABNORMAL HIGH (ref 0.0–2.0)
Bicarbonate: 33.5 mmol/L — ABNORMAL HIGH (ref 20.0–28.0)
Drawn by: 5915
O2 Saturation: 47.2 %
Patient temperature: 37
pCO2, Ven: 87.4 mmHg (ref 44.0–60.0)
pH, Ven: 7.208 — ABNORMAL LOW (ref 7.250–7.430)
pO2, Ven: <31 mmHg — CL (ref 32.0–45.0)

## 2021-05-12 LAB — TSH: TSH: 0.098 u[IU]/mL — ABNORMAL LOW (ref 0.350–4.500)

## 2021-05-12 LAB — CK: Total CK: 127 U/L (ref 49–397)

## 2021-05-12 LAB — LACTIC ACID, PLASMA
Lactic Acid, Venous: 1.5 mmol/L (ref 0.5–1.9)
Lactic Acid, Venous: 3.5 mmol/L (ref 0.5–1.9)

## 2021-05-12 LAB — TROPONIN I (HIGH SENSITIVITY): Troponin I (High Sensitivity): 119 ng/L (ref <18)

## 2021-05-12 LAB — HIV ANTIBODY (ROUTINE TESTING W REFLEX): HIV Screen 4th Generation wRfx: NONREACTIVE

## 2021-05-12 LAB — BRAIN NATRIURETIC PEPTIDE: B Natriuretic Peptide: 60.9 pg/mL (ref 0.0–100.0)

## 2021-05-12 MED ORDER — AMPHETAMINE-DEXTROAMPHET ER 10 MG PO CP24
40.0000 mg | ORAL_CAPSULE | Freq: Every day | ORAL | Status: DC
  Administered 2021-05-13 – 2021-05-19 (×7): 40 mg via ORAL
  Filled 2021-05-12 (×7): qty 4

## 2021-05-12 MED ORDER — LEVOTHYROXINE SODIUM 75 MCG PO TABS
150.0000 ug | ORAL_TABLET | ORAL | Status: DC
  Administered 2021-05-13 – 2021-05-19 (×6): 150 ug via ORAL
  Filled 2021-05-12 (×6): qty 2

## 2021-05-12 MED ORDER — PIPERACILLIN-TAZOBACTAM 3.375 G IVPB
3.3750 g | Freq: Three times a day (TID) | INTRAVENOUS | Status: DC
  Administered 2021-05-12: 3.375 g via INTRAVENOUS
  Filled 2021-05-12: qty 50

## 2021-05-12 MED ORDER — MELATONIN 5 MG PO TABS
10.0000 mg | ORAL_TABLET | Freq: Every day | ORAL | Status: DC
  Administered 2021-05-13 – 2021-05-18 (×6): 10 mg via ORAL
  Filled 2021-05-12 (×6): qty 2

## 2021-05-12 MED ORDER — FLUOXETINE HCL 20 MG PO CAPS
40.0000 mg | ORAL_CAPSULE | Freq: Every day | ORAL | Status: DC
  Administered 2021-05-13 – 2021-05-19 (×7): 40 mg via ORAL
  Filled 2021-05-12 (×7): qty 2

## 2021-05-12 MED ORDER — ONDANSETRON HCL 4 MG PO TABS: 8.0000 mg | ORAL_TABLET | Freq: Three times a day (TID) | ORAL | Status: DC | PRN

## 2021-05-12 MED ORDER — TRAZODONE HCL 50 MG PO TABS
200.0000 mg | ORAL_TABLET | Freq: Every day | ORAL | Status: DC
  Administered 2021-05-13 – 2021-05-18 (×6): 200 mg via ORAL
  Filled 2021-05-12 (×6): qty 4

## 2021-05-12 MED ORDER — LEVOTHYROXINE SODIUM 75 MCG PO TABS: 150.0000 ug | ORAL_TABLET | ORAL | Status: DC

## 2021-05-12 MED ORDER — AMPHETAMINE-DEXTROAMPHET ER 10 MG PO CP24: 10.0000 mg | ORAL_CAPSULE | Freq: Every day | ORAL | Status: DC

## 2021-05-12 MED ORDER — PIPERACILLIN-TAZOBACTAM 3.375 G IVPB 30 MIN: 3.3750 g | Freq: Once | INTRAVENOUS | Status: DC

## 2021-05-12 MED ORDER — SODIUM CHLORIDE 0.9 % IV SOLN
3.0000 g | Freq: Three times a day (TID) | INTRAVENOUS | Status: DC
  Administered 2021-05-12 – 2021-05-18 (×17): 3 g via INTRAVENOUS
  Filled 2021-05-12: qty 8
  Filled 2021-05-12: qty 3
  Filled 2021-05-12: qty 8
  Filled 2021-05-12: qty 3
  Filled 2021-05-12 (×3): qty 8
  Filled 2021-05-12: qty 3
  Filled 2021-05-12 (×3): qty 8
  Filled 2021-05-12 (×2): qty 3
  Filled 2021-05-12: qty 8
  Filled 2021-05-12 (×2): qty 3
  Filled 2021-05-12 (×2): qty 8
  Filled 2021-05-12: qty 3
  Filled 2021-05-12 (×2): qty 8

## 2021-05-12 MED ORDER — ENOXAPARIN SODIUM 40 MG/0.4ML IJ SOSY
40.0000 mg | PREFILLED_SYRINGE | INTRAMUSCULAR | Status: DC
  Administered 2021-05-13 – 2021-05-19 (×7): 40 mg via SUBCUTANEOUS
  Filled 2021-05-12 (×7): qty 0.4

## 2021-05-12 MED ORDER — PANTOPRAZOLE SODIUM 40 MG PO TBEC
40.0000 mg | DELAYED_RELEASE_TABLET | Freq: Every day | ORAL | Status: DC
  Administered 2021-05-13 – 2021-05-19 (×7): 40 mg via ORAL
  Filled 2021-05-12 (×7): qty 1

## 2021-05-12 MED ORDER — AMPHETAMINE-DEXTROAMPHET ER 30 MG PO CP24: 30.0000 mg | ORAL_CAPSULE | Freq: Every day | ORAL | Status: DC

## 2021-05-12 MED ORDER — ACETAMINOPHEN 500 MG PO TABS
1000.0000 mg | ORAL_TABLET | Freq: Every day | ORAL | Status: DC
  Administered 2021-05-12 – 2021-05-18 (×7): 1000 mg via ORAL
  Filled 2021-05-12 (×7): qty 2

## 2021-05-12 MED ORDER — POTASSIUM CHLORIDE CRYS ER 20 MEQ PO TBCR
40.0000 meq | EXTENDED_RELEASE_TABLET | Freq: Once | ORAL | Status: AC
  Administered 2021-05-12: 40 meq via ORAL
  Filled 2021-05-12: qty 2

## 2021-05-12 MED ORDER — SODIUM CHLORIDE 0.9 % IV SOLN: INTRAVENOUS | Status: AC

## 2021-05-12 MED ORDER — GLYCOPYRROLATE 1 MG PO TABS: 1.0000 mg | ORAL_TABLET | Freq: Two times a day (BID) | ORAL | Status: DC | PRN

## 2021-05-12 NOTE — H&P (Signed)
History and Physical    [name] [id number] DOB: 1961/12/24 DOA: 05/12/2021  PCP: Tammi Sou, MD (Confirm with patient/family/NH records and if not entered, this has to be entered at [facility] point of entry) Patient coming from: Home  I have personally briefly reviewed patient's old medical records in [place]  Chief Complaint: SOB  HPI: [name] is a 60 y.o. male with medical history significant of myotonic dystrophy, OSA, noncompliant with CPAP, hypothyroidism, chronic dysphagia, anxiety/depression, presented with new onset of shortness of breath and hypoxia.  Wife reported that the patient became sleepy and lethargic with occasional confusion since yesterday.  Family checked patient's oxygen level this morning found it was in the 60s.  Patient denied any cough, shortness of breath fever chills.  But, patient's oral intake has significantly decreased since yesterday.  At baseline, patient eat regular texture food and drink thin liquid from bottle without problems.  Although occasionally patient appeared to aspirate swallowing secretions, for which PCP has started him on Robinul.  Patient has chronic dysphagia, secondary to his myotonic dystrophy, he has been followed with speech therapist and recommended feeding tubes however patient declined several times.   ED Course: Vital signs stable, no significant hypoxia.  VBG showed chronic CO2 retention.  Chest x-ray suspected right-sided aspiration.  Review of Systems: As per HPI otherwise 14 point review of systems negative.    Past Medical History:  Diagnosis Date  . Adult ADHD   . Aspiration pneumonitis ([place]) 04/2017, 08/2017   Recurrent  . Chronic respiratory failure ([place])    As of 04/2018, Dr. Chase Caller suspects hypercapnic RF--but [name] normal.  May eventually need nocturnal noninvasive ventilation.  Ultimately, if progresses enough pt will need trach.  . Constipation, chronic    Has caused irritative  urinary/bladder symptoms in the past.  . Depression   . Dizziness 06/2011+   Noncontrast CT and noncontrast MRI both negative 06/2011 at [facility] ED visit.  . Elevated transaminase level    Liver biopsy revealed fatty liver  . Erectile dysfunction   . Facial fracture ([place]) 06/2011   Nondisplaced bilateral [place] I fracture: no surgery required. (ENT, Dr. Wilburn Cornelia)   . Fatty liver disease, nonalcoholic   . GERD (gastroesophageal reflux disease)   . History of thyroid cancer 2010   Medullary carcinoma of thyroid. Thyroidectomy 08/2009 (Dr. Aida Puffer at [facility]).  Needs periodic serum calcitonin monitoring.  Marland Kitchen Hyperlipidemia   . Hypothyroidism   . LVH (left ventricular hypertrophy)    by EKG criteria 06/2011.  Hx of heart murmur--Followed by cardiology at [facility]: EF's have been "stable" per cardio office note 12/29/10.  His echo 12/2012 showed normal LV size and wall thickness but EF 45-50% and global LV hypokinesis was found.  . Myotonic dystrophy ([place]) Dx'd 1996   Progressive muscle weakness and swallowing dysfunction.  Initial dx by Dr. Jannifer Franklin, neurologist, but subsequent annual specialist f/u has been with Dr. Ladon Applebaum [facility].  Progressive debilitation--PT referral 04/2017, pt quit PT 05/2017.  . Nephrolithiasis   . Obesity, Class I, BMI 30-34.9   . OSA (obstructive sleep apnea)    SEVERE 08/2019 sleep study). CPAP to be initiated as of 04/18/20 pulm (Dr. Annamaria Boots)  . Pulmonary nodule, right 05/2017   RUL, 7 mm sub solid nodule.  Resolution noted on f/u CT 08/2017.    Past Surgical History:  Procedure Laterality Date  . COLONOSCOPY  09/13/2007   normal (Dr. Deatra Ina).  . EXTRACORPOREAL SHOCK WAVE LITHOTRIPSY    .  Home sleep study  08/31/2019   Severe OSA  . THYROIDECTOMY  08/2009   for medullary thyroid cancer (Dr. Conley Canal at [facility] follows him for this).  . TRANSTHORACIC ECHOCARDIOGRAM  12/2012; 09/2017   2014-Normal LV size and wall thickness, LV EF 45-50%, with global hypokinesis of LV.  2018-  EF 50-55%, normal wall motion, grd I DD, valves normal.     reports that he has never smoked. He has never used smokeless tobacco. He reports current alcohol use. He reports that he does not use drugs.  No Known Allergies  Family History  Problem Relation Age of Onset  . Arthritis Mother   . Hypertension Mother   . Alcohol abuse Father   . Arthritis Father   . Hypertension Father   . Colon cancer Father   . Prostate cancer Father   . Heart disease Brother      Prior to Admission medications   Medication Sig Start Date End Date Taking? Authorizing Provider  acetaminophen (TYLENOL) 500 MG tablet Take 1,000 mg by mouth at bedtime.   Yes [provider]  amphetamine-dextroamphetamine (ADDERALL XR) 10 MG 24 hr capsule TAKE 1 CAPSULE BY MOUTH ONCE DAILY (TO BE TAKEN WITH 30 MG XR CAPS) Patient taking differently: Take 10 mg by mouth daily. 04/16/21 10/13/21 Yes [name], [name], MD  amphetamine-dextroamphetamine (ADDERALL XR) 30 MG 24 hr capsule TAKE 1 CAPSULE BY MOUTH ONCE DAILY Patient taking differently: Take 30 mg by mouth daily. 04/16/21 10/13/21 Yes [name], [name], MD  clotrimazole-betamethasone (LOTRISONE) cream APPLY TO THE AFFECTED AREA(S) TWICE DAILY AS NEEDED Patient taking differently: Apply 1 application topically 2 (two) times daily as needed (to affected areas). 09/07/20  Yes [name], [name], MD  FLUoxetine (PROZAC) 40 MG capsule TAKE 1 CAPSULE BY MOUTH ONCE DAILY Patient taking differently: Take 40 mg by mouth daily. 03/08/21 03/08/22 Yes [name], [name], MD  glycopyrrolate (ROBINUL) 2 MG tablet TAKE 1/2 TO 1 TABLET BY MOUTH TWO TIMES DAILY AS NEEDED FOR EXCESSIVE ORAL SECRETIONS Patient taking differently: Take 1-2 mg by mouth See admin instructions. Take 2 mg by mouth at bedtime and an additional 1-2 mg once a day as needed FOR EXCESSIVE ORAL SECRETIONS 09/21/20  Yes [name], [name], MD  levothyroxine (SYNTHROID) 150 MCG tablet TAKE 1 TABLET BY MOUTH ONCE  DAILY EXCEPT FOR SUNDAY SKIP DOSE Patient taking differently: Take 150 mcg by mouth See admin instructions. Take 150 mcg by mouth in the morning before breakfast on Mon/Tues/Wed/Thurs/Fri/Sat- skip Sundays 02/18/21 02/18/22 Yes [name], [name], MD  Melatonin 10 MG CAPS Take 10 mg by mouth at bedtime.   Yes [provider]  omeprazole (PRILOSEC) 40 MG capsule TAKE 1 CAPSULE BY MOUTH ONCE DAILY Patient taking differently: Take 40 mg by mouth daily before breakfast. 06/01/20 06/01/21 Yes [name], [name], MD  ondansetron (ZOFRAN) 8 MG tablet Take 1 tablet (8 mg total) by mouth every 8 (eight) hours as needed for nausea or vomiting. 07/25/19  Yes [name], [name], MD  Simethicone 125 MG TABS Take 125 mg by mouth every 6 (six) hours as needed for flatulence.   Yes [provider]  traZODone (DESYREL) 100 MG tablet Take 2 tablets by mouth daily at bedtime. Patient taking differently: Take 200 mg by mouth at bedtime. 03/29/21  Yes [name], [name], MD  acetaminophen (TYLENOL) 325 MG tablet Take 1-2 tablets (325-650 mg total) by mouth every 4 (four) hours as needed for mild pain. Patient not taking: No sig  reported 11/06/18   Love, Ivan Anchors, PA-C  chlorhexidine (PERIDEX) 0.12 % solution USE 15ML TO SWISH FOR 2 FULL MINUTES THEN SPIT OUT--USE TWICE DAILY Patient not taking: No sig reported 12/21/20 12/21/21  Feliberto Harts R  COVID-19 mRNA vaccine, Pfizer, 30 MCG/0.3ML injection INJECT AS DIRECTED Patient not taking: Reported on 05/12/2021 12/11/20 12/11/21  Carlyle Basques, MD  HYDROcodone-acetaminophen (NORCO/VICODIN) 5-325 MG tablet TAKE 1 TABLET BY MOUTH EVERY 4 TO 6 HOURS AS NEEDED FOR PAIN. Patient not taking: No sig reported 12/09/20 06/07/21  Feliberto Harts R  ibuprofen (ADVIL) 600 MG tablet Take 1 tablet (600 mg total) by mouth every 6 (six) hours as needed. Patient not taking: No sig reported 04/01/21   Melynda Ripple, MD  penicillin v potassium (VEETID) 500 MG tablet TAKE 1 TABLET BY  MOUTH 4 TIMES DAILY UNTIL FINISHED Patient not taking: No sig reported 12/09/20 12/09/21  Margarette Canada    Physical Exam: Vitals:   05/12/21 1530 05/12/21 1545 05/12/21 1600 05/12/21 1623  BP: 113/75 108/62 112/64 103/88  Pulse: 84 89    Resp: 16 16 (!) 21 16  Temp:      SpO2: 98% 99%      Constitutional: NAD, calm, comfortable Vitals:   05/12/21 1530 05/12/21 1545 05/12/21 1600 05/12/21 1623  BP: 113/75 108/62 112/64 103/88  Pulse: 84 89    Resp: 16 16 (!) 21 16  Temp:      SpO2: 98% 99%     Eyes: PERRL, lids and conjunctivae normal ENMT: Mucous membranes are dry. Posterior pharynx clear of any exudate or lesions.Normal dentition.  Neck: normal, supple, no masses, no thyromegaly Respiratory: clear to auscultation bilaterally, no wheezing, no crackles. Normal respiratory effort. No accessory muscle use.  Cardiovascular: Regular rate and rhythm, no murmurs / rubs / gallops. No extremity edema. 2+ pedal pulses. No carotid bruits.  Abdomen: no tenderness, no masses palpated. No hepatosplenomegaly. Bowel sounds positive.  Musculoskeletal: no clubbing / cyanosis. No joint deformity upper and lower extremities. Good ROM, no contractures. Normal muscle tone.  Skin: no rashes, lesions, ulcers. No induration Neurologic: CN 2-12 grossly intact. Sensation intact, DTR normal. Strength 5/5 in all 4.  Psychiatric: Normal judgment and insight. Alert and oriented x 3. Normal mood.     Labs on Admission: I have personally reviewed following labs and imaging studies  CBC: Recent Labs  Lab 05/12/21 1415 05/12/21 1427  WBC 5.1  --   NEUTROABS 3.4  --   HGB 14.9 16.0  16.3  HCT 49.3 47.0  48.0  MCV 100.4*  --   PLT 145*  --    Basic Metabolic Panel: Recent Labs  Lab 05/12/21 1415 05/12/21 1427  NA 139 139  139  K 5.4* 4.3  4.3  CL 99 99  CO2 34*  --   GLUCOSE 92 95  BUN 12 13  CREATININE 0.96 0.80  CALCIUM 9.1  --    GFR: CrCl cannot be calculated (Unknown ideal  weight.). Liver Function Tests: Recent Labs  Lab 05/12/21 1415  AST 48*  ALT 36  ALKPHOS 63  BILITOT 1.2  PROT 6.8  ALBUMIN 3.5   No results for input(s): LIPASE, AMYLASE in the last 168 hours. No results for input(s): AMMONIA in the last 168 hours. Coagulation Profile: Recent Labs  Lab 05/12/21 1415  INR 1.1   Cardiac Enzymes: No results for input(s): CKTOTAL, CKMB, CKMBINDEX, TROPONINI in the last 168 hours. BNP (last 3 results) No results for input(s): PROBNP  in the last 8760 hours. HbA1C: No results for input(s): HGBA1C in the last 72 hours. CBG: No results for input(s): GLUCAP in the last 168 hours. Lipid Profile: No results for input(s): CHOL, HDL, LDLCALC, TRIG, CHOLHDL, LDLDIRECT in the last 72 hours. Thyroid Function Tests: No results for input(s): TSH, T4TOTAL, FREET4, T3FREE, THYROIDAB in the last 72 hours. Anemia Panel: No results for input(s): VITAMINB12, FOLATE, FERRITIN, TIBC, IRON, RETICCTPCT in the last 72 hours. Urine analysis:    Component Value Date/Time   COLORURINE YELLOW 10/31/2018 0224   APPEARANCEUR CLEAR 10/31/2018 0224   LABSPEC 1.018 10/31/2018 0224   PHURINE 5.0 10/31/2018 0224   GLUCOSEU NEGATIVE 10/31/2018 0224   HGBUR MODERATE (A) 10/31/2018 0224   BILIRUBINUR NEGATIVE 10/31/2018 0224   KETONESUR NEGATIVE 10/31/2018 0224   PROTEINUR NEGATIVE 10/31/2018 0224   UROBILINOGEN 0.2 11/22/2007 2045   NITRITE NEGATIVE 10/31/2018 0224   LEUKOCYTESUR TRACE (A) 10/31/2018 0224    Radiological Exams on Admission: DG Chest Port 1 View  Result Date: 05/12/2021 CLINICAL DATA:  Shortness of breath and weakness. EXAM: PORTABLE CHEST 1 VIEW COMPARISON:  12/12/2018 and CT chest 09/03/2017. FINDINGS: Patient is slightly rotated. Trachea is midline. Heart size is accentuated by AP technique and low lung volumes. Streaky densities in the lung bases. No airspace consolidation or pleural fluid. IMPRESSION: Low lung volumes with bibasilar subsegmental  atelectasis or scarring. Electronically Signed   By: Lorin Picket M.D.   On: 05/12/2021 13:30    EKG: Independently reviewed.  Sinus tachycardia  Assessment/Plan Active Problems:   Aspiration pneumonia (HCC)   Aspiration into airway  (please populate well all problems here in Problem List. (For example, if patient is on BP meds at home and you resume or decide to hold them, it is a problem that needs to be her. Same for CAD, COPD, HLD and so on)  Acute, probably on chronic hypoxic and hypercapnic respiratory failure -Likely from a combination effect of aspiration and noncompliant with CPAP machine for his OSA -Continue Unasyn -Speech evaluation -Discussed importance of CPAP for his OSA, patient however maintaining his decision to not use CPAP at night.  Discussed with patient and wife at bedside regarding long-term prognosis, patient does not want intubation, but agreed with other life-saving measures if necessary including chest compression.  Resting tremors -Check TSH -May need to adjust Adderall doses, follow-up with outpatient neurology. -Check CK level  Dehydration -IV fluid then reevaluate.  Myotonic dystrophy -Several family members suffers from the conditions.  Hypothyroidism -Check TSH level, continue Synthroid.  Ambulation dysfunction -PT evaluation  DVT prophylaxis: Lovenox  code Status: Full Code Family Communication: Wife at bedside Disposition Plan: Expect more than 2 midnight hospital stay Consults called: NOne Admission status: MedSurg admit   Lequita Halt MD [facility] Pager [phone]  05/12/2021, 5:25 PM

## 2021-05-12 NOTE — ED Notes (Signed)
Unsuccessful attempts of IV access by 2 RNs. IV team cx ordered.

## 2021-05-12 NOTE — ED Triage Notes (Signed)
Pt BIB EMS for generalized weakness, thick mucous that is unable to come up or be suctioned and "not acting like himself" per family since yesterday. Hx of muscular dystrophy and chronic dysphagia. Pt denies pain or dyspnea. Pt oriented x 4 on arrival.

## 2021-05-12 NOTE — Progress Notes (Signed)
RT note. Patient placed on bipap per order/co2 value. 17/5 R25 40%

## 2021-05-12 NOTE — Progress Notes (Addendum)
Lab notified this RN of critical [name] results. Results paged to on call via [place]. Will await call back or new orders.  2242: Dr. Tonie Griffith returned call and placed new order for bipap. Will continue to monitor pt.

## 2021-05-12 NOTE — ED Notes (Signed)
IV team at bedside to attempt IV insertion.

## 2021-05-12 NOTE — ED Notes (Signed)
Report attempted rn to attempt again  

## 2021-05-12 NOTE — ED Provider Notes (Signed)
[facility] EMERGENCY DEPARTMENT Provider Note   CSN: [id number] Arrival date & time: 05/12/21  1139     History Chief Complaint  Patient presents with  . Weakness  . Altered Mental Status    [name] is a 60 y.o. male.  60 year old male with prior medical history as detailed below presents for evaluation.  Patient with generalized weakness and increased secretions.  Patient denies pain. Patient with clearly secretions present in his upper airway.  He has a history of myotonic dystrophy.  He has a history of prior aspiration events.  Patient appears to be oxygenating well.  He denies feeling short of breath.  The history is provided by the patient and medical records.  Weakness Severity:  Mild Onset quality:  Sudden Duration:  1 day Timing:  Rare Progression:  Unchanged Chronicity:  New Relieved by:  Nothing Worsened by:  Nothing Altered Mental Status Associated symptoms: weakness        Past Medical History:  Diagnosis Date  . Adult ADHD   . Aspiration pneumonitis ([place]) 04/2017, 08/2017   Recurrent  . Chronic respiratory failure ([place])    As of 04/2018, Dr. Chase Caller suspects hypercapnic RF--but [name] normal.  May eventually need nocturnal noninvasive ventilation.  Ultimately, if progresses enough pt will need trach.  . Constipation, chronic    Has caused irritative urinary/bladder symptoms in the past.  . Depression   . Dizziness 06/2011+   Noncontrast CT and noncontrast MRI both negative 06/2011 at [facility] ED visit.  . Elevated transaminase level    Liver biopsy revealed fatty liver  . Erectile dysfunction   . Facial fracture ([place]) 06/2011   Nondisplaced bilateral [place] I fracture: no surgery required. (ENT, Dr. Wilburn Cornelia)   . Fatty liver disease, nonalcoholic   . GERD (gastroesophageal reflux disease)   . History of thyroid cancer 2010   Medullary carcinoma of thyroid. Thyroidectomy 08/2009 (Dr. Aida Puffer at [facility]).  Needs periodic serum  calcitonin monitoring.  Marland Kitchen Hyperlipidemia   . Hypothyroidism   . LVH (left ventricular hypertrophy)    by EKG criteria 06/2011.  Hx of heart murmur--Followed by cardiology at [facility]: EF's have been "stable" per cardio office note 12/29/10.  His echo 12/2012 showed normal LV size and wall thickness but EF 45-50% and global LV hypokinesis was found.  . Myotonic dystrophy ([place]) Dx'd 1996   Progressive muscle weakness and swallowing dysfunction.  Initial dx by Dr. Jannifer Franklin, neurologist, but subsequent annual specialist f/u has been with Dr. Ladon Applebaum [facility].  Progressive debilitation--PT referral 04/2017, pt quit PT 05/2017.  . Nephrolithiasis   . Obesity, Class I, BMI 30-34.9   . OSA (obstructive sleep apnea)    SEVERE 08/2019 sleep study). CPAP to be initiated as of 04/18/20 pulm (Dr. Annamaria Boots)  . Pulmonary nodule, right 05/2017   RUL, 7 mm sub solid nodule.  Resolution noted on f/u CT 08/2017.    Patient Active Problem List   Diagnosis Date Noted  . OSA (obstructive sleep apnea) 09/06/2019  . Myotonic dystrophy ([place])   . Abnormal chest x-ray   . Abnormal CT scan of lung   . Debility   . Leukopenia   . Encephalopathy 11/02/2018  . Acute metabolic encephalopathy 40/34/7425  . Pulmonary nodule 10/31/2018  . AKI (acute kidney injury) ([place]) 10/31/2018  . Generalized weakness 10/31/2018  . Fall 10/31/2018  . Weakness   . Aspiration pneumonia ([place]) 05/09/2017  . History of fall 05/09/2017  . Viral gastroenteritis 03/13/2014  .  Autonomic dysfunction 09/05/2013  . Tachycardia 02/20/2013  . ADD (attention deficit disorder) 08/15/2012  . Depression 08/15/2012  . Carcinoma of thyroid gland ([place]) 01/23/2012  . Nonalcoholic fatty liver disease 01/23/2012  . History of thyroid cancer   . Dizziness, nonspecific 12/07/2011  . Hypothyroidism 12/07/2011  . DEPRESSION 03/20/2008  . Myotonic muscular dystrophy ([place]) 03/20/2008  . GERD 03/20/2008    Past Surgical History:  Procedure Laterality Date  .  COLONOSCOPY  09/13/2007   normal (Dr. Deatra Ina).  . EXTRACORPOREAL SHOCK WAVE LITHOTRIPSY    . Home sleep study  08/31/2019   Severe OSA  . THYROIDECTOMY  08/2009   for medullary thyroid cancer (Dr. Conley Canal at [facility] follows him for this).  . TRANSTHORACIC ECHOCARDIOGRAM  12/2012; 09/2017   2014-Normal LV size and wall thickness, LV EF 45-50%, with global hypokinesis of LV.  2018- EF 50-55%, normal wall motion, grd I DD, valves normal.       Family History  Problem Relation Age of Onset  . Arthritis Mother   . Hypertension Mother   . Alcohol abuse Father   . Arthritis Father   . Hypertension Father   . Colon cancer Father   . Prostate cancer Father   . Heart disease Brother     Social History   Tobacco Use  . Smoking status: Never Smoker  . Smokeless tobacco: Never Used  Vaping Use  . Vaping Use: Never used  Substance Use Topics  . Alcohol use: Yes    Comment: rare beer  . Drug use: No    Home Medications Prior to Admission medications   Medication Sig Start Date End Date Taking? Authorizing Provider  acetaminophen (TYLENOL) 500 MG tablet Take 1,000 mg by mouth at bedtime.   Yes [provider]  amphetamine-dextroamphetamine (ADDERALL XR) 10 MG 24 hr capsule TAKE 1 CAPSULE BY MOUTH ONCE DAILY (TO BE TAKEN WITH 30 MG XR CAPS) Patient taking differently: Take 10 mg by mouth daily. 04/16/21 10/13/21 Yes [name], [name], MD  amphetamine-dextroamphetamine (ADDERALL XR) 30 MG 24 hr capsule TAKE 1 CAPSULE BY MOUTH ONCE DAILY Patient taking differently: Take 30 mg by mouth daily. 04/16/21 10/13/21 Yes [name], [name], MD  clotrimazole-betamethasone (LOTRISONE) cream APPLY TO THE AFFECTED AREA(S) TWICE DAILY AS NEEDED Patient taking differently: Apply 1 application topically 2 (two) times daily as needed (to affected areas). 09/07/20  Yes [name], [name], MD  FLUoxetine (PROZAC) 40 MG capsule TAKE 1 CAPSULE BY MOUTH ONCE DAILY Patient taking differently: Take 40 mg by  mouth daily. 03/08/21 03/08/22 Yes [name], [name], MD  glycopyrrolate (ROBINUL) 2 MG tablet TAKE 1/2 TO 1 TABLET BY MOUTH TWO TIMES DAILY AS NEEDED FOR EXCESSIVE ORAL SECRETIONS Patient taking differently: Take 1-2 mg by mouth See admin instructions. Take 2 mg by mouth at bedtime and an additional 1-2 mg once a day as needed FOR EXCESSIVE ORAL SECRETIONS 09/21/20  Yes [name], [name], MD  levothyroxine (SYNTHROID) 150 MCG tablet TAKE 1 TABLET BY MOUTH ONCE DAILY EXCEPT FOR SUNDAY SKIP DOSE Patient taking differently: Take 150 mcg by mouth See admin instructions. Take 150 mcg by mouth in the morning before breakfast on Mon/Tues/Wed/Thurs/Fri/Sat- skip Sundays 02/18/21 02/18/22 Yes [name], [name], MD  Melatonin 10 MG CAPS Take 10 mg by mouth at bedtime.   Yes [provider]  omeprazole (PRILOSEC) 40 MG capsule TAKE 1 CAPSULE BY MOUTH ONCE DAILY Patient taking differently: Take 40 mg by mouth daily before breakfast. 06/01/20 06/01/21 Yes [name],  [name], MD  ondansetron (ZOFRAN) 8 MG tablet Take 1 tablet (8 mg total) by mouth every 8 (eight) hours as needed for nausea or vomiting. 07/25/19  Yes [name], [name], MD  Simethicone 125 MG TABS Take 125 mg by mouth every 6 (six) hours as needed for flatulence.   Yes [provider]  traZODone (DESYREL) 100 MG tablet Take 2 tablets by mouth daily at bedtime. Patient taking differently: Take 200 mg by mouth at bedtime. 03/29/21  Yes [name], [name], MD  acetaminophen (TYLENOL) 325 MG tablet Take 1-2 tablets (325-650 mg total) by mouth every 4 (four) hours as needed for mild pain. Patient not taking: No sig reported 11/06/18   Love, Ivan Anchors, PA-C  chlorhexidine (PERIDEX) 0.12 % solution USE 15ML TO SWISH FOR 2 FULL MINUTES THEN SPIT OUT--USE TWICE DAILY Patient not taking: No sig reported 12/21/20 12/21/21  Feliberto Harts R  COVID-19 mRNA vaccine, Pfizer, 30 MCG/0.3ML injection INJECT AS DIRECTED Patient not taking: Reported on 05/12/2021  12/11/20 12/11/21  Carlyle Basques, MD  HYDROcodone-acetaminophen (NORCO/VICODIN) 5-325 MG tablet TAKE 1 TABLET BY MOUTH EVERY 4 TO 6 HOURS AS NEEDED FOR PAIN. Patient not taking: No sig reported 12/09/20 06/07/21  Feliberto Harts R  ibuprofen (ADVIL) 600 MG tablet Take 1 tablet (600 mg total) by mouth every 6 (six) hours as needed. Patient not taking: No sig reported 04/01/21   Melynda Ripple, MD  penicillin v potassium (VEETID) 500 MG tablet TAKE 1 TABLET BY MOUTH 4 TIMES DAILY UNTIL FINISHED Patient not taking: No sig reported 12/09/20 12/09/21  Feliberto Harts R    Allergies    Patient has no known allergies.  Review of Systems   Review of Systems  Neurological: Positive for weakness.  All other systems reviewed and are negative.   Physical Exam Updated Vital Signs BP 124/81   Pulse 84   Temp 98 F (36.7 C)   Resp 18   SpO2 99%   Physical Exam Vitals and nursing note reviewed.  Constitutional:      General: He is not in acute distress.    Appearance: Normal appearance. He is well-developed.  HENT:     Head: Normocephalic and atraumatic.     Mouth/Throat:     Comments: Upper airway secretions present Eyes:     Conjunctiva/sclera: Conjunctivae normal.     Pupils: Pupils are equal, round, and reactive to light.  Cardiovascular:     Rate and Rhythm: Normal rate and regular rhythm.     Heart sounds: Normal heart sounds.  Pulmonary:     Effort: Pulmonary effort is normal. No respiratory distress.     Comments: Decrease BS at right base  Abdominal:     General: There is no distension.     Palpations: Abdomen is soft.     Tenderness: There is no abdominal tenderness.  Musculoskeletal:        General: No deformity. Normal range of motion.     Cervical back: Normal range of motion and neck supple.  Skin:    General: Skin is warm and dry.  Neurological:     General: No focal deficit present.     Mental Status: He is alert and oriented to person, place, and time.     ED  Results / Procedures / Treatments   Labs (all labs ordered are listed, but only abnormal results are displayed) Labs Reviewed  CBC WITH DIFFERENTIAL/PLATELET - Abnormal; Notable for the following components:      Result  Value   MCV 100.4 (*)    Platelets 145 (*)    All other components within normal limits  I-STAT CHEM 8, ED - Abnormal; Notable for the following components:   TCO2 33 (*)    All other components within normal limits  I-STAT VENOUS BLOOD GAS, ED - Abnormal; Notable for the following components:   pCO2, Ven 72.8 (*)    Bicarbonate 35.0 (*)    TCO2 37 (*)    Acid-Base Excess 5.0 (*)    All other components within normal limits  RESP PANEL BY RT-PCR (FLU A&B, COVID) ARPGX2  CULTURE, BLOOD (ROUTINE X 2)  CULTURE, BLOOD (ROUTINE X 2)  PROTIME-INR  URINALYSIS, ROUTINE W REFLEX MICROSCOPIC  BLOOD GAS, VENOUS  LACTIC ACID, PLASMA  LACTIC ACID, PLASMA  BRAIN NATRIURETIC PEPTIDE  COMPREHENSIVE METABOLIC PANEL  TROPONIN I (HIGH SENSITIVITY)  TROPONIN I (HIGH SENSITIVITY)    EKG EKG Interpretation  Date/Time:  Wednesday May 12 2021 11:40:28 EDT Ventricular Rate:  106 PR Interval:  188 QRS Duration: 95 QT Interval:  350 QTC Calculation: 465 R Axis:   60 Text Interpretation: Sinus tachycardia Borderline T wave abnormalities Minimal ST elevation, lateral leads Confirmed by Dene Gentry [phone]) on 05/12/2021 11:46:34 AM   Radiology DG Chest Port 1 View  Result Date: 05/12/2021 CLINICAL DATA:  Shortness of breath and weakness. EXAM: PORTABLE CHEST 1 VIEW COMPARISON:  12/12/2018 and CT chest 09/03/2017. FINDINGS: Patient is slightly rotated. Trachea is midline. Heart size is accentuated by AP technique and low lung volumes. Streaky densities in the lung bases. No airspace consolidation or pleural fluid. IMPRESSION: Low lung volumes with bibasilar subsegmental atelectasis or scarring. Electronically Signed   By: Lorin Picket M.D.   On: 05/12/2021 13:30     Procedures Procedures   Medications Ordered in ED Medications  piperacillin-tazobactam (ZOSYN) IVPB 3.375 g (has no administration in time range)    ED Course  I have reviewed the triage vital signs and the nursing notes.  Pertinent labs & imaging results that were available during my care of the patient were reviewed by me and considered in my medical decision making (see chart for details).    MDM Rules/Calculators/A&P                          MDM  MSE complete  [name] was evaluated in Emergency Department on 05/12/2021 for the symptoms described in the history of present illness. He was evaluated in the context of the global COVID-19 pandemic, which necessitated consideration that the patient might be at risk for infection with the SARS-CoV-2 virus that causes COVID-19. Institutional protocols and algorithms that pertain to the evaluation of patients at risk for COVID-19 are in a state of rapid change based on information released by regulatory bodies including the CDC and federal and state organizations. These policies and algorithms were followed during the patient's care in the ED.   Patient with hx of myotonic dystrophy and prior aspiration. He presents with increased secretions and weakness.   Presentation is concerning for possible recurrent aspiration.  Zosyn initiated in ED.   Patient would benefit from inpatient admission/observation.     Final Clinical Impression(s) / ED Diagnoses Final diagnoses:  Dyspnea, unspecified type    Rx / DC Orders ED Discharge Orders    None       Valarie Merino, MD 05/12/21 1542

## 2021-05-12 NOTE — Progress Notes (Signed)
Pharmacy Antibiotic Note  [name] is a 60 y.o. male admitted on 05/12/2021 with aspiration PNA.  Pharmacy has been consulted for zosyn dosing.  Presenting with generalized weakness and increased secretions - has hx myotonic dystrophy. He has had aspiration episodes in the past. Scr 0.8. Afebrile.   Plan: Zosyn 3.375g IV q8h (4 hour infusion). Monitor renal fx, cx results, clinical pic, and LOT    Temp (24hrs), Avg:97.7 F (36.5 C), Min:97.4 F (36.3 C), Max:98 F (36.7 C)  Recent Labs  Lab 05/12/21 1427  CREATININE 0.80    CrCl cannot be calculated (Unknown ideal weight.).    No Known Allergies  Antimicrobials this admission: Zosyn 5/18 >>   Dose adjustments this admission: N/A  Microbiology results: 5/18 BCx: sent 5/18 COVID PCR: sent  Thank you for allowing pharmacy to be a part of this patient's care.  Antonietta Jewel, PharmD, [facility] Clinical Pharmacist  Phone: [phone] 05/12/2021 2:45 PM  Please check AMION for all [facility] phone numbers After 10:00 PM, call [facility] [place] [phone]

## 2021-05-12 NOTE — Progress Notes (Signed)
Pharmacy Antibiotic Note  [name] is a 59 y.o. male admitted on 05/12/2021 with aspiration pneumonia.  Pharmacy has been consulted for Unasyn dosing. Presenting today with generalized weakness and increased secretion. Patient PMH significant for mytonic dystrophy and other aspiration events in the past.   Plan: Unasyn 3G Q8H  Monitor renal function, clinical status, and treatment plan      Temp (24hrs), Avg:97.7 F (36.5 C), Min:97.4 F (36.3 C), Max:98 F (36.7 C)  Recent Labs  Lab 05/12/21 1415 05/12/21 1427  WBC 5.1  --   CREATININE 0.96 0.80    CrCl cannot be calculated (Unknown ideal weight.).    No Known Allergies  Antimicrobials this admission: 5/18 Zosyn x1    Dose adjustments this admission: NA  Microbiology results: 5/18 BCx:    Thank you for allowing pharmacy to be a part of this patient's care.  Llana Aliment 05/12/2021 5:33 PM

## 2021-05-12 NOTE — ED Notes (Signed)
2x attempts at blood draw taken w/o success. MD to be notified.

## 2021-05-13 ENCOUNTER — Inpatient Hospital Stay (HOSPITAL_COMMUNITY): Payer: 59

## 2021-05-13 LAB — BASIC METABOLIC PANEL
Anion gap: 9 (ref 5–15)
BUN: 10 mg/dL (ref 6–20)
CO2: 30 mmol/L (ref 22–32)
Calcium: 9.1 mg/dL (ref 8.9–10.3)
Chloride: 100 mmol/L (ref 98–111)
Creatinine, Ser: 0.89 mg/dL (ref 0.61–1.24)
GFR, Estimated: >60 mL/min (ref >60)
Glucose, Bld: 82 mg/dL (ref 70–99)
Potassium: 4.2 mmol/L (ref 3.5–5.1)
Sodium: 139 mmol/L (ref 135–145)

## 2021-05-13 LAB — BLOOD GAS, VENOUS
Acid-Base Excess: 7 mmol/L — ABNORMAL HIGH (ref 0.0–2.0)
Bicarbonate: 30.7 mmol/L — ABNORMAL HIGH (ref 20.0–28.0)
Drawn by: 5915
O2 Saturation: 97.3 %
Patient temperature: 37
pCO2, Ven: 41 mmHg — ABNORMAL LOW (ref 44.0–60.0)
pH, Ven: 7.487 — ABNORMAL HIGH (ref 7.250–7.430)
pO2, Ven: 147 mmHg — ABNORMAL HIGH (ref 32.0–45.0)

## 2021-05-13 MED ORDER — ACETAMINOPHEN 325 MG PO TABS
650.0000 mg | ORAL_TABLET | Freq: Four times a day (QID) | ORAL | Status: DC | PRN
  Filled 2021-05-13: qty 2

## 2021-05-13 MED ORDER — SODIUM CHLORIDE 0.9 % IV SOLN: INTRAVENOUS | Status: DC

## 2021-05-13 MED ORDER — ALBUTEROL SULFATE (2.5 MG/3ML) 0.083% IN NEBU: 2.5000 mg | INHALATION_SOLUTION | RESPIRATORY_TRACT | Status: DC | PRN

## 2021-05-13 NOTE — Evaluation (Signed)
Physical Therapy Evaluation Patient Details Name: [name] MRN: [id number] DOB: Nov 08, 1961 Today's Date: 05/13/2021   History of Present Illness  60 y.o. male presented with new onset of shortness of breath and hypoxia. +aspiration pneumonia requiring BiPAP  PMH- myotonic dystrophy, OSA, noncompliant with CPAP, hypothyroidism, chronic dysphagia, anxiety/depressio  Clinical Impression   Pt admitted secondary to problem above with deficits below. PTA patient was requiring 1 person assist for transfers and able to walk ~5 feet with RW (per wife). Pt currently requires 2 person assist to stand-pivot bed to chair and was not safe to ambulate. Wife reports his tremors are more severe than they have been. Anticipate pt can benefit from PT in a rehab setting to regain ability to transfer and walk with 1 person assist.  Will continue to follow acutely to maximize functional mobility independence and safety.       Follow Up Recommendations CIR    Equipment Recommendations  Other (comment) (wife requests hoyer lift for getting pt off the floor when he falls)    Recommendations for Other Services Rehab consult;OT consult     Precautions / Restrictions Precautions Precautions: Fall Precaution Comments: has fallen at home; son can usually get him up but wife cannot      Mobility  Bed Mobility Overal bed mobility: Needs Assistance Bed Mobility: Rolling;Sidelying to Sit Rolling: Mod assist Sidelying to sit: Mod assist       General bed mobility comments: pt trying to come up straight from supine and reaching hand out to be pulled up; educated wife/pt on him doing as much as he can to keep up his strength and educated on rolling and side to sit    Transfers Overall transfer level: Needs assistance Equipment used: Rolling walker (2 wheeled);2 person hand held assist Transfers: Sit to/from [facility] Sit to Stand: Mod assist Stand pivot transfers: Mod assist;+2  physical assistance       General transfer comment: comes to stand with feet >20" apart (despite cues and physically positioning them closer together); assist for ant weight shift and then extending into upright; pivotal steps to his left to recliner with wife also assisting  Ambulation/Gait                Stairs            Wheelchair Mobility    Modified Rankin (Stroke Patients Only)       Balance Overall balance assessment: Needs assistance Sitting-balance support: No upper extremity supported;Feet supported Sitting balance-Leahy Scale: Fair     Standing balance support: Single extremity supported Standing balance-Leahy Scale: Poor Standing balance comment: requires UE support                             Pertinent Vitals/Pain Pain Assessment: No/denies pain    Home Living Family/patient expects to be discharged to:: Private residence Living Arrangements: Spouse/significant other Available Help at Discharge: Family;Available 24 hours/day Type of Home: House Home Access: Ramped entrance     Home Layout: One level Home Equipment: Walker - 2 wheels;Wheelchair - power;Hospital bed;Grab bars - toilet Additional Comments: stair lift    Prior Function Level of Independence: Needs assistance   Gait / Transfers Assistance Needed: transfers with assist (likes to pull up on wife's hand or walker); has to walk 2 steps into bathroom to get to toilet           Hand Dominance   Dominant Hand:  Right    Extremity/Trunk Assessment   Upper Extremity Assessment Upper Extremity Assessment: Defer to OT evaluation    Lower Extremity Assessment Lower Extremity Assessment: Generalized weakness (requires assist to stand)    Cervical / Trunk Assessment Cervical / Trunk Assessment: Kyphotic  Communication   Communication: Expressive difficulties  Cognition Arousal/Alertness: Awake/alert Behavior During Therapy: Flat affect Overall Cognitive Status:  Difficult to assess                                 General Comments: required hand over hand guidance for where to place his hands to push/scoot out to EOB and again for pushing off mattress to come to stand      General Comments General comments (skin integrity, edema, etc.): wife present throughtout and reports pt is weaker than usual and his tremors are worse.    Exercises     Assessment/Plan    PT Assessment Patient needs continued PT services  PT Problem List Decreased strength;Decreased activity tolerance;Decreased balance;Decreased mobility;Decreased coordination;Decreased cognition;Decreased knowledge of use of DME       PT Treatment Interventions DME instruction;Gait training;Functional mobility training;Therapeutic activities;Therapeutic exercise;Balance training;Neuromuscular re-education;Cognitive remediation;Patient/family education    PT Goals (Current goals can be found in the Care Plan section)  Acute Rehab PT Goals Patient Stated Goal: wife wants pt strong enough to transfer and walk 2 steps into bathroom with her assist PT Goal Formulation: With patient/family Time For Goal Achievement: 05/27/21 Potential to Achieve Goals: Fair    Frequency Min 3X/week   Barriers to discharge        Co-evaluation               AM-PAC PT "6 Clicks" Mobility  Outcome Measure Help needed turning from your back to your side while in a flat bed without using bedrails?: A Lot Help needed moving from lying on your back to sitting on the side of a flat bed without using bedrails?: A Lot Help needed moving to and from a bed to a chair (including a wheelchair)?: Total Help needed standing up from a chair using your arms (e.g., wheelchair or bedside chair)?: Total Help needed to walk in hospital room?: Total Help needed climbing 3-5 steps with a railing? : Total 6 Click Score: 8    End of Session Equipment Utilized During Treatment: Gait belt;Oxygen Activity  Tolerance: Patient limited by fatigue Patient left: in chair;with call bell/phone within reach;with family/visitor present (unable to reach chair alarm; wife present and will not let pt try to get up alone) Nurse Communication: Mobility status PT Visit Diagnosis: Other abnormalities of gait and mobility (R26.89);Muscle weakness (generalized) (M62.81);Other symptoms and signs involving the nervous system (R29.898)    Time: 4481-8563 PT Time Calculation (min) (ACUTE ONLY): 41 min   Charges:   PT Evaluation $PT Eval Moderate Complexity: 1 Mod PT Treatments $Therapeutic Activity: 23-37 mins         Arby Barrette, PT Pager [phone]   Rexanne Mano 05/13/2021, 4:35 PM

## 2021-05-13 NOTE — Progress Notes (Signed)
RT note. Patient taken off bipap at this time per venous results,  patient sat 99%  on rm air with stable VS. [place] set up in case patient needs oxygen, RN aware. RT will continue to monitor.

## 2021-05-13 NOTE — Evaluation (Signed)
Clinical/Bedside Swallow Evaluation Patient Details  Name: [name] MRN: [id number] Date of Birth: November 02, 1961  Today's Date: 05/13/2021 Time: SLP Start Time (ACUTE ONLY): 0919 SLP Stop Time (ACUTE ONLY): 0954 SLP Time Calculation (min) (ACUTE ONLY): 35.73 min  Past Medical History:  Past Medical History:  Diagnosis Date  . Adult ADHD   . Aspiration pneumonitis ([place]) 04/2017, 08/2017   Recurrent  . Chronic respiratory failure ([place])    As of 04/2018, Dr. Chase Caller suspects hypercapnic RF--but [name] normal.  May eventually need nocturnal noninvasive ventilation.  Ultimately, if progresses enough pt will need trach.  . Constipation, chronic    Has caused irritative urinary/bladder symptoms in the past.  . Depression   . Dizziness 06/2011+   Noncontrast CT and noncontrast MRI both negative 06/2011 at [facility] ED visit.  . Elevated transaminase level    Liver biopsy revealed fatty liver  . Erectile dysfunction   . Facial fracture ([place]) 06/2011   Nondisplaced bilateral [place] I fracture: no surgery required. (ENT, Dr. Wilburn Cornelia)   . Fatty liver disease, nonalcoholic   . GERD (gastroesophageal reflux disease)   . History of thyroid cancer 2010   Medullary carcinoma of thyroid. Thyroidectomy 08/2009 (Dr. Aida Puffer at [facility]).  Needs periodic serum calcitonin monitoring.  Marland Kitchen Hyperlipidemia   . Hypothyroidism   . LVH (left ventricular hypertrophy)    by EKG criteria 06/2011.  Hx of heart murmur--Followed by cardiology at [facility]: EF's have been "stable" per cardio office note 12/29/10.  His echo 12/2012 showed normal LV size and wall thickness but EF 45-50% and global LV hypokinesis was found.  . 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  Initial dx by Dr. Jannifer Franklin, neurologist, but subsequent annual specialist f/u has been with Dr. Ladon Applebaum [facility].  Progressive debilitation--PT referral 04/2017, pt quit PT 05/2017.  . Nephrolithiasis   . Obesity,  Class I, BMI 30-34.9   . OSA (obstructive sleep apnea)    SEVERE 08/2019 sleep study). CPAP to be initiated as of 04/18/20 pulm (Dr. Annamaria Boots)  . Pulmonary nodule, right 05/2017   RUL, 7 mm sub solid nodule.  Resolution noted on f/u CT 08/2017.   Past Surgical History:  Past Surgical History:  Procedure Laterality Date  . COLONOSCOPY  09/13/2007   normal (Dr. Deatra Ina).  . EXTRACORPOREAL SHOCK WAVE LITHOTRIPSY    . Home sleep study  08/31/2019   Severe OSA  . THYROIDECTOMY  08/2009   for medullary thyroid cancer (Dr. Conley Canal at [facility] follows him for this).  . TRANSTHORACIC ECHOCARDIOGRAM  12/2012; 09/2017   2014-Normal LV size and wall thickness, LV EF 45-50%, with global hypokinesis of LV.  2018- EF 50-55%, normal wall motion, grd I DD, valves normal.   HPI:  Pt is a 60 y.o. male who presented with new onset of shortness of breath and hypoxia. Per H&P, has chronic dysphagia, secondary to myotonic dystrophy, he has been followed with speech therapist and recommended G-tubes, but pt has declined several times. MBS 09/27/17: moderate pharyngeal dysphagia marked by decreased ROM of pharyngeal/laryngeal musculature and potential weakness, leading to poor base-of-tongue to pharyngeal wall approximation, trace aspiration with larger boluses.  There is reduced inversion of epiglottis over laryngeal vestibule. A regular texture diety with thin liquids was recommended at that time. CXR 5/19: Low lung volumes with mild bibasilar atelectasis and or scarring  again noted. PMH: myotonic dystrophy, OSA, noncompliant with CPAP, hypothyroidism, chronic dysphagia, anxiety/depression   Assessment / Plan /  Recommendation Clinical Impression  Pt was seen for bedside swallow evaluation. He reported that he has been consuming regular texture solids and thin liquids, but avoids straws per prior SLP's recommendation. Pt denied any signs of aspiration at baseline. Facial and lingual weakness was noted during the oral mechanism  exam. Pt was seen during breakfast with a meal of oatmeal, pancakes, sausage, and thin liquids. Pt demonstrated symptoms of oropharyngeal dysphagia characterized by prolonged mastication, multiple swallows, and a progressively wet vocal quality as the meal progressed. A modified barium swallow study is recommended to further assess swallow function. SLP Visit Diagnosis: Dysphagia, unspecified (R13.10)    Aspiration Risk  Mild aspiration risk    Diet Recommendation Regular;Thin liquid   Liquid Administration via: Cup;No straw Medication Administration: Whole meds with liquid Supervision: Patient able to self feed Compensations: Slow rate;Small sips/bites    Other  Recommendations Oral Care Recommendations: Oral care BID   Follow up Recommendations  (TBD)      Frequency and Duration min 2x/week  2 weeks       Prognosis Prognosis for Safe Diet Advancement: Fair Barriers to Reach Goals: Time post onset      Swallow Study   General Date of Onset: 09/27/17 HPI: Pt is a 60 y.o. male who presented with new onset of shortness of breath and hypoxia. Per H&P, has chronic dysphagia, secondary to myotonic dystrophy, he has been followed with speech therapist and recommended G-tubes, but pt has declined several times. MBS 09/27/17: moderate pharyngeal dysphagia marked by decreased ROM of pharyngeal/laryngeal musculature and potential weakness, leading to poor base-of-tongue to pharyngeal wall approximation, trace aspiration with larger boluses.  There is reduced inversion of epiglottis over laryngeal vestibule. A regular texture diety with thin liquids was recommended at that time. CXR 5/19: Low lung volumes with mild bibasilar atelectasis and or scarring  again noted. PMH: myotonic dystrophy, OSA, noncompliant with CPAP, hypothyroidism, chronic dysphagia, anxiety/depression Type of Study: Bedside Swallow Evaluation Previous Swallow Assessment: See HPI Diet Prior to this Study: Regular;Thin  liquids Temperature Spikes Noted: No Respiratory Status: Nasal cannula History of Recent Intubation: No Behavior/Cognition: Alert;Cooperative;Pleasant mood Oral Cavity Assessment: Within Functional Limits Oral Care Completed by SLP: No Oral Cavity - Dentition: Adequate natural dentition Vision: Functional for self-feeding Self-Feeding Abilities: Able to feed self Patient Positioning: Upright in bed;Postural control adequate for testing Baseline Vocal Quality: Low vocal intensity Volitional Cough: Weak Volitional Swallow: Able to elicit    Oral/Motor/Sensory Function Overall Oral Motor/Sensory Function: Within functional limits   Ice Chips Ice chips: Within functional limits   Thin Liquid Thin Liquid: Impaired Presentation: Cup Pharyngeal  Phase Impairments: Multiple swallows    Nectar Thick Nectar Thick Liquid: Not tested   Honey Thick Honey Thick Liquid: Not tested   Puree Puree: Within functional limits Presentation: Spoon   Solid     Solid: Impaired Presentation: Self Fed Oral Phase Impairments: Impaired mastication Pharyngeal Phase Impairments: Wet Vocal Quality     Micheline Markes I. Hardin Negus, [place], [place] Office number [phone] Pager [phone]  Horton Marshall 05/13/2021,10:04 AM

## 2021-05-13 NOTE — Progress Notes (Signed)
  Speech Language Pathology Treatment: Dysphagia  Patient Details Name: [name] MRN: [id number] DOB: [birth year] Today's Date: 05/13/2021 Time: 5176-1607 SLP Time Calculation (min) (ACUTE ONLY): 13 min  Assessment / Plan / Recommendation Clinical Impression  Pt was seen for dysphagia treatment with his wife present. Pt was lethargic and demonstrated increased difficulty attending compared to prior sessions. Pt and his wife were educated regarding the results of the modified barium swallow study, diet recommendations, and swallowing precautions. Pt requested that viewing of video be deferred today, but pt's wife demonstrated understanding regarding the results of the study and the potential risk of aspiration-related complications. Both parties verbalized understanding, and pt was able to demonstrate a weak cough. However, considering his change in presentation compared to earlier and the pt's wife's report of increased confusion this evening, it is anticipated that reinforcement will be needed. P'ts wife reported that the pt demonstrated the cough after swallowing liquids ~50% of the time despite consistent cueing. It was agreed that use of thickened liquids may need to be considered at least initially if pt's consistency remains at this level with verbal prompting. SLP will continue to follow pt.    HPI HPI: Pt is a 60 y.o. male who presented with Robert onset of shortness of breath and hypoxia. Per H&P, has chronic dysphagia, secondary to myotonic dystrophy, he has been followed with speech therapist and recommended G-tubes, but pt has declined several times. MBS 09/27/17: moderate pharyngeal dysphagia marked by decreased ROM of pharyngeal/laryngeal musculature and potential weakness, leading to poor base-of-tongue to pharyngeal wall approximation, trace aspiration with larger boluses.  There is reduced inversion of epiglottis over laryngeal vestibule. A regular texture diety with thin liquids was  recommended at that time. CXR 5/19: Low lung volumes with mild bibasilar atelectasis and or scarring  again noted. PMH: myotonic dystrophy, OSA, noncompliant with CPAP, hypothyroidism, chronic dysphagia, anxiety/depression      SLP Plan  Continue with current plan of care       Recommendations  Diet recommendations: Dysphagia 3 (mechanical soft);Thin liquid Liquids provided via: Cup;No straw Medication Administration: Whole meds with puree Supervision: Full supervision/cueing for compensatory strategies;Patient able to self feed Compensations: Slow rate;Small sips/bites (Cough and swallow after intake of thin liquids) Postural Changes and/or Swallow Maneuvers: Seated upright 90 degrees;Upright 30-60 min after meal                Oral Care Recommendations: Oral care BID Follow up Recommendations:  (TBD) SLP Visit Diagnosis: Dysphagia, oropharyngeal phase (R13.12) Plan: Continue with current plan of care       [name], [name], [name] Office number [phone] Pager [phone]                [name] 05/13/2021, 6:08 PM

## 2021-05-13 NOTE — Progress Notes (Signed)
Modified Barium Swallow Progress Note  Patient Details  Name: [name] MRN: [id number] Date of Birth: July 27, 1961  Today's Date: 05/13/2021  Modified Barium Swallow completed.  Full report located under Chart Review in the Imaging Section.  Brief recommendations include the following:  Clinical Impression  Pt presents with oropharyngeal dysphagia characterized by weak bolus manipulation, prolonged mastication and reduction in lingual retraction, pharyngeal constriction, hyolaryngeal elevation, and anterior laryngeal movement. Epiglottic inversion was incomplete, but some airway protention was achieved by approximation of the arytenoids to the laryngeal surface of the epiglottis. Residue was noted in the valleculae, pyriform sinuses and on the posterior pharyngeal wall due to reduced pressure, but vallecular residue was most significant. Residue was improved to a more functional level with use of a liquid wash. Penetration (PAS 3,5) was consistently noted with thin liquids via cup and straw and intermittently resulted in trace silent aspiration (PAS 8). Laryngeal invasion was secondary to incomplete epiglottic inversion. No functional benefit was noted with postural modifications, but prompted coughing was succesful in expelling penetrated material and therefore avoiding subsequent aspiration. Laryngeal invasion (PAS 3,5) was less frequently noted with nectar thick liquids, but this resulted in increased pharyngeal residue which could increase aspiration risk after deglutition and prompted coughing was less effective in fully expelling penetrated material. A dysphagia 3 diet with thin liquids (via cup) is recommended at this time with strict observance of swallowing precautions and with full supervision to ensure observance of swallowing precautions. Esophageal screening again revealed stasis of barium in the cervical, and thoracic esophagus as was similarly noted in 2018. SLP recommends esophageal  assessment (e.g., esophagram) and/or GI consult.   Swallow Evaluation Recommendations       SLP Diet Recommendations: Dysphagia 3 (Mech soft) solids;Thin liquid   Liquid Administration via: Cup;No straw   Medication Administration: Whole meds with puree   Supervision: Patient able to self feed;Full supervision/cueing for compensatory strategies   Compensations: Slow rate;Small sips/bites (Cough and swallow after intake of thin liquids)   Postural Changes: Seated upright at 90 degrees;Remain semi-upright after after feeds/meals (Comment)   Oral Care Recommendations: Oral care BID       Amadu Schlageter I. Hardin Negus, [place], [place] Office number [phone] Pager [place] 05/13/2021,4:03 PM

## 2021-05-13 NOTE — Progress Notes (Signed)
PROGRESS NOTE    [name]  [id number] DOB: [birth year] DOA: 05/12/2021 PCP: Tammi Sou, MD    Brief Narrative:  60 y.o. male with medical history significant of myotonic dystrophy, OSA, noncompliant with CPAP, hypothyroidism, chronic dysphagia, anxiety/depression, presented with new onset of shortness of breath and hypoxia  Assessment & Plan:   Active Problems:   Aspiration pneumonia (HCC)   Aspiration into airway  Acute, probably on chronic hypoxic and hypercapnic respiratory failure -concerns were noted with aspiration and noncompliant with CPAP machine for his OSA -Pt is continued on Unasyn -Wean O2 as tolerated -SLP was consulted. Pt is now s/p MBS with findings notable for stasis of barium in cervical and thoracic esophagus with recs for esophogram vs GI consult -Continued on dysphagia 3 with thin liquids per SLP  Resting tremors -TSH of 0.098. Will check free T4 -May need to adjust Adderall doses, follow-up with outpatient neurology. -CK level is unremarkable  Dehydration -given IVF hydration, tolerated -Will continue with IVF for now  Myotonic dystrophy -Several family members suffers from the conditions, noted.  Hypothyroidism -TSH of 0.098 -Will check free T4  Ambulation dysfunction -PT evaluation placed at time of presentation  DVT prophylaxis: Lovenox subq Code Status: Partial Family Communication: Pt in room, family not at bedside  Status is: Inpatient  Remains inpatient appropriate because:Inpatient level of care appropriate due to severity of illness   Dispo: The patient is from: Home              Anticipated d/c is to: Home              Patient currently is not medically stable to d/c.   Difficult to place patient No       Consultants:     Procedures:     Antimicrobials: Anti-infectives (From admission, onward)   Start     Dose/Rate Route Frequency Ordered Stop   05/12/21 2200  Ampicillin-Sulbactam (UNASYN)  3 g in sodium chloride 0.9 % 100 mL IVPB        3 g 200 mL/hr over 30 Minutes Intravenous Every 8 hours 05/12/21 1737     05/12/21 1500  piperacillin-tazobactam (ZOSYN) IVPB 3.375 g  Status:  Discontinued        3.375 g 12.5 mL/hr over 240 Minutes Intravenous Every 8 hours 05/12/21 1447 05/12/21 1725   05/12/21 1230  piperacillin-tazobactam (ZOSYN) IVPB 3.375 g  Status:  Discontinued        3.375 g 100 mL/hr over 30 Minutes Intravenous  Once 05/12/21 1219 05/12/21 1447       Subjective: Denies complaints this AM. Does later admit to feeling dehydrated  Objective: Vitals:   05/13/21 0329 05/13/21 0400 05/13/21 0504 05/13/21 0600  BP:   121/65   Pulse:  78 70 71  Resp: (!) 26 20 20    Temp:   98 F (36.7 C)   TempSrc:   Oral   SpO2: 95% 98% 100% 96%    Intake/Output Summary (Last 24 hours) at 05/13/2021 1826 Last data filed at 05/13/2021 0526 Gross per 24 hour  Intake 1015.91 ml  Output --  Net 1015.91 ml   There were no vitals filed for this visit.  Examination: General exam: Awake, laying in bed, in nad, mucus membranes dry Respiratory system: Normal respiratory effort, no wheezing Cardiovascular system: regular rate, s1, s2 Gastrointestinal system: Soft, nondistended, positive BS Central nervous system: CN2-12 grossly intact, strength intact Extremities: Perfused, no clubbing Skin: Normal skin turgor, no notable  skin lesions seen Psychiatry: Mood normal // no visual hallucinations    Data Reviewed: I have personally reviewed following labs and imaging studies  CBC: Recent Labs  Lab 05/12/21 1415 05/12/21 1427  WBC 5.1  --   NEUTROABS 3.4  --   HGB 14.9 16.0  16.3  HCT 49.3 47.0  48.0  MCV 100.4*  --   PLT 145*  --    Basic Metabolic Panel: Recent Labs  Lab 05/12/21 1415 05/12/21 1427 05/13/21 0227  NA 139 139  139 139  K 5.4* 4.3  4.3 4.2  CL 99 99 100  CO2 34*  --  30  GLUCOSE 92 95 82  BUN 12 13 10   CREATININE 0.96 0.80 0.89  CALCIUM 9.1   --  9.1   GFR: CrCl cannot be calculated (Unknown ideal weight.). Liver Function Tests: Recent Labs  Lab 05/12/21 1415  AST 48*  ALT 36  ALKPHOS 63  BILITOT 1.2  PROT 6.8  ALBUMIN 3.5   No results for input(s): LIPASE, AMYLASE in the last 168 hours. No results for input(s): AMMONIA in the last 168 hours. Coagulation Profile: Recent Labs  Lab 05/12/21 1415  INR 1.1   Cardiac Enzymes: Recent Labs  Lab 05/12/21 1817  CKTOTAL 127   BNP (last 3 results) No results for input(s): PROBNP in the last 8760 hours. HbA1C: No results for input(s): HGBA1C in the last 72 hours. CBG: No results for input(s): GLUCAP in the last 168 hours. Lipid Profile: No results for input(s): CHOL, HDL, LDLCALC, TRIG, CHOLHDL, LDLDIRECT in the last 72 hours. Thyroid Function Tests: Recent Labs    05/12/21 1817  TSH 0.098*   Anemia Panel: No results for input(s): VITAMINB12, FOLATE, FERRITIN, TIBC, IRON, RETICCTPCT in the last 72 hours. Sepsis Labs: Recent Labs  Lab 05/12/21 1817 05/12/21 2156  LATICACIDVEN 1.5 3.5*    Recent Results (from the past 240 hour(s))  Culture, blood (routine x 2)     Status: None (Preliminary result)   Collection Time: 05/12/21 11:45 AM   Specimen: BLOOD  Result Value Ref Range Status   Specimen Description BLOOD LEFT ANTECUBITAL  Final   Special Requests   Final    BOTTLES DRAWN AEROBIC AND ANAEROBIC Blood Culture results may not be optimal due to an inadequate volume of blood received in culture bottles   Culture   Final    NO GROWTH < 24 HOURS Performed at [facility], [place] [address], [place],  [address]    Report Status PENDING  Incomplete  Resp Panel by RT-PCR (Flu A&B, Covid) Nasopharyngeal Swab     Status: None   Collection Time: 05/12/21 11:59 AM   Specimen: Nasopharyngeal Swab; Nasopharyngeal(NP) swabs in vial transport medium  Result Value Ref Range Status   SARS Coronavirus 2 by RT PCR NEGATIVE NEGATIVE Final    Comment:  (NOTE) SARS-CoV-2 target nucleic acids are NOT DETECTED.  The SARS-CoV-2 RNA is generally detectable in upper respiratory specimens during the acute phase of infection. The lowest concentration of SARS-CoV-2 viral copies this assay can detect is 138 copies/mL. A negative result does not preclude SARS-Cov-2 infection and should not be used as the sole basis for treatment or other patient management decisions. A negative result may occur with  improper specimen collection/handling, submission of specimen other than nasopharyngeal swab, presence of viral mutation(s) within the areas targeted by this assay, and inadequate number of viral copies(<138 copies/mL). A negative result must be combined with clinical observations,  patient history, and epidemiological information. The expected result is Negative.  Fact Sheet for Patients:  EntrepreneurPulse.com.au  Fact Sheet for Healthcare Providers:  IncredibleEmployment.be  This test is no t yet approved or cleared by the [place] FDA and  has been authorized for detection and/or diagnosis of SARS-CoV-2 by FDA under an Emergency Use Authorization (EUA). This EUA will remain  in effect (meaning this test can be used) for the duration of the COVID-19 declaration under Section 564(b)(1) of the Act, 21 U.S.C.section 360bbb-3(b)(1), unless the authorization is terminated  or revoked sooner.       Influenza A by PCR NEGATIVE NEGATIVE Final   Influenza B by PCR NEGATIVE NEGATIVE Final    Comment: (NOTE) The Xpert Xpress SARS-CoV-2/FLU/RSV plus assay is intended as an aid in the diagnosis of influenza from Nasopharyngeal swab specimens and should not be used as a sole basis for treatment. Nasal washings and aspirates are unacceptable for Xpert Xpress SARS-CoV-2/FLU/RSV testing.  Fact Sheet for Patients: EntrepreneurPulse.com.au  Fact Sheet for Healthcare  Providers: IncredibleEmployment.be  This test is not yet approved or cleared by the [place] FDA and has been authorized for detection and/or diagnosis of SARS-CoV-2 by FDA under an Emergency Use Authorization (EUA). This EUA will remain in effect (meaning this test can be used) for the duration of the COVID-19 declaration under Section 564(b)(1) of the Act, 21 U.S.C. section 360bbb-3(b)(1), unless the authorization is terminated or revoked.  Performed at [facility], [place] [address] [place], [place] [address]   Culture, blood (routine x 2)     Status: None (Preliminary result)   Collection Time: 05/12/21 10:00 PM   Specimen: BLOOD  Result Value Ref Range Status   Specimen Description BLOOD RIGHT HAND  Final   Special Requests   Final    BOTTLES DRAWN AEROBIC ONLY Blood Culture results may not be optimal due to an inadequate volume of blood received in culture bottles   Culture   Final    NO GROWTH < 12 HOURS Performed at [facility], [place] [address] [place], [place] [address]    Report Status PENDING  Incomplete     Radiology Studies: DG Chest Port 1 View  Result Date: 05/13/2021 CLINICAL DATA:  Pneumonia. EXAM: PORTABLE CHEST 1 VIEW COMPARISON:  05/12/2021.  12/12/2018. FINDINGS: Mediastinum and hilar structures normal. Heart size normal. Low lung volumes with mild bibasilar atelectasis. No pleural effusion or pneumothorax. IMPRESSION: Low lung volumes with mild bibasilar atelectasis and or scarring again noted. No interim change. Electronically Signed   By: Marcello Moores  Register   On: 05/13/2021 06:31   DG Chest Port 1 View  Result Date: 05/12/2021 CLINICAL DATA:  Shortness of breath and weakness. EXAM: PORTABLE CHEST 1 VIEW COMPARISON:  12/12/2018 and CT chest 09/03/2017. FINDINGS: Patient is slightly rotated. Trachea is midline. Heart size is accentuated by AP technique and low lung volumes. Streaky densities in the lung bases. No airspace  consolidation or pleural fluid. IMPRESSION: Low lung volumes with bibasilar subsegmental atelectasis or scarring. Electronically Signed   By: Lorin Picket M.D.   On: 05/12/2021 13:30   DG Swallowing Func-Speech Pathology  Result Date: 05/13/2021 Objective Swallowing Evaluation: Type of Study: MBS-Modified Barium Swallow Study  Patient Details Name: [name] MRN: [id number] Date of Birth: Mar 13, 1961 Today's Date: 05/13/2021 Time: SLP Start Time (ACUTE ONLY): 2536 -SLP Stop Time (ACUTE ONLY): 1403 SLP Time Calculation (min) (ACUTE ONLY): 25 min Past Medical History: Past Medical History: Diagnosis  Date . Adult ADHD  . Aspiration pneumonitis ([place]) 04/2017, 08/2017  Recurrent . Chronic respiratory failure ([place])   As of 04/2018, Dr. Chase Caller suspects hypercapnic RF--but [name] normal.  May eventually need nocturnal noninvasive ventilation.  Ultimately, if progresses enough pt will need trach. . Constipation, chronic   Has caused irritative urinary/bladder symptoms in the past. . Depression  . Dizziness 06/2011+  Noncontrast CT and noncontrast MRI both negative 06/2011 at [facility] ED visit. . Elevated transaminase level   Liver biopsy revealed fatty liver . Erectile dysfunction  . Facial fracture ([place]) 06/2011  Nondisplaced bilateral [place] I fracture: no surgery required. (ENT, Dr. Wilburn Cornelia)  . Fatty liver disease, nonalcoholic  . GERD (gastroesophageal reflux disease)  . History of thyroid cancer 2010  Medullary carcinoma of thyroid. Thyroidectomy 08/2009 (Dr. Aida Puffer at [facility]).  Needs periodic serum calcitonin monitoring. Marland Kitchen Hyperlipidemia  . Hypothyroidism  . LVH (left ventricular hypertrophy)   by EKG criteria 06/2011.  Hx of heart murmur--Followed by [facility]ology at [facility]: EF's have been "stable" per cardio office note 12/29/10.  His echo 12/2012 showed normal LV size and wall thickness but EF 45-50% and global LV hypokinesis was found. . Myotonic dystrophy ([place]) Dx'd 1996  Progressive muscle weakness and swallowing  dysfunction.  Initial dx by Dr. Jannifer Franklin, neurologist, but subsequent annual specialist f/u has been with Dr. Ladon Applebaum [facility].  Progressive debilitation--PT referral 04/2017, pt quit PT 05/2017. . Nephrolithiasis  . Obesity, Class I, BMI 30-34.9  . OSA (obstructive sleep apnea)   SEVERE 08/2019 sleep study). CPAP to be initiated as of 04/18/20 pulm (Dr. Annamaria Boots) . Pulmonary nodule, right 05/2017  RUL, 7 mm sub solid nodule.  Resolution noted on f/u CT 08/2017. Past Surgical History: Past Surgical History: Procedure Laterality Date . COLONOSCOPY  09/13/2007  normal (Dr. Deatra Ina). . EXTRACORPOREAL SHOCK WAVE LITHOTRIPSY   . Home sleep study  08/31/2019  Severe OSA . THYROIDECTOMY  08/2009  for medullary thyroid cancer (Dr. Conley Canal at [facility] him for this). . TRANSTHORACIC ECHOCARDIOGRAM  12/2012; 09/2017  2014-Normal LV size and wall thickness, LV EF 45-50%, with global hypokinesis of LV.  2018- EF 50-55%, normal wall motion, grd I DD, valves normal. HPI: Pt is a 60 y.o. male who presented with new onset of shortness of breath and hypoxia. Per H&P, has chronic dysphagia, secondary to myotonic dystrophy, he has been followed with speech therapist and recommended G-tubes, but pt has declined several times. MBS 09/27/17: moderate pharyngeal dysphagia marked by decreased ROM of pharyngeal/laryngeal musculature and potential weakness, leading to poor base-of-tongue to pharyngeal wall approximation, trace aspiration with larger boluses.  There is reduced inversion of epiglottis over laryngeal vestibule. A regular texture diety with thin liquids was recommended at that time. CXR 5/19: Low lung volumes with mild bibasilar atelectasis and or scarring  again noted. PMH: myotonic dystrophy, OSA, noncompliant with CPAP, hypothyroidism, chronic dysphagia, anxiety/depression  No data recorded Assessment / Plan / Recommendation CHL IP CLINICAL IMPRESSIONS 05/13/2021 Clinical Impression Pt presents with oropharyngeal dysphagia  characterized by weak bolus manipulation, prolonged mastication and reduction in lingual retraction, pharyngeal constriction, hyolaryngeal elevation, and anterior laryngeal movement. Epiglottic inversion was incomplete, but some airway protention was achieved by approximation of the arytenoids to the laryngeal surface of the epiglottis. Residue was noted in the valleculae, pyriform sinuses and on the posterior pharyngeal wall due to reduced pressure, but vallecular residue was most significant. Residue was improved to a more functional level with use of a liquid wash. Penetration (PAS  3,5) was consistently noted with thin liquids via cup and straw and intermittently resulted in trace silent aspiration (PAS 8). Laryngeal invasion was secondary to incomplete epiglottic inversion. No functional benefit was noted with postural modifications, but prompted coughing was succesful in expelling penetrated material and therefore avoiding subsequent aspiration. Laryngeal invasion (PAS 3,5) was less frequently noted with nectar thick liquids, but this resulted in increased pharyngeal residue which could increase aspiration risk after deglutition and prompted coughing was less effective in fully expelling penetrated material. A dysphagia 3 diet with thin liquids (via cup) is recommended at this time with strict observance of swallowing precautions and with full supervision to ensure observance of swallowing precautions. Esophageal screening again revealed stasis of barium in the cervical, and thoracic esophagus as was similarly noted in 2018. SLP recommends esophageal assessment (e.g., esophagram) and/or GI consult. SLP Visit Diagnosis Dysphagia, oropharyngeal phase (R13.12) Attention and concentration deficit following -- Frontal lobe and executive function deficit following -- Impact on safety and function Mild aspiration risk;Moderate aspiration risk   CHL IP TREATMENT RECOMMENDATION 05/13/2021 Treatment Recommendations  Therapy as outlined in treatment plan below   Prognosis 05/13/2021 Prognosis for Safe Diet Advancement Fair Barriers to Reach Goals Time post onset;Motivation Barriers/Prognosis Comment -- CHL IP DIET RECOMMENDATION 05/13/2021 SLP Diet Recommendations Dysphagia 3 (Mech soft) solids;Thin liquid Liquid Administration via Cup;No straw Medication Administration Whole meds with puree Compensations Slow rate;Small sips/bites Postural Changes Seated upright at 90 degrees;Remain semi-upright after after feeds/meals (Comment)   CHL IP OTHER RECOMMENDATIONS 05/13/2021 Recommended Consults -- Oral Care Recommendations Oral care BID Other Recommendations --   CHL IP FOLLOW UP RECOMMENDATIONS 05/13/2021 Follow up Recommendations (No Data)   CHL IP FREQUENCY AND DURATION 05/13/2021 Speech Therapy Frequency (ACUTE ONLY) min 2x/week Treatment Duration 2 weeks      CHL IP ORAL PHASE 05/13/2021 Oral Phase Impaired Oral - Pudding Teaspoon -- Oral - Pudding Cup -- Oral - Honey Teaspoon -- Oral - Honey Cup -- Oral - Nectar Teaspoon -- Oral - Nectar Cup Weak lingual manipulation Oral - Nectar Straw Weak lingual manipulation Oral - Thin Teaspoon -- Oral - Thin Cup Weak lingual manipulation Oral - Thin Straw Weak lingual manipulation Oral - Puree Weak lingual manipulation Oral - Mech Soft -- Oral - Regular Weak lingual manipulation;Impaired mastication Oral - Multi-Consistency -- Oral - Pill -- Oral Phase - Comment --  CHL IP PHARYNGEAL PHASE 05/13/2021 Pharyngeal Phase Impaired Pharyngeal- Pudding Teaspoon -- Pharyngeal -- Pharyngeal- Pudding Cup -- Pharyngeal -- Pharyngeal- Honey Teaspoon -- Pharyngeal -- Pharyngeal- Honey Cup -- Pharyngeal -- Pharyngeal- Nectar Teaspoon -- Pharyngeal -- Pharyngeal- Nectar Cup Reduced epiglottic inversion;Reduced anterior laryngeal mobility;Reduced laryngeal elevation;Reduced airway/laryngeal closure;Pharyngeal residue - valleculae;Pharyngeal residue - pyriform;Pharyngeal residue - posterior pharnyx;Trace  aspiration;Penetration/Aspiration during swallow;Penetration/Apiration after swallow Pharyngeal Material enters airway, remains ABOVE vocal cords and not ejected out;Material enters airway, CONTACTS cords and not ejected out;Material enters airway, passes BELOW cords without attempt by patient to eject out (silent aspiration) Pharyngeal- Nectar Straw Reduced epiglottic inversion;Reduced anterior laryngeal mobility;Reduced laryngeal elevation;Reduced airway/laryngeal closure;Pharyngeal residue - valleculae;Pharyngeal residue - pyriform;Pharyngeal residue - posterior pharnyx;Trace aspiration;Penetration/Aspiration during swallow;Penetration/Apiration after swallow Pharyngeal Material enters airway, remains ABOVE vocal cords and not ejected out;Material enters airway, CONTACTS cords and not ejected out;Material enters airway, passes BELOW cords without attempt by patient to eject out (silent aspiration) Pharyngeal- Thin Teaspoon -- Pharyngeal -- Pharyngeal- Thin Cup Reduced epiglottic inversion;Reduced anterior laryngeal mobility;Reduced laryngeal elevation;Reduced airway/laryngeal closure;Pharyngeal residue - valleculae;Pharyngeal residue - pyriform;Pharyngeal residue - posterior pharnyx;Trace  aspiration;Penetration/Aspiration during swallow;Penetration/Apiration after swallow Pharyngeal Material enters airway, remains ABOVE vocal cords and not ejected out;Material enters airway, CONTACTS cords and not ejected out;Material enters airway, passes BELOW cords without attempt by patient to eject out (silent aspiration) Pharyngeal- Thin Straw Reduced epiglottic inversion;Reduced anterior laryngeal mobility;Reduced laryngeal elevation;Reduced airway/laryngeal closure;Pharyngeal residue - valleculae;Pharyngeal residue - pyriform;Pharyngeal residue - posterior pharnyx;Trace aspiration;Penetration/Aspiration during swallow;Penetration/Apiration after swallow Pharyngeal Material enters airway, remains ABOVE vocal cords and not  ejected out;Material enters airway, CONTACTS cords and not ejected out;Material enters airway, passes BELOW cords without attempt by patient to eject out (silent aspiration) Pharyngeal- Puree Reduced epiglottic inversion;Reduced anterior laryngeal mobility;Reduced laryngeal elevation;Reduced airway/laryngeal closure;Pharyngeal residue - valleculae;Pharyngeal residue - pyriform;Pharyngeal residue - posterior pharnyx Pharyngeal -- Pharyngeal- Mechanical Soft -- Pharyngeal -- Pharyngeal- Regular Reduced epiglottic inversion;Reduced anterior laryngeal mobility;Reduced laryngeal elevation;Reduced airway/laryngeal closure;Pharyngeal residue - valleculae;Pharyngeal residue - pyriform;Pharyngeal residue - posterior pharnyx Pharyngeal -- Pharyngeal- Multi-consistency -- Pharyngeal -- Pharyngeal- Pill Reduced epiglottic inversion;Reduced anterior laryngeal mobility;Reduced laryngeal elevation;Reduced airway/laryngeal closure;Pharyngeal residue - valleculae;Pharyngeal residue - pyriform;Pharyngeal residue - posterior pharnyx Pharyngeal -- Pharyngeal Comment --  CHL IP CERVICAL ESOPHAGEAL PHASE 05/13/2021 Cervical Esophageal Phase Impaired Pudding Teaspoon -- Pudding Cup -- Honey Teaspoon -- Honey Cup -- Nectar Teaspoon -- Nectar Cup -- Nectar Straw -- Thin Teaspoon (No Data) Thin Cup -- Thin Straw -- Puree -- Mechanical Soft -- Regular -- Multi-consistency -- Pill -- Cervical Esophageal Comment -- Shanika I. Hardin Negus, Tripp, [place] Office number [phone] Pager [place] 05/13/2021, 4:04 PM               Scheduled Meds: . acetaminophen  1,000 mg Oral QHS  . amphetamine-dextroamphetamine  40 mg Oral Daily  . enoxaparin (LOVENOX) injection  40 mg Subcutaneous Q24H  . FLUoxetine  40 mg Oral Daily  . levothyroxine  150 mcg Oral Once per day on Mon Tue Wed Thu Fri Sat  . melatonin  10 mg Oral QHS  . pantoprazole  40 mg Oral Daily  . traZODone  200 mg Oral QHS    Continuous Infusions: . ampicillin-sulbactam (UNASYN) IV 3 g (05/13/21 1550)     LOS: 1 day   Marylu Lund, MD [facility] Pager On Amion  If 7PM-7AM, please contact night-coverage 05/13/2021, 6:26 PM

## 2021-05-14 LAB — COMPREHENSIVE METABOLIC PANEL
ALT: 32 U/L (ref 0–44)
AST: 32 U/L (ref 15–41)
Albumin: 3 g/dL — ABNORMAL LOW (ref 3.5–5.0)
Alkaline Phosphatase: 56 U/L (ref 38–126)
Anion gap: 6 (ref 5–15)
BUN: 6 mg/dL (ref 6–20)
CO2: 33 mmol/L — ABNORMAL HIGH (ref 22–32)
Calcium: 8.8 mg/dL — ABNORMAL LOW (ref 8.9–10.3)
Chloride: 99 mmol/L (ref 98–111)
Creatinine, Ser: 0.72 mg/dL (ref 0.61–1.24)
GFR, Estimated: >60 mL/min (ref >60)
Glucose, Bld: 104 mg/dL — ABNORMAL HIGH (ref 70–99)
Potassium: 3.2 mmol/L — ABNORMAL LOW (ref 3.5–5.1)
Sodium: 138 mmol/L (ref 135–145)
Total Bilirubin: 0.8 mg/dL (ref 0.3–1.2)
Total Protein: 6.2 g/dL — ABNORMAL LOW (ref 6.5–8.1)

## 2021-05-14 LAB — CBC
HCT: 44.9 % (ref 39.0–52.0)
Hemoglobin: 14 g/dL (ref 13.0–17.0)
MCH: 30.8 pg (ref 26.0–34.0)
MCHC: 31.2 g/dL (ref 30.0–36.0)
MCV: 98.7 fL (ref 80.0–100.0)
Platelets: 124 10*3/uL — ABNORMAL LOW (ref 150–400)
RBC: 4.55 MIL/uL (ref 4.22–5.81)
RDW: 15 % (ref 11.5–15.5)
WBC: 4.9 10*3/uL (ref 4.0–10.5)
nRBC: 0 % (ref 0.0–0.2)

## 2021-05-14 MED ORDER — POTASSIUM CHLORIDE CRYS ER 20 MEQ PO TBCR
40.0000 meq | EXTENDED_RELEASE_TABLET | ORAL | Status: AC
  Administered 2021-05-14 (×2): 40 meq via ORAL
  Filled 2021-05-14 (×2): qty 2

## 2021-05-14 NOTE — Progress Notes (Signed)
PROGRESS NOTE    [name]  [id number] DOB: February 06, 1961 DOA: 05/12/2021 PCP: Tammi Sou, MD    Brief Narrative:  60 y.o. male with medical history significant of myotonic dystrophy, OSA, noncompliant with CPAP, hypothyroidism, chronic dysphagia, anxiety/depression, presented with new onset of shortness of breath and hypoxia  Assessment & Plan:   Active Problems:   Aspiration pneumonia (HCC)   Aspiration into airway  Acute, probably on chronic hypoxic and hypercapnic respiratory failure -concerns were noted with aspiration and noncompliant with CPAP machine for his OSA -Pt had been continued on Unasyn -Wean O2 as tolerated, currently weaned to [facility] -SLP was consulted. Pt is now s/p MBS with findings notable for stasis of barium in cervical and thoracic esophagus with recs for esophogram vs GI consult -Continued on dysphagia 3 with thin liquids per SLP -Have consulted GI per SLP recs  Resting tremors -TSH of 0.098. Free T4 is within normal limits at 1.63 -May need to adjust Adderall doses, follow-up with outpatient neurology. -CK level is unremarkable  Dehydration -Pt had been continued on IVF hydration  Myotonic dystrophy -Several family members suffers from the conditions, noted.  Hypothyroidism -TSH of 0.098 -T4 within normal limits per above  Ambulation dysfunction -PT evaluated. Recommendations were noted for CIR -Have consulted CIR  DVT prophylaxis: Lovenox subq Code Status: Partial Family Communication: Pt in room, family not at bedside  Status is: Inpatient  Remains inpatient appropriate because:Inpatient level of care appropriate due to severity of illness   Dispo: The patient is from: Home              Anticipated d/c is to: Home              Patient currently is not medically stable to d/c.   Difficult to place patient No       Consultants:   GI  CIR  Procedures:     Antimicrobials: Anti-infectives (From  admission, onward)   Start     Dose/Rate Route Frequency Ordered Stop   05/12/21 2200  Ampicillin-Sulbactam (UNASYN) 3 g in sodium chloride 0.9 % 100 mL IVPB        3 g 200 mL/hr over 30 Minutes Intravenous Every 8 hours 05/12/21 1737     05/12/21 1500  piperacillin-tazobactam (ZOSYN) IVPB 3.375 g  Status:  Discontinued        3.375 g 12.5 mL/hr over 240 Minutes Intravenous Every 8 hours 05/12/21 1447 05/12/21 1725   05/12/21 1230  piperacillin-tazobactam (ZOSYN) IVPB 3.375 g  Status:  Discontinued        3.375 g 100 mL/hr over 30 Minutes Intravenous  Once 05/12/21 1219 05/12/21 1447      Subjective: Without complaints today  Objective: Vitals:   05/13/21 0504 05/13/21 0600 05/13/21 1944 05/14/21 0446  BP: 121/65  110/75 123/68  Pulse: 70 71 100 87  Resp: 20  18 20   Temp: 98 F (36.7 C)  97.8 F (36.6 C) 98 F (36.7 C)  TempSrc: Oral  Oral Oral  SpO2: 100% 96% 100% 98%    Intake/Output Summary (Last 24 hours) at 05/14/2021 1034 Last data filed at 05/14/2021 [id number] Gross per 24 hour  Intake --  Output 1475 ml  Net -1475 ml   There were no vitals filed for this visit.  Examination: General exam: Conversant, in no acute distress Respiratory system: normal chest rise, clear, no audible wheezing Cardiovascular system: regular rhythm, s1-s2 Gastrointestinal system: Nondistended, nontender, pos BS Central nervous system: No seizures,  no tremors Extremities: No cyanosis, no joint deformities Skin: No rashes, no pallor Psychiatry: Affect normal // no auditory hallucinations   Data Reviewed: I have personally reviewed following labs and imaging studies  CBC: Recent Labs  Lab 05/12/21 1415 05/12/21 1427 05/14/21 0407  WBC 5.1  --  4.9  NEUTROABS 3.4  --   --   HGB 14.9 16.0  16.3 14.0  HCT 49.3 47.0  48.0 44.9  MCV 100.4*  --  98.7  PLT 145*  --  751*   Basic Metabolic Panel: Recent Labs  Lab 05/12/21 1415 05/12/21 1427 05/13/21 0227 05/14/21 0407  NA 139  139  139 139 138  K 5.4* 4.3  4.3 4.2 3.2*  CL 99 99 100 99  CO2 34*  --  30 33*  GLUCOSE 92 95 82 104*  BUN 12 13 10 6   CREATININE 0.96 0.80 0.89 0.72  CALCIUM 9.1  --  9.1 8.8*   GFR: CrCl cannot be calculated (Unknown ideal weight.). Liver Function Tests: Recent Labs  Lab 05/12/21 1415 05/14/21 0407  AST 48* 32  ALT 36 32  ALKPHOS 63 56  BILITOT 1.2 0.8  PROT 6.8 6.2*  ALBUMIN 3.5 3.0*   No results for input(s): LIPASE, AMYLASE in the last 168 hours. No results for input(s): AMMONIA in the last 168 hours. Coagulation Profile: Recent Labs  Lab 05/12/21 1415  INR 1.1   Cardiac Enzymes: Recent Labs  Lab 05/12/21 1817  CKTOTAL 127   BNP (last 3 results) No results for input(s): PROBNP in the last 8760 hours. HbA1C: No results for input(s): HGBA1C in the last 72 hours. CBG: No results for input(s): GLUCAP in the last 168 hours. Lipid Profile: No results for input(s): CHOL, HDL, LDLCALC, TRIG, CHOLHDL, LDLDIRECT in the last 72 hours. Thyroid Function Tests: Recent Labs    05/12/21 1817  TSH 0.098*   Anemia Panel: No results for input(s): VITAMINB12, FOLATE, FERRITIN, TIBC, IRON, RETICCTPCT in the last 72 hours. Sepsis Labs: Recent Labs  Lab 05/12/21 1817 05/12/21 2156  LATICACIDVEN 1.5 3.5*    Recent Results (from the past 240 hour(s))  Culture, blood (routine x 2)     Status: None (Preliminary result)   Collection Time: 05/12/21 11:45 AM   Specimen: BLOOD  Result Value Ref Range Status   Specimen Description BLOOD LEFT ANTECUBITAL  Final   Special Requests   Final    BOTTLES DRAWN AEROBIC AND ANAEROBIC Blood Culture results may not be optimal due to an inadequate volume of blood received in culture bottles   Culture   Final    NO GROWTH 2 DAYS Performed at [facility] [address] [place], [place] [address]    Report Status PENDING  Incomplete  Resp Panel by RT-PCR (Flu A&B, Covid) Nasopharyngeal Swab     Status: None   Collection  Time: 05/12/21 11:59 AM   Specimen: Nasopharyngeal Swab; Nasopharyngeal(NP) swabs in vial transport medium  Result Value Ref Range Status   SARS Coronavirus 2 by RT PCR NEGATIVE NEGATIVE Final    Comment: (NOTE) SARS-CoV-2 target nucleic acids are NOT DETECTED.  The SARS-CoV-2 RNA is generally detectable in upper respiratory specimens during the acute phase of infection. The lowest concentration of SARS-CoV-2 viral copies this assay can detect is 138 copies/mL. A negative result does not preclude SARS-Cov-2 infection and should not be used as the sole basis for treatment or other patient management decisions. A negative result may occur with  improper  specimen collection/handling, submission of specimen other than nasopharyngeal swab, presence of viral mutation(s) within the areas targeted by this assay, and inadequate number of viral copies(<138 copies/mL). A negative result must be combined with clinical observations, patient history, and epidemiological information. The expected result is Negative.  Fact Sheet for Patients:  EntrepreneurPulse.com.au  Fact Sheet for Healthcare Providers:  IncredibleEmployment.be  This test is no t yet approved or cleared by the [place] FDA and  has been authorized for detection and/or diagnosis of SARS-CoV-2 by FDA under an Emergency Use Authorization (EUA). This EUA will remain  in effect (meaning this test can be used) for the duration of the COVID-19 declaration under Section 564(b)(1) of the Act, 21 U.S.C.section 360bbb-3(b)(1), unless the authorization is terminated  or revoked sooner.       Influenza A by PCR NEGATIVE NEGATIVE Final   Influenza B by PCR NEGATIVE NEGATIVE Final    Comment: (NOTE) The Xpert Xpress SARS-CoV-2/FLU/RSV plus assay is intended as an aid in the diagnosis of influenza from Nasopharyngeal swab specimens and should not be used as a sole basis for treatment. Nasal washings  and aspirates are unacceptable for Xpert Xpress SARS-CoV-2/FLU/RSV testing.  Fact Sheet for Patients: EntrepreneurPulse.com.au  Fact Sheet for Healthcare Providers: IncredibleEmployment.be  This test is not yet approved or cleared by the [place] FDA and has been authorized for detection and/or diagnosis of SARS-CoV-2 by FDA under an Emergency Use Authorization (EUA). This EUA will remain in effect (meaning this test can be used) for the duration of the COVID-19 declaration under Section 564(b)(1) of the Act, 21 U.S.C. section 360bbb-3(b)(1), unless the authorization is terminated or revoked.  Performed at [facility], [place] [address] [place], [place] [address]   Culture, blood (routine x 2)     Status: None (Preliminary result)   Collection Time: 05/12/21 10:00 PM   Specimen: BLOOD  Result Value Ref Range Status   Specimen Description BLOOD RIGHT HAND  Final   Special Requests   Final    BOTTLES DRAWN AEROBIC ONLY Blood Culture results may not be optimal due to an inadequate volume of blood received in culture bottles   Culture   Final    NO GROWTH 2 DAYS Performed at [facility], [place] [address] [place], [place] [address]    Report Status PENDING  Incomplete     Radiology Studies: DG Chest Port 1 View  Result Date: 05/13/2021 CLINICAL DATA:  Pneumonia. EXAM: PORTABLE CHEST 1 VIEW COMPARISON:  05/12/2021.  12/12/2018. FINDINGS: Mediastinum and hilar structures normal. Heart size normal. Low lung volumes with mild bibasilar atelectasis. No pleural effusion or pneumothorax. IMPRESSION: Low lung volumes with mild bibasilar atelectasis and or scarring again noted. No interim change. Electronically Signed   By: Marcello Moores  Register   On: 05/13/2021 06:31   DG Chest Port 1 View  Result Date: 05/12/2021 CLINICAL DATA:  Shortness of breath and weakness. EXAM: PORTABLE CHEST 1 VIEW COMPARISON:  12/12/2018 and CT chest 09/03/2017.  FINDINGS: Patient is slightly rotated. Trachea is midline. Heart size is accentuated by AP technique and low lung volumes. Streaky densities in the lung bases. No airspace consolidation or pleural fluid. IMPRESSION: Low lung volumes with bibasilar subsegmental atelectasis or scarring. Electronically Signed   By: Lorin Picket M.D.   On: 05/12/2021 13:30   DG Swallowing Func-Speech Pathology  Result Date: 05/13/2021 Objective Swallowing Evaluation: Type of Study: MBS-Modified Barium Swallow Study  Patient Details Name: RUSHAWN CAPSHAW MRN: [id number]  Date of Birth: Jul 28, 1961 Today's Date: 05/13/2021 Time: SLP Start Time (ACUTE ONLY): 1338 -SLP Stop Time (ACUTE ONLY): 1403 SLP Time Calculation (min) (ACUTE ONLY): 25 min Past Medical History: Past Medical History: Diagnosis Date . Adult ADHD  . Aspiration pneumonitis ([place]) 04/2017, 08/2017  Recurrent . Chronic respiratory failure ([place])   As of 04/2018, Dr. Chase Caller suspects hypercapnic RF--but [name] normal.  May eventually need nocturnal noninvasive ventilation.  Ultimately, if progresses enough pt will need trach. . Constipation, chronic   Has caused irritative urinary/bladder symptoms in the past. . Depression  . Dizziness 06/2011+  Noncontrast CT and noncontrast MRI both negative 06/2011 at [facility] ED visit. . Elevated transaminase level   Liver biopsy revealed fatty liver . Erectile dysfunction  . Facial fracture ([place]) 06/2011  Nondisplaced bilateral [place] I fracture: no surgery required. (ENT, Dr. Wilburn Cornelia)  . Fatty liver disease, nonalcoholic  . GERD (gastroesophageal reflux disease)  . History of thyroid cancer 2010  Medullary carcinoma of thyroid. Thyroidectomy 08/2009 (Dr. Aida Puffer at [facility]).  Needs periodic serum calcitonin monitoring. [place] Kitchen Hyperlipidemia  . Hypothyroidism  . LVH (left ventricular hypertrophy)   by EKG criteria 06/2011.  Hx of heart murmur--Followed by cardiology at [facility]: EF's have been "stable" per cardio office note 12/29/10.  His echo 12/2012  showed normal LV size and wall thickness but EF 45-50% and global LV hypokinesis was found. . Myotonic dystrophy ([place]) Dx'd 1996  Progressive muscle weakness and swallowing dysfunction.  Initial dx by Dr. Jannifer Franklin, neurologist, but subsequent annual specialist f/u has been with Dr. Ladon [facility].  Progressive debilitation--PT referral 04/2017, pt quit PT 05/2017. . Nephrolithiasis  . Obesity, Class I, BMI 30-34.9  . OSA (obstructive sleep apnea)   SEVERE 08/2019 sleep study). CPAP to be initiated as of 04/18/20 pulm (Dr. Annamaria Boots) . Pulmonary nodule, right 05/2017  RUL, 7 mm sub solid nodule.  Resolution noted on f/u CT 08/2017. Past Surgical History: Past Surgical History: Procedure Laterality Date . COLONOSCOPY  09/13/2007  normal (Dr. Deatra Ina). . EXTRACORPOREAL SHOCK WAVE LITHOTRIPSY   . Home sleep study  08/31/2019  Severe OSA . THYROIDECTOMY  08/2009  for medullary thyroid cancer ([facility]. Conley Canal at [facility] follows him for this). . TRANSTHORACIC ECHOCARDIOGRAM  12/2012; 09/2017  2014-Normal LV size and wall thickness, LV EF 45-50%, with global hypokinesis of LV.  2018- EF 50-55%, normal wall motion, grd I DD, valves normal. HPI: Pt is a 60 y.o. male who presented with new onset of shortness of breath and hypoxia. Per H&P, has chronic dysphagia, secondary to myotonic dystrophy, he has been followed with speech therapist and recommended G-tubes, but pt has declined several times. MBS 09/27/17: moderate pharyngeal dysphagia marked by decreased ROM of pharyngeal/laryngeal musculature and potential weakness, leading to poor base-of-tongue to pharyngeal wall approximation, trace aspiration with larger boluses.  There is reduced inversion of epiglottis over laryngeal vestibule. A regular texture diety with thin liquids was recommended at that time. CXR 5/19: Low lung volumes with mild bibasilar atelectasis and or scarring  again noted. PMH: myotonic dystrophy, OSA, noncompliant with CPAP, hypothyroidism, chronic dysphagia,  anxiety/depression  No data recorded Assessment / Plan / Recommendation CHL IP CLINICAL IMPRESSIONS 05/13/2021 Clinical Impression Pt presents with oropharyngeal dysphagia characterized by weak bolus manipulation, prolonged mastication and reduction in lingual retraction, pharyngeal constriction, hyolaryngeal elevation, and anterior laryngeal movement. Epiglottic inversion was incomplete, but some airway protention was achieved by approximation of the arytenoids to the laryngeal surface of the epiglottis. Residue was noted in the  valleculae, pyriform sinuses and on the posterior pharyngeal wall due to reduced pressure, but vallecular residue was most significant. Residue was improved to a more functional level with use of a liquid wash. Penetration (PAS 3,5) was consistently noted with thin liquids via cup and straw and intermittently resulted in trace silent aspiration (PAS 8). Laryngeal invasion was secondary to incomplete epiglottic inversion. No functional benefit was noted with postural modifications, but prompted coughing was succesful in expelling penetrated material and therefore avoiding subsequent aspiration. Laryngeal invasion (PAS 3,5) was less frequently noted with nectar thick liquids, but this resulted in increased pharyngeal residue which could increase aspiration risk after deglutition and prompted coughing was less effective in fully expelling penetrated material. A dysphagia 3 diet with thin liquids (via cup) is recommended at this time with strict observance of swallowing precautions and with full supervision to ensure observance of swallowing precautions. Esophageal screening again revealed stasis of barium in the cervical, and thoracic esophagus as was similarly noted in 2018. SLP recommends esophageal assessment (e.g., esophagram) and/or GI consult. SLP Visit Diagnosis Dysphagia, oropharyngeal phase (R13.12) Attention and concentration deficit following -- Frontal lobe and executive function  deficit following -- Impact on safety and function Mild aspiration risk;Moderate aspiration risk   CHL IP TREATMENT RECOMMENDATION 05/13/2021 Treatment Recommendations Therapy as outlined in treatment plan below   Prognosis 05/13/2021 Prognosis for Safe Diet Advancement Fair Barriers to Reach Goals Time post onset;Motivation Barriers/Prognosis Comment -- CHL IP DIET RECOMMENDATION 05/13/2021 SLP Diet Recommendations Dysphagia 3 (Mech soft) solids;Thin liquid Liquid Administration via Cup;No straw Medication Administration Whole meds with puree Compensations Slow rate;Small sips/bites Postural Changes Seated upright at 90 degrees;Remain semi-upright after after feeds/meals (Comment)   CHL IP OTHER RECOMMENDATIONS 05/13/2021 Recommended Consults -- Oral Care Recommendations Oral care BID Other Recommendations --   CHL IP FOLLOW UP RECOMMENDATIONS 05/13/2021 Follow up Recommendations (No Data)   CHL IP FREQUENCY AND DURATION 05/13/2021 Speech Therapy Frequency (ACUTE ONLY) min 2x/week Treatment Duration 2 weeks      CHL IP ORAL PHASE 05/13/2021 Oral Phase Impaired Oral - Pudding Teaspoon -- Oral - Pudding Cup -- Oral - Honey Teaspoon -- Oral - Honey Cup -- Oral - Nectar Teaspoon -- Oral - Nectar Cup Weak lingual manipulation Oral - Nectar Straw Weak lingual manipulation Oral - Thin Teaspoon -- Oral - Thin Cup Weak lingual manipulation Oral - Thin Straw Weak lingual manipulation Oral - Puree Weak lingual manipulation Oral - Mech Soft -- Oral - Regular Weak lingual manipulation;Impaired mastication Oral - Multi-Consistency -- Oral - Pill -- Oral Phase - Comment --  CHL IP PHARYNGEAL PHASE 05/13/2021 Pharyngeal Phase Impaired Pharyngeal- Pudding Teaspoon -- Pharyngeal -- Pharyngeal- Pudding Cup -- Pharyngeal -- Pharyngeal- Honey Teaspoon -- Pharyngeal -- Pharyngeal- Honey Cup -- Pharyngeal -- Pharyngeal- Nectar Teaspoon -- Pharyngeal -- Pharyngeal- Nectar Cup Reduced epiglottic inversion;Reduced anterior laryngeal  mobility;Reduced laryngeal elevation;Reduced airway/laryngeal closure;Pharyngeal residue - valleculae;Pharyngeal residue - pyriform;Pharyngeal residue - posterior pharnyx;Trace aspiration;Penetration/Aspiration during swallow;Penetration/Apiration after swallow Pharyngeal Material enters airway, remains ABOVE vocal cords and not ejected out;Material enters airway, CONTACTS cords and not ejected out;Material enters airway, passes BELOW cords without attempt by patient to eject out (silent aspiration) Pharyngeal- Nectar Straw Reduced epiglottic inversion;Reduced anterior laryngeal mobility;Reduced laryngeal elevation;Reduced airway/laryngeal closure;Pharyngeal residue - valleculae;Pharyngeal residue - pyriform;Pharyngeal residue - posterior pharnyx;Trace aspiration;Penetration/Aspiration during swallow;Penetration/Apiration after swallow Pharyngeal Material enters airway, remains ABOVE vocal cords and not ejected out;Material enters airway, CONTACTS cords and not ejected out;Material enters airway, passes BELOW cords without attempt by  patient to eject out (silent aspiration) Pharyngeal- Thin Teaspoon -- Pharyngeal -- Pharyngeal- Thin Cup Reduced epiglottic inversion;Reduced anterior laryngeal mobility;Reduced laryngeal elevation;Reduced airway/laryngeal closure;Pharyngeal residue - valleculae;Pharyngeal residue - pyriform;Pharyngeal residue - posterior pharnyx;Trace aspiration;Penetration/Aspiration during swallow;Penetration/Apiration after swallow Pharyngeal Material enters airway, remains ABOVE vocal cords and not ejected out;Material enters airway, CONTACTS cords and not ejected out;Material enters airway, passes BELOW cords without attempt by patient to eject out (silent aspiration) Pharyngeal- Thin Straw Reduced epiglottic inversion;Reduced anterior laryngeal mobility;Reduced laryngeal elevation;Reduced airway/laryngeal closure;Pharyngeal residue - valleculae;Pharyngeal residue - pyriform;Pharyngeal residue -  posterior pharnyx;Trace aspiration;Penetration/Aspiration during swallow;Penetration/Apiration after swallow Pharyngeal Material enters airway, remains ABOVE vocal cords and not ejected out;Material enters airway, CONTACTS cords and not ejected out;Material enters airway, passes BELOW cords without attempt by patient to eject out (silent aspiration) Pharyngeal- Puree Reduced epiglottic inversion;Reduced anterior laryngeal mobility;Reduced laryngeal elevation;Reduced airway/laryngeal closure;Pharyngeal residue - valleculae;Pharyngeal residue - pyriform;Pharyngeal residue - posterior pharnyx Pharyngeal -- Pharyngeal- Mechanical Soft -- Pharyngeal -- Pharyngeal- Regular Reduced epiglottic inversion;Reduced anterior laryngeal mobility;Reduced laryngeal elevation;Reduced airway/laryngeal closure;Pharyngeal residue - valleculae;Pharyngeal residue - pyriform;Pharyngeal residue - posterior pharnyx Pharyngeal -- Pharyngeal- Multi-consistency -- Pharyngeal -- Pharyngeal- Pill Reduced epiglottic inversion;Reduced anterior laryngeal mobility;Reduced laryngeal elevation;Reduced airway/laryngeal closure;Pharyngeal residue - valleculae;Pharyngeal residue - pyriform;Pharyngeal residue - posterior pharnyx Pharyngeal -- Pharyngeal Comment --  CHL IP CERVICAL ESOPHAGEAL PHASE 05/13/2021 Cervical Esophageal Phase Impaired Pudding Teaspoon -- Pudding Cup -- Honey Teaspoon -- Honey Cup -- Nectar Teaspoon -- Nectar Cup -- Nectar Straw -- Thin Teaspoon (No Data) Thin Cup -- Thin Straw -- Puree -- Mechanical Soft -- Regular -- Multi-consistency -- Pill -- Cervical Esophageal Comment -- Shanika I. Hardin Negus, [place], [place] Office number [phone] Pager [place] 05/13/2021, 4:04 PM               Scheduled Meds: . acetaminophen  1,000 mg Oral QHS  . amphetamine-dextroamphetamine  40 mg Oral Daily  . enoxaparin (LOVENOX) injection  40 mg Subcutaneous Q24H  . FLUoxetine  40 mg Oral Daily   . levothyroxine  150 mcg Oral Once per day on Mon Tue Wed Thu Fri Sat  . melatonin  10 mg Oral QHS  . pantoprazole  40 mg Oral Daily  . potassium chloride  40 mEq Oral Q4H  . traZODone  200 mg Oral QHS   Continuous Infusions: . sodium chloride 75 mL/hr at 05/14/21 1002  . ampicillin-sulbactam (UNASYN) IV 3 g (05/14/21 0631)     LOS: 2 days   Marylu Lund, MD [facility] Pager On Amion  If 7PM-7AM, please contact night-coverage 05/14/2021, 10:34 AM

## 2021-05-14 NOTE — Progress Notes (Signed)
Rehab Admissions Coordinator Note:  Patient was screened by Cleatrice Burke for appropriateness for an Inpatient Acute Rehab Consult per therapy recs. .  At this time, we are recommending Inpatient Rehab consult. I will place order per protocol.  Cleatrice Burke RN MSN 05/14/2021, 8:16 AM  I can be reached at [phone].

## 2021-05-14 NOTE — Progress Notes (Signed)
Inpatient Rehabilitation Admissions Coordinator  Inpatient rehab consult received. I met with patient and his wife at bedside. Patient previously at [facility] 2019 and went home with wife, [name], providing care needed. I discussed goals and expectations of a possible CIR admit. Patient would be a great candidate for CIR admit. I will begin insurance authorization with [employer] for a possible CIR admit pending insurance approval.  Danne Baxter, RN, MSN Rehab Admissions Coordinator [phone] 05/14/2021 1:39 PM

## 2021-05-14 NOTE — Progress Notes (Signed)
Physical Therapy Treatment Patient Details Name: [name] MRN: [id number] DOB: Apr 07, 1961 Today's Date: 05/14/2021    History of Present Illness 60 y.o. male presented with new onset of shortness of breath and hypoxia. +aspiration pneumonia requiring BiPAP  PMH- myotonic dystrophy, OSA, noncompliant with CPAP, hypothyroidism, chronic dysphagia, anxiety/depression    PT Comments    Pt admitted with above diagnosis. Pt was able to ambulate with +2 HHA needing min assist of 2 persons for stability and can only walk short distances and then sits abruptly therefore needs close chair follow. Wife present and aware of pts progress. Pt continues with poor safety awareness and poor ability to follow commands. Pt has difficulty sequencing feet and could not use RW as he did not appear to recognize how to motor plan to use it. Agree with need for CIR.   Pt currently with functional limitations due to balance and endurance deficits. Pt will benefit from skilled PT to increase their independence and safety with mobility to allow discharge to the venue listed below.     Follow Up Recommendations  CIR     Equipment Recommendations  Other (comment) (wife requests hoyer lift for getting pt off the floor when he falls)    Recommendations for Other Services Rehab consult;OT consult     Precautions / Restrictions Precautions Precautions: Fall Precaution Comments: has fallen at home; son can usually get him up but wife cannot Restrictions Weight Bearing Restrictions: No    Mobility  Bed Mobility Overal bed mobility: Needs Assistance Bed Mobility: Rolling;Sidelying to Sit Rolling: Mod assist Sidelying to sit: Mod assist       General bed mobility comments: Pt in chair on arrival    Transfers Overall transfer level: Needs assistance Equipment used: Rolling walker (2 wheeled);2 person hand held assist Transfers: Sit to/from Stand;Stand Pivot Transfers Sit to Stand: Min guard;Min  assist;+2 safety/equipment Stand pivot transfers: Mod assist;+2 physical assistance       General transfer comment: Pt sit to stand initially min assist with +2 for safety however progressed to min guard without assist at times. Pt varies in his assistance that he gives to therapy.  Pt would stand to RW and push it out and then flex his trunk and sit back on the chair.  He kept saying he couldnt move his feet.  Sit to stand about 5x.   Wife came in and it was decided to try a HHA on either side of pt as they did this PTA when he got sick recently. Pt was then able to take steps as belwo.  Ambulation/Gait Ambulation/Gait assistance: Mod assist;Min assist;+2 physical assistance Gait Distance (Feet): 5 Feet (3 feet, 5 feet, 3 feet, 5 feet) Assistive device: Rolling walker (2 wheeled);2 person hand held assist Gait Pattern/deviations: Step-through pattern;Decreased stride length;Shuffle;Antalgic;Trunk flexed;Wide base of support   Gait velocity interpretation: <1.31 ft/sec, indicative of household ambulator General Gait Details: Pt would not take steps when he had RW.  Once RW moved, pt was able to walk very short distances with close chair follow with +2 mod to min assist with HHA bilaterally. Pt with very flexed trunk which worsens quickly after pt taeks a few steps. Pt states he cannot maintain stanidng due to back pain.  Pt also widens BOS with gait as well.  Pt needs cues for safety with sitting to recliner as well.  Pt overall unsafe at times and needs constant assist when up.   Stairs  Wheelchair Mobility    Modified Rankin (Stroke Patients Only)       Balance Overall balance assessment: Needs assistance Sitting-balance support: No upper extremity supported;Feet supported Sitting balance-Leahy Scale: Fair     Standing balance support: Bilateral upper extremity supported;During functional activity Standing balance-Leahy Scale: Poor Standing balance comment:  requires UE support bilaterally and external support for safety with static stance                            Cognition Arousal/Alertness: Awake/alert Behavior During Therapy: Flat affect Overall Cognitive Status: Difficult to assess                                 General Comments: Patient A&Ox3. Unable to recall reason for admission. Noted STM deficits with some difficulty recalling level of assist required for ADLs PTA.      Exercises General Exercises - Lower Extremity Ankle Circles/Pumps: AROM;Both;5 reps;Supine Long Arc Quad: AROM;Both;5 reps;Seated    General Comments General comments (skin integrity, edema, etc.): Wife came in 1/2 way through session. She was helpful in that she told therapist how they had been walking with HHA at home.  Pt VSS on 2LO2.      Pertinent Vitals/Pain Pain Assessment: Faces Faces Pain Scale: Hurts even more Pain Location: Low back Pain Descriptors / Indicators: Aching;Sore Pain Intervention(s): Limited activity within patient's tolerance;Monitored during session;Repositioned    Home Living Family/patient expects to be discharged to:: Private residence Living Arrangements: Spouse/significant other Available Help at Discharge: Family;Available 24 hours/day Type of Home: House Home Access: Ramped entrance   Home Layout: One level Home Equipment: Walker - 2 wheels;Wheelchair - power;Hospital bed;Grab bars - toilet Additional Comments: stair lift    Prior Function Level of Independence: Needs assistance  Gait / Transfers Assistance Needed: transfers with assist (likes to pull up on wife's hand or walker); has to walk 2 steps into bathroom to get to toilet ADL's / Homemaking Assistance Needed: Patient reports assist with ADLs/IADLs at baseline including bathing/dressing and toileting. Patient reports ability to feed himself at baseline.     PT Goals (current goals can now be found in the care plan section) Acute Rehab  PT Goals Patient Stated Goal: Per chart review wife wants pt strong enough to transfer and walk 2 steps into bathroom with her assist Progress towards PT goals: Progressing toward goals    Frequency    Min 3X/week      PT Plan Current plan remains appropriate    Co-evaluation              AM-PAC PT "6 Clicks" Mobility   Outcome Measure  Help needed turning from your back to your side while in a flat bed without using bedrails?: A Lot Help needed moving from lying on your back to sitting on the side of a flat bed without using bedrails?: A Lot Help needed moving to and from a bed to a chair (including a wheelchair)?: Total Help needed standing up from a chair using your arms (e.g., wheelchair or bedside chair)?: Total Help needed to walk in hospital room?: Total Help needed climbing 3-5 steps with a railing? : Total 6 Click Score: 8    End of Session Equipment Utilized During Treatment: Gait belt;Oxygen Activity Tolerance: Patient limited by fatigue Patient left: in chair;with call bell/phone within reach;with family/visitor present;with chair alarm set Nurse Communication: Mobility  status PT Visit Diagnosis: Other abnormalities of gait and mobility (R26.89);Muscle weakness (generalized) (M62.81);Other symptoms and signs involving the nervous system (Y18.563)     Time: 1497-0263 PT Time Calculation (min) (ACUTE ONLY): 30 min  Charges:  $Gait Training: 23-37 mins                     [name],PT Acute Rehab Services [phone] (pager)   Alvira Philips 05/14/2021, 12:53 PM

## 2021-05-14 NOTE — Progress Notes (Signed)
  Speech Language Pathology Treatment: Dysphagia  Patient Details Name: [name] MRN: [id number] DOB: Jan 17, 1961 Today's Date: 05/14/2021 Time: 9983-3825 SLP Time Calculation (min) (ACUTE ONLY): 20 min  Assessment / Plan / Recommendation Clinical Impression  Pt was seen for dysphagia treatment with his wife present. Pt was adequately alert but confused. Many of his responses were unrelated to questions and included reference to idioms. For example, pt once stated, "I don't know it's the twelfth idiom." Pt's nurse and wife reported that the pt has been tolerating the current diet without overt s/sx of aspiration as was reportedly demonstrated with a large piece of pancake yesterday. Pt's wife stated that the pt has been consistently demonstrating the post-swallow cough with thin liquids with cueing today. Pt was resistant to p.o. intake but ultimately accepted a single bite of his grilled cheese sandwich. Mastication was prolonged, but his wife stated that this is his baseline due to myotic dystrophy. Additional p.o. intake was refused by the pt. Further diet modification does not appear to be warranted at this time since pt's consistency with swallowing precautions has improved from yesterday. SLP will continue to follow pt.    HPI HPI: Pt is a 60 y.o. male who presented with new onset of shortness of breath and hypoxia. Per H&P, has chronic dysphagia, secondary to myotonic dystrophy, he has been followed with speech therapist and recommended G-tubes, but pt has declined several times. MBS 09/27/17: moderate pharyngeal dysphagia marked by decreased ROM of pharyngeal/laryngeal musculature and potential weakness, leading to poor base-of-tongue to pharyngeal wall approximation, trace aspiration with larger boluses.  There is reduced inversion of epiglottis over laryngeal vestibule. A regular texture diety with thin liquids was recommended at that time. CXR 5/19: Low lung volumes with mild bibasilar  atelectasis and or scarring  again noted. PMH: myotonic dystrophy, OSA, noncompliant with CPAP, hypothyroidism, chronic dysphagia, anxiety/depression      SLP Plan  Continue with current plan of care       Recommendations  Diet recommendations: Dysphagia 3 (mechanical soft);Thin liquid Liquids provided via: Cup;No straw Medication Administration: Whole meds with puree Supervision: Full supervision/cueing for compensatory strategies;Patient able to self feed Compensations: Slow rate;Small sips/bites (Cough and swallow after intake of thin liquids) Postural Changes and/or Swallow Maneuvers: Seated upright 90 degrees;Upright 30-60 min after meal                Oral Care Recommendations: Oral care BID Follow up Recommendations:  (TBD) SLP Visit Diagnosis: Dysphagia, oropharyngeal phase (R13.12) Plan: Continue with current plan of care       Ethel Meisenheimer I. Hardin Negus, [place], [place] Office number [phone] Pager [phone]                Horton Marshall 05/14/2021, 5:50 PM

## 2021-05-14 NOTE — Evaluation (Signed)
Occupational Therapy Treatment Patient Details Name: [name] MRN: [id number] DOB: July 08, 1961 Today's Date: 05/14/2021    History of present illness 60 y.o. male presented with new onset of shortness of breath and hypoxia. +aspiration pneumonia requiring BiPAP  PMH- myotonic dystrophy, OSA, noncompliant with CPAP, hypothyroidism, chronic dysphagia, anxiety/depressio   OT comments  PTA patient was living with his spouse in a private residence with 2 steps to bathroom and was 1 person hand held assist from his wife with ADLs/IADLs and use of AE/DME. Patient is a questionable historian with history obtained from med chart. No family present at bedside at time of evaluation. Patient currently functioning below baseline demonstrating observed ADLs including LB dressing with Max A at bed level and need for +2 assist for functional transfers. Patient also limited by deficits listed below including cognitive deficits, generalized weakness, and decreased standing balance and would benefit from continued acute OT services in prep for safe d/c to next level of care. Patient would benefit from CIR level therapies to maximize safety and independence with self-care tasks in prep for safe return home with wife.       Follow Up Recommendations  CIR    Equipment Recommendations  None recommended by OT    Recommendations for Other Services      Precautions / Restrictions Precautions Precautions: Fall Precaution Comments: has fallen at home; son can usually get him up but wife cannot Restrictions Weight Bearing Restrictions: No       Mobility Bed Mobility Overal bed mobility: Needs Assistance Bed Mobility: Rolling;Sidelying to Sit Rolling: Mod assist Sidelying to sit: Mod assist       General bed mobility comments: Cues for use of bed rail, increased time/effort and assist to elevate trunk with HOB elevated.    Transfers Overall transfer level: Needs assistance Equipment used: Rolling  walker (2 wheeled);2 person hand held assist Transfers: Sit to/from [facility] Sit to Stand: Mod assist Stand pivot transfers: Mod assist;+2 physical assistance       General transfer comment: Mod A for    Balance Overall balance assessment: Needs assistance Sitting-balance support: No upper extremity supported;Feet supported Sitting balance-Leahy Scale: Fair     Standing balance support: Single extremity supported Standing balance-Leahy Scale: Poor Standing balance comment: requires UE support                           ADL either performed or assessed with clinical judgement   ADL Overall ADL's : Needs assistance/impaired                     Lower Body Dressing: Maximal assistance;Bed level Lower Body Dressing Details (indicate cue type and reason): Max A to don footwear in supine. Patient reports assist for donning LB clothing and footwear at baseline. Toilet Transfer: Moderate assistance;+2 for physical assistance;RW Toilet Transfer Details (indicate cue type and reason): Simulated with transfer to recliner with Mod A +2, RW, and increased time/effort. Patient quick to fatigue sitting prematurely despite cues.           General ADL Comments: Patient greatly limited by decreased cognition, decreased sitting/standing balance and need for +2 assist for LB ADLs and ADL transfers.     Vision Baseline Vision/History: Wears glasses Wears Glasses: Reading only Patient Visual Report: No change from baseline Vision Assessment?: No apparent visual deficits   Perception     Praxis      Cognition Arousal/Alertness: Awake/alert Behavior  During Therapy: Flat affect Overall Cognitive Status: Difficult to assess                                 General Comments: Patient A&Ox3. Unable to recall reason for admission. Noted STM deficits with some difficulty recalling level of assist required for ADLs PTA.        Exercises      Shoulder Instructions       General Comments      Pertinent Vitals/ Pain       Pain Assessment: Faces Faces Pain Scale: Hurts little more Pain Location: Low back Pain Descriptors / Indicators: Aching;Sore Pain Intervention(s): Monitored during session;Limited activity within patient's tolerance;Repositioned  Home Living Family/patient expects to be discharged to:: Private residence Living Arrangements: Spouse/significant other Available Help at Discharge: Family;Available 24 hours/day Type of Home: House Home Access: Ramped entrance     Home Layout: One level           Bathroom Accessibility: No   Home Equipment: Walker - 2 wheels;Wheelchair - power;Hospital bed;Grab bars - toilet   Additional Comments: stair lift      Prior Functioning/Environment Level of Independence: Needs assistance  Gait / Transfers Assistance Needed: transfers with assist (likes to pull up on wife's hand or walker); has to walk 2 steps into bathroom to get to toilet ADL's / Homemaking Assistance Needed: Patient reports assist with ADLs/IADLs at baseline including bathing/dressing and toileting. Patient reports ability to feed himself at baseline.       Frequency  Min 2X/week        Progress Toward Goals  OT Goals(current goals can now be found in the care plan section)     Acute Rehab OT Goals Patient Stated Goal: Per chart review wife wants pt strong enough to transfer and walk 2 steps into bathroom with her assist OT Goal Formulation: Patient unable to participate in goal setting Time For Goal Achievement: 05/28/21 Potential to Achieve Goals: Fair ADL Goals Pt Will Perform Eating: with set-up;sitting;with adaptive utensils Pt Will Perform Grooming: with set-up;sitting Pt Will Perform Upper Body Dressing: with min assist;sitting Pt Will Perform Lower Body Dressing: with mod assist;with adaptive equipment;sit to/from stand;sitting/lateral leans Pt Will Transfer to Toilet: with  min assist;ambulating Pt Will Perform Toileting - Clothing Manipulation and hygiene: with mod assist;sit to/from stand Pt/caregiver will Perform Home Exercise Program: Increased ROM;Increased strength;Both right and left upper extremity;With written HEP provided;With Supervision  Plan      Co-evaluation                 AM-PAC OT "6 Clicks" Daily Activity     Outcome Measure   Help from another person eating meals?: A Little Help from another person taking care of personal grooming?: A Lot Help from another person toileting, which includes using toliet, bedpan, or urinal?: Total Help from another person bathing (including washing, rinsing, drying)?: Total Help from another person to put on and taking off regular upper body clothing?: A Lot Help from another person to put on and taking off regular lower body clothing?: Total 6 Click Score: 10    End of Session Equipment Utilized During Treatment: Gait belt;Rolling walker  OT Visit Diagnosis: Unsteadiness on feet (R26.81);Muscle weakness (generalized) (M62.81);History of falling (Z91.81)   Activity Tolerance Patient tolerated treatment well;Patient limited by fatigue   Patient Left in chair;with call bell/phone within reach;with chair alarm set   Nurse Communication Mobility  status;Other (comment) (Need for RW and +2 assist)        Time: 9629-5284 OT Time Calculation (min): 20 min  Charges: OT General Charges $OT Visit: 1 Visit OT Evaluation $OT Eval Moderate Complexity: 1 Mod  Christyan Reger H. OTR/L Supplemental OT, Department of rehab services [phone]   Kairi Harshbarger R H. 05/14/2021, 11:22 AM

## 2021-05-15 DIAGNOSIS — R1312 Dysphagia, oropharyngeal phase: Secondary | ICD-10-CM

## 2021-05-15 LAB — COMPREHENSIVE METABOLIC PANEL
ALT: 31 U/L (ref 0–44)
AST: 30 U/L (ref 15–41)
Albumin: 2.6 g/dL — ABNORMAL LOW (ref 3.5–5.0)
Alkaline Phosphatase: 47 U/L (ref 38–126)
Anion gap: 6 (ref 5–15)
BUN: 6 mg/dL (ref 6–20)
CO2: 31 mmol/L (ref 22–32)
Calcium: 8.8 mg/dL — ABNORMAL LOW (ref 8.9–10.3)
Chloride: 103 mmol/L (ref 98–111)
Creatinine, Ser: 0.71 mg/dL (ref 0.61–1.24)
GFR, Estimated: >60 mL/min (ref >60)
Glucose, Bld: 97 mg/dL (ref 70–99)
Potassium: 3.9 mmol/L (ref 3.5–5.1)
Sodium: 140 mmol/L (ref 135–145)
Total Bilirubin: 0.7 mg/dL (ref 0.3–1.2)
Total Protein: 5.5 g/dL — ABNORMAL LOW (ref 6.5–8.1)

## 2021-05-15 LAB — CBC
HCT: 40.9 % (ref 39.0–52.0)
Hemoglobin: 12.7 g/dL — ABNORMAL LOW (ref 13.0–17.0)
MCH: 30.3 pg (ref 26.0–34.0)
MCHC: 31.1 g/dL (ref 30.0–36.0)
MCV: 97.6 fL (ref 80.0–100.0)
Platelets: 116 10*3/uL — ABNORMAL LOW (ref 150–400)
RBC: 4.19 MIL/uL — ABNORMAL LOW (ref 4.22–5.81)
RDW: 14.9 % (ref 11.5–15.5)
WBC: 3.2 10*3/uL — ABNORMAL LOW (ref 4.0–10.5)
nRBC: 0 % (ref 0.0–0.2)

## 2021-05-15 MED ORDER — POLYETHYLENE GLYCOL 3350 17 G PO PACK
17.0000 g | PACK | Freq: Every day | ORAL | Status: DC
  Administered 2021-05-15 – 2021-05-19 (×4): 17 g via ORAL
  Filled 2021-05-15 (×5): qty 1

## 2021-05-15 NOTE — Progress Notes (Signed)
Pharmacy Antibiotic Note  [name] is a 60 y.o. male admitted on 05/12/2021 with aspiration pneumonia.  Pharmacy has been consulted for Unasyn dosing. Presented with generalized weakness and increased secretion. Patient PMH significant for mytonic dystrophy and other aspiration events in the past.   Today, patient afebrile with WBC 3.2. Scr remains stable <1. Day 4 of antibiotics. Cultures remain negative   Plan: Continue Unasyn 3G Q8H  - Monitor renal function, clinical status, and treatment plan  - F/u LOT, GI recommendations       Temp (24hrs), Avg:98.4 F (36.9 C), Min:98.3 F (36.8 C), Max:98.4 F (36.9 C)  Recent Labs  Lab 05/12/21 1415 05/12/21 1427 05/12/21 1817 05/12/21 2156 05/13/21 0227 05/14/21 0407 05/15/21 0305  WBC 5.1  --   --   --   --  4.9 3.2*  CREATININE 0.96 0.80  --   --  0.89 0.72 0.71  LATICACIDVEN  --   --  1.5 3.5*  --   --   --     CrCl cannot be calculated (Unknown ideal weight.).    No Known Allergies  Antimicrobials this admission: Zosyn 5/18 x1  Unasyn 5/18 >>    Dose adjustments this admission: NA  Microbiology results: 5/18 BCx: ngtd  Thank you for allowing pharmacy to be a part of this patient's care.  Claudina Lick, PharmD PGY1 Acute Care Pharmacy Resident 05/15/2021 9:05 AM  Please check [email or web address] for unit-specific pharmacy phone numbers.

## 2021-05-15 NOTE — Progress Notes (Signed)
Patient refused to wear BiPap. Tried multiple times. With supplemental oxygen at 2 lpm via [place] with Sp02 = 97%, Not in distress. Informed RT.

## 2021-05-15 NOTE — Consult Note (Signed)
Consultation  Referring Provider: [facility]/Dr. Wyline Copas primary Care Physician:  Tammi Sou, MD Primary Gastroenterologist:  Dr. Zenia Resides  Reason for Consultation: Chronic dysphagia, esophageal stasis noted with speech path eval  HPI: [name] is a 60 y.o. male with history of myotonic dystrophy, obstructive sleep apnea, hypothyroidism, anxiety, fatty liver disease and history of chronic dysphagia dating back several years. He was admitted on 05/12/2021 with family concerned about lethargy, increased confusion and shortness of breath.  Patient was found to be hypoxic, and further work-up in the ER, consistent with aspiration pneumonia. Chest x-ray on 518 showed low lung volumes with bibasilar subsegmental atelectasis or scarring. He has been requiring oxygen, is on IV antibiotics and has been improving. He underwent speech path eval yesterday with finding of significant oral pharyngeal dysphagia with weak bolus manipulation, prolonged mastication and reduction and lingual retraction and pharyngeal constriction, epiglottic inversion is incomplete, he has resolved residue in the vallecula and piriform sinuses and posterior pharyngeal wall.  Penetration was noted consistently with thin liquids via cup and straw which resulted in intermittent trace silent aspiration.  Esophageal screening revealed stasis of barium in the cervical and thoracic esophagus similar to what was seen in 2018.  Patient has not had prior EGD.  He did have a very remote barium swallow done in 2007 at which time he aspirated and exam was not complete though no stricture noted  Patient has been recommended for n.p.o. and feeding tube placement in the past and has adamantly declined which she continues to do so.  He continues to eat regular food at home. He is currently recommended for dysphagia 3 diet thin liquids cup no straw.    Past Medical History:  Diagnosis Date  . Adult ADHD   . Aspiration pneumonitis  ([place]) 04/2017, 08/2017   Recurrent  . Chronic respiratory failure ([place])    As of 04/2018, Dr. Chase Caller suspects hypercapnic RF--but [name] normal.  May eventually need nocturnal noninvasive ventilation.  Ultimately, if progresses enough pt will need trach.  . Constipation, chronic    Has caused irritative urinary/bladder symptoms in the past.  . Depression   . Dizziness 06/2011+   Noncontrast CT and noncontrast MRI both negative 06/2011 at [facility] ED visit.  . Elevated transaminase level    Liver biopsy revealed fatty liver  . Erectile dysfunction   . Facial fracture ([place]) 06/2011   Nondisplaced bilateral [place] I fracture: no surgery required. (ENT, Dr. Wilburn Cornelia)   . Fatty liver disease, nonalcoholic   . GERD (gastroesophageal reflux disease)   . History of thyroid cancer 2010   Medullary carcinoma of thyroid. Thyroidectomy 08/2009 (Dr. Aida Puffer at [facility]).  Needs periodic serum calcitonin monitoring.  Marland Kitchen Hyperlipidemia   . Hypothyroidism   . LVH (left ventricular hypertrophy)    by EKG criteria 06/2011.  Hx of heart murmur--Followed by cardiology at [facility]: EF's have been "stable" per cardio office note 12/29/10.  His echo 12/2012 showed normal LV size and wall thickness but EF 45-50% and global LV hypokinesis was found.  . Myotonic dystrophy ([place]) Dx'd 1996   Progressive muscle weakness and swallowing dysfunction.  Initial dx by Dr. Jannifer Franklin, neurologist, but subsequent annual specialist f/u has been with Dr. Ladon Applebaum [facility].  Progressive debilitation--PT referral 04/2017, pt quit PT 05/2017.  . Nephrolithiasis   . Obesity, Class I, BMI 30-34.9   . OSA (obstructive sleep apnea)    SEVERE 08/2019 sleep study). CPAP to be initiated as of 04/18/20 pulm (Dr.  Young)  . Pulmonary nodule, right 05/2017   RUL, 7 mm sub solid nodule.  Resolution noted on f/u CT 08/2017.    Past Surgical History:  Procedure Laterality Date  . COLONOSCOPY  09/13/2007   normal (Dr. Deatra Ina).  . EXTRACORPOREAL SHOCK WAVE  LITHOTRIPSY    . Home sleep study  08/31/2019   Severe OSA  . THYROIDECTOMY  08/2009   for medullary thyroid cancer (Dr. Conley Canal at [facility] follows him for this).  . TRANSTHORACIC ECHOCARDIOGRAM  12/2012; 09/2017   2014-Normal LV size and wall thickness, LV EF 45-50%, with global hypokinesis of LV.  2018- EF 50-55%, normal wall motion, grd I DD, valves normal.    Prior to Admission medications   Medication Sig Start Date End Date Taking? Authorizing Provider  acetaminophen (TYLENOL) 500 MG tablet Take 1,000 mg by mouth at bedtime.   Yes [provider]  amphetamine-dextroamphetamine (ADDERALL XR) 10 MG 24 hr capsule TAKE 1 CAPSULE BY MOUTH ONCE DAILY (TO BE TAKEN WITH 30 MG XR CAPS) Patient taking differently: Take 10 mg by mouth daily. 04/16/21 10/13/21 Yes [name], [name], MD  amphetamine-dextroamphetamine (ADDERALL XR) 30 MG 24 hr capsule TAKE 1 CAPSULE BY MOUTH ONCE DAILY Patient taking differently: Take 30 mg by mouth daily. 04/16/21 10/13/21 Yes [name], [name], MD  clotrimazole-betamethasone (LOTRISONE) cream APPLY TO THE AFFECTED AREA(S) TWICE DAILY AS NEEDED Patient taking differently: Apply 1 application topically 2 (two) times daily as needed (to affected areas). 09/07/20  Yes [name], [name], MD  FLUoxetine (PROZAC) 40 MG capsule TAKE 1 CAPSULE BY MOUTH ONCE DAILY Patient taking differently: Take 40 mg by mouth daily. 03/08/21 03/08/22 Yes [name], [name], MD  glycopyrrolate (ROBINUL) 2 MG tablet TAKE 1/2 TO 1 TABLET BY MOUTH TWO TIMES DAILY AS NEEDED FOR EXCESSIVE ORAL SECRETIONS Patient taking differently: Take 1-2 mg by mouth See admin instructions. Take 2 mg by mouth at bedtime and an additional 1-2 mg once a day as needed FOR EXCESSIVE ORAL SECRETIONS 09/21/20  Yes [name], [name], MD  levothyroxine (SYNTHROID) 150 MCG tablet TAKE 1 TABLET BY MOUTH ONCE DAILY EXCEPT FOR SUNDAY SKIP DOSE Patient taking differently: Take 150 mcg by mouth See admin instructions. Take  150 mcg by mouth in the morning before breakfast on Mon/Tues/Wed/Thurs/Fri/Sat- skip Sundays 02/18/21 02/18/22 Yes [name], [name], MD  Melatonin 10 MG CAPS Take 10 mg by mouth at bedtime.   Yes [provider]  omeprazole (PRILOSEC) 40 MG capsule TAKE 1 CAPSULE BY MOUTH ONCE DAILY Patient taking differently: Take 40 mg by mouth daily before breakfast. 06/01/20 06/01/21 Yes [name], [name], MD  ondansetron (ZOFRAN) 8 MG tablet Take 1 tablet (8 mg total) by mouth every 8 (eight) hours as needed for nausea or vomiting. 07/25/19  Yes [name], [name], MD  Simethicone 125 MG TABS Take 125 mg by mouth every 6 (six) hours as needed for flatulence.   Yes [provider]  traZODone (DESYREL) 100 MG tablet Take 2 tablets by mouth daily at bedtime. Patient taking differently: Take 200 mg by mouth at bedtime. 03/29/21  Yes [name], [name], MD  acetaminophen (TYLENOL) 325 MG tablet Take 1-2 tablets (325-650 mg total) by mouth every 4 (four) hours as needed for mild pain. Patient not taking: No sig reported 11/06/18   Love, Ivan Anchors, PA-C  chlorhexidine (PERIDEX) 0.12 % solution USE 15ML TO SWISH FOR 2 FULL MINUTES THEN SPIT OUT--USE TWICE DAILY Patient not taking: No sig reported 12/21/20 12/21/21  Feliberto Harts R  COVID-19 mRNA vaccine, Pfizer, 30 MCG/0.3ML injection INJECT AS DIRECTED Patient not taking: Reported on 05/12/2021 12/11/20 12/11/21  Carlyle Basques, MD  HYDROcodone-acetaminophen (NORCO/VICODIN) 5-325 MG tablet TAKE 1 TABLET BY MOUTH EVERY 4 TO 6 HOURS AS NEEDED FOR PAIN. Patient not taking: No sig reported 12/09/20 06/07/21  Feliberto Harts R  ibuprofen (ADVIL) 600 MG tablet Take 1 tablet (600 mg total) by mouth every 6 (six) hours as needed. Patient not taking: No sig reported 04/01/21   Melynda Ripple, MD  penicillin v potassium (VEETID) 500 MG tablet TAKE 1 TABLET BY MOUTH 4 TIMES DAILY UNTIL FINISHED Patient not taking: No sig reported 12/09/20 12/09/21  Feliberto Harts R    Current  Facility-Administered Medications  Medication Dose Route Frequency Provider Last Rate Last Admin  . 0.9 %  sodium chloride infusion   Intravenous Continuous Donne Hazel, MD 75 mL/hr at 05/14/21 2138 New Bag at 05/14/21 2138  . acetaminophen (TYLENOL) tablet 1,000 mg  1,000 mg Oral QHS Wynetta Fines T, MD   1,000 mg at 05/14/21 2132  . acetaminophen (TYLENOL) tablet 650 mg  650 mg Oral Q6H PRN Donne Hazel, MD      . albuterol (PROVENTIL) (2.5 MG/3ML) 0.083% nebulizer solution 2.5 mg  2.5 mg Nebulization Q4H PRN Wynetta Fines T, MD      . amphetamine-dextroamphetamine (ADDERALL XR) 24 hr capsule 40 mg  40 mg Oral Daily Wynetta Fines T, MD   40 mg at 05/15/21 [id number]  . Ampicillin-Sulbactam (UNASYN) 3 g in sodium chloride 0.9 % 100 mL IVPB  3 g Intravenous Q8H Llana Aliment, RPH 200 mL/hr at 05/15/21 0611 3 g at 05/15/21 [id number]  . enoxaparin (LOVENOX) injection 40 mg  40 mg Subcutaneous Q24H Wynetta Fines T, MD   40 mg at 05/14/21 1715  . FLUoxetine (PROZAC) capsule 40 mg  40 mg Oral Daily Wynetta Fines T, MD   40 mg at 05/15/21 0814  . glycopyrrolate (ROBINUL) tablet 1-2 mg  1-2 mg Oral BID PRN Wynetta Fines T, MD      . levothyroxine (SYNTHROID) tablet 150 mcg  150 mcg Oral Once per day on Mon Tue Wed Thu Fri Sat Lequita Halt, MD   150 mcg at 05/15/21 [phone]  . melatonin tablet 10 mg  10 mg Oral QHS Wynetta Fines T, MD   10 mg at 05/14/21 2133  . ondansetron (ZOFRAN) tablet 8 mg  8 mg Oral Q8H PRN Wynetta Fines T, MD      . pantoprazole (PROTONIX) EC tablet 40 mg  40 mg Oral Daily Wynetta Fines T, MD   40 mg at 05/15/21 0814  . traZODone (DESYREL) tablet 200 mg  200 mg Oral QHS Wynetta Fines T, MD   200 mg at 05/14/21 2132    Allergies as of 05/12/2021  . (No Known Allergies)    Family History  Problem Relation Age of Onset  . Arthritis Mother   . Hypertension Mother   . Alcohol abuse Father   . Arthritis Father   . Hypertension Father   . Colon cancer Father   . Prostate cancer Father   . Heart  disease Brother     Social History   Socioeconomic History  . Marital status: Married    Spouse name: Damein Battiest  . Number of children: 1  . Years of education: College  . Highest education level: Bachelor's degree (e.g., BA, AB, BS)  Occupational History  . Occupation: Disabled  Tobacco Use  . Smoking status: Never Smoker  . Smokeless tobacco: Never Used  Vaping Use  . Vaping Use: Never used  Substance and Sexual Activity  . Alcohol use: Yes    Comment: rare beer  . Drug use: No  . Sexual activity: Not Currently  Other Topics Concern  . Not on file  Social History Narrative   Married, one son [name]).   Occupation: Market researcher, currently unemployed, working on appeal for medical disability as of 11/2011.  No tob or drugs.  Drinks alcohol socially.   Social Determinants of Health   Financial Resource Strain: Low Risk   . Difficulty of Paying Living Expenses: Not hard at all  Food Insecurity: No Food Insecurity  . Worried About Charity fundraiser in the Last Year: Never true  . Ran Out of Food in the Last Year: Never true  Transportation Needs: No Transportation Needs  . Lack of Transportation (Medical): No  . Lack of Transportation (Non-Medical): No  Physical Activity: Inactive  . Days of Exercise per Week: 0 days  . Minutes of Exercise per Session: 0 min  Stress: Stress Concern Present  . Feeling of Stress : Rather much  Social Connections: Moderately Integrated  . Frequency of Communication with Friends and Family: More than three times a week  . Frequency of Social Gatherings with Friends and Family: More than three times a week  . Attends Religious Services: More than 4 times per year  . Active Member of Clubs or Organizations: No  . Attends Archivist Meetings: Never  . Marital Status: Married  Human resources officer Violence: Not At Risk  . Fear of Current or Ex-Partner: No  . Emotionally Abused: No  . Physically Abused: No  . Sexually  Abused: No    Review of Systems: Pertinent positive and negative review of systems were noted in the above HPI section.  All other review of systems was otherwise negative.  Physical Exam: Vital signs in last 24 hours: Temp:  [98.4 F (36.9 C)] 98.4 F (36.9 C) (05/21 0518) Pulse Rate:  [91] 91 (05/21 0518) Resp:  [18] 18 (05/21 0518) BP: (129)/(76) 129/76 (05/21 0518) SpO2:  [96 %] 96 % (05/21 0518) Last BM Date: 05/12/21 General:   Alert,  Well-developed, chronically ill-appearing, pleasant and cooperative in NAD.  Son at bedside, patient speech difficult to understand Head:  Normocephalic and atraumatic. Eyes:  Sclera clear, no icterus.   Conjunctiva pink. Ears:  Normal auditory acuity. Nose:  No deformity, discharge,  or lesions. Mouth:  No deformity or lesions.   Neck:  Supple; no masses or thyromegaly. Lungs: Decreased breath sounds bilateral bases . Heart:  Regular rate and rhythm; no murmurs, clicks, rubs,  or gallops. Abdomen:  Soft,nontender, BS active,nonpalp mass or hsm.   Rectal:  Deferred  Msk:  Symmetrical without gross deformities. . Pulses:  Normal pulses noted. Extremities:  Without clubbing or edema. Neurologic:  Alert and  oriented x4; Skin:  Intact without significant lesions or rashes.. Psych:  Alert and cooperative. Normal mood and affect.  Intake/Output from previous day: 05/20 0701 - 05/21 0700 In: 1966.1 [P.O.:600; I.V.:850.5; IV Piggyback:515.6] Out: 1800 [Urine:1800] Intake/Output this shift: No intake/output data recorded.  Lab Results: Recent Labs    05/12/21 1415 05/12/21 1427 05/14/21 0407 05/15/21 0305  WBC 5.1  --  4.9 3.2*  HGB 14.9 16.0  16.3 14.0 12.7*  HCT 49.3 47.0  48.0 44.9 40.9  PLT 145*  --  124*  116*   BMET Recent Labs    05/13/21 0227 05/14/21 0407 05/15/21 0305  NA 139 138 140  K 4.2 3.2* 3.9  CL 100 99 103  CO2 30 33* 31  GLUCOSE 82 104* 97  BUN 10 6 6   CREATININE 0.89 0.72 0.71  CALCIUM 9.1 8.8* 8.8*    LFT Recent Labs    05/15/21 0305  PROT 5.5*  ALBUMIN 2.6*  AST 30  ALT 31  ALKPHOS 47  BILITOT 0.7   PT/INR Recent Labs    05/12/21 1415  LABPROT 13.8  INR 1.1   Hepatitis Panel No results for input(s): HEPBSAG, HCVAB, HEPAIGM, HEPBIGM in the last 72 hours.    IMPRESSION:  #79  60 year old white male admitted with aspiration pneumonia in setting of chronic myotonic dystrophy.  #2 chronic dysphagia present over the past several years secondary to above. Patient has declined prior recommendation for n.p.o. and feeding tube, and continues to decline.  He eats a regular soft diet at home  #3 abnormal speech path eval this admission with finding of esophageal stasis, rule out esophageal dysmotility, rule out possible stricture.  Suspect chronic esophageal dysmotility associated with his neuromuscular disorder.  #4 thrombocytopenia #5 sleep apnea #6   Hypothyroidism #7 anxiety  Plan; diet as per speech path recommendations Patient will be scheduled for regular barium swallow with tablet for further diagnostic evaluation of the esophagus. Decision regarding need for any endoscopic evaluation will be based on barium swallow findings. Radiology has informed us that barium swallow cannot be done until Monday.  GI will plan to follow-up on Monday     Ilea Hilton EsterwoodPA-C  05/15/2021, 1:48 PM

## 2021-05-15 NOTE — Progress Notes (Signed)
PROGRESS NOTE    ESTANISLADO SURGEON  QQP:[id number] DOB: [birth year] DOA: 05/12/2021 PCP: Tammi Sou, MD    Brief Narrative:  60 y.o. male with medical history significant of myotonic dystrophy, OSA, noncompliant with CPAP, hypothyroidism, chronic dysphagia, anxiety/depression, presented with new onset of shortness of breath and hypoxia  Assessment & Plan:   Active Problems:   Aspiration pneumonia (HCC)   Aspiration into airway  Acute, probably on chronic hypoxic and hypercapnic respiratory failure -concerns were noted with aspiration and noncompliant with CPAP machine for his OSA -Pt had been continued on Unasyn -Wean O2 as tolerated, noted to remain on high flow. Patient has been refusing nocturnal bipap despite recommendations -SLP was consulted. Pt is now s/p MBS with findings notable for stasis of barium in cervical and thoracic esophagus with recs for esophogram vs GI consult -Continued on dysphagia 3 with thin liquids per SLP -Appreciate GI recs. Recommendation for formal barium study and decision on endoscopy pending results of study  Resting tremors -TSH of 0.098. Free T4 is within normal limits at 1.63 -May need to adjust Adderall doses, follow-up with outpatient neurology. -CK noted to be unremarkable  Dehydration -Pt had been continued on IVF hydration  Myotonic dystrophy -Several family members suffers from the conditions, noted.  Hypothyroidism -TSH of 0.098 -T4 within normal limits per above  Ambulation dysfunction -PT evaluated. Recommendations were noted for CIR -CIR is following  DVT prophylaxis: Lovenox subq Code Status: Partial Family Communication: Pt in room, family is currently at bedside  Status is: Inpatient  Remains inpatient appropriate because:Inpatient level of care appropriate due to severity of illness   Dispo: The patient is from: Home              Anticipated d/c is to: Home              Patient currently is not  medically stable to d/c.   Difficult to place patient No    Consultants:   GI  CIR  Procedures:     Antimicrobials: Anti-infectives (From admission, onward)   Start     Dose/Rate Route Frequency Ordered Stop   05/12/21 2200  Ampicillin-Sulbactam (UNASYN) 3 g in sodium chloride 0.9 % 100 mL IVPB        3 g 200 mL/hr over 30 Minutes Intravenous Every 8 hours 05/12/21 1737     05/12/21 1500  piperacillin-tazobactam (ZOSYN) IVPB 3.375 g  Status:  Discontinued        3.375 g 12.5 mL/hr over 240 Minutes Intravenous Every 8 hours 05/12/21 1447 05/12/21 1725   05/12/21 1230  piperacillin-tazobactam (ZOSYN) IVPB 3.375 g  Status:  Discontinued        3.375 g 100 mL/hr over 30 Minutes Intravenous  Once 05/12/21 1219 05/12/21 1447      Subjective: Without complaints this AM  Objective: Vitals:   05/13/21 1944 05/14/21 0446 05/14/21 1124 05/15/21 0518  BP: 110/75 123/68 (!) 102/50 129/76  Pulse: 100 87 96 91  Resp: 18 20 17 18   Temp: 97.8 F (36.6 C) 98 F (36.7 C) 98.3 F (36.8 C) 98.4 F (36.9 C)  TempSrc: Oral Oral  Oral  SpO2: 100% 98%  96%    Intake/Output Summary (Last 24 hours) at 05/15/2021 1537 Last data filed at 05/15/2021 0500 Gross per 24 hour  Intake 1966.14 ml  Output 1800 ml  Net 166.14 ml   There were no vitals filed for this visit.  Examination: General exam: Awake, laying in bed, in  nad Respiratory system: increased respiratory effort, active coughing Cardiovascular system: regular rate, s1, s2 Gastrointestinal system: Soft, nondistended, positive BS Central nervous system: CN2-12 grossly intact, strength intact Extremities: Perfused, no clubbing Skin: Normal skin turgor, no notable skin lesions seen Psychiatry: Mood normal // no visual hallucinations   Data Reviewed: I have personally reviewed following labs and imaging studies  CBC: Recent Labs  Lab 05/12/21 1415 05/12/21 1427 05/14/21 0407 05/15/21 0305  WBC 5.1  --  4.9 3.2*   NEUTROABS 3.4  --   --   --   HGB 14.9 16.0  16.3 14.0 12.7*  HCT 49.3 47.0  48.0 44.9 40.9  MCV 100.4*  --  98.7 97.6  PLT 145*  --  124* 480*   Basic Metabolic Panel: Recent Labs  Lab 05/12/21 1415 05/12/21 1427 05/13/21 0227 05/14/21 0407 05/15/21 0305  NA 139 139  139 139 138 140  K 5.4* 4.3  4.3 4.2 3.2* 3.9  CL 99 99 100 99 103  CO2 34*  --  30 33* 31  GLUCOSE 92 95 82 104* 97  BUN 12 13 10 6 6   CREATININE 0.96 0.80 0.89 0.72 0.71  CALCIUM 9.1  --  9.1 8.8* 8.8*   GFR: CrCl cannot be calculated (Unknown ideal weight.). Liver Function Tests: Recent Labs  Lab 05/12/21 1415 05/14/21 0407 05/15/21 0305  AST 48* 32 30  ALT 36 32 31  ALKPHOS 63 56 47  BILITOT 1.2 0.8 0.7  PROT 6.8 6.2* 5.5*  ALBUMIN 3.5 3.0* 2.6*   No results for input(s): LIPASE, AMYLASE in the last 168 hours. No results for input(s): AMMONIA in the last 168 hours. Coagulation Profile: Recent Labs  Lab 05/12/21 1415  INR 1.1   Cardiac Enzymes: Recent Labs  Lab 05/12/21 1817  CKTOTAL 127   BNP (last 3 results) No results for input(s): PROBNP in the last 8760 hours. HbA1C: No results for input(s): HGBA1C in the last 72 hours. CBG: No results for input(s): GLUCAP in the last 168 hours. Lipid Profile: No results for input(s): CHOL, HDL, LDLCALC, TRIG, CHOLHDL, LDLDIRECT in the last 72 hours. Thyroid Function Tests: Recent Labs    05/12/21 1817  TSH 0.098*   Anemia Panel: No results for input(s): VITAMINB12, FOLATE, FERRITIN, TIBC, IRON, RETICCTPCT in the last 72 hours. Sepsis Labs: Recent Labs  Lab 05/12/21 1817 05/12/21 2156  LATICACIDVEN 1.5 3.5*    Recent Results (from the past 240 hour(s))  Culture, blood (routine x 2)     Status: None (Preliminary result)   Collection Time: 05/12/21 11:45 AM   Specimen: BLOOD  Result Value Ref Range Status   Specimen Description BLOOD LEFT ANTECUBITAL  Final   Special Requests   Final    BOTTLES DRAWN AEROBIC AND ANAEROBIC  Blood Culture results may not be optimal due to an inadequate volume of blood received in culture bottles   Culture   Final    NO GROWTH 2 DAYS Performed at [facility] [address] [place], [place] [address]    Report Status PENDING  Incomplete  Resp Panel by RT-PCR (Flu A&B, Covid) Nasopharyngeal Swab     Status: None   Collection Time: 05/12/21 11:59 AM   Specimen: Nasopharyngeal Swab; Nasopharyngeal(NP) swabs in vial transport medium  Result Value Ref Range Status   SARS Coronavirus 2 by RT PCR NEGATIVE NEGATIVE Final    Comment: (NOTE) SARS-CoV-2 target nucleic acids are NOT DETECTED.  The SARS-CoV-2 RNA is generally detectable  in upper respiratory specimens during the acute phase of infection. The lowest concentration of SARS-CoV-2 viral copies this assay can detect is 138 copies/mL. A negative result does not preclude SARS-Cov-2 infection and should not be used as the sole basis for treatment or other patient management decisions. A negative result may occur with  improper specimen collection/handling, submission of specimen other than nasopharyngeal swab, presence of viral mutation(s) within the areas targeted by this assay, and inadequate number of viral copies(<138 copies/mL). A negative result must be combined with clinical observations, patient history, and epidemiological information. The expected result is Negative.  Fact Sheet for Patients:  EntrepreneurPulse.com.au  Fact Sheet for Healthcare Providers:  IncredibleEmployment.be  This test is no t yet approved or cleared by the [place] FDA and  has been authorized for detection and/or diagnosis of SARS-CoV-2 by FDA under an Emergency Use Authorization (EUA). This EUA will remain  in effect (meaning this test can be used) for the duration of the COVID-19 declaration under Section 564(b)(1) of the Act, 21 U.S.C.section 360bbb-3(b)(1), unless the authorization is  terminated  or revoked sooner.       Influenza A by PCR NEGATIVE NEGATIVE Final   Influenza B by PCR NEGATIVE NEGATIVE Final    Comment: (NOTE) The Xpert Xpress SARS-CoV-2/FLU/RSV plus assay is intended as an aid in the diagnosis of influenza from Nasopharyngeal swab specimens and should not be used as a sole basis for treatment. Nasal washings and aspirates are unacceptable for Xpert Xpress SARS-CoV-2/FLU/RSV testing.  Fact Sheet for Patients: EntrepreneurPulse.com.au  Fact Sheet for Healthcare Providers: IncredibleEmployment.be  This test is not yet approved or cleared by the [place] FDA and has been authorized for detection and/or diagnosis of SARS-CoV-2 by FDA under an Emergency Use Authorization (EUA). This EUA will remain in effect (meaning this test can be used) for the duration of the COVID-19 declaration under Section 564(b)(1) of the Act, 21 U.S.C. section 360bbb-3(b)(1), unless the authorization is terminated or revoked.  Performed at [facility], [place] [address] [place], [place] [address]   Culture, blood (routine x 2)     Status: None (Preliminary result)   Collection Time: 05/12/21 10:00 PM   Specimen: BLOOD  Result Value Ref Range Status   Specimen Description BLOOD RIGHT HAND  Final   Special Requests   Final    BOTTLES DRAWN AEROBIC ONLY Blood Culture results may not be optimal due to an inadequate volume of blood received in culture bottles   Culture   Final    NO GROWTH 2 DAYS Performed at [facility], [place] [address] [place], [place] [address]    Report Status PENDING  Incomplete     Radiology Studies: No results found.  Scheduled Meds: . acetaminophen  1,000 mg Oral QHS  . amphetamine-dextroamphetamine  40 mg Oral Daily  . enoxaparin (LOVENOX) injection  40 mg Subcutaneous Q24H  . FLUoxetine  40 mg Oral Daily  . levothyroxine  150 mcg Oral Once per day on Mon Tue Wed Thu Fri Sat  .  melatonin  10 mg Oral QHS  . pantoprazole  40 mg Oral Daily  . polyethylene glycol  17 g Oral Daily  . traZODone  200 mg Oral QHS   Continuous Infusions: . sodium chloride 75 mL/hr at 05/14/21 2138  . ampicillin-sulbactam (UNASYN) IV 3 g (05/15/21 1445)     LOS: 3 days   Marylu Lund, MD [facility] Pager On Amion  If 7PM-7AM, please contact  night-coverage 05/15/2021, 3:37 PM

## 2021-05-15 NOTE — Plan of Care (Signed)
  Problem: Clinical Measurements: Goal: Respiratory complications will improve Outcome: Progressing   Problem: Clinical Measurements: Goal: Cardiovascular complication will be avoided Outcome: Progressing   Problem: Activity: Goal: Risk for activity intolerance will decrease Outcome: Progressing   Problem: Nutrition: Goal: Adequate nutrition will be maintained Outcome: Progressing   Problem: Elimination: Goal: Will not experience complications related to bowel motility Outcome: Progressing Goal: Will not experience complications related to urinary retention Outcome: Progressing   Problem: Pain Managment: Goal: General experience of comfort will improve Outcome: Progressing   Problem: Safety: Goal: Ability to remain free from injury will improve Outcome: Progressing   Problem: Skin Integrity: Goal: Risk for impaired skin integrity will decrease Outcome: Progressing

## 2021-05-16 NOTE — Progress Notes (Signed)
PROGRESS NOTE    [name]  [id number] DOB: [birth year] DOA: 05/12/2021 PCP: Tammi Sou, MD    Brief Narrative:  60 y.o. male with medical history significant of myotonic dystrophy, OSA, noncompliant with CPAP, hypothyroidism, chronic dysphagia, anxiety/depression, presented with new onset of shortness of breath and hypoxia  Assessment & Plan:   Active Problems:   Aspiration pneumonia (HCC)   Aspiration into airway   Oropharyngeal dysphagia  Acute, probably on chronic hypoxic and hypercapnic respiratory failure -concerns were noted with aspiration and noncompliant with CPAP machine for his OSA -Pt had been continued on Unasyn -Wean O2 as tolerated, noted to remain on high flow. Patient has been refusing nocturnal bipap despite recommendations -SLP was consulted. Pt is now s/p MBS with findings notable for stasis of barium in cervical and thoracic esophagus with recs for esophogram vs GI consult -Continued on dysphagia 3 with thin liquids per SLP -Appreciate GI recs. Recommendation for formal barium study and decision on endoscopy pending results of study -Overnight events noted. Pt refused multiple times to wear bipap. Counseled this AM, pt now agreeable to wear tonight  Resting tremors -TSH of 0.098. Free T4 is within normal limits at 1.63 -May need to adjust Adderall doses, follow-up with outpatient neurology. -CK trends reviewed, neg  Dehydration -Pt had been continued on IVF hydration  Myotonic dystrophy -Several family members suffers from the conditions, noted.  Hypothyroidism -TSH of 0.098 -T4 noted to be within normal limits  Ambulation dysfunction -PT evaluated. Recommendations were noted for CIR -CR was consulted  DVT prophylaxis: Lovenox subq Code Status: Partial Family Communication: Pt in room, family is currently not at bedside  Status is: Inpatient  Remains inpatient appropriate because:Inpatient level of care appropriate due  to severity of illness   Dispo: The patient is from: Home              Anticipated d/c is to: Home              Patient currently is not medically stable to d/c.   Difficult to place patient No    Consultants:   GI  CIR  Procedures:     Antimicrobials: Anti-infectives (From admission, onward)   Start     Dose/Rate Route Frequency Ordered Stop   05/12/21 2200  Ampicillin-Sulbactam (UNASYN) 3 g in sodium chloride 0.9 % 100 mL IVPB        3 g 200 mL/hr over 30 Minutes Intravenous Every 8 hours 05/12/21 1737     05/12/21 1500  piperacillin-tazobactam (ZOSYN) IVPB 3.375 g  Status:  Discontinued        3.375 g 12.5 mL/hr over 240 Minutes Intravenous Every 8 hours 05/12/21 1447 05/12/21 1725   05/12/21 1230  piperacillin-tazobactam (ZOSYN) IVPB 3.375 g  Status:  Discontinued        3.375 g 100 mL/hr over 30 Minutes Intravenous  Once 05/12/21 1219 05/12/21 1447      Subjective: No complaints at this time  Objective: Vitals:   05/15/21 0518 05/15/21 2300 05/16/21 0543 05/16/21 1301  BP: 129/76 (!) 94/55 (!) 100/58 (!) 105/56  Pulse: 91 83 80 (!) 102  Resp: 18 18  17   Temp: 98.4 F (36.9 C) 99 F (37.2 C) 98 F (36.7 C) 99.7 F (37.6 C)  TempSrc: Oral Oral Oral   SpO2: 96% 97% 99% 90%    Intake/Output Summary (Last 24 hours) at 05/16/2021 1637 Last data filed at 05/16/2021 1300 Gross per 24 hour  Intake 3204.29  ml  Output 1900 ml  Net 1304.29 ml   There were no vitals filed for this visit.  Examination: General exam: Conversant, in no acute distress Respiratory system: normal chest rise, clear, no audible wheezing Cardiovascular system: regular rhythm, s1-s2 Gastrointestinal system: Nondistended, nontender, pos BS Central nervous system: No seizures, no tremors Extremities: No cyanosis, no joint deformities Skin: No rashes, no pallor Psychiatry: Affect normal // no auditory hallucinations   Data Reviewed: I have personally reviewed following labs and imaging  studies  CBC: Recent Labs  Lab 05/12/21 1415 05/12/21 1427 05/14/21 0407 05/15/21 0305  WBC 5.1  --  4.9 3.2*  NEUTROABS 3.4  --   --   --   HGB 14.9 16.0  16.3 14.0 12.7*  HCT 49.3 47.0  48.0 44.9 40.9  MCV 100.4*  --  98.7 97.6  PLT 145*  --  124* 676*   Basic Metabolic Panel: Recent Labs  Lab 05/12/21 1415 05/12/21 1427 05/13/21 0227 05/14/21 0407 05/15/21 0305  NA 139 139  139 139 138 140  K 5.4* 4.3  4.3 4.2 3.2* 3.9  CL 99 99 100 99 103  CO2 34*  --  30 33* 31  GLUCOSE 92 95 82 104* 97  BUN 12 13 10 6 6   CREATININE 0.96 0.80 0.89 0.72 0.71  CALCIUM 9.1  --  9.1 8.8* 8.8*   GFR: CrCl cannot be calculated (Unknown ideal weight.). Liver Function Tests: Recent Labs  Lab 05/12/21 1415 05/14/21 0407 05/15/21 0305  AST 48* 32 30  ALT 36 32 31  ALKPHOS 63 56 47  BILITOT 1.2 0.8 0.7  PROT 6.8 6.2* 5.5*  ALBUMIN 3.5 3.0* 2.6*   No results for input(s): LIPASE, AMYLASE in the last 168 hours. No results for input(s): AMMONIA in the last 168 hours. Coagulation Profile: Recent Labs  Lab 05/12/21 1415  INR 1.1   Cardiac Enzymes: Recent Labs  Lab 05/12/21 1817  CKTOTAL 127   BNP (last 3 results) No results for input(s): PROBNP in the last 8760 hours. HbA1C: No results for input(s): HGBA1C in the last 72 hours. CBG: No results for input(s): GLUCAP in the last 168 hours. Lipid Profile: No results for input(s): CHOL, HDL, LDLCALC, TRIG, CHOLHDL, LDLDIRECT in the last 72 hours. Thyroid Function Tests: No results for input(s): TSH, T4TOTAL, FREET4, T3FREE, THYROIDAB in the last 72 hours. Anemia Panel: No results for input(s): VITAMINB12, FOLATE, FERRITIN, TIBC, IRON, RETICCTPCT in the last 72 hours. Sepsis Labs: Recent Labs  Lab 05/12/21 1817 05/12/21 2156  LATICACIDVEN 1.5 3.5*    Recent Results (from the past 240 hour(s))  Culture, blood (routine x 2)     Status: None (Preliminary result)   Collection Time: 05/12/21 11:45 AM   Specimen:  BLOOD  Result Value Ref Range Status   Specimen Description BLOOD LEFT ANTECUBITAL  Final   Special Requests   Final    BOTTLES DRAWN AEROBIC AND ANAEROBIC Blood Culture results may not be optimal due to an inadequate volume of blood received in culture bottles   Culture   Final    NO GROWTH 4 DAYS Performed at [facility], [place] [address] [place], [place] [address]    Report Status PENDING  Incomplete  Resp Panel by RT-PCR (Flu A&B, Covid) Nasopharyngeal Swab     Status: None   Collection Time: 05/12/21 11:59 AM   Specimen: Nasopharyngeal Swab; Nasopharyngeal(NP) swabs in vial transport medium  Result Value Ref Range Status   SARS  Coronavirus 2 by RT PCR NEGATIVE NEGATIVE Final    Comment: (NOTE) SARS-CoV-2 target nucleic acids are NOT DETECTED.  The SARS-CoV-2 RNA is generally detectable in upper respiratory specimens during the acute phase of infection. The lowest concentration of SARS-CoV-2 viral copies this assay can detect is 138 copies/mL. A negative result does not preclude SARS-Cov-2 infection and should not be used as the sole basis for treatment or other patient management decisions. A negative result may occur with  improper specimen collection/handling, submission of specimen other than nasopharyngeal swab, presence of viral mutation(s) within the areas targeted by this assay, and inadequate number of viral copies(<138 copies/mL). A negative result must be combined with clinical observations, patient history, and epidemiological information. The expected result is Negative.  Fact Sheet for Patients:  EntrepreneurPulse.com.au  Fact Sheet for Healthcare Providers:  IncredibleEmployment.be  This test is no t yet approved or cleared by the [place] FDA and  has been authorized for detection and/or diagnosis of SARS-CoV-2 by FDA under an Emergency Use Authorization (EUA). This EUA will remain  in effect (meaning this test  can be used) for the duration of the COVID-19 declaration under Section 564(b)(1) of the Act, 21 U.S.C.section 360bbb-3(b)(1), unless the authorization is terminated  or revoked sooner.       Influenza A by PCR NEGATIVE NEGATIVE Final   Influenza B by PCR NEGATIVE NEGATIVE Final    Comment: (NOTE) The Xpert Xpress SARS-CoV-2/FLU/RSV plus assay is intended as an aid in the diagnosis of influenza from Nasopharyngeal swab specimens and should not be used as a sole basis for treatment. Nasal washings and aspirates are unacceptable for Xpert Xpress SARS-CoV-2/FLU/RSV testing.  Fact Sheet for Patients: EntrepreneurPulse.com.au  Fact Sheet for Healthcare Providers: IncredibleEmployment.be  This test is not yet approved or cleared by the [place] FDA and has been authorized for detection and/or diagnosis of SARS-CoV-2 by FDA under an Emergency Use Authorization (EUA). This EUA will remain in effect (meaning this test can be used) for the duration of the COVID-19 declaration under Section 564(b)(1) of the Act, 21 U.S.C. section 360bbb-3(b)(1), unless the authorization is terminated or revoked.  Performed at [facility], [place] [address] [place], [place] [address]   Culture, blood (routine x 2)     Status: None (Preliminary result)   Collection Time: 05/12/21 10:00 PM   Specimen: BLOOD  Result Value Ref Range Status   Specimen Description BLOOD RIGHT HAND  Final   Special Requests   Final    BOTTLES DRAWN AEROBIC ONLY Blood Culture results may not be optimal due to an inadequate volume of blood received in culture bottles   Culture   Final    NO GROWTH 4 DAYS Performed at [facility], [place] [address], [place], [place] [address]    Report Status PENDING  Incomplete     Radiology Studies: No results found.  Scheduled Meds: . acetaminophen  1,000 mg Oral QHS  . amphetamine-dextroamphetamine  40 mg Oral Daily  . enoxaparin  (LOVENOX) injection  40 mg Subcutaneous Q24H  . FLUoxetine  40 mg Oral Daily  . levothyroxine  150 mcg Oral Once per day on Mon Tue Wed Thu Fri Sat  . melatonin  10 mg Oral QHS  . pantoprazole  40 mg Oral Daily  . polyethylene glycol  17 g Oral Daily  . traZODone  200 mg Oral QHS   Continuous Infusions: . sodium chloride 75 mL/hr at 05/16/21 1419  . ampicillin-sulbactam (UNASYN)  IV 3 g (05/16/21 1414)     LOS: 4 days   Marylu Lund, MD [facility] Pager On Amion  If 7PM-7AM, please contact night-coverage 05/16/2021, 4:37 PM

## 2021-05-16 NOTE — Plan of Care (Signed)
  Problem: Coping: Goal: Level of anxiety will decrease Outcome: Progressing   Problem: Pain Managment: Goal: General experience of comfort will improve Outcome: Progressing   Problem: Safety: Goal: Ability to remain free from injury will improve Outcome: Progressing   

## 2021-05-16 NOTE — Progress Notes (Signed)
Pt placed on bipap for the night. °

## 2021-05-17 ENCOUNTER — Inpatient Hospital Stay (HOSPITAL_COMMUNITY): Payer: 59

## 2021-05-17 DIAGNOSIS — R1319 Other dysphagia: Secondary | ICD-10-CM

## 2021-05-17 LAB — CULTURE, BLOOD (ROUTINE X 2)
Culture: NO GROWTH
Culture: NO GROWTH

## 2021-05-17 NOTE — Progress Notes (Signed)
PROGRESS NOTE    [name]  [id number] DOB: 1961-04-25 DOA: 05/12/2021 PCP: Tammi Sou, MD    Brief Narrative:  60 y.o. male with medical history significant of myotonic dystrophy, OSA, noncompliant with CPAP, hypothyroidism, chronic dysphagia, anxiety/depression, presented with new onset of shortness of breath and hypoxia  Assessment & Plan:   Active Problems:   Aspiration pneumonia (HCC)   Aspiration into airway   Oropharyngeal dysphagia  Acute, probably on chronic hypoxic and hypercapnic respiratory failure -concerns were noted with aspiration and noncompliant with CPAP machine for his OSA -Pt had been continued on Unasyn -Wean O2 as tolerated, noted to remain on high flow. Patient has been refusing nocturnal bipap despite recommendations -SLP was consulted. Pt is now s/p MBS with findings notable for stasis of barium in cervical and thoracic esophagus with recs for esophogram vs GI consult -Continued on dysphagia 3 with thin liquids per SLP -GI was consulted per SLP recs. Pt is now s/p esophogram, reviewed. Findings suggestive of dysmotility without evidence of stricture -Pt did wear bipap overnight. Congratulated. Advised to continue wearing bipap  Resting tremors -TSH of 0.098. Free T4 is within normal limits at 1.63 -May need to adjust Adderall doses, follow-up with outpatient neurology. -CK trends have been unremarkable  Dehydration -Pt had been continued on IVF hydration  Myotonic dystrophy -Several family members suffers from the conditions, noted.  Hypothyroidism -TSH of 0.098 -T4 noted to be within normal limits  Ambulation dysfunction -PT evaluated. Recommendations were noted for CIR -CIR is following  DVT prophylaxis: Lovenox subq Code Status: Partial Family Communication: Pt in room, family is currently at bedside  Status is: Inpatient  Remains inpatient appropriate because:Inpatient level of care appropriate due to severity  of illness   Dispo: The patient is from: Home              Anticipated d/c is to: CIR              Patient currently is not medically stable to d/c.   Difficult to place patient No    Consultants:   GI  CIR  Procedures:     Antimicrobials: Anti-infectives (From admission, onward)   Start     Dose/Rate Route Frequency Ordered Stop   05/12/21 2200  Ampicillin-Sulbactam (UNASYN) 3 g in sodium chloride 0.9 % 100 mL IVPB        3 g 200 mL/hr over 30 Minutes Intravenous Every 8 hours 05/12/21 1737     05/12/21 1500  piperacillin-tazobactam (ZOSYN) IVPB 3.375 g  Status:  Discontinued        3.375 g 12.5 mL/hr over 240 Minutes Intravenous Every 8 hours 05/12/21 1447 05/12/21 1725   05/12/21 1230  piperacillin-tazobactam (ZOSYN) IVPB 3.375 g  Status:  Discontinued        3.375 g 100 mL/hr over 30 Minutes Intravenous  Once 05/12/21 1219 05/12/21 1447      Subjective: Without complaints this AM  Objective: Vitals:   05/15/21 2300 05/16/21 0543 05/16/21 1301 05/16/21 2021  BP: (!) 94/55 (!) 100/58 (!) 105/56 (!) 99/52  Pulse: 83 80 (!) 102 86  Resp: 18  17 18   Temp: 99 F (37.2 C) 98 F (36.7 C) 99.7 F (37.6 C) 98.2 F (36.8 C)  TempSrc: Oral Oral  Axillary  SpO2: 97% 99% 90% 94%    Intake/Output Summary (Last 24 hours) at 05/17/2021 1421 Last data filed at 05/17/2021 0400 Gross per 24 hour  Intake 2187.86 ml  Output 500  ml  Net 1687.86 ml   There were no vitals filed for this visit.  Examination: General exam: Awake, laying in bed, in nad Respiratory system: Normal respiratory effort, no wheezing Cardiovascular system: regular rate, s1, s2 Gastrointestinal system: Soft, nondistended, positive BS Central nervous system: CN2-12 grossly intact, strength intact Extremities: Perfused, no clubbing Skin: Normal skin turgor, no notable skin lesions seen Psychiatry: Mood normal // no visual hallucinations   Data Reviewed: I have personally reviewed following labs and  imaging studies  CBC: Recent Labs  Lab 05/12/21 1415 05/12/21 1427 05/14/21 0407 05/15/21 0305  WBC 5.1  --  4.9 3.2*  NEUTROABS 3.4  --   --   --   HGB 14.9 16.0  16.3 14.0 12.7*  HCT 49.3 47.0  48.0 44.9 40.9  MCV 100.4*  --  98.7 97.6  PLT 145*  --  124* 630*   Basic Metabolic Panel: Recent Labs  Lab 05/12/21 1415 05/12/21 1427 05/13/21 0227 05/14/21 0407 05/15/21 0305  NA 139 139  139 139 138 140  K 5.4* 4.3  4.3 4.2 3.2* 3.9  CL 99 99 100 99 103  CO2 34*  --  30 33* 31  GLUCOSE 92 95 82 104* 97  BUN 12 13 10 6 6   CREATININE 0.96 0.80 0.89 0.72 0.71  CALCIUM 9.1  --  9.1 8.8* 8.8*   GFR: CrCl cannot be calculated (Unknown ideal weight.). Liver Function Tests: Recent Labs  Lab 05/12/21 1415 05/14/21 0407 05/15/21 0305  AST 48* 32 30  ALT 36 32 31  ALKPHOS 63 56 47  BILITOT 1.2 0.8 0.7  PROT 6.8 6.2* 5.5*  ALBUMIN 3.5 3.0* 2.6*   No results for input(s): LIPASE, AMYLASE in the last 168 hours. No results for input(s): AMMONIA in the last 168 hours. Coagulation Profile: Recent Labs  Lab 05/12/21 1415  INR 1.1   Cardiac Enzymes: Recent Labs  Lab 05/12/21 1817  CKTOTAL 127   BNP (last 3 results) No results for input(s): PROBNP in the last 8760 hours. HbA1C: No results for input(s): HGBA1C in the last 72 hours. CBG: No results for input(s): GLUCAP in the last 168 hours. Lipid Profile: No results for input(s): CHOL, HDL, LDLCALC, TRIG, CHOLHDL, LDLDIRECT in the last 72 hours. Thyroid Function Tests: No results for input(s): TSH, T4TOTAL, FREET4, T3FREE, THYROIDAB in the last 72 hours. Anemia Panel: No results for input(s): VITAMINB12, FOLATE, FERRITIN, TIBC, IRON, RETICCTPCT in the last 72 hours. Sepsis Labs: Recent Labs  Lab 05/12/21 1817 05/12/21 2156  LATICACIDVEN 1.5 3.5*    Recent Results (from the past 240 hour(s))  Culture, blood (routine x 2)     Status: None   Collection Time: 05/12/21 11:45 AM   Specimen: BLOOD  Result  Value Ref Range Status   Specimen Description BLOOD LEFT ANTECUBITAL  Final   Special Requests   Final    BOTTLES DRAWN AEROBIC AND ANAEROBIC Blood Culture results may not be optimal due to an inadequate volume of blood received in culture bottles   Culture   Final    NO GROWTH 5 DAYS Performed at [facility], [place] [address], [place], [place] [address]    Report Status 05/17/2021 FINAL  Final  Resp Panel by RT-PCR (Flu A&B, Covid) Nasopharyngeal Swab     Status: None   Collection Time: 05/12/21 11:59 AM   Specimen: Nasopharyngeal Swab; Nasopharyngeal(NP) swabs in vial transport medium  Result Value Ref Range Status   SARS Coronavirus 2  by RT PCR NEGATIVE NEGATIVE Final    Comment: (NOTE) SARS-CoV-2 target nucleic acids are NOT DETECTED.  The SARS-CoV-2 RNA is generally detectable in upper respiratory specimens during the acute phase of infection. The lowest concentration of SARS-CoV-2 viral copies this assay can detect is 138 copies/mL. A negative result does not preclude SARS-Cov-2 infection and should not be used as the sole basis for treatment or other patient management decisions. A negative result may occur with  improper specimen collection/handling, submission of specimen other than nasopharyngeal swab, presence of viral mutation(s) within the areas targeted by this assay, and inadequate number of viral copies(<138 copies/mL). A negative result must be combined with clinical observations, patient history, and epidemiological information. The expected result is Negative.  Fact Sheet for Patients:  EntrepreneurPulse.com.au  Fact Sheet for Healthcare Providers:  IncredibleEmployment.be  This test is no t yet approved or cleared by the [place] FDA and  has been authorized for detection and/or diagnosis of SARS-CoV-2 by FDA under an Emergency Use Authorization (EUA). This EUA will remain  in effect (meaning this test can be  used) for the duration of the COVID-19 declaration under Section 564(b)(1) of the Act, 21 U.S.C.section 360bbb-3(b)(1), unless the authorization is terminated  or revoked sooner.       Influenza A by PCR NEGATIVE NEGATIVE Final   Influenza B by PCR NEGATIVE NEGATIVE Final    Comment: (NOTE) The Xpert Xpress SARS-CoV-2/FLU/RSV plus assay is intended as an aid in the diagnosis of influenza from Nasopharyngeal swab specimens and should not be used as a sole basis for treatment. Nasal washings and aspirates are unacceptable for Xpert Xpress SARS-CoV-2/FLU/RSV testing.  Fact Sheet for Patients: EntrepreneurPulse.com.au  Fact Sheet for Healthcare Providers: IncredibleEmployment.be  This test is not yet approved or cleared by the [place] FDA and has been authorized for detection and/or diagnosis of SARS-CoV-2 by FDA under an Emergency Use Authorization (EUA). This EUA will remain in effect (meaning this test can be used) for the duration of the COVID-19 declaration under Section 564(b)(1) of the Act, 21 U.S.C. section 360bbb-3(b)(1), unless the authorization is terminated or revoked.  Performed at [facility], [place] [address] [place], [place] [address]   Culture, blood (routine x 2)     Status: None   Collection Time: 05/12/21 10:00 PM   Specimen: BLOOD  Result Value Ref Range Status   Specimen Description BLOOD RIGHT HAND  Final   Special Requests   Final    BOTTLES DRAWN AEROBIC ONLY Blood Culture results may not be optimal due to an inadequate volume of blood received in culture bottles   Culture   Final    NO GROWTH 5 DAYS Performed at [facility], [place] [address] [place], [place] [address]    Report Status 05/17/2021 FINAL  Final     Radiology Studies: DG ESOPHAGUS W SINGLE CM (SOL OR THIN BA)  Result Date: 05/17/2021 CLINICAL DATA:  Difficulty swallowing.  Question of stricture. EXAM: ESOPHOGRAM/BARIUM SWALLOW  TECHNIQUE: Single contrast examination was performed using  thin barium. FLUOROSCOPY TIME:  Fluoroscopy Time:  2 minutes off seconds Radiation Exposure Index (if provided by the fluoroscopic device): 24.5 Number of Acquired Spot Images: 0 COMPARISON:  Swallow function from May 13, 2021. FINDINGS: Only small volumes could be swallowed due to aspiration risk. There was laryngeal penetration and small volume silent aspiration noted during the examination with thin barium. Cough with clearing. Esophagus patulous and with signs of dysmotility. Contrast passes  into the stomach from the esophagus. Limited primary wave was noted but patient not position in standard fashion due to debilitated state assessment of motility. No strong tertiary peristaltic activity was demonstrated. No gross evidence of w gastroesophageal reflux was visualized on the evaluation. Stasis in the esophagus was noted during the study there was no gross sign of stricture. Tablet not administered due to altered mental status and risk for aspiration. There was reflux of contrast from the distal into the more proximal esophagus during observation at the into the study with considerable stasis. IMPRESSION: Signs of dysmotility without gross signs of stricture on limited assessment. Laryngeal penetration with small volume silent aspiration. Due to this finding further evaluation with thick barium or barium tablet was not performed. Electronically Signed   By: Zetta Bills M.D.   On: 05/17/2021 09:40    Scheduled Meds: . acetaminophen  1,000 mg Oral QHS  . amphetamine-dextroamphetamine  40 mg Oral Daily  . enoxaparin (LOVENOX) injection  40 mg Subcutaneous Q24H  . FLUoxetine  40 mg Oral Daily  . levothyroxine  150 mcg Oral Once per day on Mon Tue Wed Thu Fri Sat  . melatonin  10 mg Oral QHS  . pantoprazole  40 mg Oral Daily  . polyethylene glycol  17 g Oral Daily  . traZODone  200 mg Oral QHS   Continuous Infusions: . sodium chloride 75  mL/hr at 05/17/21 1133  . ampicillin-sulbactam (UNASYN) IV 3 g (05/17/21 1418)     LOS: 5 days   Marylu Lund, MD [facility] Pager On Amion  If 7PM-7AM, please contact night-coverage 05/17/2021, 2:21 PM

## 2021-05-17 NOTE — Progress Notes (Signed)
[facility] Progress Note  Chief Complaint: Esophageal dysphagia and recurrent aspiration  History: Signout received from Dr. Tarri Glenn, chart review performed on this complex patient. I spoke with this patient and his wife, who was present for the entire visit.  He is having dinner, consisting of chopped chicken, mashed potatoes and broccoli.  He eats slowly and mostly independently.  His wife encourages him to drink liquids and periodically cough. [name] says he has been dealing with this condition for decades, and his wife notes that the swallowing difficulty and aspiration have worsened in the last several years.  ROS:  Objective:   Current Facility-Administered Medications:  .  0.9 %  sodium chloride infusion, , Intravenous, Continuous, Donne Hazel, MD, Last Rate: 75 mL/hr at 05/17/21 1133, New Bag at 05/17/21 1133 .  acetaminophen (TYLENOL) tablet 1,000 mg, 1,000 mg, Oral, QHS, Wynetta Fines T, MD, 1,000 mg at 05/16/21 2028 .  acetaminophen (TYLENOL) tablet 650 mg, 650 mg, Oral, Q6H PRN, Donne Hazel, MD .  albuterol (PROVENTIL) (2.5 MG/3ML) 0.083% nebulizer solution 2.5 mg, 2.5 mg, Nebulization, Q4H PRN, Wynetta Fines T, MD .  amphetamine-dextroamphetamine (ADDERALL XR) 24 hr capsule 40 mg, 40 mg, Oral, Daily, Wynetta Fines T, MD, 40 mg at 05/17/21 0944 .  Ampicillin-Sulbactam (UNASYN) 3 g in sodium chloride 0.9 % 100 mL IVPB, 3 g, Intravenous, Q8H, [place], RPH, Last Rate: 200 mL/hr at 05/17/21 1418, 3 g at 05/17/21 1418 .  enoxaparin (LOVENOX) injection 40 mg, 40 mg, Subcutaneous, Q24H, Wynetta Fines T, MD, 40 mg at 05/17/21 1655 .  FLUoxetine (PROZAC) capsule 40 mg, 40 mg, Oral, Daily, Wynetta Fines T, MD, 40 mg at 05/17/21 0944 .  glycopyrrolate (ROBINUL) tablet 1-2 mg, 1-2 mg, Oral, BID PRN, Wynetta Fines T, MD .  levothyroxine (SYNTHROID) tablet 150 mcg, 150 mcg, Oral, Once per day on Mon Tue Wed Thu Fri Sat, [place], Ping T, MD, 150 mcg at 05/17/21 0511 .  melatonin tablet 10 mg,  10 mg, Oral, QHS, Wynetta Fines T, MD, 10 mg at 05/16/21 2028 .  ondansetron (ZOFRAN) tablet 8 mg, 8 mg, Oral, Q8H PRN, Wynetta Fines T, MD .  pantoprazole (PROTONIX) EC tablet 40 mg, 40 mg, Oral, Daily, Wynetta Fines T, MD, 40 mg at 05/17/21 0947 .  polyethylene glycol (MIRALAX / GLYCOLAX) packet 17 g, 17 g, Oral, Daily, Donne Hazel, MD, 17 g at 05/17/21 0946 .  traZODone (DESYREL) tablet 200 mg, 200 mg, Oral, QHS, Wynetta Fines T, MD, 200 mg at 05/16/21 2029  . sodium chloride 75 mL/hr at 05/17/21 1133  . ampicillin-sulbactam (UNASYN) IV 3 g (05/17/21 1418)     Vital signs in last 24 hrs: Vitals:   05/16/21 2021 05/17/21 1537  BP: (!) 99/52 113/67  Pulse: 86 92  Resp: 18 17  Temp: 98.2 F (36.8 C) 97.9 F (36.6 C)  SpO2: 94% 100%    Intake/Output Summary (Last 24 hours) at 05/17/2021 1759 Last data filed at 05/17/2021 1500 Gross per 24 hour  Intake 1597.36 ml  Output 500 ml  Net 1097.36 ml     Physical Exam Sitting in bedside chair having dinner.  Dysarthric, slow speech pattern.  Muscle wasting in his hands and face  Recent Labs:  CBC Latest Ref Rng & Units 05/15/2021 05/14/2021 05/12/2021  WBC 4.0 - 10.5 K/uL 3.2(L) 4.9 -  Hemoglobin 13.0 - 17.0 g/dL 12.7(L) 14.0 16.0  Hematocrit 39.0 - 52.0 % 40.9 44.9 47.0  Platelets 150 - 400  K/uL 116(L) 124(L) -    Recent Labs  Lab 05/12/21 1415  INR 1.1   CMP Latest Ref Rng & Units 05/15/2021 05/14/2021 05/13/2021  Glucose 70 - 99 mg/dL 97 104(H) 82  BUN 6 - 20 mg/dL 6 6 10   Creatinine 0.61 - 1.24 mg/dL 0.71 0.72 0.89  Sodium 135 - 145 mmol/L 140 138 139  Potassium 3.5 - 5.1 mmol/L 3.9 3.2(L) 4.2  Chloride 98 - 111 mmol/L 103 99 100  CO2 22 - 32 mmol/L 31 33(H) 30  Calcium 8.9 - 10.3 mg/dL 8.8(L) 8.8(L) 9.1  Total Protein 6.5 - 8.1 g/dL 5.5(L) 6.2(L) -  Total Bilirubin 0.3 - 1.2 mg/dL 0.7 0.8 -  Alkaline Phos 38 - 126 U/L 47 56 -  AST 15 - 41 U/L 30 32 -  ALT 0 - 44 U/L 31 32 -     Radiologic studies: CLINICAL DATA:   Difficulty swallowing.  Question of stricture.   EXAM: ESOPHOGRAM/BARIUM SWALLOW   TECHNIQUE: Single contrast examination was performed using  thin barium.   FLUOROSCOPY TIME:  Fluoroscopy Time:  2 minutes off seconds   Radiation Exposure Index (if provided by the fluoroscopic device): 24.5   Number of Acquired Spot Images: 0   COMPARISON:  Swallow function from May 13, 2021.   FINDINGS: Only small volumes could be swallowed due to aspiration risk. There was laryngeal penetration and small volume silent aspiration noted during the examination with thin barium. Cough with clearing.   Esophagus patulous and with signs of dysmotility. Contrast passes into the stomach from the esophagus. Limited primary wave was noted but patient not position in standard fashion due to debilitated state assessment of motility. No strong tertiary peristaltic activity was demonstrated. No gross evidence of w gastroesophageal reflux was visualized on the evaluation. Stasis in the esophagus was noted during the study there was no gross sign of stricture. Tablet not administered due to altered mental status and risk for aspiration.   There was reflux of contrast from the distal into the more proximal esophagus during observation at the into the study with considerable stasis.   IMPRESSION: Signs of dysmotility without gross signs of stricture on limited assessment.   Laryngeal penetration with small volume silent aspiration. Due to this finding further evaluation with thick barium or barium tablet was not performed.     Electronically Signed   By: Zetta Bills M.D.   On: 05/17/2021 09:40  (Images personally reviewed -it is a somewhat limited study due to the patient's condition)  Assessment & Plan  Assessment:  Esophageal dysphagia from longstanding dysmotility that appears to be progressive. Recurrent aspiration as a result.  There does not appear to be a focal stricture, and this  does not look like achalasia.  I had a long discussion with [name] and his wife, and I do not think that there is a specific endoscopic intervention that would help him.  I believe an upper endoscopy would be greater risk than benefit. He is quite clear that he does not wish to have a feeding tube, something he has reportedly expressed in the past.  There are many things he is no longer able to do because of his condition, but he would like to eat to the extent that he can because it brings him pleasure.  Frankly, even if he had a feeding tube, it would not completely eliminate the aspiration risk of swallowed secretions or anything that may reflux from the stomach.  And it would only  be maximally beneficial if he were to completely be n.p.o. with a feeding tube, something which he is unwilling to do because it would decrease his overall quality of life.  I completely understand his feelings on that, his wife is in agreement, and they understand what that means for him.  We appreciate the opportunity to be involved in his care, our service will sign off at this point and you may call us as needed.   25 minutes were spent on this encounter (including chart review, history/exam, counseling/coordination of care, and documentation) > 50% of that time was spent on counseling and coordination of care.  Topics discussed included: See above   Nelida Meuse III Office: [phone]

## 2021-05-17 NOTE — Progress Notes (Signed)
Physical Therapy Treatment Patient Details Name: [name] MRN: [id number] DOB: 03-Mar-1961 Today's Date: 05/17/2021    History of Present Illness 60 y.o. male presented with new onset of shortness of breath and hypoxia. +aspiration pneumonia requiring BiPAP  PMH- myotonic dystrophy, OSA, noncompliant with CPAP, hypothyroidism, chronic dysphagia, anxiety/depression    PT Comments    Patient more alert and more talkative this date. Still very difficult to understand at times. Patient required verbal and tactile cues for technique for bed mobility, transfers and gait. He continues with initial posterior lean upon standing which eventually he corrects prior to initiating ambulation.    Follow Up Recommendations  CIR     Equipment Recommendations  Other (comment) (wife requests hoyer lift for getting pt off the floor when he falls)    Recommendations for Other Services Rehab consult;OT consult     Precautions / Restrictions Precautions Precautions: Fall Precaution Comments: has fallen at home; son can usually get him up but wife cannot    Mobility  Bed Mobility Overal bed mobility: Needs Assistance Bed Mobility: Rolling;Sidelying to Sit Rolling: Mod assist Sidelying to sit: Mod assist            Transfers Overall transfer level: Needs assistance Equipment used: 2 person hand held assist Transfers: Sit to/from Stand Sit to Stand: Min assist;+2 physical assistance Stand pivot transfers: Mod assist;+2 physical assistance       General transfer comment: Pt stood 2x from bed (he sat due to fatigue); stood 3x from chair with less posterior lean  Ambulation/Gait Ambulation/Gait assistance: Mod assist;+2 physical assistance Gait Distance (Feet): 3 Feet Assistive device: 2 person hand held assist Gait Pattern/deviations: Decreased stride length;Shuffle;Trunk flexed;Wide base of support;Step-to pattern     General Gait Details: pt followed command to bring his feet  closer together prior to initiating gait   Stairs             Wheelchair Mobility    Modified Rankin (Stroke Patients Only)       Balance Overall balance assessment: Needs assistance Sitting-balance support: No upper extremity supported;Feet supported Sitting balance-Leahy Scale: Fair     Standing balance support: Bilateral upper extremity supported;During functional activity Standing balance-Leahy Scale: Poor Standing balance comment: requires UE support bilaterally and external support for safety with static stance                            Cognition Arousal/Alertness: Awake/alert Behavior During Therapy: Flat affect Overall Cognitive Status: Difficult to assess                                 General Comments: Patient A&Ox3. Unable to recall reason for admission. Noted STM deficits      Exercises General Exercises - Lower Extremity Ankle Circles/Pumps: AROM;Both;5 reps;Supine    General Comments General comments (skin integrity, edema, etc.): wife present      Pertinent Vitals/Pain Pain Assessment: Faces Faces Pain Scale: No hurt    Home Living                      Prior Function            PT Goals (current goals can now be found in the care plan section) Acute Rehab PT Goals Patient Stated Goal: Per chart review wife wants pt strong enough to transfer and walk 2 steps into bathroom with  her assist PT Goal Formulation: With patient/family Time For Goal Achievement: 05/27/21 Potential to Achieve Goals: Fair Progress towards PT goals: Progressing toward goals    Frequency    Min 3X/week      PT Plan Current plan remains appropriate    Co-evaluation              AM-PAC PT "6 Clicks" Mobility   Outcome Measure  Help needed turning from your back to your side while in a flat bed without using bedrails?: A Lot Help needed moving from lying on your back to sitting on the side of a flat bed without  using bedrails?: A Lot Help needed moving to and from a bed to a chair (including a wheelchair)?: Total Help needed standing up from a chair using your arms (e.g., wheelchair or bedside chair)?: Total Help needed to walk in hospital room?: Total Help needed climbing 3-5 steps with a railing? : Total 6 Click Score: 8    End of Session Equipment Utilized During Treatment: Gait belt;Oxygen Activity Tolerance: Patient limited by fatigue Patient left: in chair;with call bell/phone within reach;with family/visitor present;with chair alarm set Nurse Communication: Mobility status PT Visit Diagnosis: Other abnormalities of gait and mobility (R26.89);Muscle weakness (generalized) (M62.81);Other symptoms and signs involving the nervous system (R29.898)     Time: 1638-4665 PT Time Calculation (min) (ACUTE ONLY): 22 min  Charges:  $Therapeutic Activity: 8-22 mins                      Arby Barrette, PT Pager [phone]    Rexanne Mano 05/17/2021, 4:56 PM

## 2021-05-17 NOTE — Progress Notes (Signed)
Inpatient Rehabilitation Admissions Coordinator  I await insurance approval for a possible CIR admit.  Danne Baxter, RN, MSN Rehab Admissions Coordinator [phone] 05/17/2021 3:01 PM

## 2021-05-17 NOTE — Plan of Care (Signed)
  Problem: Nutrition: Goal: Adequate nutrition will be maintained Outcome: Progressing   Problem: Coping: Goal: Level of anxiety will decrease Outcome: Progressing   

## 2021-05-17 NOTE — Plan of Care (Signed)

## 2021-05-17 NOTE — H&P (Incomplete)
Physical Medicine and Rehabilitation Admission H&P    Chief Complaint  Patient presents with  . Weakness  . 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  Per chart review patient lives with spouse.  1 level home with ramped entrance.  He transfers with assistance and and needed some assistance with bathing dressing and toileting.  Presented 05/12/2021 with lethargy as well as altered mental status.  His oxygen saturations had dipped into the 60s.  Chest x-ray low lung volumes bibasilar segmental atelectasis versus scarring.  Admission chemistries potassium 5.4, CO2 34, troponin 119, hemoglobin 14.9, platelets 145,000-124,000, blood cultures no growth to date, lactic acid 1.5-3.5, TSH 0.098, CK1 27, urinalysis negative nitrite, blood gas pH 7.208, PCO2 87.  Placed on broad-spectrum antibiotics.  Gastroenterology Dr. Tarri Glenn consulted in regards to dysphagia with swallow study completed presently on mechanical soft thin liquid diet however there did appear to be a focal stricture and currently did not feel endoscopic intervention was needed and patient is currently refusing any type of feeding tube...  Lovenox added for DVT prophylaxis.  Patient maintained on BiPAP however he has refused multiple times.  Therapy evaluations completed due to patient decreased functional mobility related to myotonic dystrophy was admitted for a comprehensive rehab program.  Review of Systems  Constitutional: Positive for malaise/fatigue.  Negative for chills and fever.  HENT: Negative for hearing loss.   Eyes: Negative for blurred vision and double vision.  Respiratory: Positive for shortness of breath.   Cardiovascular: Positive for leg swelling. Negative for chest pain and palpitations.  Gastrointestinal: Positive for constipation. Negative for heartburn, nausea and vomiting.  Genitourinary: Negative for dysuria, flank pain and hematuria.  Musculoskeletal: Positive for joint pain and myalgias.  Skin: Negative for rash.  Neurological: Positive for dizziness, tremors and weakness.  Psychiatric/Behavioral: Positive for depression. The patient has insomnia.   All other systems reviewed and are negative.  Past Medical History:  Diagnosis Date  . Adult ADHD   . Aspiration pneumonitis ([place]) 04/2017, 08/2017   Recurrent  . Chronic respiratory failure ([place])    As of 04/2018, Dr. Chase Caller suspects hypercapnic RF--but [name] normal.  May eventually need nocturnal noninvasive ventilation.  Ultimately, if progresses enough pt will need trach.  . Constipation, chronic    Has caused irritative urinary/bladder symptoms in the past.  . Depression   . Dizziness 06/2011+   Noncontrast CT and noncontrast MRI both negative 06/2011 at [facility] ED visit.  . Elevated transaminase level    Liver biopsy revealed fatty liver  . Erectile dysfunction   . Facial fracture ([place]) 06/2011   Nondisplaced bilateral [place] I fracture: no surgery required. (ENT, Dr. Wilburn Cornelia)   . Fatty liver disease, nonalcoholic   . GERD (gastroesophageal reflux disease)   . History of thyroid cancer 2010   Medullary carcinoma of thyroid. Thyroidectomy 08/2009 (Dr. Aida Puffer at [facility]).  Needs periodic serum calcitonin monitoring.  Marland Kitchen Hyperlipidemia   . Hypothyroidism   . LVH (left ventricular hypertrophy)    by EKG criteria 06/2011.  Hx of heart murmur--Followed by cardiology at [facility]: EF's have been "stable" per  cardio office note 12/29/10.  His echo 12/2012 showed normal LV  size and wall thickness but EF 45-50% and global LV hypokinesis was found.  . Myotonic dystrophy ([place]) Dx'd 1996   Progressive muscle weakness and swallowing dysfunction.  Initial dx by Dr. Jannifer Franklin, neurologist, but subsequent annual specialist f/u has been with Dr. Ladon Applebaum [facility].  Progressive debilitation--PT referral 04/2017, pt quit PT 05/2017.  . Nephrolithiasis   . Obesity, Class I, BMI 30-34.9   . OSA (obstructive sleep apnea)    SEVERE 08/2019 sleep study). CPAP to be initiated as of 04/18/20 pulm (Dr. Annamaria Boots)  . Pulmonary nodule, right 05/2017   RUL, 7 mm sub solid nodule.  Resolution noted on f/u CT 08/2017.   Past Surgical History:  Procedure Laterality Date  . COLONOSCOPY  09/13/2007   normal (Dr. Deatra Ina).  . EXTRACORPOREAL SHOCK WAVE LITHOTRIPSY    . Home sleep study  08/31/2019   Severe OSA  . THYROIDECTOMY  08/2009   for medullary thyroid cancer (Dr. Conley Canal at [facility] follows him for this).  . TRANSTHORACIC ECHOCARDIOGRAM  12/2012; 09/2017   2014-Normal LV size and wall thickness, LV EF 45-50%, with global hypokinesis of LV.  2018- EF 50-55%, normal wall motion, grd I DD, valves normal.   Family History  Problem Relation Age of Onset  . Arthritis Mother   . Hypertension Mother   . Alcohol abuse Father   . Arthritis Father   . Hypertension Father   . Colon cancer Father   . Prostate cancer Father   . Heart disease Brother    Social History:  reports that he has never smoked. He has never used smokeless tobacco. He reports current alcohol use. He reports that he does not use drugs. Allergies: No Known Allergies Medications Prior to Admission  Medication Sig Dispense Refill  . acetaminophen (TYLENOL) 500 MG tablet Take 1,000 mg by mouth at bedtime.    Marland Kitchen amphetamine-dextroamphetamine (ADDERALL XR) 10 MG 24 hr capsule TAKE 1 CAPSULE BY MOUTH ONCE DAILY (TO BE TAKEN WITH 30 MG XR CAPS) (Patient taking differently: Take 10 mg by mouth daily.) 30 capsule 0  .  amphetamine-dextroamphetamine (ADDERALL XR) 30 MG 24 hr capsule TAKE 1 CAPSULE BY MOUTH ONCE DAILY (Patient taking differently: Take 30 mg by mouth daily.) 30 capsule 0  . clotrimazole-betamethasone (LOTRISONE) cream APPLY TO THE AFFECTED AREA(S) TWICE DAILY AS NEEDED (Patient taking differently: Apply 1 application topically 2 (two) times daily as needed (to affected areas).) 45 g 1  . FLUoxetine (PROZAC) 40 MG capsule TAKE 1 CAPSULE BY MOUTH ONCE DAILY (Patient taking differently: Take 40 mg by mouth daily.) 90 capsule 1  . glycopyrrolate (ROBINUL) 2 MG tablet TAKE 1/2 TO 1 TABLET BY MOUTH TWO TIMES DAILY AS NEEDED FOR EXCESSIVE ORAL SECRETIONS (Patient taking differently: Take 1-2 mg by mouth See admin instructions. Take 2 mg by mouth at bedtime and an additional 1-2 mg once a day as needed FOR EXCESSIVE ORAL SECRETIONS) 60 tablet 6  . levothyroxine (SYNTHROID) 150 MCG tablet TAKE 1 TABLET BY MOUTH ONCE DAILY EXCEPT FOR SUNDAY SKIP DOSE (Patient taking differently: Take 150 mcg by mouth See admin instructions. Take 150 mcg by mouth in the morning before breakfast on Mon/Tues/Wed/Thurs/Fri/Sat- skip Sundays) 28 tablet 2  . Melatonin 10 MG CAPS Take 10 mg by mouth at bedtime.    Marland Kitchen omeprazole (PRILOSEC) 40 MG capsule TAKE 1 CAPSULE BY MOUTH ONCE DAILY (Patient taking differently: Take 40 mg by mouth daily before breakfast.)  90 capsule 2  . ondansetron (ZOFRAN) 8 MG tablet Take 1 tablet (8 mg total) by mouth every 8 (eight) hours as needed for nausea or vomiting. 20 tablet 1  . Simethicone 125 MG TABS Take 125 mg by mouth every 6 (six) hours as needed for flatulence.    . traZODone (DESYREL) 100 MG tablet Take 2 tablets by mouth daily at bedtime. (Patient taking differently: Take 200 mg by mouth at bedtime.) 180 tablet 3  . acetaminophen (TYLENOL) 325 MG tablet Take 1-2 tablets (325-650 mg total) by mouth every 4 (four) hours as needed for mild pain. (Patient not taking: No sig reported)    . chlorhexidine  (PERIDEX) 0.12 % solution USE 15ML TO SWISH FOR 2 FULL MINUTES THEN SPIT OUT--USE TWICE DAILY (Patient not taking: No sig reported) 473 mL 0  . COVID-19 mRNA vaccine, Pfizer, 30 MCG/0.3ML injection INJECT AS DIRECTED (Patient not taking: Reported on 05/12/2021) .3 mL 0  . HYDROcodone-acetaminophen (NORCO/VICODIN) 5-325 MG tablet TAKE 1 TABLET BY MOUTH EVERY 4 TO 6 HOURS AS NEEDED FOR PAIN. (Patient not taking: No sig reported) 14 tablet 0  . ibuprofen (ADVIL) 600 MG tablet Take 1 tablet (600 mg total) by mouth every 6 (six) hours as needed. (Patient not taking: No sig reported) 30 tablet 0  . penicillin v potassium (VEETID) 500 MG tablet TAKE 1 TABLET BY MOUTH 4 TIMES DAILY UNTIL FINISHED (Patient not taking: No sig reported) 28 tablet 0    Drug Regimen Review Drug regimen was reviewed and remains appropriate with no significant issues identified  Home: Home Living Family/patient expects to be discharged to:: Private residence Living Arrangements: Spouse/significant other Available Help at Discharge: Family,Available 24 hours/day Type of Home: House Home Access: Ramped entrance Home Layout: One level Bathroom Accessibility: No Home Equipment: Walker - 2 wheels,Wheelchair - power,Hospital bed,Grab bars - toilet Additional Comments: stair lift   Functional History: Prior Function Level of Independence: Needs assistance Gait / Transfers Assistance Needed: transfers with assist (likes to pull up on wife's hand or walker); has to walk 2 steps into bathroom to get to toilet ADL's / Homemaking Assistance Needed: Patient reports assist with ADLs/IADLs at baseline including bathing/dressing and toileting. Patient reports ability to feed himself at baseline.  Functional Status:  Mobility: Bed Mobility Overal bed mobility: Needs Assistance Bed Mobility: Rolling,Sidelying to Sit Rolling: Mod assist Sidelying to sit: Mod assist General bed mobility comments: Pt in chair on  arrival Transfers Overall transfer level: Needs assistance Equipment used: Rolling walker (2 wheeled),2 person hand held assist Transfers: Sit to/from [employer] Sit to Stand: Min guard,Min assist,+2 safety/equipment Stand pivot transfers: Mod assist,+2 physical assistance General transfer comment: Pt sit to stand initially min assist with +2 for safety however progressed to min guard without assist at times. Pt varies in his assistance that he gives to therapy.  Pt would stand to RW and push it out and then flex his trunk and sit back on the chair.  He kept saying he couldnt move his feet.  Sit to stand about 5x.   Wife came in and it was decided to try a HHA on either side of pt as they did this PTA when he got sick recently. Pt was then able to take steps as belwo. Ambulation/Gait Ambulation/Gait assistance: Mod assist,Min assist,+2 physical assistance Gait Distance (Feet): 5 Feet (3 feet, 5 feet, 3 feet, 5 feet) Assistive device: Rolling walker (2 wheeled),2 person hand held assist Gait Pattern/deviations: Step-through pattern,Decreased stride length,Shuffle,Antalgic,Trunk  flexed,Wide base of support General Gait Details: Pt would not take steps when he had RW.  Once RW moved, pt was able to walk very short distances with close chair follow with +2 mod to min assist with HHA bilaterally. Pt with very flexed trunk which worsens quickly after pt taeks a few steps. Pt states he cannot maintain stanidng due to back pain.  Pt also widens BOS with gait as well.  Pt needs cues for safety with sitting to recliner as well.  Pt overall unsafe at times and needs constant assist when up. Gait velocity interpretation: <1.31 ft/sec, indicative of household ambulator    ADL: ADL Overall ADL's : Needs assistance/impaired Lower Body Dressing: Maximal assistance,Bed level Lower Body Dressing Details (indicate cue type and reason): Max A to don footwear in supine. Patient reports assist for  donning LB clothing and footwear at baseline. Toilet Transfer: Moderate assistance,+2 for physical Technical brewer Details (indicate cue type and reason): Simulated with transfer to recliner with Mod A +2, RW, and increased time/effort. Patient quick to fatigue sitting prematurely despite cues. General ADL Comments: Patient greatly limited by decreased cognition, decreased sitting/standing balance and need for +2 assist for LB ADLs and ADL transfers.  Cognition: Cognition Overall Cognitive Status: Difficult to assess Orientation Level: Oriented to person,Oriented to place,Disoriented to time,Disoriented to situation Cognition Arousal/Alertness: Awake/alert Behavior During Therapy: Flat affect Overall Cognitive Status: Difficult to assess General Comments: Patient A&Ox3. Unable to recall reason for admission. Noted STM deficits with some difficulty recalling level of assist required for ADLs PTA. Difficult to assess due to: Impaired communication  Physical Exam: Blood pressure (!) 99/52, pulse 86, temperature 98.2 F (36.8 C), temperature source Axillary, resp. rate 18, SpO2 94 %. Physical Exam Neurological:     Comments: Patient is alert.  Makes eye contact with examiner.  Provides name and age.  Follows simple commands.     No results found for this or any previous visit (from the past 48 hour(s)). No results found.     Medical Problem List and Plan: 1.  Debility secondary to myotonic dystrophy/aspiration pneumonia/multi medical  -patient may *** shower  -ELOS/Goals: *** 2.  Antithrombotics: -DVT/anticoagulation: Lovenox  -antiplatelet therapy: N/A 3. Pain Management: Tylenol as needed 4. Mood/ADHD: Prozac 40 mg daily, trazodone 200 mg nightly, melatonin 10 mg nightly, Adderall 40 mg daily  -antipsychotic agents: N/A 5. Neuropsych: This patient is capable of making decisions on his own behalf. 6. Skin/Wound Care: Routine skin checks 7.  Fluids/Electrolytes/Nutrition: Routine in and outs with follow-up chemistries 8.  Aspiration pneumonia.  Complete course of Unasyn 9.  Chronic dysphagia.  Currently on a mechanical soft thin liquid diet.  Follow-up gastroenterology services and no current plans for endoscopic intervention 10.  Medullary thyroid carcinoma 08/2009.  Continue hormone supplement 11.  OSA.  Patient has been noncompliant with CPAP/BiPAP.  Provide education ***  Robert Parsons, PA-C 05/17/2021

## 2021-05-17 NOTE — Progress Notes (Signed)
Pt placed on bipap for the night. °

## 2021-05-18 DIAGNOSIS — J690 Pneumonitis due to inhalation of food and vomit: Principal | ICD-10-CM

## 2021-05-18 NOTE — Progress Notes (Signed)
Physical Therapy Treatment Patient Details Name: [name] MRN: [id number] DOB: 05/19/1961 Today's Date: 05/18/2021    History of Present Illness 60 y.o. male presented with new onset of shortness of breath and hypoxia. +aspiration pneumonia requiring BiPAP  PMH- myotonic dystrophy, OSA, noncompliant with CPAP, hypothyroidism, chronic dysphagia, anxiety/depression    PT Comments    Initiated session with bed level and sitting exercises and pt seemed to be able to advance his legs better in standing/gait. Was able to walk 4 feet forward and backward (backward more difficult as pt begins to lean posterior, bending forward at hips to try to counter, and shuffles feet). He is making progress and still is not back to being able to be cared for with +1 assist. Remains good candidate for rehab.     Follow Up Recommendations  CIR     Equipment Recommendations  Other (comment) (wife requests hoyer lift for getting pt off the floor when he falls)    Recommendations for Other Services Rehab consult;OT consult     Precautions / Restrictions Precautions Precautions: Fall Precaution Comments: has fallen at home; son can usually get him up but wife cannot Restrictions Weight Bearing Restrictions: No    Mobility  Bed Mobility Overal bed mobility: Needs Assistance Bed Mobility: Rolling;Sidelying to Sit Rolling: Min assist Sidelying to sit: Mod assist       General bed mobility comments: better rolling with cues and assist for lower body; is accustomed to reaching for wife's hand to pull up as he pushes with other elbow to come to sit    Transfers Overall transfer level: Needs assistance Equipment used: 2 person hand held assist Transfers: Sit to/from Stand Sit to Stand: Min assist;+2 physical assistance Stand pivot transfers: Mod assist;+2 physical assistance       General transfer comment: Pt stood 3x from bed (he sat due to fatigue); stood 3x from chair with less posterior  lean  Ambulation/Gait Ambulation/Gait assistance: Mod assist;+2 physical assistance Gait Distance (Feet): 8 Feet (4 ft forward and 4 ft backward) Assistive device: 2 person hand held assist Gait Pattern/deviations: Decreased stride length;Shuffle;Trunk flexed;Wide base of support;Step-to pattern     General Gait Details: pt followed command to bring his feet closer together prior to initiating gait; wife assisted for +2   Stairs             Wheelchair Mobility    Modified Rankin (Stroke Patients Only)       Balance Overall balance assessment: Needs assistance Sitting-balance support: No upper extremity supported;Feet supported Sitting balance-Leahy Scale: Fair     Standing balance support: Bilateral upper extremity supported;During functional activity Standing balance-Leahy Scale: Poor Standing balance comment: requires UE support bilaterally and external support for safety with static stance                            Cognition Arousal/Alertness: Awake/alert Behavior During Therapy: Flat affect Overall Cognitive Status: Difficult to assess                                        Exercises General Exercises - Lower Extremity Ankle Circles/Pumps: AROM;Both;Supine;10 reps Long Arc Quad: AROM;Both;5 reps;Seated Heel Slides: AROM;Both;10 reps Hip ABduction/ADduction: AROM;Both;10 reps Hip Flexion/Marching: AROM;5 reps;Seated    General Comments        Pertinent Vitals/Pain Pain Assessment: No/denies pain Faces Pain Scale:  No hurt    Home Living                      Prior Function            PT Goals (current goals can now be found in the care plan section) Acute Rehab PT Goals Patient Stated Goal: Per chart review wife wants pt strong enough to transfer and walk 2 steps into bathroom with her assist Time For Goal Achievement: 05/27/21 Potential to Achieve Goals: Fair Progress towards PT goals: Progressing toward  goals    Frequency    Min 3X/week      PT Plan Current plan remains appropriate    Co-evaluation              AM-PAC PT "6 Clicks" Mobility   Outcome Measure  Help needed turning from your back to your side while in a flat bed without using bedrails?: A Lot Help needed moving from lying on your back to sitting on the side of a flat bed without using bedrails?: A Lot Help needed moving to and from a bed to a chair (including a wheelchair)?: Total Help needed standing up from a chair using your arms (e.g., wheelchair or bedside chair)?: Total Help needed to walk in hospital room?: Total Help needed climbing 3-5 steps with a railing? : Total 6 Click Score: 8    End of Session Equipment Utilized During Treatment: Gait belt Activity Tolerance: Patient limited by fatigue Patient left: in chair;with call bell/phone within reach;with family/visitor present;with chair alarm set Nurse Communication: Mobility status PT Visit Diagnosis: Other abnormalities of gait and mobility (R26.89);Muscle weakness (generalized) (M62.81);Other symptoms and signs involving the nervous system (R29.898)     Time: 8675-4492 PT Time Calculation (min) (ACUTE ONLY): 40 min  Charges:  $Therapeutic Exercise: 23-37 mins $Therapeutic Activity: 8-22 mins                      Arby Barrette, PT Pager [phone]    Rexanne Mano 05/18/2021, 10:06 AM

## 2021-05-18 NOTE — Progress Notes (Signed)
Occupational Therapy Treatment Patient Details Name: [name] MRN: [id number] DOB: November 03, 1961 Today's Date: 05/18/2021    History of present illness 60 y.o. male presented with new onset of shortness of breath and hypoxia. +aspiration pneumonia requiring BiPAP  PMH- myotonic dystrophy, OSA, noncompliant with CPAP, hypothyroidism, chronic dysphagia, anxiety/depression   OT comments  Pt progressing to EOB and taking steps to [facility]. Pt limited by decreased cognition, decreased ability to recall exercises done 5 mins ago and decreased ability to care for self. Spouse very helpful and encouraging in room. Session focus on grooming with  minguardA at EOB x5 mins with fair dynamic balance; BUE strength/ROM exercises; functional mobility taking steps to [facility] with minA for stability. Pt would benefit from continued OT skilled services. OT following acutely. CIR denied pt- Pt still requires follow-up OT.   Follow Up Recommendations  CIR;Home health OT;Supervision/Assistance - 24 hour    Equipment Recommendations  None recommended by OT    Recommendations for Other Services      Precautions / Restrictions Precautions Precautions: Fall Precaution Comments: falls at home Restrictions Weight Bearing Restrictions: No       Mobility Bed Mobility Overal bed mobility: Needs Assistance Bed Mobility: Rolling;Sidelying to Sit;Sit to Sidelying Rolling: Min assist Sidelying to sit: Mod assist     Sit to sidelying: Mod assist General bed mobility comments: cues for hand placement    Transfers Overall transfer level: Needs assistance Equipment used: 2 person hand held assist Transfers: Sit to/from Stand Sit to Stand: Min assist;+2 physical assistance         General transfer comment: taking steps to [place] Overall balance assessment: Needs assistance Sitting-balance support: No upper extremity supported;Feet supported Sitting balance-Leahy Scale: Fair     Standing  balance support: Bilateral upper extremity supported;During functional activity Standing balance-Leahy Scale: Poor Standing balance comment: requires UE support bilaterally and external support for safety with static stance                           ADL either performed or assessed with clinical judgement   ADL Overall ADL's : Needs assistance/impaired     Grooming: Min guard;Sitting Grooming Details (indicate cue type and reason): set-upA to minguardA for ADL at EOB; pt donning dentures with assist and required assist to hold water basin below mouth                             Functional mobility during ADLs: Minimal assistance;+2 for physical assistance;+2 for safety/equipment;Rolling walker General ADL Comments: Pt limited by decreased cognition, decreased ability to recall exercises done 5 mins ago and decreased ability to care for self. Spouse very helpful and encouraging in room.     Vision   Vision Assessment?: No apparent visual deficits   Perception     Praxis      Cognition Arousal/Alertness: Awake/alert Behavior During Therapy: Flat affect Overall Cognitive Status: Difficult to assess                                 General Comments: Short term memory deficits with recall of exercises.        Exercises Exercises: General Upper Extremity;Other exercises General Exercises - Upper Extremity Shoulder Flexion: AAROM;Both;5 reps;Seated Elbow Flexion: AROM;Both;10 reps;Seated Elbow Extension: AROM;Both;10 reps;Seated Digit Composite Flexion: AROM;10 reps;Seated Composite Extension: AROM;Both;10  reps;Seated Other Exercises Other Exercises: rowing x10 reps with towel and with theraband   Shoulder Instructions       General Comments spouse present; O2 on RA    Pertinent Vitals/ Pain       Pain Assessment: No/denies pain Pain Intervention(s): Monitored during session  Home Living                                           Prior Functioning/Environment              Frequency  Min 2X/week        Progress Toward Goals  OT Goals(current goals can now be found in the care plan section)  Progress towards OT goals: Progressing toward goals  Acute Rehab OT Goals Patient Stated Goal: Per chart review wife wants pt strong enough to transfer and walk 2 steps into bathroom with her assist OT Goal Formulation: With patient/family Time For Goal Achievement: 05/28/21 Potential to Achieve Goals: Fair ADL Goals Pt Will Perform Eating: with set-up;sitting;with adaptive utensils Pt Will Perform Grooming: with set-up;sitting Pt Will Perform Upper Body Dressing: with min assist;sitting Pt Will Perform Lower Body Dressing: with mod assist;with adaptive equipment;sit to/from stand;sitting/lateral leans Pt Will Transfer to Toilet: with min assist;ambulating Pt Will Perform Toileting - Clothing Manipulation and hygiene: with mod assist;sit to/from stand Pt/caregiver will Perform Home Exercise Program: Increased ROM;Increased strength;Both right and left upper extremity;With written HEP provided;With Supervision  Plan Discharge plan remains appropriate    Co-evaluation                 AM-PAC OT "6 Clicks" Daily Activity     Outcome Measure   Help from another person eating meals?: A Little Help from another person taking care of personal grooming?: A Lot Help from another person toileting, which includes using toliet, bedpan, or urinal?: Total Help from another person bathing (including washing, rinsing, drying)?: A Lot Help from another person to put on and taking off regular upper body clothing?: A Lot Help from another person to put on and taking off regular lower body clothing?: Total 6 Click Score: 11    End of Session Equipment Utilized During Treatment: Gait belt  OT Visit Diagnosis: Unsteadiness on feet (R26.81);Muscle weakness (generalized) (M62.81);History of falling (Z91.81)    Activity Tolerance Patient tolerated treatment well;Patient limited by fatigue   Patient Left in bed;with call bell/phone within reach;with bed alarm set   Nurse Communication Mobility status        Time: 4315-4008 OT Time Calculation (min): 45 min  Charges: OT General Charges $OT Visit: 1 Visit OT Treatments $Self Care/Home Management : 8-22 mins $Neuromuscular Re-education: 8-22 mins $Therapeutic Exercise: 8-22 mins  Jefferey Pica, OTR/L Acute Rehabilitation Services Pager: [phone] Office: [phone]    IPJASNK C 05/18/2021, 5:22 PM

## 2021-05-18 NOTE — Progress Notes (Signed)
Placed patient on BIPAP for HS. Tolerating well at this time.

## 2021-05-18 NOTE — TOC Progression Note (Signed)
Transition of Care [facility]) - Progression Note    Patient Details  Name: XAIVER ROSKELLEY MRN: [id number] Date of Birth: Oct 18, 1961  Transition of Care [facility]) CM/SW Contact  Angelita [name], RN Phone Number: [phone]  05/18/2021, 2:35 PM  Clinical Narrative:    Cm has received message stating that patient has been denied for CIR and family is requesting [facility] services and Bipap. CM spoke with wife Ozell Juhasz who gives verbal consent to initiate [facility] services. CM has submitted request with [facility] and is awaiting response to know if agency accepts [employer] for [facility] services. CM has reached out to [place] for review of patients chart to determine if patient will be eligible for Bipap/Cpap at home. TOC will continue to follow.        Expected Discharge Plan and Services                                                 Social Determinants of Health (SDOH) Interventions    Readmission Risk Interventions No flowsheet data found.

## 2021-05-18 NOTE — Progress Notes (Signed)
PROGRESS NOTE    [name]  DPO:[id number] DOB: January 15, 1961 DOA: 05/12/2021 PCP: Tammi Sou, MD    Brief Narrative:  60 y.o. male with medical history significant of myotonic dystrophy, OSA, noncompliant with CPAP, hypothyroidism, chronic dysphagia, anxiety/depression, presented with new onset of shortness of breath and hypoxia  Assessment & Plan:   Active Problems:   Aspiration pneumonia (HCC)   Aspiration into airway   Oropharyngeal dysphagia  Acute, probably on chronic hypoxic and hypercapnic respiratory failure -concerns were noted with aspiration and noncompliant with CPAP machine for his OSA -Completed course of unasyn -SLP was consulted. Pt is now s/p MBS with findings notable for stasis of barium in cervical and thoracic esophagus with recs for esophogram vs GI consult -Continued on dysphagia 3 with thin liquids per SLP -GI was consulted per SLP recs. Pt is now s/p esophogram, reviewed. Findings suggestive of dysmotility without evidence of stricture. GI has since signed off -Initially was noncompliant with bipap, but is now tolerating bipap at night -Patient requires home NIV because patient has Chronic hypoxic/hypercapnic respiratory failure, CHF, Myotonic dystrophy. Pt requires frequent durations of respiratory support and deteriorates quickly in the absence of non-invasive mechanical ventilator. BIPAP has been considered but has been ruled-out and insufficient. Pt's PC02 was 72.8 on 05/12/2021. Interruption or failure to provide NIV would quickly lead to exacerbation of the patient's condition, lead to hospitalization and likely harm the patient or possibly death. Continued use of the NIV is preferred. Patient is able to maintain airway and clear secretions.     Resting tremors -TSH of 0.098. Free T4 is within normal limits at 1.63 -May need to adjust Adderall doses, follow-up with outpatient neurology. -CK trends have been unremarkable  Dehydration -Pt  had been continued on IVF hydration  Myotonic dystrophy -Several family members suffers from the conditions, noted.  Hypothyroidism -TSH of 0.098 -T4 noted to be within normal limits  Ambulation dysfunction -PT evaluated. Recommendations were noted for CIR -Insurance had denied CIR stay. Likely home with HH once home NIV can be arranged  DVT prophylaxis: Lovenox subq Code Status: Partial Family Communication: Pt in room, family is currently at bedside  Status is: Inpatient  Remains inpatient appropriate because:Inpatient level of care appropriate due to severity of illness   Dispo: The patient is from: Home              Anticipated d/c is to: Home              Patient currently is not medically stable to d/c.   Difficult to place patient No    Consultants:   GI  CIR  Procedures:     Antimicrobials: Anti-infectives (From admission, onward)   Start     Dose/Rate Route Frequency Ordered Stop   05/12/21 2200  Ampicillin-Sulbactam (UNASYN) 3 g in sodium chloride 0.9 % 100 mL IVPB  Status:  Discontinued        3 g 200 mL/hr over 30 Minutes Intravenous Every 8 hours 05/12/21 1737 05/18/21 1012   05/12/21 1500  piperacillin-tazobactam (ZOSYN) IVPB 3.375 g  Status:  Discontinued        3.375 g 12.5 mL/hr over 240 Minutes Intravenous Every 8 hours 05/12/21 1447 05/12/21 1725   05/12/21 1230  piperacillin-tazobactam (ZOSYN) IVPB 3.375 g  Status:  Discontinued        3.375 g 100 mL/hr over 30 Minutes Intravenous  Once 05/12/21 1219 05/12/21 1447      Subjective: No complaints this AM  Objective: Vitals:   05/18/21 0524 05/18/21 0748 05/18/21 0829 05/18/21 1247  BP: 107/66  108/60 117/63  Pulse: 71  92 85  Resp: 18  20 20   Temp: 98.3 F (36.8 C)  98.4 F (36.9 C) 98.9 F (37.2 C)  TempSrc: Axillary  Oral Oral  SpO2: 95% 95% 96% (!) 82%    Intake/Output Summary (Last 24 hours) at 05/18/2021 1734 Last data filed at 05/18/2021 0324 Gross per 24 hour  Intake  --  Output 1320 ml  Net -1320 ml   There were no vitals filed for this visit.  Examination: General exam: Conversant, in no acute distress Respiratory system: normal chest rise, clear, no audible wheezing Cardiovascular system: regular rhythm, s1-s2 Gastrointestinal system: Nondistended, nontender, pos BS Central nervous system: No seizures, no tremors Extremities: No cyanosis, no joint deformities Skin: No rashes, no pallor Psychiatry: Affect normal // no auditory hallucinations   Data Reviewed: I have personally reviewed following labs and imaging studies  CBC: Recent Labs  Lab 05/12/21 1415 05/12/21 1427 05/14/21 0407 05/15/21 0305  WBC 5.1  --  4.9 3.2*  NEUTROABS 3.4  --   --   --   HGB 14.9 16.0  16.3 14.0 12.7*  HCT 49.3 47.0  48.0 44.9 40.9  MCV 100.4*  --  98.7 97.6  PLT 145*  --  124* 053*   Basic Metabolic Panel: Recent Labs  Lab 05/12/21 1415 05/12/21 1427 05/13/21 0227 05/14/21 0407 05/15/21 0305  NA 139 139  139 139 138 140  K 5.4* 4.3  4.3 4.2 3.2* 3.9  CL 99 99 100 99 103  CO2 34*  --  30 33* 31  GLUCOSE 92 95 82 104* 97  BUN 12 13 10 6 6   CREATININE 0.96 0.80 0.89 0.72 0.71  CALCIUM 9.1  --  9.1 8.8* 8.8*   GFR: CrCl cannot be calculated (Unknown ideal weight.). Liver Function Tests: Recent Labs  Lab 05/12/21 1415 05/14/21 0407 05/15/21 0305  AST 48* 32 30  ALT 36 32 31  ALKPHOS 63 56 47  BILITOT 1.2 0.8 0.7  PROT 6.8 6.2* 5.5*  ALBUMIN 3.5 3.0* 2.6*   No results for input(s): LIPASE, AMYLASE in the last 168 hours. No results for input(s): AMMONIA in the last 168 hours. Coagulation Profile: Recent Labs  Lab 05/12/21 1415  INR 1.1   Cardiac Enzymes: Recent Labs  Lab 05/12/21 1817  CKTOTAL 127   BNP (last 3 results) No results for input(s): PROBNP in the last 8760 hours. HbA1C: No results for input(s): HGBA1C in the last 72 hours. CBG: No results for input(s): GLUCAP in the last 168 hours. Lipid Profile: No  results for input(s): CHOL, HDL, LDLCALC, TRIG, CHOLHDL, LDLDIRECT in the last 72 hours. Thyroid Function Tests: No results for input(s): TSH, T4TOTAL, FREET4, T3FREE, THYROIDAB in the last 72 hours. Anemia Panel: No results for input(s): VITAMINB12, FOLATE, FERRITIN, TIBC, IRON, RETICCTPCT in the last 72 hours. Sepsis Labs: Recent Labs  Lab 05/12/21 1817 05/12/21 2156  LATICACIDVEN 1.5 3.5*    Recent Results (from the past 240 hour(s))  Culture, blood (routine x 2)     Status: None   Collection Time: 05/12/21 11:45 AM   Specimen: BLOOD  Result Value Ref Range Status   Specimen Description BLOOD LEFT ANTECUBITAL  Final   Special Requests   Final    BOTTLES DRAWN AEROBIC AND ANAEROBIC Blood Culture results may not be optimal due to an inadequate volume of blood received in  culture bottles   Culture   Final    NO GROWTH 5 DAYS Performed at [facility], [place] [address], [place], [place] [address]    Report Status 05/17/2021 FINAL  Final  Resp Panel by RT-PCR (Flu A&B, Covid) Nasopharyngeal Swab     Status: None   Collection Time: 05/12/21 11:59 AM   Specimen: Nasopharyngeal Swab; Nasopharyngeal(NP) swabs in vial transport medium  Result Value Ref Range Status   SARS Coronavirus 2 by RT PCR NEGATIVE NEGATIVE Final    Comment: (NOTE) SARS-CoV-2 target nucleic acids are NOT DETECTED.  The SARS-CoV-2 RNA is generally detectable in upper respiratory specimens during the acute phase of infection. The lowest concentration of SARS-CoV-2 viral copies this assay can detect is 138 copies/mL. A negative result does not preclude SARS-Cov-2 infection and should not be used as the sole basis for treatment or other patient management decisions. A negative result may occur with  improper specimen collection/handling, submission of specimen other than nasopharyngeal swab, presence of viral mutation(s) within the areas targeted by this assay, and inadequate number of viral copies(<138  copies/mL). A negative result must be combined with clinical observations, patient history, and epidemiological information. The expected result is Negative.  Fact Sheet for Patients:  EntrepreneurPulse.com.au  Fact Sheet for Healthcare Providers:  IncredibleEmployment.be  This test is no t yet approved or cleared by the [place] FDA and  has been authorized for detection and/or diagnosis of SARS-CoV-2 by FDA under an Emergency Use Authorization (EUA). This EUA will remain  in effect (meaning this test can be used) for the duration of the COVID-19 declaration under Section 564(b)(1) of the Act, 21 U.S.C.section 360bbb-3(b)(1), unless the authorization is terminated  or revoked sooner.       Influenza A by PCR NEGATIVE NEGATIVE Final   Influenza B by PCR NEGATIVE NEGATIVE Final    Comment: (NOTE) The Xpert Xpress SARS-CoV-2/FLU/RSV plus assay is intended as an aid in the diagnosis of influenza from Nasopharyngeal swab specimens and should not be used as a sole basis for treatment. Nasal washings and aspirates are unacceptable for Xpert Xpress SARS-CoV-2/FLU/RSV testing.  Fact Sheet for Patients: EntrepreneurPulse.com.au  Fact Sheet for Healthcare Providers: IncredibleEmployment.be  This test is not yet approved or cleared by the [place] FDA and has been authorized for detection and/or diagnosis of SARS-CoV-2 by FDA under an Emergency Use Authorization (EUA). This EUA will remain in effect (meaning this test can be used) for the duration of the COVID-19 declaration under Section 564(b)(1) of the Act, 21 U.S.C. section 360bbb-3(b)(1), unless the authorization is terminated or revoked.  Performed at [facility], [place] [address] [place], [place] [address]   Culture, blood (routine x 2)     Status: None   Collection Time: 05/12/21 10:00 PM   Specimen: BLOOD  Result Value Ref Range Status    Specimen Description BLOOD RIGHT HAND  Final   Special Requests   Final    BOTTLES DRAWN AEROBIC ONLY Blood Culture results may not be optimal due to an inadequate volume of blood received in culture bottles   Culture   Final    NO GROWTH 5 DAYS Performed at [facility], [place] [address], [place], [place] [address]    Report Status 05/17/2021 FINAL  Final     Radiology Studies: DG ESOPHAGUS W SINGLE CM (SOL OR THIN BA)  Result Date: 05/17/2021 CLINICAL DATA:  Difficulty swallowing.  Question of stricture. EXAM: ESOPHOGRAM/BARIUM SWALLOW TECHNIQUE:  Single contrast examination was performed using  thin barium. FLUOROSCOPY TIME:  Fluoroscopy Time:  2 minutes off seconds Radiation Exposure Index (if provided by the fluoroscopic device): 24.5 Number of Acquired Spot Images: 0 COMPARISON:  Swallow function from May 13, 2021. FINDINGS: Only small volumes could be swallowed due to aspiration risk. There was laryngeal penetration and small volume silent aspiration noted during the examination with thin barium. Cough with clearing. Esophagus patulous and with signs of dysmotility. Contrast passes into the stomach from the esophagus. Limited primary wave was noted but patient not position in standard fashion due to debilitated state assessment of motility. No strong tertiary peristaltic activity was demonstrated. No gross evidence of w gastroesophageal reflux was visualized on the evaluation. Stasis in the esophagus was noted during the study there was no gross sign of stricture. Tablet not administered due to altered mental status and risk for aspiration. There was reflux of contrast from the distal into the more proximal esophagus during observation at the into the study with considerable stasis. IMPRESSION: Signs of dysmotility without gross signs of stricture on limited assessment. Laryngeal penetration with small volume silent aspiration. Due to this finding further evaluation with thick barium or  barium tablet was not performed. Electronically Signed   By: Zetta Bills M.D.   On: 05/17/2021 09:40    Scheduled Meds: . acetaminophen  1,000 mg Oral QHS  . amphetamine-dextroamphetamine  40 mg Oral Daily  . enoxaparin (LOVENOX) injection  40 mg Subcutaneous Q24H  . FLUoxetine  40 mg Oral Daily  . levothyroxine  150 mcg Oral Once per day on Mon Tue Wed Thu Fri Sat  . melatonin  10 mg Oral QHS  . pantoprazole  40 mg Oral Daily  . polyethylene glycol  17 g Oral Daily  . traZODone  200 mg Oral QHS   Continuous Infusions: . sodium chloride 75 mL/hr at 05/18/21 1600     LOS: 6 days   Marylu Lund, MD [facility] Pager On Amion  If 7PM-7AM, please contact night-coverage 05/18/2021, 5:34 PM

## 2021-05-18 NOTE — Plan of Care (Signed)

## 2021-05-18 NOTE — Progress Notes (Addendum)
Inpatient Rehabilitation Admissions Coordinator  I met at bedside with patient's wife. We reviewed cost of care if insurance approves CIR. I await insurance determination.  Danne Baxter, RN, MSN Rehab Admissions Coordinator [phone] 05/18/2021 12:41 PM   I have receeved a denial for CIR admit from [facility]. I contacted his wife, [name], by phone and she is aware. She wishes to pursue home with [facility] and is asking how she will obtain BIPAP for home use, for he did not have this prior to admit. I have notified acute team, Dr. Wyline Copas and Festus Holts, RN CM with TOC. We will sign off at this time.  Danne Baxter, RN, MSN Rehab Admissions Coordinator [phone] 05/18/2021 2:15 PM

## 2021-05-18 NOTE — Consult Note (Signed)
   [facility] Inpatient Consult   05/18/2021  AKIM WATKINSON 1961/11/08 [id number]    [facility] [ACO] Patient: [place] plan  Patient is currently active with [facility] for chronic disease management services.  Patient has been engaged by a [place] Coordinator for the [place]. Our community based plan of care has focused on disease management and community resource support.  Currently, PT/Ot is recommending that patient is in need of inpatient rehabilitation for next transition of care level.  A rehab consult is made and pending insurance approval.  Plan: Will continue to follow for transition/disposition. Will update [facility] RN Care Coordinator of recommendations.   For additional questions or referrals please contact:   Natividad Brood, RN BSN [facility] Liaison  [phone] business mobile phone Toll free office [phone]  Fax number: [phone] [email or web address]

## 2021-05-19 DIAGNOSIS — T17908D Unspecified foreign body in respiratory tract, part unspecified causing other injury, subsequent encounter: Secondary | ICD-10-CM

## 2021-05-19 LAB — CBC
HCT: 40.1 % (ref 39.0–52.0)
Hemoglobin: 12.7 g/dL — ABNORMAL LOW (ref 13.0–17.0)
MCH: 30 pg (ref 26.0–34.0)
MCHC: 31.7 g/dL (ref 30.0–36.0)
MCV: 94.6 fL (ref 80.0–100.0)
Platelets: 174 10*3/uL (ref 150–400)
RBC: 4.24 MIL/uL (ref 4.22–5.81)
RDW: 14.7 % (ref 11.5–15.5)
WBC: 3.7 10*3/uL — ABNORMAL LOW (ref 4.0–10.5)
nRBC: 0 % (ref 0.0–0.2)

## 2021-05-19 LAB — COMPREHENSIVE METABOLIC PANEL
ALT: 21 U/L (ref 0–44)
AST: 19 U/L (ref 15–41)
Albumin: 2.4 g/dL — ABNORMAL LOW (ref 3.5–5.0)
Alkaline Phosphatase: 41 U/L (ref 38–126)
Anion gap: 6 (ref 5–15)
BUN: 6 mg/dL (ref 6–20)
CO2: 31 mmol/L (ref 22–32)
Calcium: 8.6 mg/dL — ABNORMAL LOW (ref 8.9–10.3)
Chloride: 103 mmol/L (ref 98–111)
Creatinine, Ser: 0.68 mg/dL (ref 0.61–1.24)
GFR, Estimated: >60 mL/min (ref >60)
Glucose, Bld: 99 mg/dL (ref 70–99)
Potassium: 3 mmol/L — ABNORMAL LOW (ref 3.5–5.1)
Sodium: 140 mmol/L (ref 135–145)
Total Bilirubin: 0.7 mg/dL (ref 0.3–1.2)
Total Protein: 5.4 g/dL — ABNORMAL LOW (ref 6.5–8.1)

## 2021-05-19 MED ORDER — POTASSIUM CHLORIDE 10 MEQ/100ML IV SOLN
10.0000 meq | INTRAVENOUS | Status: AC
  Administered 2021-05-19 (×4): 10 meq via INTRAVENOUS
  Filled 2021-05-19 (×4): qty 100

## 2021-05-19 NOTE — TOC Transition Note (Signed)
Transition of Care [facility]) - CM/SW Discharge Note   Patient Details  Name: [name] MRN: [id number] Date of Birth: [birth year]  Transition of Care [facility]) CM/SW Contact:  Angelita [name], RN Phone Number: [phone]  05/19/2021, 3:18 PM   Clinical Narrative:    Insurance auth received for [employer]. Patient does not need home O2 per qualification O2 screen. HH has been set up for PT/OT/SLP with [facility]. DME hoyer lift has been ordered through [employer] . No other needs noted at this time. TOC will sign off.    Final next level of care: Home w Home Health Services Barriers to Discharge: No Barriers Identified   Patient Goals and CMS Choice   CMS Medicare.gov Compare Post Acute Care list provided to:: Patient Represenative (must comment) Choice offered to / list presented to : Spouse  Discharge Placement                       Discharge Plan and Services                          HH Arranged: Speech Therapy,PT,OT [place] Agency: [facility] (now [facility] Ileene Hutchinson) Date [place]: 05/19/21 Time [place]: 1104 Representative spoke with at [place]: [place] ([place]) Interventions     Readmission Risk Interventions No flowsheet data found.

## 2021-05-19 NOTE — Progress Notes (Signed)
SLP Cancellation Note  Patient Details Name: [name] MRN: [id number] DOB: 11-21-1961   Cancelled treatment:       Reason Eval/Treat Not Completed: Other (comment) (Pt arrived at room, but it appears that pt has been discharged and staff nearby indicated that he has already left.)  Kelty Szafran I. Hardin Negus, [place], [place] Office number [phone] Pager [phone]  Horton Marshall 05/19/2021, 5:27 PM

## 2021-05-19 NOTE — Plan of Care (Signed)
  Problem: Clinical Measurements: Goal: Respiratory complications will improve Outcome: Progressing   Problem: Elimination: Goal: Will not experience complications related to bowel motility Outcome: Progressing   Problem: Elimination: Goal: Will not experience complications related to urinary retention Outcome: Progressing

## 2021-05-19 NOTE — TOC Progression Note (Signed)
Transition of Care [facility]) - Progression Note    Patient Details  Name: [name] MRN: [id number] Date of Birth: 1961/11/17  Transition of Care [facility]) CM/SW [place], RN Phone Number: [phone]  05/19/2021, 11:05 AM  Clinical Narrative:    HH has been set up with [place]. [place] continues to work on [employer] fast streamer for NIV. All documents have been signed. Per Brenton Grills with [place] can take a couple of days. CM spoke with Dr. Bonner Puna to determine if patient will need to wait for NIV prior to being discharged. Dr. Bonner Puna states that patient will need to wait for niv approval and delivery. TOC will continue to follow.         Expected Discharge Plan and Services           Expected Discharge Date: 05/19/21                         HH Arranged: PT,OT [place]: [facility] Ileene Hutchinson) Date [place]: 05/19/21 Time [place]: 1104 Representative spoke with at [place]: [place] ([place]) Interventions    Readmission Risk Interventions No flowsheet data found.

## 2021-05-19 NOTE — Discharge Summary (Signed)
Physician Discharge Summary  [name] KDT:[id number] DOB: 1961-05-03 DOA: 05/12/2021  PCP: Tammi Sou, MD  Admit date: 05/12/2021 Discharge date: 05/19/2021  Admitted From: Home Disposition: Home   Recommendations for Outpatient Follow-up:  1. Follow up with PCP in 1-2 weeks 2. Follow up with outpatient neurology after discharge 3. If aspiration events recur more frequently, would establish follow up with palliative care.  Home Health: PT, OT Equipment/Devices: Supplemental oxygen, BiPAP Discharge Condition: Stable CODE STATUS: Partial, DNI Diet recommendation: Dysphagia 3  Brief/Interim Summary: [name] is a 60 y.o. male with a history of myotonic dystrophy, OSA, hypothyroidism, dysphagia, anxiety/depression who presented to the ED 5/18 with shortness of breath found to be hypoxic, suspected to be due to recurrent aspiration pneumonia and untreated sleep apnea which improved with antibiotics, aspiration precautions, and supplemental oxygen as well as BiPAP use with any sleeping.   Discharge Diagnoses:  Active Problems:   Aspiration pneumonia (HCC)   Aspiration into airway   Oropharyngeal dysphagia  Acute on chronic hypoxic and hypercapnic respiratory failure due to right-sided aspiration pneumonia and OSA as well as progressive muscular weakness related to myotonic dystrophy.  - Continue BiPAP and supplemental oxygen at home. Insurance authorization has been obtained for this prior to discharge.   Dysphagia, esophageal dysmotility: Suspected progressive sequelae from myotonic dystrophy.  - Follow up with GI - Continue home robinul - Dysphagia 3 diet per SLP.  - Goals of care are clear per the patient: No feeding tube as it would be unlikely to resolve aspiration risk and pt would not want to be strictly NPO. Pt is DNI.  Resting tremors:  - Follow-up with outpatient neurology. ?if adderall dosing should be modified.   Dehydration: Resolved. Taking  adequate po at this time.  Myotonic dystrophy: Several family members suffer from this condition. Pt is clear in his goals of care going forward.  Hypothyroidism: TSH 0.098, T4 noted to be within normal limits at 1.63. - Continue synthroid.   Ambulation dysfunction -PT evaluated. Recommendations were noted for CIR which was declined by insurance. the patient will be discharged home with home health support.   Anxiety, depression: Quiescent. - Continue home medications.   Obesity: Estimated body mass index is 38.45 kg/m as calculated from the following:   Height as of 03/29/21: 5\' 4"  (1.626 m).   Weight as of 03/29/21: 101.6 kg.  Discharge Instructions Discharge Instructions    Discharge instructions   Complete by: As directed    - Continue a diet that reduces risk of aspiration and take every precaution to reduce this risk as well. You have completed antibiotics.  - Continue BiPAP every time you sleep. You may also need supplemental oxygen which will be arranged for you if you do.  - Follow up with your PCP and neurology after discharge, or seek medical attention right away if your symptoms worsen.     Allergies as of 05/19/2021   No Known Allergies     Medication List    STOP taking these medications   chlorhexidine 0.12 % solution Commonly known as: PERIDEX   HYDROcodone-acetaminophen 5-325 MG tablet Commonly known as: NORCO/VICODIN   ibuprofen 600 MG tablet Commonly known as: ADVIL   penicillin v potassium 500 MG tablet Commonly known as: VEETID   Pfizer-BioNTech COVID-19 Vacc 30 MCG/0.3ML injection Generic drug: COVID-19 mRNA vaccine (Pfizer)     TAKE these medications   acetaminophen 500 MG tablet Commonly known as: TYLENOL Take 1,000 mg by mouth at  bedtime. What changed: Another medication with the same name was removed. Continue taking this medication, and follow the directions you see here.   Adderall XR 10 MG 24 hr capsule Generic drug:  amphetamine-dextroamphetamine TAKE 1 CAPSULE BY MOUTH ONCE DAILY (TO BE TAKEN WITH 30 MG XR CAPS) What changed:   how much to take  how to take this  when to take this   Adderall XR 30 MG 24 hr capsule Generic drug: amphetamine-dextroamphetamine TAKE 1 CAPSULE BY MOUTH ONCE DAILY What changed: how much to take   clotrimazole-betamethasone cream Commonly known as: LOTRISONE APPLY TO THE AFFECTED AREA(S) TWICE DAILY AS NEEDED What changed:   how much to take  how to take this  when to take this  reasons to take this  additional instructions   FLUoxetine 40 MG capsule Commonly known as: PROZAC TAKE 1 CAPSULE BY MOUTH ONCE DAILY What changed: how much to take   glycopyrrolate 2 MG tablet Commonly known as: ROBINUL TAKE 1/2 TO 1 TABLET BY MOUTH TWO TIMES DAILY AS NEEDED FOR EXCESSIVE ORAL SECRETIONS What changed:   how much to take  how to take this  when to take this  additional instructions   levothyroxine 150 MCG tablet Commonly known as: SYNTHROID TAKE 1 TABLET BY MOUTH ONCE DAILY EXCEPT FOR SUNDAY SKIP DOSE What changed:   how much to take  how to take this  when to take this  additional instructions   Melatonin 10 MG Caps Take 10 mg by mouth at bedtime.   omeprazole 40 MG capsule Commonly known as: PRILOSEC TAKE 1 CAPSULE BY MOUTH ONCE DAILY What changed:   how much to take  when to take this   ondansetron 8 MG tablet Commonly known as: Zofran Take 1 tablet (8 mg total) by mouth every 8 (eight) hours as needed for nausea or vomiting.   Simethicone 125 MG Tabs Take 125 mg by mouth every 6 (six) hours as needed for flatulence.   traZODone 100 MG tablet Commonly known as: DESYREL Take 2 tablets by mouth daily at bedtime. What changed:   how much to take  how to take this  when to take this  additional instructions       No Known Allergies  Consultations:  GI  Procedures/Studies: DG Chest Port 1 View  Result Date:  05/13/2021 CLINICAL DATA:  Pneumonia. EXAM: PORTABLE CHEST 1 VIEW COMPARISON:  05/12/2021.  12/12/2018. FINDINGS: Mediastinum and hilar structures normal. Heart size normal. Low lung volumes with mild bibasilar atelectasis. No pleural effusion or pneumothorax. IMPRESSION: Low lung volumes with mild bibasilar atelectasis and or scarring again noted. No interim change. Electronically Signed   By: Marcello Moores  Register   On: 05/13/2021 06:31   DG Chest Port 1 View  Result Date: 05/12/2021 CLINICAL DATA:  Shortness of breath and weakness. EXAM: PORTABLE CHEST 1 VIEW COMPARISON:  12/12/2018 and CT chest 09/03/2017. FINDINGS: Patient is slightly rotated. Trachea is midline. Heart size is accentuated by AP technique and low lung volumes. Streaky densities in the lung bases. No airspace consolidation or pleural fluid. IMPRESSION: Low lung volumes with bibasilar subsegmental atelectasis or scarring. Electronically Signed   By: Lorin Picket M.D.   On: 05/12/2021 13:30   DG Swallowing Func-Speech Pathology  Result Date: 05/13/2021 Objective Swallowing Evaluation: Type of Study: MBS-Modified Barium Swallow Study  Patient Details Name: ANTONIUS HARTLAGE MRN: [id number] Date of Birth: 1961-10-05 Today's Date: 05/13/2021 Time: SLP Start Time (ACUTE ONLY): 2542 -SLP Stop Time (ACUTE  ONLY): 1403 SLP Time Calculation (min) (ACUTE ONLY): 25 min Past Medical History: Past Medical History: Diagnosis Date . Adult ADHD  . Aspiration pneumonitis ([place]) 04/2017, 08/2017  Recurrent . Chronic respiratory failure ([place])   As of 04/2018, Dr. Chase Caller suspects hypercapnic RF--but [name] normal.  May eventually need nocturnal noninvasive ventilation.  Ultimately, if progresses enough pt will need trach. . Constipation, chronic   Has caused irritative urinary/bladder symptoms in the past. . Depression  . Dizziness 06/2011+  Noncontrast CT and noncontrast MRI both negative 06/2011 at [facility] ED visit. . Elevated transaminase level   Liver biopsy revealed  fatty liver . Erectile dysfunction  . Facial fracture ([place]) 06/2011  Nondisplaced bilateral [place] I fracture: no surgery required. (ENT, Dr. Wilburn Cornelia)  . Fatty liver disease, nonalcoholic  . GERD (gastroesophageal reflux disease)  . History of thyroid cancer 2010  Medullary carcinoma of thyroid. Thyroidectomy 08/2009 (Dr. Aida Puffer at [facility]).  Needs periodic serum calcitonin monitoring. Marland Kitchen Hyperlipidemia  . Hypothyroidism  . LVH (left ventricular hypertrophy)   by EKG criteria 06/2011.  Hx of heart murmur--Followed by cardiology at [facility]: EF's have been "stable" per cardio office note 12/29/10.  His echo 12/2012 showed normal LV size and wall thickness but EF 45-50% and global LV hypokinesis was found. . Myotonic dystrophy ([place]) Dx'd 1996  Progressive muscle weakness and swallowing dysfunction.  Initial dx by Dr. Jannifer Franklin, neurologist, but subsequent annual specialist f/u has been with Dr. Ladon Applebaum [facility]  Progressive debilitation--PT referral 04/2017, pt quit PT 05/2017. . Nephrolithiasis  . Obesity, Class I, BMI 30-34.9  . OSA (obstructive sleep apnea)   SEVERE 08/2019 sleep study). CPAP to be initiated as of 04/18/20 pulm (Dr. Annamaria Boots) . Pulmonary nodule, right 05/2017  RUL, 7 mm sub solid nodule.  Resolution noted on f/u CT 08/2017. Past Surgical History: Past Surgical History: Procedure Laterality Date . COLONOSCOPY  09/13/2007  normal (Dr. Deatra Ina). . EXTRACORPOREAL SHOCK WAVE LITHOTRIPSY   . Home sleep study  08/31/2019  Severe OSA . THYROIDECTOMY  08/2009  for medullary thyroid cancer ([facility]. Conley Canal at [facility] him for this). . TRANSTHORACIC ECHOCARDIOGRAM  12/2012; 09/2017  2014-Normal LV size and wall thickness, LV EF 45-50%, with global hypokinesis of LV.  2018- EF 50-55%, normal wall motion, grd I DD, valves normal. HPI: Pt is a 60 y.o. male who presented with new onset of shortness of breath and hypoxia. Per H&P, has chronic dysphagia, secondary to myotonic dystrophy, he has been followed with speech  therapist and recommended G-tubes, but pt has declined several times. MBS 09/27/17: moderate pharyngeal dysphagia marked by decreased ROM of pharyngeal/laryngeal musculature and potential weakness, leading to poor base-of-tongue to pharyngeal wall approximation, trace aspiration with larger boluses.  There is reduced inversion of epiglottis over laryngeal vestibule. A regular texture diety with thin liquids was recommended at that time. CXR 5/19: Low lung volumes with mild bibasilar atelectasis and or scarring  again noted. PMH: myotonic dystrophy, OSA, noncompliant with CPAP, hypothyroidism, chronic dysphagia, anxiety/depression  No data recorded Assessment / Plan / Recommendation CHL IP CLINICAL IMPRESSIONS 05/13/2021 Clinical Impression Pt presents with oropharyngeal dysphagia characterized by weak bolus manipulation, prolonged mastication and reduction in lingual retraction, pharyngeal constriction, hyolaryngeal elevation, and anterior laryngeal movement. Epiglottic inversion was incomplete, but some airway protention was achieved by approximation of the arytenoids to the laryngeal surface of the epiglottis. Residue was noted in the valleculae, pyriform sinuses and on the posterior pharyngeal wall due to reduced pressure, but vallecular residue was most  significant. Residue was improved to a more functional level with use of a liquid wash. Penetration (PAS 3,5) was consistently noted with thin liquids via cup and straw and intermittently resulted in trace silent aspiration (PAS 8). Laryngeal invasion was secondary to incomplete epiglottic inversion. No functional benefit was noted with postural modifications, but prompted coughing was succesful in expelling penetrated material and therefore avoiding subsequent aspiration. Laryngeal invasion (PAS 3,5) was less frequently noted with nectar thick liquids, but this resulted in increased pharyngeal residue which could increase aspiration risk after deglutition and  prompted coughing was less effective in fully expelling penetrated material. A dysphagia 3 diet with thin liquids (via cup) is recommended at this time with strict observance of swallowing precautions and with full supervision to ensure observance of swallowing precautions. Esophageal screening again revealed stasis of barium in the cervical, and thoracic esophagus as was similarly noted in 2018. SLP recommends esophageal assessment (e.g., esophagram) and/or GI consult. SLP Visit Diagnosis Dysphagia, oropharyngeal phase (R13.12) Attention and concentration deficit following -- Frontal lobe and executive function deficit following -- Impact on safety and function Mild aspiration risk;Moderate aspiration risk   CHL IP TREATMENT RECOMMENDATION 05/13/2021 Treatment Recommendations Therapy as outlined in treatment plan below   Prognosis 05/13/2021 Prognosis for Safe Diet Advancement Fair Barriers to Reach Goals Time post onset;Motivation Barriers/Prognosis Comment -- CHL IP DIET RECOMMENDATION 05/13/2021 SLP Diet Recommendations Dysphagia 3 (Mech soft) solids;Thin liquid Liquid Administration via Cup;No straw Medication Administration Whole meds with puree Compensations Slow rate;Small sips/bites Postural Changes Seated upright at 90 degrees;Remain semi-upright after after feeds/meals (Comment)   CHL IP OTHER RECOMMENDATIONS 05/13/2021 Recommended Consults -- Oral Care Recommendations Oral care BID Other Recommendations --   CHL IP FOLLOW UP RECOMMENDATIONS 05/13/2021 Follow up Recommendations (No Data)   CHL IP FREQUENCY AND DURATION 05/13/2021 Speech Therapy Frequency (ACUTE ONLY) min 2x/week Treatment Duration 2 weeks      CHL IP ORAL PHASE 05/13/2021 Oral Phase Impaired Oral - Pudding Teaspoon -- Oral - Pudding Cup -- Oral - Honey Teaspoon -- Oral - Honey Cup -- Oral - Nectar Teaspoon -- Oral - Nectar Cup Weak lingual manipulation Oral - Nectar Straw Weak lingual manipulation Oral - Thin Teaspoon -- Oral - Thin Cup Weak  lingual manipulation Oral - Thin Straw Weak lingual manipulation Oral - Puree Weak lingual manipulation Oral - Mech Soft -- Oral - Regular Weak lingual manipulation;Impaired mastication Oral - Multi-Consistency -- Oral - Pill -- Oral Phase - Comment --  CHL IP PHARYNGEAL PHASE 05/13/2021 Pharyngeal Phase Impaired Pharyngeal- Pudding Teaspoon -- Pharyngeal -- Pharyngeal- Pudding Cup -- Pharyngeal -- Pharyngeal- Honey Teaspoon -- Pharyngeal -- Pharyngeal- Honey Cup -- Pharyngeal -- Pharyngeal- Nectar Teaspoon -- Pharyngeal -- Pharyngeal- Nectar Cup Reduced epiglottic inversion;Reduced anterior laryngeal mobility;Reduced laryngeal elevation;Reduced airway/laryngeal closure;Pharyngeal residue - valleculae;Pharyngeal residue - pyriform;Pharyngeal residue - posterior pharnyx;Trace aspiration;Penetration/Aspiration during swallow;Penetration/Apiration after swallow Pharyngeal Material enters airway, remains ABOVE vocal cords and not ejected out;Material enters airway, CONTACTS cords and not ejected out;Material enters airway, passes BELOW cords without attempt by patient to eject out (silent aspiration) Pharyngeal- Nectar Straw Reduced epiglottic inversion;Reduced anterior laryngeal mobility;Reduced laryngeal elevation;Reduced airway/laryngeal closure;Pharyngeal residue - valleculae;Pharyngeal residue - pyriform;Pharyngeal residue - posterior pharnyx;Trace aspiration;Penetration/Aspiration during swallow;Penetration/Apiration after swallow Pharyngeal Material enters airway, remains ABOVE vocal cords and not ejected out;Material enters airway, CONTACTS cords and not ejected out;Material enters airway, passes BELOW cords without attempt by patient to eject out (silent aspiration) Pharyngeal- Thin Teaspoon -- Pharyngeal -- Pharyngeal- Thin Cup Reduced epiglottic inversion;Reduced  anterior laryngeal mobility;Reduced laryngeal elevation;Reduced airway/laryngeal closure;Pharyngeal residue - valleculae;Pharyngeal residue -  pyriform;Pharyngeal residue - posterior pharnyx;Trace aspiration;Penetration/Aspiration during swallow;Penetration/Apiration after swallow Pharyngeal Material enters airway, remains ABOVE vocal cords and not ejected out;Material enters airway, CONTACTS cords and not ejected out;Material enters airway, passes BELOW cords without attempt by patient to eject out (silent aspiration) Pharyngeal- Thin Straw Reduced epiglottic inversion;Reduced anterior laryngeal mobility;Reduced laryngeal elevation;Reduced airway/laryngeal closure;Pharyngeal residue - valleculae;Pharyngeal residue - pyriform;Pharyngeal residue - posterior pharnyx;Trace aspiration;Penetration/Aspiration during swallow;Penetration/Apiration after swallow Pharyngeal Material enters airway, remains ABOVE vocal cords and not ejected out;Material enters airway, CONTACTS cords and not ejected out;Material enters airway, passes BELOW cords without attempt by patient to eject out (silent aspiration) Pharyngeal- Puree Reduced epiglottic inversion;Reduced anterior laryngeal mobility;Reduced laryngeal elevation;Reduced airway/laryngeal closure;Pharyngeal residue - valleculae;Pharyngeal residue - pyriform;Pharyngeal residue - posterior pharnyx Pharyngeal -- Pharyngeal- Mechanical Soft -- Pharyngeal -- Pharyngeal- Regular Reduced epiglottic inversion;Reduced anterior laryngeal mobility;Reduced laryngeal elevation;Reduced airway/laryngeal closure;Pharyngeal residue - valleculae;Pharyngeal residue - pyriform;Pharyngeal residue - posterior pharnyx Pharyngeal -- Pharyngeal- Multi-consistency -- Pharyngeal -- Pharyngeal- Pill Reduced epiglottic inversion;Reduced anterior laryngeal mobility;Reduced laryngeal elevation;Reduced airway/laryngeal closure;Pharyngeal residue - valleculae;Pharyngeal residue - pyriform;Pharyngeal residue - posterior pharnyx Pharyngeal -- Pharyngeal Comment --  CHL IP CERVICAL ESOPHAGEAL PHASE 05/13/2021 Cervical Esophageal Phase Impaired Pudding  Teaspoon -- Pudding Cup -- Honey Teaspoon -- Honey Cup -- Nectar Teaspoon -- Nectar Cup -- Nectar Straw -- Thin Teaspoon (No Data) Thin Cup -- Thin Straw -- Puree -- Mechanical Soft -- Regular -- Multi-consistency -- Pill -- Cervical Esophageal Comment -- Shanika I. Hardin Negus, [place], [place] Office number [phone] Pager [phone] Horton Marshall 05/13/2021, 4:04 PM              DG ESOPHAGUS W SINGLE CM (SOL OR THIN BA)  Result Date: 05/17/2021 CLINICAL DATA:  Difficulty swallowing.  Question of stricture. EXAM: ESOPHOGRAM/BARIUM SWALLOW TECHNIQUE: Single contrast examination was performed using  thin barium. FLUOROSCOPY TIME:  Fluoroscopy Time:  2 minutes off seconds Radiation Exposure Index (if provided by the fluoroscopic device): 24.5 Number of Acquired Spot Images: 0 COMPARISON:  Swallow function from May 13, 2021. FINDINGS: Only small volumes could be swallowed due to aspiration risk. There was laryngeal penetration and small volume silent aspiration noted during the examination with thin barium. Cough with clearing. Esophagus patulous and with signs of dysmotility. Contrast passes into the stomach from the esophagus. Limited primary wave was noted but patient not position in standard fashion due to debilitated state assessment of motility. No strong tertiary peristaltic activity was demonstrated. No gross evidence of w gastroesophageal reflux was visualized on the evaluation. Stasis in the esophagus was noted during the study there was no gross sign of stricture. Tablet not administered due to altered mental status and risk for aspiration. There was reflux of contrast from the distal into the more proximal esophagus during observation at the into the study with considerable stasis. IMPRESSION: Signs of dysmotility without gross signs of stricture on limited assessment. Laryngeal penetration with small volume silent aspiration. Due to this finding further evaluation with  thick barium or barium tablet was not performed. Electronically Signed   By: Zetta Bills M.D.   On: 05/17/2021 09:40    Subjective: Feels well, eager to go home. No dyspnea or chest pain.  Discharge Exam: Vitals:   05/19/21 0659 05/19/21 1341  BP:  131/71  Pulse:  93  Resp:  19  Temp:  97.7 F (36.5 C)  SpO2: 93% 99%   General: Pt is  alert, awake, not in acute distress Cardiovascular: RRR, S1/S2 +, no rubs, no gallops Respiratory: CTA bilaterally, no wheezing, no rhonchi Abdominal: Soft, NT, ND, bowel sounds + Extremities: No edema, no cyanosis  Labs: BNP (last 3 results) Recent Labs    05/12/21 1416  BNP 81.0   Basic Metabolic Panel: Recent Labs  Lab 05/13/21 0227 05/14/21 0407 05/15/21 0305 05/19/21 0213  NA 139 138 140 140  K 4.2 3.2* 3.9 3.0*  CL 100 99 103 103  CO2 30 33* 31 31  GLUCOSE 82 104* 97 99  BUN 10 6 6 6   CREATININE 0.89 0.72 0.71 0.68  CALCIUM 9.1 8.8* 8.8* 8.6*   Liver Function Tests: Recent Labs  Lab 05/14/21 0407 05/15/21 0305 05/19/21 0213  AST 32 30 19  ALT 32 31 21  ALKPHOS 56 47 41  BILITOT 0.8 0.7 0.7  PROT 6.2* 5.5* 5.4*  ALBUMIN 3.0* 2.6* 2.4*   No results for input(s): LIPASE, AMYLASE in the last 168 hours. No results for input(s): AMMONIA in the last 168 hours. CBC: Recent Labs  Lab 05/14/21 0407 05/15/21 0305 05/19/21 0213  WBC 4.9 3.2* 3.7*  HGB 14.0 12.7* 12.7*  HCT 44.9 40.9 40.1  MCV 98.7 97.6 94.6  PLT 124* 116* 174   Cardiac Enzymes: Recent Labs  Lab 05/12/21 1817  CKTOTAL 127   BNP: Invalid input(s): POCBNP CBG: No results for input(s): GLUCAP in the last 168 hours. D-Dimer No results for input(s): DDIMER in the last 72 hours. Hgb A1c No results for input(s): HGBA1C in the last 72 hours. Lipid Profile No results for input(s): CHOL, HDL, LDLCALC, TRIG, CHOLHDL, LDLDIRECT in the last 72 hours. Thyroid function studies No results for input(s): TSH, T4TOTAL, T3FREE, THYROIDAB in the last 72  hours.  Invalid input(s): FREET3 Anemia work up No results for input(s): VITAMINB12, FOLATE, FERRITIN, TIBC, IRON, RETICCTPCT in the last 72 hours. Urinalysis    Component Value Date/Time   COLORURINE YELLOW 05/12/2021 1831   APPEARANCEUR CLEAR 05/12/2021 1831   LABSPEC 1.020 05/12/2021 1831   PHURINE 5.0 05/12/2021 1831   GLUCOSEU NEGATIVE 05/12/2021 1831   HGBUR NEGATIVE 05/12/2021 1831   BILIRUBINUR NEGATIVE 05/12/2021 1831   KETONESUR NEGATIVE 05/12/2021 1831   PROTEINUR NEGATIVE 05/12/2021 1831   UROBILINOGEN 0.2 11/22/2007 2045   NITRITE NEGATIVE 05/12/2021 1831   LEUKOCYTESUR NEGATIVE 05/12/2021 1831    Microbiology Recent Results (from the past 240 hour(s))  Culture, blood (routine x 2)     Status: None   Collection Time: 05/12/21 11:45 AM   Specimen: BLOOD  Result Value Ref Range Status   Specimen Description BLOOD LEFT ANTECUBITAL  Final   Special Requests   Final    BOTTLES DRAWN AEROBIC AND ANAEROBIC Blood Culture results may not be optimal due to an inadequate volume of blood received in culture bottles   Culture   Final    NO GROWTH 5 DAYS Performed at [facility], [place] [address] [place], [place] [address]    Report Status 05/17/2021 FINAL  Final  Resp Panel by RT-PCR (Flu A&B, Covid) Nasopharyngeal Swab     Status: None   Collection Time: 05/12/21 11:59 AM   Specimen: Nasopharyngeal Swab; Nasopharyngeal(NP) swabs in vial transport medium  Result Value Ref Range Status   SARS Coronavirus 2 by RT PCR NEGATIVE NEGATIVE Final    Comment: (NOTE) SARS-CoV-2 target nucleic acids are NOT DETECTED.  The SARS-CoV-2 RNA is generally detectable in upper respiratory specimens during the acute  phase of infection. The lowest concentration of SARS-CoV-2 viral copies this assay can detect is 138 copies/mL. A negative result does not preclude SARS-Cov-2 infection and should not be used as the sole basis for treatment or other patient management decisions. A  negative result may occur with  improper specimen collection/handling, submission of specimen other than nasopharyngeal swab, presence of viral mutation(s) within the areas targeted by this assay, and inadequate number of viral copies(<138 copies/mL). A negative result must be combined with clinical observations, patient history, and epidemiological information. The expected result is Negative.  Fact Sheet for Patients:  EntrepreneurPulse.com.au  Fact Sheet for Healthcare Providers:  IncredibleEmployment.be  This test is no t yet approved or cleared by the [place] FDA and  has been authorized for detection and/or diagnosis of SARS-CoV-2 by FDA under an Emergency Use Authorization (EUA). This EUA will remain  in effect (meaning this test can be used) for the duration of the COVID-19 declaration under Section 564(b)(1) of the Act, 21 U.S.C.section 360bbb-3(b)(1), unless the authorization is terminated  or revoked sooner.       Influenza A by PCR NEGATIVE NEGATIVE Final   Influenza B by PCR NEGATIVE NEGATIVE Final    Comment: (NOTE) The Xpert Xpress SARS-CoV-2/FLU/RSV plus assay is intended as an aid in the diagnosis of influenza from Nasopharyngeal swab specimens and should not be used as a sole basis for treatment. Nasal washings and aspirates are unacceptable for Xpert Xpress SARS-CoV-2/FLU/RSV testing.  Fact Sheet for Patients: EntrepreneurPulse.com.au  Fact Sheet for Healthcare Providers: IncredibleEmployment.be  This test is not yet approved or cleared by the [place] FDA and has been authorized for detection and/or diagnosis of SARS-CoV-2 by FDA under an Emergency Use Authorization (EUA). This EUA will remain in effect (meaning this test can be used) for the duration of the COVID-19 declaration under Section 564(b)(1) of the Act, 21 U.S.C. section 360bbb-3(b)(1), unless the authorization  is terminated or revoked.  Performed at [facility], [place] [address], [place], [place] [address]   Culture, blood (routine x 2)     Status: None   Collection Time: 05/12/21 10:00 PM   Specimen: BLOOD  Result Value Ref Range Status   Specimen Description BLOOD RIGHT HAND  Final   Special Requests   Final    BOTTLES DRAWN AEROBIC ONLY Blood Culture results may not be optimal due to an inadequate volume of blood received in culture bottles   Culture   Final    NO GROWTH 5 DAYS Performed at [facility], [place] [address] [place], [place] [address]    Report Status 05/17/2021 FINAL  Final    Time coordinating discharge: Approximately 40 minutes  Patrecia Pour, MD  [facility] 05/19/2021, 2:52 PM

## 2021-05-20 ENCOUNTER — Telehealth: Payer: Self-pay

## 2021-05-20 NOTE — Telephone Encounter (Signed)
Noted: nurse phone contact with patient for TCM. Signed:  Crissie Sickles, MD           05/20/2021

## 2021-05-20 NOTE — Telephone Encounter (Signed)
Transition Care Management Follow-up Telephone Call  Date of discharge and from where: 5/25. [facility]  How have you been since you were released from the hospital? Per wife-patient has had a rough morning. Started Cpap last night. Woke up paranoid & angry this morning. PT is coming tomorrow & RT is coming back to check Cpap tomorrow.  Any questions or concerns? No  Items Reviewed:  Did the pt receive and understand the discharge instructions provided? Yes   Medications obtained and verified? Yes   Other? Yes   Any new allergies since your discharge? No   Dietary orders reviewed? Yes  Do you have support at home? Yes   Home Care and Equipment/Supplies: Were home health services ordered? yes If so, what is the name of the agency? Wife does not know name  Has the agency set up a time to come to the patient's home? yes Were any new equipment or medical supplies ordered?  Yes: Cpap What is the name of the medical supply agency? [employer] Were you able to get the supplies/equipment? yes Do you have any questions related to the use of the equipment or supplies? No  Functional Questionnaire: (I = Independent and D = Dependent) ADLs: D with most ADLs  Bathing/Dressing- D  Meal Prep- D  Eating- I  Maintaining continence- I with assistance  Transferring/Ambulation- I with walker/ WC with assistance  Managing Meds- D  Follow up appointments reviewed:   PCP Hospital f/u appt confirmed? Yes  Scheduled to see Dr. Anitra Lauth on 05/31/2021 @ 11:00.  [facility] f/u appt confirmed? No  Patient to schedule with neurology  Are transportation arrangements needed? No   If their condition worsens, is the pt aware to call PCP or go to the Emergency Dept.? Yes  Was the patient provided with contact information for the PCP's office or ED? Yes  Was to pt encouraged to call back with questions or concerns? Yes

## 2021-05-21 ENCOUNTER — Other Ambulatory Visit: Payer: Self-pay | Admitting: *Deleted

## 2021-05-21 ENCOUNTER — Telehealth: Payer: Self-pay | Admitting: Family Medicine

## 2021-05-21 NOTE — Telephone Encounter (Signed)
Maggie w/[facility] (Other) [phone]   Called to advise [facility] will not be able to see pt this weekend. Maggie spoke with pt and he is ok with delay. They will go out for PT & OT on 05/26/2021.

## 2021-05-21 NOTE — Patient Outreach (Signed)
[facility]) Care Management  05/21/2021  BRYLIN STANISLAWSKI 1961-12-17 [id number]    Telephone Assessment Post discharge follow up.   Referral Date :11/16/20 Referral source: PCP office  Referral reason : Social worker services, son unable to work due to caring for patient , Are there financial services available to help family?  Insurance : [employer]     Subjective: Outreach call to patient wife [name] for after discharge call, she states not being at home at this time, she requested to return call to me later today.  Incoming call from [place], she reports patient seems to be sleeping more since wearing Bipap she reports patient agreeable with wearing device no supplemental oxygen required. She discussed she has been notified by [facility] that they will not be able to visit until next instead of over the weekend. She plans to call Tuesday morning if no sooner return call. I  offered call at another time due to her commitment with another family member and patient she is appreciative and states that would work best for her. . I discussed any additional concerns, care coordination or  resources information for follow up at this time , she declined.   Objective:  Mr. 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 Comorbidities include: Myotonic Muscular Dystrophy, Hypothyroidism, anxiety depression.  He was discharged to home on 05/20/21 [facility] services PT/OT/ST and DME of Bipap by [place] Patient wife agrees to follow up call in the next 4 business day  at preferred date and time.    [name], RN, BSN  [place] Management Coordinator  [phone]- Mobile [phone]-  Toll Free Main Office

## 2021-05-26 ENCOUNTER — Other Ambulatory Visit: Payer: Self-pay | Admitting: *Deleted

## 2021-05-26 DIAGNOSIS — F419 Anxiety disorder, unspecified: Secondary | ICD-10-CM | POA: Diagnosis not present

## 2021-05-26 DIAGNOSIS — R1312 Dysphagia, oropharyngeal phase: Secondary | ICD-10-CM | POA: Diagnosis not present

## 2021-05-26 DIAGNOSIS — G7111 Myotonic muscular dystrophy: Secondary | ICD-10-CM | POA: Diagnosis not present

## 2021-05-26 DIAGNOSIS — E039 Hypothyroidism, unspecified: Secondary | ICD-10-CM | POA: Diagnosis not present

## 2021-05-26 DIAGNOSIS — J690 Pneumonitis due to inhalation of food and vomit: Secondary | ICD-10-CM | POA: Diagnosis not present

## 2021-05-26 DIAGNOSIS — F32A Depression, unspecified: Secondary | ICD-10-CM | POA: Diagnosis not present

## 2021-05-26 DIAGNOSIS — J9620 Acute and chronic respiratory failure, unspecified whether with hypoxia or hypercapnia: Secondary | ICD-10-CM | POA: Diagnosis not present

## 2021-05-26 DIAGNOSIS — G4700 Insomnia, unspecified: Secondary | ICD-10-CM | POA: Diagnosis not present

## 2021-05-26 DIAGNOSIS — G4733 Obstructive sleep apnea (adult) (pediatric): Secondary | ICD-10-CM | POA: Diagnosis not present

## 2021-05-26 NOTE — Patient Outreach (Signed)
[name]) Care Management  [facility] Care Manager  05/26/2021   [name] 1961/09/10 [id number]   Telephone Assessment   Patient is 60 year old male with history of Myotonic muscular dystrophy, wheelchair bound, hypothyroidism.   Subjective:  Successful outreach call to patient wife, [name], designated party release.  She discussed patient seems to be adjusting to wearing Bipap , he has worn for the last 3 night. She discussed alarms during the night due to not having good seal on mask, she plans to return call to [place], that has visited home once since discharge.  Patient is active with [facility] with initial visit on 5/29, noted patient with physical decline compared to there prior interaction with patient in last 2 months, 2+ assistance.  She denies patient having worsening cough, , fever.  She discussed that he is tolerating meals with swallowing precautions with needing reminding with cough after swallowing liquids .   Objective:   Encounter Medications:  Outpatient Encounter Medications as of 05/26/2021  Medication Sig  . acetaminophen (TYLENOL) 500 MG tablet Take 1,000 mg by mouth at bedtime.  Marland Kitchen amphetamine-dextroamphetamine (ADDERALL XR) 10 MG 24 hr capsule TAKE 1 CAPSULE BY MOUTH ONCE DAILY (TO BE TAKEN WITH 30 MG XR CAPS) (Patient taking differently: Take 10 mg by mouth daily.)  . amphetamine-dextroamphetamine (ADDERALL XR) 30 MG 24 hr capsule TAKE 1 CAPSULE BY MOUTH ONCE DAILY (Patient taking differently: Take 30 mg by mouth daily.)  . clotrimazole-betamethasone (LOTRISONE) cream APPLY TO THE AFFECTED AREA(S) TWICE DAILY AS NEEDED (Patient taking differently: Apply 1 application topically 2 (two) times daily as needed (to affected areas).)  . FLUoxetine (PROZAC) 40 MG capsule TAKE 1 CAPSULE BY MOUTH ONCE DAILY (Patient taking differently: Take 40 mg by mouth daily.)  . glycopyrrolate (ROBINUL) 2 MG tablet TAKE 1/2 TO 1 TABLET BY  MOUTH TWO TIMES DAILY AS NEEDED FOR EXCESSIVE ORAL SECRETIONS (Patient taking differently: Take 1-2 mg by mouth See admin instructions. Take 2 mg by mouth at bedtime and an additional 1-2 mg once a day as needed FOR EXCESSIVE ORAL SECRETIONS)  . levothyroxine (SYNTHROID) 150 MCG tablet TAKE 1 TABLET BY MOUTH ONCE DAILY EXCEPT FOR SUNDAY SKIP DOSE (Patient taking differently: Take 150 mcg by mouth See admin instructions. Take 150 mcg by mouth in the morning before breakfast on Mon/Tues/Wed/Thurs/Fri/Sat- skip Sundays)  . Melatonin 10 MG CAPS Take 10 mg by mouth at bedtime.  Marland Kitchen omeprazole (PRILOSEC) 40 MG capsule TAKE 1 CAPSULE BY MOUTH ONCE DAILY (Patient taking differently: Take 40 mg by mouth daily before breakfast.)  . ondansetron (ZOFRAN) 8 MG tablet Take 1 tablet (8 mg total) by mouth every 8 (eight) hours as needed for nausea or vomiting.  . Simethicone 125 MG TABS Take 125 mg by mouth every 6 (six) hours as needed for flatulence.  . traZODone (DESYREL) 100 MG tablet Take 2 tablets by mouth daily at bedtime. (Patient taking differently: Take 200 mg by mouth at bedtime.)   No facility-administered encounter medications on file as of 05/26/2021.    Functional Status:  In your present state of health, do you have any difficulty performing the following activities: 05/12/2021 12/07/2020  Hearing? N N  Vision? N N  Difficulty concentrating or making decisions? N N  Walking or climbing stairs? Y Y  Comment - Patient has Myotonic Muscular Dystrophy - wife and son assist  Dressing or bathing? Y Y  Comment - Patient has Myotonic Muscular Dystrophy -  wife and son assist  Doing errands, shopping? Y Y  Comment - Patient has Myotonic Muscular Dystrophy - wife and son assist  Preparing Food and eating ? - Y  Comment - Patient has Myotonic Muscular Dystrophy - wife and son assist  Using the Toilet? - Y  Comment - Patient has Myotonic Muscular Dystrophy - wife and son assist  In the past six months, have  you accidently leaked urine? - Y  Comment - Patient has Myotonic Muscular Dystrophy - wife and son assist  Do you have problems with loss of bowel control? - N  Managing your Medications? - Y  Comment - Patient has Myotonic Muscular Dystrophy - wife and son assist  Managing your Finances? - Y  Comment - Patient has Myotonic Muscular Dystrophy - wife and son assist  Housekeeping or managing your Housekeeping? - Y  Comment - Patient has Myotonic Muscular Dystrophy - wife and son assist  Some recent data might be hidden    Fall/Depression Screening: Fall Risk  12/07/2020  Falls in the past year? 1  Number falls in past yr: 1  Injury with Fall? 1  Risk for fall due to : History of fall(s);Impaired balance/gait;Impaired mobility  Follow up Education provided;Falls prevention discussed   PHQ 2/9 Scores 03/29/2021 12/07/2020 08/02/2019 02/12/2019 07/17/2018 04/11/2018 01/10/2018  PHQ - 2 Score 5 4 6 2 3 3 5   PHQ- 9 Score 21 7 19 12 13 16 17     Assessment:  Goals Addressed            This Visit's Progress   . Find Help in My Community   On track    Timeframe:  Short-Term Goal Priority:  High Start Date:   12/01/20                          Expected End Date:  07/24/21                Follow Up Date 06/16/21   - follow-up on any referrals for help I am given    Why is this important?    Knowing how and where to find help for yourself or family in your neighborhood and community is an important skill.   You will want to take some steps to learn how.    Notes:  Reviewed [facility] home health visit completed reviewed home health services , physical therapy and outreach from OT to set up visit. . Encouraged follow up call to DME company regarding concerns with DME. Verified patient wife has prior information sent on personal care services     . [place] My Quality of Life   On track    Timeframe:  Long-Range Goal Priority:  Medium Start Date:     12/01/20                        Expected  End Date:   07/24/21               Follow Up Date 06/16/21   - discuss my treatment options with the doctor or nurse Reviewed palliative care services for support with navigating chronic condition    Why is this important?    Having a long-term illness can be scary.   It can also be stressful for you and your caregiver.   These steps may help.    Notes:Verified that wife has information and packet on preparing Advanced Directive .  Encouraged discussing advanced care plans , palliative care referral         Plan:  Follow-up:  Patient agrees to Care Plan and Follow-up.Will schedule follow up call in the next 3 weeks per wife agreement .  Will send this visit note to PCP.   [name], RN, BSN  [place] Management Coordinator  [phone]- Mobile [phone]- Toll Free Main Office

## 2021-05-27 ENCOUNTER — Telehealth: Payer: Self-pay

## 2021-05-27 DIAGNOSIS — F32A Depression, unspecified: Secondary | ICD-10-CM | POA: Diagnosis not present

## 2021-05-27 DIAGNOSIS — G4700 Insomnia, unspecified: Secondary | ICD-10-CM | POA: Diagnosis not present

## 2021-05-27 DIAGNOSIS — F419 Anxiety disorder, unspecified: Secondary | ICD-10-CM | POA: Diagnosis not present

## 2021-05-27 DIAGNOSIS — R1312 Dysphagia, oropharyngeal phase: Secondary | ICD-10-CM | POA: Diagnosis not present

## 2021-05-27 DIAGNOSIS — E039 Hypothyroidism, unspecified: Secondary | ICD-10-CM | POA: Diagnosis not present

## 2021-05-27 DIAGNOSIS — G7111 Myotonic muscular dystrophy: Secondary | ICD-10-CM | POA: Diagnosis not present

## 2021-05-27 DIAGNOSIS — J9620 Acute and chronic respiratory failure, unspecified whether with hypoxia or hypercapnia: Secondary | ICD-10-CM | POA: Diagnosis not present

## 2021-05-27 DIAGNOSIS — G4733 Obstructive sleep apnea (adult) (pediatric): Secondary | ICD-10-CM | POA: Diagnosis not present

## 2021-05-27 DIAGNOSIS — J690 Pneumonitis due to inhalation of food and vomit: Secondary | ICD-10-CM | POA: Diagnosis not present

## 2021-05-27 NOTE — Telephone Encounter (Signed)
FYI  Please see below

## 2021-05-27 NOTE — Telephone Encounter (Signed)
Noted  

## 2021-05-27 NOTE — Telephone Encounter (Signed)
Please advise on v/o 

## 2021-05-27 NOTE — Telephone Encounter (Signed)
[name], from [facility] calling about home health visits. Requesting for PT at the home  x2 weekly visits for x5 weeks  I did share with her his next appt which is a Hospital Follow up visit  [name] can be reached at [phone]

## 2021-05-28 ENCOUNTER — Ambulatory Visit: Payer: 59 | Admitting: Family Medicine

## 2021-05-28 ENCOUNTER — Other Ambulatory Visit: Payer: Self-pay

## 2021-05-28 NOTE — Telephone Encounter (Signed)
Yes, okay.

## 2021-05-28 NOTE — Telephone Encounter (Signed)
V/o given 

## 2021-05-31 ENCOUNTER — Ambulatory Visit: Payer: 59 | Admitting: Family Medicine

## 2021-05-31 ENCOUNTER — Encounter: Payer: Self-pay | Admitting: Family Medicine

## 2021-05-31 ENCOUNTER — Other Ambulatory Visit: Payer: Self-pay

## 2021-05-31 VITALS — BP 101/69 | HR 87 | Temp 98.0°F | Resp 16 | Ht 64.0 in | Wt 224.0 lb

## 2021-05-31 DIAGNOSIS — T17908D Unspecified foreign body in respiratory tract, part unspecified causing other injury, subsequent encounter: Secondary | ICD-10-CM | POA: Diagnosis not present

## 2021-05-31 DIAGNOSIS — R531 Weakness: Secondary | ICD-10-CM

## 2021-05-31 DIAGNOSIS — K224 Dyskinesia of esophagus: Secondary | ICD-10-CM | POA: Diagnosis not present

## 2021-05-31 DIAGNOSIS — R627 Adult failure to thrive: Secondary | ICD-10-CM

## 2021-05-31 DIAGNOSIS — Z7409 Other reduced mobility: Secondary | ICD-10-CM | POA: Diagnosis not present

## 2021-05-31 DIAGNOSIS — F988 Other specified behavioral and emotional disorders with onset usually occurring in childhood and adolescence: Secondary | ICD-10-CM

## 2021-05-31 DIAGNOSIS — G4719 Other hypersomnia: Secondary | ICD-10-CM

## 2021-05-31 DIAGNOSIS — G7111 Myotonic muscular dystrophy: Secondary | ICD-10-CM

## 2021-05-31 DIAGNOSIS — E876 Hypokalemia: Secondary | ICD-10-CM

## 2021-05-31 DIAGNOSIS — J690 Pneumonitis due to inhalation of food and vomit: Secondary | ICD-10-CM

## 2021-05-31 NOTE — Progress Notes (Signed)
05/31/2021  CC:  Chief Complaint  Patient presents with  . Hospitalization Follow-up   Patient is a 60 y.o. Caucasian male who presents accompanied by his wife [name] and son [name] for  hospital follow up, specifically [facility] face-to-face visit. Dates hospitalized: 5/18-5/25, 2022. Days since d/c from hospital: 12 days. Patient was discharged from hospital to home with home health support. Reason for admission to hospital: generalized weakness and increased secretions->found to have acute hypoxic and hypercapnic RF d/t aspiration pneumonia, untreated OSA, and progressive weakness d/t myotonic dystrophy. Date of interactive (phone) contact with patient and/or caregiver:05/20/21.  I have reviewed patient's discharge summary plus pertinent specific notes, labs, and imaging from the hospitalization.   Pt improved with supp oxygen, abx, bipap, aspiration precautions.   Upon d/c he was to continue bipap and supplemental oxygen.  Pt has oropharyngeal dysphagia + esophageal dysmotility, suspected progressive sequelae from myotonic dystrophy.  He was to f/u with GI, continue home robinul, and continue dysphagia 3 diuet per SLP.    CURRENTLY: Very happy to be home. Fatigue is getting slightly better. Intake PO has decreased some, still with coughing with eating-"wet sounding" at times. GI said in hosp that no stricture present, feeding tube not advised and not wanted by pt, EGD not advised. He uses Bipap some nights after going home, 4-6 hours each time, refuses to wear last several days. RT is going to try diff masks soon.   Needs lift chair.  He has inability to get out of chair by himself.  Can't sit on side of bed by himself.  When he is assisted to his feet he can only ambulate about 1 step before needing to sit down. Not checking oxygen level at home, not using supp oxygen at home. He drinks water but sounds like not much, eats soft diet in small bites. K was 3.0 on day  of d/c home-->He is not taking any potassium supplement.  Medication reconciliation was done today and patient is taking meds as recommended by discharging hospitalist/specialist.    ROS as above, plus--> no fevers, no CP, no SOB, no wheezing,  no dizziness, no HAs, no rashes, no melena/hematochezia.  No polyuria or polydipsia.  No myalgias or arthralgias.  No focal weakness, paresthesias, or tremors.  No acute vision or hearing abnormalities.  No dysuria or unusual/new urinary urgency or frequency.  No recent changes in lower legs. No n/v/d or abd pain.  No palpitations.    PMH:  Past Medical History:  Diagnosis Date  . Adult ADHD   . Aspiration pneumonitis ([place]) 04/2017, 08/2017   Recurrent  . Chronic respiratory failure ([place])    As of 04/2018, Dr. Chase Caller suspects hypercapnic RF--but [name] normal.  May eventually need nocturnal noninvasive ventilation.  Ultimately, if progresses enough pt will need trach.  . Constipation, chronic    Has caused irritative urinary/bladder symptoms in the past.  . Depression   . Dizziness 06/2011+   Noncontrast CT and noncontrast MRI both negative 06/2011 at [facility] ED visit.  . Elevated transaminase level    Liver biopsy revealed fatty liver  . Erectile dysfunction   . Facial fracture ([place]) 06/2011   Nondisplaced bilateral [place] I fracture: no surgery required. (ENT, Dr. Wilburn Cornelia)   . Fatty liver disease, nonalcoholic   . GERD (gastroesophageal reflux disease)   . History of thyroid cancer 2010   Medullary carcinoma of thyroid. Thyroidectomy 08/2009 (Dr. Aida Puffer at [facility]).  Needs periodic serum calcitonin monitoring.  Marland Kitchen Hyperlipidemia   .  Hypothyroidism   . LVH (left ventricular hypertrophy)    by EKG criteria 06/2011.  Hx of heart murmur--Followed by cardiology at [facility]: EF's have been "stable" per cardio office note 12/29/10.  His echo 12/2012 showed normal LV size and wall thickness but EF 45-50% and global LV hypokinesis was found.  . Myotonic  dystrophy ([place]) Dx'd 1996   Progressive muscle weakness and swallowing dysfunction.  Initial dx by Dr. Jannifer Franklin, neurologist, but subsequent annual specialist f/u has been with Dr. Ladon Applebaum [facility].  Progressive debilitation--PT referral 04/2017, pt quit PT 05/2017.  . Nephrolithiasis   . Obesity, Class I, BMI 30-34.9   . OSA (obstructive sleep apnea)    SEVERE 08/2019 sleep study). CPAP to be initiated as of 04/18/20 pulm (Dr. Annamaria Boots)  . Pulmonary nodule, right 05/2017   RUL, 7 mm sub solid nodule.  Resolution noted on f/u CT 08/2017.    PSH:  Past Surgical History:  Procedure Laterality Date  . COLONOSCOPY  09/13/2007   normal (Dr. Deatra Ina).  . EXTRACORPOREAL SHOCK WAVE LITHOTRIPSY    . Home sleep study  08/31/2019   Severe OSA  . THYROIDECTOMY  08/2009   for medullary thyroid cancer ([facility] Conley Canal [facility] him for this).  . TRANSTHORACIC ECHOCARDIOGRAM  12/2012; 09/2017   2014-Normal LV size and wall thickness, LV EF 45-50%, with global hypokinesis of LV.  2018- EF 50-55%, normal wall motion, grd I DD, valves normal.    MEDS:  Outpatient Medications Prior to Visit  Medication Sig Dispense Refill  . acetaminophen (TYLENOL) 500 MG tablet Take 1,000 mg by mouth at bedtime.    Marland Kitchen amphetamine-dextroamphetamine (ADDERALL XR) 10 MG 24 hr capsule TAKE 1 CAPSULE BY MOUTH ONCE DAILY (TO BE TAKEN WITH 30 MG XR CAPS) (Patient taking differently: Take 10 mg by mouth daily.) 30 capsule 0  . amphetamine-dextroamphetamine (ADDERALL XR) 30 MG 24 hr capsule TAKE 1 CAPSULE BY MOUTH ONCE DAILY (Patient taking differently: Take 30 mg by mouth daily.) 30 capsule 0  . clotrimazole-betamethasone (LOTRISONE) cream APPLY TO THE AFFECTED AREA(S) TWICE DAILY AS NEEDED (Patient taking differently: Apply 1 application topically 2 (two) times daily as needed (to affected areas).) 45 g 1  . FLUoxetine (PROZAC) 40 MG capsule TAKE 1 CAPSULE BY MOUTH ONCE DAILY (Patient taking differently: Take 40 mg by mouth daily.) 90  capsule 1  . glycopyrrolate (ROBINUL) 2 MG tablet TAKE 1/2 TO 1 TABLET BY MOUTH TWO TIMES DAILY AS NEEDED FOR EXCESSIVE ORAL SECRETIONS (Patient taking differently: Take 1-2 mg by mouth See admin instructions. Take 2 mg by mouth at bedtime and an additional 1-2 mg once a day as needed FOR EXCESSIVE ORAL SECRETIONS) 60 tablet 6  . levothyroxine (SYNTHROID) 150 MCG tablet TAKE 1 TABLET BY MOUTH ONCE DAILY EXCEPT FOR SUNDAY SKIP DOSE (Patient taking differently: Take 150 mcg by mouth See admin instructions. Take 150 mcg by mouth in the morning before breakfast on Mon/Tues/Wed/Thurs/Fri/Sat- skip Sundays) 28 tablet 2  . Melatonin 10 MG CAPS Take 10 mg by mouth at bedtime.    Marland Kitchen omeprazole (PRILOSEC) 40 MG capsule TAKE 1 CAPSULE BY MOUTH ONCE DAILY (Patient taking differently: Take 40 mg by mouth daily before breakfast.) 90 capsule 2  . ondansetron (ZOFRAN) 8 MG tablet Take 1 tablet (8 mg total) by mouth every 8 (eight) hours as needed for nausea or vomiting. 20 tablet 1  . Simethicone 125 MG TABS Take 125 mg by mouth every 6 (six) hours as needed for flatulence.    Marland Kitchen  traZODone (DESYREL) 100 MG tablet Take 2 tablets by mouth daily at bedtime. (Patient taking differently: Take 200 mg by mouth at bedtime.) 180 tablet 3   No facility-administered medications prior to visit.  EXAM:  Vitals with BMI 05/31/2021 05/19/2021 05/19/2021  Height 5\' 4"  - -  Weight 224 lbs - -  BMI 54.56 - -  Systolic 256 389 373  Diastolic 69 71 60  Pulse 87 93 81   Gen: Alert, tired- appearing but NAD.  Patient is oriented to person, place, time, and situation. Flat affect as per his usual.  Answers questions in 1-2 words. Oral: dry MM's CV: RRR, no m/r/g.   LUNGS: CTA bilat, nonlabored resps, good aeration in all lung fields. EXT: no clubbing or cyanosis.  2+ R LL pitting edema, and trace to 1 + L LL pitting edema.  He can sit forward in [facility] some on his own and moves all extremities but he's unable to push himself out of his  chair.  Pertinent labs/imaging   Chemistry      Component Value Date/Time   NA 140 05/19/2021 0213   K 3.0 (L) 05/19/2021 0213   CL 103 05/19/2021 0213   CO2 31 05/19/2021 0213   BUN 6 05/19/2021 0213   CREATININE 0.68 05/19/2021 0213   CREATININE 0.95 02/20/2013 1604      Component Value Date/Time   CALCIUM 8.6 (L) 05/19/2021 0213   ALKPHOS 41 05/19/2021 0213   AST 19 05/19/2021 0213   ALT 21 05/19/2021 0213   BILITOT 0.7 05/19/2021 0213     Lab Results  Component Value Date   WBC 3.7 (L) 05/19/2021   HGB 12.7 (L) 05/19/2021   HCT 40.1 05/19/2021   MCV 94.6 05/19/2021   PLT 174 05/19/2021   Lab Results  Component Value Date   TSH 0.098 (L) 05/12/2021   T3TOTAL 83.5 02/20/2013  Free T4 05/12/21 was 1.63 (WNL)  Esophogram/barium swallow 05/17/21: IMPRESSION: Signs of dysmotility without gross signs of stricture on limited assessment.  Laryngeal penetration with small volume silent aspiration. Due to this finding further evaluation with thick barium or barium tablet was not performed.  ASSESSMENT/PLAN:  1) Hypoxic+ hypercapnic RF, acute on chronic. Resolved.  Needs to monitor oxygen sat periodically at home and supplement with oxygen prn sat <88%.  Needs to wear bipap but he refuses most of the time b/c discomfort and he's a side sleeper and this causes issues with mask seal coming off and sets alarm off on machine and wakes him up frequently. RT is set up with him and they'll be trouble shooting mask issues.  2) Esophageal dysmotility secondary to progressive myotonic dystrophy: aspiration is occurring, pt worked with SLP in [place], wants to continue eating soft diet.  Feeding tube not desired by pt and not recommended by GI. Glycopyrrolate 2 mg q6h prn. Omeprazole 40mg  qd.  3) Failure to thrive, progressive decline in general strength, needs at least partial care with all ADL's. Lift chair rx'd today. Palliative care referral requested by family and I ordered  this today.   He does not want hospice care at this time.  4) Hypokalemia in hosp recently, not sent home on oral potassium supp: recheck BMET today.  5) Adult ADD, chronic daytime somnolence: stable on 40mg  adderall xr qd at this time. New rx not needed today.  6) Recurrent MDD: stable on 40mg  fluoxetine qd. Uses trazodone 200 mg for insomnia.   Medical decision making of moderate complexity was utilized today.  FOLLOW UP:  3 mo  Signed:  Crissie Sickles, MD           05/31/2021

## 2021-06-01 DIAGNOSIS — J9620 Acute and chronic respiratory failure, unspecified whether with hypoxia or hypercapnia: Secondary | ICD-10-CM | POA: Diagnosis not present

## 2021-06-01 DIAGNOSIS — G7111 Myotonic muscular dystrophy: Secondary | ICD-10-CM | POA: Diagnosis not present

## 2021-06-01 DIAGNOSIS — G4733 Obstructive sleep apnea (adult) (pediatric): Secondary | ICD-10-CM | POA: Diagnosis not present

## 2021-06-01 DIAGNOSIS — J690 Pneumonitis due to inhalation of food and vomit: Secondary | ICD-10-CM | POA: Diagnosis not present

## 2021-06-01 DIAGNOSIS — F419 Anxiety disorder, unspecified: Secondary | ICD-10-CM | POA: Diagnosis not present

## 2021-06-01 DIAGNOSIS — F32A Depression, unspecified: Secondary | ICD-10-CM | POA: Diagnosis not present

## 2021-06-01 DIAGNOSIS — R1312 Dysphagia, oropharyngeal phase: Secondary | ICD-10-CM | POA: Diagnosis not present

## 2021-06-01 DIAGNOSIS — E039 Hypothyroidism, unspecified: Secondary | ICD-10-CM | POA: Diagnosis not present

## 2021-06-01 DIAGNOSIS — G4700 Insomnia, unspecified: Secondary | ICD-10-CM | POA: Diagnosis not present

## 2021-06-01 LAB — BASIC METABOLIC PANEL
BUN: 10 mg/dL (ref 6–23)
CO2: 35 mEq/L — ABNORMAL HIGH (ref 19–32)
Calcium: 9.2 mg/dL (ref 8.4–10.5)
Chloride: 100 mEq/L (ref 96–112)
Creatinine, Ser: 0.65 mg/dL (ref 0.40–1.50)
GFR: 102.34 mL/min (ref >60.00)
Glucose, Bld: 100 mg/dL — ABNORMAL HIGH (ref 70–99)
Potassium: 4.2 mEq/L (ref 3.5–5.1)
Sodium: 143 mEq/L (ref 135–145)

## 2021-06-02 ENCOUNTER — Telehealth: Payer: Self-pay

## 2021-06-02 NOTE — Telephone Encounter (Signed)
[name] (patient wife-DPR) dropped off FMLA paperwork to be completed and signed by Dr. Anitra Lauth. Placed forms into [name] box in front office area.  Please call [name] when completed  [phone]

## 2021-06-02 NOTE — Telephone Encounter (Signed)
Placed on PCP desk to review and sign, if appropriate.  

## 2021-06-02 NOTE — Telephone Encounter (Signed)
Currently working on form completion

## 2021-06-03 ENCOUNTER — Telehealth: Payer: Self-pay

## 2021-06-03 ENCOUNTER — Other Ambulatory Visit: Payer: Self-pay | Admitting: *Deleted

## 2021-06-03 DIAGNOSIS — G4700 Insomnia, unspecified: Secondary | ICD-10-CM | POA: Diagnosis not present

## 2021-06-03 DIAGNOSIS — J690 Pneumonitis due to inhalation of food and vomit: Secondary | ICD-10-CM | POA: Diagnosis not present

## 2021-06-03 DIAGNOSIS — F32A Depression, unspecified: Secondary | ICD-10-CM | POA: Diagnosis not present

## 2021-06-03 DIAGNOSIS — E039 Hypothyroidism, unspecified: Secondary | ICD-10-CM | POA: Diagnosis not present

## 2021-06-03 DIAGNOSIS — G7111 Myotonic muscular dystrophy: Secondary | ICD-10-CM | POA: Diagnosis not present

## 2021-06-03 DIAGNOSIS — J9620 Acute and chronic respiratory failure, unspecified whether with hypoxia or hypercapnia: Secondary | ICD-10-CM | POA: Diagnosis not present

## 2021-06-03 DIAGNOSIS — F419 Anxiety disorder, unspecified: Secondary | ICD-10-CM | POA: Diagnosis not present

## 2021-06-03 DIAGNOSIS — R1312 Dysphagia, oropharyngeal phase: Secondary | ICD-10-CM | POA: Diagnosis not present

## 2021-06-03 DIAGNOSIS — G4733 Obstructive sleep apnea (adult) (pediatric): Secondary | ICD-10-CM | POA: Diagnosis not present

## 2021-06-03 NOTE — Telephone Encounter (Signed)
Attempted to contact patient's wife [name] to schedule a Palliative Care consult appointment. No answer left a message to return call.

## 2021-06-03 NOTE — Telephone Encounter (Signed)
Signed and handed to [place]. Signed:  Crissie Sickles, MD           06/03/2021

## 2021-06-03 NOTE — Patient Outreach (Signed)
[facility]) Care Management  06/03/2021  [name] January 19, 1961 [id number]   Monthly telephonic UMR case management review with [facility] RNCMs and Dr Alonza Bogus. 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 Reviewed patient recent admission for [facility] Pneumonia. OSA , progressive weakness due to myotonic dystrophy.  Patient was discharged home with Home Health service of RN, PT/ST. He was also prescribed BIPAP in which patient with using on some nights , some difficulty with mask fit that wife as contact DME company regarding. Patient is requiring  assistance with meals, dependent for assist out of bed, noted increased weakness.  Patient/family agreeable to Palliative Care Consult and PCP has ordered.   Follow up:  Will continue outreach complex care management assist with care coordination needs identified.  Next outreach scheduled  in the next 2 weeks.    [name], RN, BSN  [place] Management Coordinator  [phone]- Mobile [phone]- Toll Free Main Office

## 2021-06-07 ENCOUNTER — Telehealth: Payer: Self-pay

## 2021-06-07 ENCOUNTER — Other Ambulatory Visit (HOSPITAL_COMMUNITY): Payer: Self-pay

## 2021-06-07 ENCOUNTER — Other Ambulatory Visit: Payer: Self-pay | Admitting: Family Medicine

## 2021-06-07 DIAGNOSIS — F419 Anxiety disorder, unspecified: Secondary | ICD-10-CM | POA: Diagnosis not present

## 2021-06-07 DIAGNOSIS — J690 Pneumonitis due to inhalation of food and vomit: Secondary | ICD-10-CM | POA: Diagnosis not present

## 2021-06-07 DIAGNOSIS — R1312 Dysphagia, oropharyngeal phase: Secondary | ICD-10-CM | POA: Diagnosis not present

## 2021-06-07 DIAGNOSIS — F32A Depression, unspecified: Secondary | ICD-10-CM | POA: Diagnosis not present

## 2021-06-07 DIAGNOSIS — G4733 Obstructive sleep apnea (adult) (pediatric): Secondary | ICD-10-CM | POA: Diagnosis not present

## 2021-06-07 DIAGNOSIS — J9620 Acute and chronic respiratory failure, unspecified whether with hypoxia or hypercapnia: Secondary | ICD-10-CM | POA: Diagnosis not present

## 2021-06-07 DIAGNOSIS — G7111 Myotonic muscular dystrophy: Secondary | ICD-10-CM | POA: Diagnosis not present

## 2021-06-07 DIAGNOSIS — E039 Hypothyroidism, unspecified: Secondary | ICD-10-CM | POA: Diagnosis not present

## 2021-06-07 DIAGNOSIS — G4700 Insomnia, unspecified: Secondary | ICD-10-CM | POA: Diagnosis not present

## 2021-06-07 MED ORDER — OMEPRAZOLE 40 MG PO CPDR
DELAYED_RELEASE_CAPSULE | Freq: Every day | ORAL | 2 refills | Status: DC
  Filled 2021-06-07: qty 90, 90d supply, fill #0
  Filled 2021-09-20: qty 90, 90d supply, fill #1
  Filled 2022-01-03: qty 90, 90d supply, fill #2

## 2021-06-07 MED ORDER — AMPHETAMINE-DEXTROAMPHET ER 30 MG PO CP24
ORAL_CAPSULE | Freq: Every day | ORAL | 0 refills | Status: DC
  Filled 2021-06-07: qty 30, 30d supply, fill #0

## 2021-06-07 MED ORDER — AMPHETAMINE-DEXTROAMPHET ER 10 MG PO CP24
ORAL_CAPSULE | ORAL | 0 refills | Status: DC
  Filled 2021-06-07: qty 30, 30d supply, fill #0

## 2021-06-07 NOTE — Telephone Encounter (Signed)
Requesting: Adderall Contract: 01/26/21 UDS: n/a Last Visit:05/31/21 Next Visit: advised to f/u 3 mo. Last Refill:04/16/21(30,0)  Please Advise. Meds pending

## 2021-06-07 NOTE — Telephone Encounter (Signed)
Spoke with patient's wife [name] and scheduled an in-person Palliative Consult for 07/09/21 @ 12:30PM.  COVID screening was negative. One cat in the home. Patient lives with wife and son. Son will be moving soon to [place].   Consent obtained; updated Outlook/Netsmart/Team List and Epic.   Family is aware they may be receiving a call from NP the day before or day of to confirm appointment.

## 2021-06-07 NOTE — Telephone Encounter (Signed)
Signed and put in box to go up front. Signed:  Crissie Sickles, MD           06/07/2021

## 2021-06-07 NOTE — Telephone Encounter (Signed)
Received OT orders to be signed. Placed on PCP desk for signature.

## 2021-06-07 NOTE — Telephone Encounter (Signed)
Pt's wife, [name] advised regarding refills.

## 2021-06-07 NOTE — Telephone Encounter (Signed)
Home health orders received 06/03/21 for [facility] initiation orders: No.  Home health re-certification orders: Yes. Patient last seen by ordering physician for this condition: 05/31/21. Must be less than 90 days for re-certification and less than 30 days prior for initiation. Visit must have been for the condition the orders are being placed.  Patient meets criteria for Physician to sign orders: Yes.        Current med list has been attached: Yes        Orders placed on physicians desk for signature: 06/07/21(date) If patient does not meet criteria for orders to be signed: pt was called to schedule appt. Appt is scheduled for n/a.   [place]

## 2021-06-10 DIAGNOSIS — J9620 Acute and chronic respiratory failure, unspecified whether with hypoxia or hypercapnia: Secondary | ICD-10-CM | POA: Diagnosis not present

## 2021-06-10 DIAGNOSIS — F32A Depression, unspecified: Secondary | ICD-10-CM | POA: Diagnosis not present

## 2021-06-10 DIAGNOSIS — J690 Pneumonitis due to inhalation of food and vomit: Secondary | ICD-10-CM | POA: Diagnosis not present

## 2021-06-10 DIAGNOSIS — G4700 Insomnia, unspecified: Secondary | ICD-10-CM | POA: Diagnosis not present

## 2021-06-10 DIAGNOSIS — G4733 Obstructive sleep apnea (adult) (pediatric): Secondary | ICD-10-CM | POA: Diagnosis not present

## 2021-06-10 DIAGNOSIS — G7111 Myotonic muscular dystrophy: Secondary | ICD-10-CM | POA: Diagnosis not present

## 2021-06-10 DIAGNOSIS — F419 Anxiety disorder, unspecified: Secondary | ICD-10-CM | POA: Diagnosis not present

## 2021-06-10 DIAGNOSIS — E039 Hypothyroidism, unspecified: Secondary | ICD-10-CM | POA: Diagnosis not present

## 2021-06-10 DIAGNOSIS — R1312 Dysphagia, oropharyngeal phase: Secondary | ICD-10-CM | POA: Diagnosis not present

## 2021-06-11 DIAGNOSIS — G4733 Obstructive sleep apnea (adult) (pediatric): Secondary | ICD-10-CM | POA: Diagnosis not present

## 2021-06-11 DIAGNOSIS — E039 Hypothyroidism, unspecified: Secondary | ICD-10-CM | POA: Diagnosis not present

## 2021-06-11 DIAGNOSIS — J690 Pneumonitis due to inhalation of food and vomit: Secondary | ICD-10-CM | POA: Diagnosis not present

## 2021-06-11 DIAGNOSIS — R1312 Dysphagia, oropharyngeal phase: Secondary | ICD-10-CM | POA: Diagnosis not present

## 2021-06-11 DIAGNOSIS — J9620 Acute and chronic respiratory failure, unspecified whether with hypoxia or hypercapnia: Secondary | ICD-10-CM | POA: Diagnosis not present

## 2021-06-11 DIAGNOSIS — F32A Depression, unspecified: Secondary | ICD-10-CM | POA: Diagnosis not present

## 2021-06-11 DIAGNOSIS — G4700 Insomnia, unspecified: Secondary | ICD-10-CM | POA: Diagnosis not present

## 2021-06-11 DIAGNOSIS — G7111 Myotonic muscular dystrophy: Secondary | ICD-10-CM | POA: Diagnosis not present

## 2021-06-11 DIAGNOSIS — F419 Anxiety disorder, unspecified: Secondary | ICD-10-CM | POA: Diagnosis not present

## 2021-06-14 ENCOUNTER — Other Ambulatory Visit: Payer: Self-pay | Admitting: Family Medicine

## 2021-06-14 ENCOUNTER — Other Ambulatory Visit (HOSPITAL_COMMUNITY): Payer: Self-pay

## 2021-06-14 MED ORDER — LEVOTHYROXINE SODIUM 150 MCG PO TABS
ORAL_TABLET | ORAL | 0 refills | Status: DC
  Filled 2021-06-14: qty 28, 28d supply, fill #0

## 2021-06-15 DIAGNOSIS — F32A Depression, unspecified: Secondary | ICD-10-CM | POA: Diagnosis not present

## 2021-06-15 DIAGNOSIS — J9620 Acute and chronic respiratory failure, unspecified whether with hypoxia or hypercapnia: Secondary | ICD-10-CM | POA: Diagnosis not present

## 2021-06-15 DIAGNOSIS — R1312 Dysphagia, oropharyngeal phase: Secondary | ICD-10-CM | POA: Diagnosis not present

## 2021-06-15 DIAGNOSIS — F419 Anxiety disorder, unspecified: Secondary | ICD-10-CM | POA: Diagnosis not present

## 2021-06-15 DIAGNOSIS — E039 Hypothyroidism, unspecified: Secondary | ICD-10-CM | POA: Diagnosis not present

## 2021-06-15 DIAGNOSIS — G4733 Obstructive sleep apnea (adult) (pediatric): Secondary | ICD-10-CM | POA: Diagnosis not present

## 2021-06-15 DIAGNOSIS — G4700 Insomnia, unspecified: Secondary | ICD-10-CM | POA: Diagnosis not present

## 2021-06-15 DIAGNOSIS — G7111 Myotonic muscular dystrophy: Secondary | ICD-10-CM | POA: Diagnosis not present

## 2021-06-15 DIAGNOSIS — J690 Pneumonitis due to inhalation of food and vomit: Secondary | ICD-10-CM | POA: Diagnosis not present

## 2021-06-16 ENCOUNTER — Other Ambulatory Visit: Payer: Self-pay | Admitting: *Deleted

## 2021-06-16 ENCOUNTER — Telehealth: Payer: Self-pay | Admitting: Family Medicine

## 2021-06-16 DIAGNOSIS — J9612 Chronic respiratory failure with hypercapnia: Secondary | ICD-10-CM

## 2021-06-16 DIAGNOSIS — J984 Other disorders of lung: Secondary | ICD-10-CM

## 2021-06-16 DIAGNOSIS — Z20822 Contact with and (suspected) exposure to covid-19: Secondary | ICD-10-CM

## 2021-06-16 NOTE — Patient Outreach (Signed)
[facility]) Care Management  [place]  06/16/2021   [name] 08/26/1961 [id number]  Telephone follow up   Subjective:  Successful outreach call to patient wife, [name], she discussed they are doing okay. She discussed patient son , that lives with them and assist with patient care as needed has tested positive for Covid. She has contacted PCP and orders placed for MAB infusion due patient high risk, wife has tested negative, they are taking precautions with staying safe wearing mask, son is  keeping distance .  Wife states patient still has limited time that he is wearing bipap at night, they have received new face mask to try, he is agreeable to trying some time each night wearing Bipap. She denies patient having any worsening symptoms of shortness of breath or difficulty. She reports that he is making slow progress with therapy, they are looking into getting a standing walker to try with therapy.For the past week she has been able to assist patient from up to bathroom   Objective:   Encounter Medications:  Outpatient Encounter Medications as of 06/16/2021  Medication Sig   acetaminophen (TYLENOL) 500 MG tablet Take 1,000 mg by mouth at bedtime.   amphetamine-dextroamphetamine (ADDERALL XR) 10 MG 24 hr capsule TAKE 1 CAPSULE BY MOUTH ONCE DAILY (TO BE TAKEN WITH 30 MG XR CAPS)   amphetamine-dextroamphetamine (ADDERALL XR) 30 MG 24 hr capsule TAKE 1 CAPSULE BY MOUTH ONCE DAILY   clotrimazole-betamethasone (LOTRISONE) cream APPLY TO THE AFFECTED AREA(S) TWICE DAILY AS NEEDED (Patient taking differently: Apply 1 application topically 2 (two) times daily as needed (to affected areas).)   FLUoxetine (PROZAC) 40 MG capsule TAKE 1 CAPSULE BY MOUTH ONCE DAILY (Patient taking differently: Take 40 mg by mouth daily.)   glycopyrrolate (ROBINUL) 2 MG tablet TAKE 1/2 TO 1 TABLET BY MOUTH TWO TIMES DAILY AS NEEDED FOR EXCESSIVE ORAL SECRETIONS (Patient taking  differently: Take 1-2 mg by mouth See admin instructions. Take 2 mg by mouth at bedtime and an additional 1-2 mg once a day as needed FOR EXCESSIVE ORAL SECRETIONS)   levothyroxine (SYNTHROID) 150 MCG tablet TAKE 1 TABLET BY MOUTH ONCE DAILY EXCEPT FOR SUNDAY SKIP DOSE   Melatonin 10 MG CAPS Take 10 mg by mouth at bedtime.   omeprazole (PRILOSEC) 40 MG capsule TAKE 1 CAPSULE BY MOUTH ONCE DAILY   ondansetron (ZOFRAN) 8 MG tablet Take 1 tablet (8 mg total) by mouth every 8 (eight) hours as needed for nausea or vomiting.   Simethicone 125 MG TABS Take 125 mg by mouth every 6 (six) hours as needed for flatulence.   traZODone (DESYREL) 100 MG tablet Take 2 tablets by mouth daily at bedtime. (Patient taking differently: Take 200 mg by mouth at bedtime.)   No facility-administered encounter medications on file as of 06/16/2021.    Functional Status:  In your present state of health, do you have any difficulty performing the following activities: 05/12/2021 12/07/2020  Hearing? N N  Vision? N N  Difficulty concentrating or making decisions? N N  Walking or climbing stairs? Y Y  Comment - Patient has Myotonic Muscular Dystrophy - wife and son assist  Dressing or bathing? Y Y  Comment - Patient has Myotonic Muscular Dystrophy - wife and son assist  Doing errands, shopping? Y Y  Comment - Patient has Myotonic Muscular Dystrophy - wife and son assist  Preparing Food and eating ? - Y  Comment - Patient has Myotonic Muscular Dystrophy -  wife and son assist  Using the Toilet? - Y  Comment - Patient has Myotonic Muscular Dystrophy - wife and son assist  In the past six months, have you accidently leaked urine? - Y  Comment - Patient has Myotonic Muscular Dystrophy - wife and son assist  Do you have problems with loss of bowel control? - N  Managing your Medications? - Y  Comment - Patient has Myotonic Muscular Dystrophy - wife and son assist  Managing your Finances? - Y  Comment - Patient has Myotonic  Muscular Dystrophy - wife and son assist  Housekeeping or managing your Housekeeping? - Y  Comment - Patient has Myotonic Muscular Dystrophy - wife and son assist  Some recent data might be hidden    Fall/Depression Screening: Fall Risk  12/07/2020  Falls in the past year? 1  Number falls in past yr: 1  Injury with Fall? 1  Risk for fall due to : History of fall(s);Impaired balance/gait;Impaired mobility  Follow up Education provided;Falls prevention discussed   PHQ 2/9 Scores 03/29/2021 12/07/2020 08/02/2019 02/12/2019 07/17/2018 04/11/2018 01/10/2018  PHQ - 2 Score 5 4 6 2 3 3 5   PHQ- 9 Score 21 7 19 12 13 16 17     Assessment:   Care Plan Care Plan : General Plan of Care (Adult)  Updates made by Alfonzo Feller, RN since 06/16/2021 12:00 AM     Problem: Quality of Life (General Plan of Care)   Priority: Medium  Onset Date: 12/01/2020     Long-Range Goal: Quality of Life Maintained as evidenced by  patient/family establishing a  palliative care plan over the next 60 days.   Start Date: 12/01/2020  Expected End Date: 07/24/2021  Priority: High  Note:   Evidence-based guidance:  Assess patient's thoughts about quality of life, goals and expectations, and dissatisfaction or desire to improve.   Assess and monitor for signs/symptoms of psychosocial concerns, especially depression or ideations harm to others or self; provide or refer for mental health services as needed.  I Encourage patient to tap into hope to improve sense of self.  Counsel based on prognosis and as early as possible about end-of-life and palliative care; consider referral to palliative care provider.  Advocate for the development of palliative care plan that may include avoidance of unnecessary testing and intervention, symptom control, discontinuation of medications, hospice and organ donation.  Counsel as early as possible those with life-limiting chronic disease about palliative care; consider referral to  palliative care provider.  Advocate for the development of palliative care plan.   Notes: Discussion with PCP regarding palliative care, orders placed      Task: Support and Maintain Acceptable Degree of Health, Comfort and Happiness   Due Date: 07/24/2021  Priority: Routine  Note:   Care Management Activities:    - sleep hygiene techniques encouraged  Reinforced wearing BIPAP   Notes: DME company has been consult has seen 2 additional mask to try     Problem: Health Promotion or Disease Self-Management (General Plan of Care)   Priority: High  Onset Date: 12/01/2020     Goal: [place] as evidenced by report if no readmission for Pneumonia over the next 30 days.   Start Date: 12/01/2020  Expected End Date: 07/01/2021  This Visit's Progress: On track  Priority: High  Note:   Evidence-based guidance:  Review biopsychosocial determinants of health screens.    Support patient and family/caregiver active participation in decision-making and self-management plan.  Implement  additional goals and interventions based on identified risk factors to reduce health risk.  Facilitate advance care planning.   Notes:Discussed current signs symptoms of pneumonia or worsening respiratory symptoms, none noted. Reinforced encouragement of benefit of wearing bipap. Reinforced education on keeping safe with son in home positive for covid , Patient to receive MAB infusion.       Goals Addressed             This Visit's Progress    Find Help in My Community       Timeframe:  Short-Term Goal Priority:  High Start Date:   12/01/20                          Expected End Date:  07/24/21                Follow Up Date 07/14/21   - follow-up on any referrals for help I am given    Why is this important?   Knowing how and where to find help for yourself or family in your neighborhood and community is an important skill.  You will want to take some steps to learn how.     Notes:  Home health therapy services in place encouraging participation       [place] My Quality of Life       Timeframe:  Long-Range Goal Priority:  Medium Start Date:     12/01/20                        Expected End Date:   07/24/21               Follow Up Date 07/14/21   - discuss my treatment options with the doctor or nurse Reviewed palliative care services for support with navigating chronic condition    Why is this important?   Having a long-term illness can be scary.  It can also be stressful for you and your caregiver.  These steps may help.    Notes:Palliative care home visit date set          Plan:  Follow-up: Patient agrees to Care Plan and Follow-up. Will plan follow up in the next week   [name], RN, BSN  [place] Management Coordinator  [phone]- Mobile [phone]- [place]

## 2021-06-16 NOTE — Telephone Encounter (Signed)
Spoke with patient's wife regarding results/recommendations.  

## 2021-06-16 NOTE — Telephone Encounter (Signed)
Pt's son tested positive for covid today. Pt is not currently with any symptoms. 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

## 2021-06-18 ENCOUNTER — Other Ambulatory Visit (HOSPITAL_COMMUNITY): Payer: Self-pay

## 2021-06-18 MED ORDER — COVID-19 AT HOME ANTIGEN TEST VI KIT
PACK | 0 refills | Status: DC
  Filled 2021-06-18: qty 4, 4d supply, fill #0

## 2021-06-18 NOTE — Telephone Encounter (Signed)
[name], wife (DPR) called regarding infusion. Patient rec'd mychart from [facility] stating that he did not meet criteria.    Please advise.  [name] can be reached at [phone]

## 2021-06-18 NOTE — Telephone Encounter (Signed)
Spoke with pt's wife, [name] regarding recommendations.

## 2021-06-18 NOTE — Telephone Encounter (Signed)
Please advise 

## 2021-06-18 NOTE — Telephone Encounter (Signed)
Monitor for symptoms, check covid test at least once over the next 1 week. I'm trying to find out from the infusion clinic why he did not qualify--thx

## 2021-06-18 NOTE — Telephone Encounter (Signed)
[place] at [place]

## 2021-06-18 NOTE — Telephone Encounter (Signed)
Can you get me the phone # for the infusion center? I just want to find out why he doesn't qualify.-thx

## 2021-06-19 DIAGNOSIS — G7111 Myotonic muscular dystrophy: Secondary | ICD-10-CM | POA: Diagnosis not present

## 2021-06-19 DIAGNOSIS — J9612 Chronic respiratory failure with hypercapnia: Secondary | ICD-10-CM | POA: Diagnosis not present

## 2021-06-19 DIAGNOSIS — I509 Heart failure, unspecified: Secondary | ICD-10-CM | POA: Diagnosis not present

## 2021-06-19 DIAGNOSIS — J9611 Chronic respiratory failure with hypoxia: Secondary | ICD-10-CM | POA: Diagnosis not present

## 2021-06-29 DIAGNOSIS — G7111 Myotonic muscular dystrophy: Secondary | ICD-10-CM | POA: Diagnosis not present

## 2021-06-29 DIAGNOSIS — F32A Depression, unspecified: Secondary | ICD-10-CM | POA: Diagnosis not present

## 2021-06-29 DIAGNOSIS — E039 Hypothyroidism, unspecified: Secondary | ICD-10-CM | POA: Diagnosis not present

## 2021-06-29 DIAGNOSIS — G4733 Obstructive sleep apnea (adult) (pediatric): Secondary | ICD-10-CM | POA: Diagnosis not present

## 2021-06-29 DIAGNOSIS — G4700 Insomnia, unspecified: Secondary | ICD-10-CM | POA: Diagnosis not present

## 2021-06-29 DIAGNOSIS — F419 Anxiety disorder, unspecified: Secondary | ICD-10-CM | POA: Diagnosis not present

## 2021-06-29 DIAGNOSIS — J9620 Acute and chronic respiratory failure, unspecified whether with hypoxia or hypercapnia: Secondary | ICD-10-CM | POA: Diagnosis not present

## 2021-06-29 DIAGNOSIS — R1312 Dysphagia, oropharyngeal phase: Secondary | ICD-10-CM | POA: Diagnosis not present

## 2021-06-29 DIAGNOSIS — J690 Pneumonitis due to inhalation of food and vomit: Secondary | ICD-10-CM | POA: Diagnosis not present

## 2021-06-30 DIAGNOSIS — E039 Hypothyroidism, unspecified: Secondary | ICD-10-CM | POA: Diagnosis not present

## 2021-06-30 DIAGNOSIS — R1312 Dysphagia, oropharyngeal phase: Secondary | ICD-10-CM | POA: Diagnosis not present

## 2021-06-30 DIAGNOSIS — F32A Depression, unspecified: Secondary | ICD-10-CM | POA: Diagnosis not present

## 2021-06-30 DIAGNOSIS — G7111 Myotonic muscular dystrophy: Secondary | ICD-10-CM | POA: Diagnosis not present

## 2021-06-30 DIAGNOSIS — J9620 Acute and chronic respiratory failure, unspecified whether with hypoxia or hypercapnia: Secondary | ICD-10-CM | POA: Diagnosis not present

## 2021-06-30 DIAGNOSIS — F419 Anxiety disorder, unspecified: Secondary | ICD-10-CM | POA: Diagnosis not present

## 2021-06-30 DIAGNOSIS — G4700 Insomnia, unspecified: Secondary | ICD-10-CM | POA: Diagnosis not present

## 2021-06-30 DIAGNOSIS — J690 Pneumonitis due to inhalation of food and vomit: Secondary | ICD-10-CM | POA: Diagnosis not present

## 2021-06-30 DIAGNOSIS — G4733 Obstructive sleep apnea (adult) (pediatric): Secondary | ICD-10-CM | POA: Diagnosis not present

## 2021-07-06 ENCOUNTER — Telehealth: Payer: Self-pay | Admitting: Family Medicine

## 2021-07-06 ENCOUNTER — Other Ambulatory Visit (HOSPITAL_COMMUNITY): Payer: Self-pay

## 2021-07-06 MED FILL — Fluoxetine HCl Cap 40 MG: ORAL | 90 days supply | Qty: 90 | Fill #0 | Status: AC

## 2021-07-06 NOTE — Telephone Encounter (Signed)
Pls notify wife that I checked with the infusion clinic and there is currently no post-exposure covid prophylaxis indicated-->studies have found them to not be effective against omicron variant.

## 2021-07-06 NOTE — Telephone Encounter (Signed)
Hi, hope all is well. I am reaching out to see if there is any infusion treatment approved (and available) for covid post-exposure prophylaxis at this time. This pt has many high risk criteria and now another of his family members has covid (wife->his primary caregiver).  He has no sx's. In my research on the question there is no clear guidance on this issue. Just want to make sure. Thank you,  Phil.

## 2021-07-06 NOTE — Telephone Encounter (Signed)
OK, thank you for such a prompt response!

## 2021-07-06 NOTE — Telephone Encounter (Signed)
Spoke with pt's wife, [name] advised of update. She will test pt tomorrow and notify us if anything changes.

## 2021-07-07 ENCOUNTER — Other Ambulatory Visit (HOSPITAL_COMMUNITY): Payer: Self-pay

## 2021-07-07 ENCOUNTER — Other Ambulatory Visit: Payer: Self-pay | Admitting: Family Medicine

## 2021-07-07 MED ORDER — NIRMATRELVIR/RITONAVIR (PAXLOVID)TABLET
3.0000 | ORAL_TABLET | Freq: Two times a day (BID) | ORAL | 0 refills | Status: AC
  Filled 2021-07-07: qty 30, 5d supply, fill #0

## 2021-07-07 NOTE — Telephone Encounter (Signed)
LM for pt to return call regarding rx.  

## 2021-07-07 NOTE — Telephone Encounter (Signed)
Patient wife (DPR) called to say patient is now COVID positive.  He is SOB and is burning up, no cough, some eye drainage.   Please call wife [name] asap

## 2021-07-07 NOTE — Telephone Encounter (Signed)
Yes. Paxlovid eRx'd.

## 2021-07-07 NOTE — Telephone Encounter (Signed)
Wife stated husband will not comply with going to ED, can he get antiviral med?

## 2021-07-08 NOTE — Telephone Encounter (Signed)
Pt stated the only way he would go to the ED would be to go to [facility] but he still declined. Advised by pharmacist to cut his trazodone in 1/2 and has been able to start antiviral med.

## 2021-07-09 ENCOUNTER — Other Ambulatory Visit: Payer: Self-pay

## 2021-07-09 ENCOUNTER — Other Ambulatory Visit: Payer: 59 | Admitting: Nurse Practitioner

## 2021-07-13 ENCOUNTER — Other Ambulatory Visit: Payer: Self-pay

## 2021-07-13 ENCOUNTER — Other Ambulatory Visit: Payer: 59 | Admitting: Nurse Practitioner

## 2021-07-13 DIAGNOSIS — G7111 Myotonic muscular dystrophy: Secondary | ICD-10-CM | POA: Diagnosis not present

## 2021-07-13 DIAGNOSIS — Z515 Encounter for palliative care: Secondary | ICD-10-CM

## 2021-07-13 NOTE — Addendum Note (Signed)
Addended by: Alba Destine on: 07/13/2021 03:14 PM   Modules accepted: Level of Service

## 2021-07-13 NOTE — Progress Notes (Signed)
Therapist, nutritional Palliative Care Consult Note Telephone: [phone]  Fax: [phone]   Date of encounter: 07/13/21 PATIENT NAME: [name] [address] [place] [address]   [phone] (home)  DOB: September 28, 1961 MRN: [id number]  PRIMARY CARE PROVIDER:    Jeoffrey Massed, MD,  [address] [place] [address] [phone]  REFERRING PROVIDER:   Jeoffrey Massed, MD [address] [place],  [place] [address] [phone]  RESPONSIBLE PARTY:    Contact Information     Name Relation Home Work Mobile   [name] Spouse [phone]  [phone]   [name], [name]   [phone]     Due to patient's current COVID-19 infection, the patient and family prefer, and have given their verbal consent for, a provider visit via telehealth from my office. HIPPA policies of confidentially were discussed.  Palliative Care was asked to follow this patient by consultation request of  [name], Maryjean Morn, MD to address advance care planning and complex medical decision making. This is the initial visit.                                    ASSESSMENT AND PLAN / RECOMMENDATIONS:   Advance Care Planning/Goals of Care: Goals include to maximize quality of life and symptom management.  ACP discussion held with patient, his wife and their son [name].  Our advance care planning conversation included a discussion about:    The value and importance of advance care planning  Experiences with loved ones who have been seriously ill or have died  Exploration of personal, cultural or spiritual beliefs that might influence medical decisions  Exploration of goals of care in the event of a sudden injury or illness  CODE STATUS: Full Code Goal of care: Goal of care is comfort. Patient and family verbalized awareness that patient has a progressive and terminal disease and does not believe he could be any better than he is now. Does not want anything  done to him that will not provide comfort. Directives: Patient undecided about his decision for his code status. He at this time want full resuscitation attempt including mechanical ventilation in the event of cardiac or respiratory arrest. He want to have further discussion with his family before he makes his further decision. Validation provided. Palliative care will continue to provide support to patient, family and medical team.  I spent 20 minutes providing this consultation. More than 50% of the time in this consultation was spent in counseling and care coordination. -------------------------------------------------------------------------------------------  Symptom Management/Plan: Cough: recommend over the counter Mucinex DM as needed. Encourage elevation of bed to about 30% when sleeping. Notify PCP if worsening cough, or if colored secretions, fever or chills. Dysphagia: condition is related to progression muscular dystrophy. Patient on Dysphagia 3 diet. Discussed safe swallowing practices to include sitting upright during meals, eating small bites, and chewing food thoroughly. Patient and wife verbalized understanding. Dysarthria: Dysarthria related to disease progression. Recommend supportive care to include providing ampule time for communication. Chronic respiratory failure: Condition related to progression of myotonic dystrophy. Patient not consistent with use of BIPAP due to intolerance of the equipment. Wife report patient has not used the BIPAP in the last one week. Wife report patient demonstrates decrease in function when not used. Discussed need for consistent use of BIPAP and importance of adequate ventilation in muscle health. Patient verbalized understanding. Patient  verbalized plan to use equipment more often. Provided general support and encouragement. Questions and concerns were addressed. Patient and his family was encouraged to call with questions and/or concerns.  Follow up  Palliative Care Visit: Palliative care will continue to follow for complex medical decision making, advance care planning, and clarification of goals. Return in about 4  weeks or prn.  PPS: 40% weak  HOSPICE ELIGIBILITY/DIAGNOSIS: TBD  Chief Complaint: non-productive Cough  History obtained from review of Epic EMR and discussion with [name] and his family.  HISTORY OF PRESENT ILLNESS:  [name] is a 60 y.o. year old male with medical problem significant for myotonic dystrophy (dx 1995), Dysarthria, OSA, hypothyroidism, dysphagia, anxiety/depression. Patient was diagnosed with COVID 19 infection in the last week and on antiviral medication, family report symptom is improving. Patient however report lingering congested by non productive cough. Report cough is worse at night and impedes his sleep. Patient has used Delsium cough med without significant improvement. No report of fever, chills, or increased SOB. No report of chest pain or uncontrolled pain.  Myotonic dystrophy is progressive and symptoms worsening. Patient has several family members who suffers from this disease. Patient totally dependent on his wife for all his ADLs. He is however able to feed himself finger foods. He is incontinent of bowel and bladder, uses wheelchair for ambulation. He has a Hospital bed and uses Hoyer lift for transfers. His wife is his sole caregiver. She still works full time and works from home.   I reviewed available labs, medications, imaging, studies and related documents from the EMR.  Records reviewed and summarized above.   ROS  All 10 point systems reviewed and are negative except as documented in history of present illness above.  Physical Exam: Unable to complete due to tele-health visit  CURRENT PROBLEM LIST:  Patient Active Problem List   Diagnosis Date Noted   Oropharyngeal dysphagia    Aspiration into airway 05/12/2021   Dyspnea    OSA (obstructive sleep apnea) 09/06/2019    Myotonic dystrophy ([facility])    Abnormal chest x-ray    Abnormal CT scan of lung    Debility    Leukopenia    Encephalopathy 18/56/3149   Acute metabolic encephalopathy 70/26/3785   Pulmonary nodule 10/31/2018   AKI (acute kidney injury) ([facility]) 10/31/2018   Generalized weakness 10/31/2018   Fall 10/31/2018   Weakness    Aspiration pneumonia ([facility]) 05/09/2017   History of fall 05/09/2017   Viral gastroenteritis 03/13/2014   Autonomic dysfunction 09/05/2013   Tachycardia 02/20/2013   ADD (attention deficit disorder) 08/15/2012   Depression 08/15/2012   Carcinoma of thyroid gland ([facility]) 88/50/2774   Nonalcoholic fatty liver disease 01/23/2012   History of thyroid cancer    Dizziness, nonspecific 12/07/2011   Hypothyroidism 12/07/2011   DEPRESSION 03/20/2008   Myotonic muscular dystrophy ([facility]) 03/20/2008   GERD 03/20/2008   PAST MEDICAL HISTORY:  Active Ambulatory Problems    Diagnosis Date Noted   DEPRESSION 03/20/2008   Myotonic muscular dystrophy ([facility]) 03/20/2008   GERD 03/20/2008   Dizziness, nonspecific 12/07/2011   Hypothyroidism 12/07/2011   History of thyroid cancer    ADD (attention deficit disorder) 08/15/2012   Depression 08/15/2012   Tachycardia 02/20/2013   Autonomic dysfunction 09/05/2013   Viral gastroenteritis 03/13/2014   Aspiration pneumonia ([facility]) 05/09/2017   History of fall 12/87/8676   Acute metabolic encephalopathy 72/08/4708   Pulmonary nodule 10/31/2018   AKI (acute kidney injury) ([facility]) 10/31/2018   Generalized  weakness 10/31/2018   Fall 10/31/2018   Weakness    Encephalopathy 11/02/2018   Abnormal CT scan of lung    Debility    Leukopenia    Abnormal chest x-ray    Myotonic dystrophy ([facility])    Carcinoma of thyroid gland ([facility]) 38/45/3646   Nonalcoholic fatty liver disease 01/23/2012   OSA (obstructive sleep apnea) 09/06/2019   Aspiration into airway 05/12/2021   Dyspnea    Oropharyngeal dysphagia    Resolved Ambulatory Problems    Diagnosis  Date Noted   SLEEP APNEA 03/20/2008   Past Medical History:  Diagnosis Date   Adult ADHD    Aspiration pneumonitis ([facility]) 04/2017, 08/2017   Chronic respiratory failure ([facility])    Constipation, chronic    Dizziness 06/2011+   Elevated transaminase level    Erectile dysfunction    Esophageal dysmotility    Facial fracture ([facility]) 06/2011   Fatty liver disease, nonalcoholic    GERD (gastroesophageal reflux disease)    Hyperlipidemia    LVH (left ventricular hypertrophy)    Nephrolithiasis    Obesity, Class I, BMI 30-34.9    Pulmonary nodule, right 05/2017   SOCIAL HX:  Social History   Tobacco Use   Smoking status: Never   Smokeless tobacco: Never  Substance Use Topics   Alcohol use: Yes    Comment: rare beer   FAMILY HX:  Family History  Problem Relation Age of Onset   Arthritis Mother    Hypertension Mother    Alcohol abuse Father    Arthritis Father    Hypertension Father    Colon cancer Father    Prostate cancer Father    Heart disease Brother      ALLERGIES: No Known Allergies   PERTINENT MEDICATIONS:  Outpatient Encounter Medications as of 07/13/2021  Medication Sig   acetaminophen (TYLENOL) 500 MG tablet Take 1,000 mg by mouth at bedtime.   amphetamine-dextroamphetamine (ADDERALL XR) 10 MG 24 hr capsule TAKE 1 CAPSULE BY MOUTH ONCE DAILY (TO BE TAKEN WITH 30 MG XR CAPS)   amphetamine-dextroamphetamine (ADDERALL XR) 30 MG 24 hr capsule TAKE 1 CAPSULE BY MOUTH ONCE DAILY   clotrimazole-betamethasone (LOTRISONE) cream APPLY TO THE AFFECTED AREA(S) TWICE DAILY AS NEEDED (Patient taking differently: Apply 1 application topically 2 (two) times daily as needed (to affected areas).)   COVID-19 At Home Antigen Test KIT Use as directed in package instructions   FLUoxetine (PROZAC) 40 MG capsule TAKE 1 CAPSULE BY MOUTH ONCE DAILY (Patient taking differently: Take 40 mg by mouth daily.)   glycopyrrolate (ROBINUL) 2 MG tablet TAKE 1/2 TO 1 TABLET BY MOUTH TWO TIMES DAILY AS NEEDED  FOR EXCESSIVE ORAL SECRETIONS (Patient taking differently: Take 1-2 mg by mouth See admin instructions. Take 2 mg by mouth at bedtime and an additional 1-2 mg once a day as needed FOR EXCESSIVE ORAL SECRETIONS)   levothyroxine (SYNTHROID) 150 MCG tablet TAKE 1 TABLET BY MOUTH ONCE DAILY EXCEPT FOR SUNDAY SKIP DOSE   Melatonin 10 MG CAPS Take 10 mg by mouth at bedtime.   omeprazole (PRILOSEC) 40 MG capsule TAKE 1 CAPSULE BY MOUTH ONCE DAILY   ondansetron (ZOFRAN) 8 MG tablet Take 1 tablet (8 mg total) by mouth every 8 (eight) hours as needed for nausea or vomiting.   Simethicone 125 MG TABS Take 125 mg by mouth every 6 (six) hours as needed for flatulence.   traZODone (DESYREL) 100 MG tablet Take 2 tablets by mouth daily at bedtime. (Patient taking differently: Take  200 mg by mouth at bedtime.)   No facility-administered encounter medications on file as of 07/13/2021.   Thank you for the opportunity to participate in the care of [name].  The palliative care team will continue to follow. Please call our office at [phone] if we can be of additional assistance.   Jari Favre, DNP, AGPCNP-BC

## 2021-07-16 ENCOUNTER — Other Ambulatory Visit (HOSPITAL_COMMUNITY): Payer: Self-pay

## 2021-07-16 ENCOUNTER — Other Ambulatory Visit: Payer: Self-pay | Admitting: Family Medicine

## 2021-07-16 ENCOUNTER — Telehealth: Payer: Self-pay | Admitting: Family Medicine

## 2021-07-16 MED ORDER — LEVOTHYROXINE SODIUM 150 MCG PO TABS
ORAL_TABLET | ORAL | 0 refills | Status: DC
Start: 2021-07-16 — End: 2021-08-24
  Filled 2021-07-16: qty 28, 30d supply, fill #0

## 2021-07-16 NOTE — Telephone Encounter (Signed)
Requesting: Adderall Contract: 01/26/21 UDS: n/a Last Visit:05/31/21 Next Visit: advised to f/u 3 mo. Last Refill:04/16/21(30,0)   Please Advise. Meds pending

## 2021-07-16 NOTE — Telephone Encounter (Signed)
RX sent. Must keep upcoming appt

## 2021-07-16 NOTE — Telephone Encounter (Signed)
Patient's wife scheduled his followup appt 08/31/21. Also requesting refill of levothyroxine. Please send to same pharmacy.

## 2021-07-19 ENCOUNTER — Other Ambulatory Visit (HOSPITAL_COMMUNITY): Payer: Self-pay

## 2021-07-19 DIAGNOSIS — J9611 Chronic respiratory failure with hypoxia: Secondary | ICD-10-CM | POA: Diagnosis not present

## 2021-07-19 DIAGNOSIS — G7111 Myotonic muscular dystrophy: Secondary | ICD-10-CM | POA: Diagnosis not present

## 2021-07-19 DIAGNOSIS — I509 Heart failure, unspecified: Secondary | ICD-10-CM | POA: Diagnosis not present

## 2021-07-19 DIAGNOSIS — J9612 Chronic respiratory failure with hypercapnia: Secondary | ICD-10-CM | POA: Diagnosis not present

## 2021-07-19 MED ORDER — AMPHETAMINE-DEXTROAMPHET ER 10 MG PO CP24
ORAL_CAPSULE | ORAL | 0 refills | Status: DC
  Filled 2021-07-19: qty 30, 30d supply, fill #0

## 2021-07-19 MED ORDER — AMPHETAMINE-DEXTROAMPHET ER 30 MG PO CP24
ORAL_CAPSULE | Freq: Every day | ORAL | 0 refills | Status: DC
  Filled 2021-07-19: qty 30, 30d supply, fill #0

## 2021-07-21 ENCOUNTER — Other Ambulatory Visit: Payer: Self-pay | Admitting: *Deleted

## 2021-07-21 DIAGNOSIS — J690 Pneumonitis due to inhalation of food and vomit: Secondary | ICD-10-CM | POA: Diagnosis not present

## 2021-07-21 DIAGNOSIS — R1312 Dysphagia, oropharyngeal phase: Secondary | ICD-10-CM | POA: Diagnosis not present

## 2021-07-21 DIAGNOSIS — G7111 Myotonic muscular dystrophy: Secondary | ICD-10-CM | POA: Diagnosis not present

## 2021-07-21 DIAGNOSIS — G4733 Obstructive sleep apnea (adult) (pediatric): Secondary | ICD-10-CM | POA: Diagnosis not present

## 2021-07-21 DIAGNOSIS — F419 Anxiety disorder, unspecified: Secondary | ICD-10-CM | POA: Diagnosis not present

## 2021-07-21 DIAGNOSIS — F32A Depression, unspecified: Secondary | ICD-10-CM | POA: Diagnosis not present

## 2021-07-21 DIAGNOSIS — E039 Hypothyroidism, unspecified: Secondary | ICD-10-CM | POA: Diagnosis not present

## 2021-07-21 DIAGNOSIS — G4700 Insomnia, unspecified: Secondary | ICD-10-CM | POA: Diagnosis not present

## 2021-07-21 DIAGNOSIS — J9620 Acute and chronic respiratory failure, unspecified whether with hypoxia or hypercapnia: Secondary | ICD-10-CM | POA: Diagnosis not present

## 2021-07-21 NOTE — Patient Outreach (Signed)
[facility]) Care Management  07/21/2021  [name] 05/13/1961 [id number]   Telephone Assessment    Subjective : Successful outreach call to wife [name] , she discussed patient recently being positive for Covid 19 and receiving paxlovid and completing.  She discussed patient having a choking episode on yesterday while eating, denies fever increased shortness of breath, she reports that he has usual cough. Discussed benefit of notifying provider of worsening symptoms, she then recalls patient recently declining to return to hospital with positive covid and shortness of breath.   Patient is awaiting physical therapy visit for evaluation on this afternoon, has had limited visit due to his recent covid and wife states that he is probably weaker than therapist last visit, unsure if they will extend visits.  Patient had initial telephonic Palliative care visit and planned home visit in the next month, continues full code.     Goals Addressed             This Visit's Progress    Find Help in My Community   On track    Timeframe:  Short-Term Goal Priority:  High Start Date:   12/01/20                          Expected End Date:  08/24/21               Follow Up Date 08/18/21   - follow-up on any referrals for help I am given    Why is this important?   Knowing how and where to find help for yourself or family in your neighborhood and community is an important skill.  You will want to take some steps to learn how.    Notes:  7/27, Discussed benefit of having caregiver support in home, wife verifies having information previously sent to review if patient agreeable to in home personal care support  Home health therapy services in place encouraging participation      [place] My Quality of Life   On track    Timeframe:  Long-Range Goal Priority:  Medium Start Date:     12/01/20                        Expected End Date:   9/31/22             Follow Up Date  08/18/21   - discuss my treatment options with the doctor or nurse Reviewed palliative care services for support with navigating chronic condition    Why is this important?   Having a long-term illness can be scary.  It can also be stressful for you and your caregiver.  These steps may help.    Notes: 7/27 Initial palliative care visit telephonic as patient had Covid during time of scheduled visit. Planned next visit in person. Wife supporting patient wishes as he continues to make health decisions, choosing not to wear bipap at this time, at times declining to return to hospital . Again reviewed worsening symptoms of pneumonia or aspiration to notify provider of evaluation/recommendations.  Palliative care home visit date set          Plan Patient wife agreeable with follow up plan in the next month. Encouraged contacting sooner if new questions concerns   [name], RN, BSN  [facility] Management Coordinator  [phone]- Mobile [phone]- [place]

## 2021-07-22 ENCOUNTER — Other Ambulatory Visit (HOSPITAL_COMMUNITY): Payer: Self-pay

## 2021-07-23 DIAGNOSIS — F32A Depression, unspecified: Secondary | ICD-10-CM | POA: Diagnosis not present

## 2021-07-23 DIAGNOSIS — R1312 Dysphagia, oropharyngeal phase: Secondary | ICD-10-CM | POA: Diagnosis not present

## 2021-07-23 DIAGNOSIS — J690 Pneumonitis due to inhalation of food and vomit: Secondary | ICD-10-CM | POA: Diagnosis not present

## 2021-07-23 DIAGNOSIS — E039 Hypothyroidism, unspecified: Secondary | ICD-10-CM | POA: Diagnosis not present

## 2021-07-23 DIAGNOSIS — J9620 Acute and chronic respiratory failure, unspecified whether with hypoxia or hypercapnia: Secondary | ICD-10-CM | POA: Diagnosis not present

## 2021-07-23 DIAGNOSIS — F419 Anxiety disorder, unspecified: Secondary | ICD-10-CM | POA: Diagnosis not present

## 2021-07-23 DIAGNOSIS — G4700 Insomnia, unspecified: Secondary | ICD-10-CM | POA: Diagnosis not present

## 2021-07-23 DIAGNOSIS — G7111 Myotonic muscular dystrophy: Secondary | ICD-10-CM | POA: Diagnosis not present

## 2021-07-23 DIAGNOSIS — G4733 Obstructive sleep apnea (adult) (pediatric): Secondary | ICD-10-CM | POA: Diagnosis not present

## 2021-07-27 ENCOUNTER — Telehealth: Payer: Self-pay

## 2021-07-27 ENCOUNTER — Other Ambulatory Visit (HOSPITAL_COMMUNITY): Payer: Self-pay

## 2021-07-27 ENCOUNTER — Encounter: Payer: Self-pay | Admitting: Family Medicine

## 2021-07-27 ENCOUNTER — Telehealth (INDEPENDENT_AMBULATORY_CARE_PROVIDER_SITE_OTHER): Payer: 59 | Admitting: Family Medicine

## 2021-07-27 VITALS — BP 106/59 | HR 88

## 2021-07-27 DIAGNOSIS — R059 Cough, unspecified: Secondary | ICD-10-CM

## 2021-07-27 DIAGNOSIS — Z8616 Personal history of COVID-19: Secondary | ICD-10-CM

## 2021-07-27 DIAGNOSIS — F331 Major depressive disorder, recurrent, moderate: Secondary | ICD-10-CM

## 2021-07-27 DIAGNOSIS — T17908D Unspecified foreign body in respiratory tract, part unspecified causing other injury, subsequent encounter: Secondary | ICD-10-CM

## 2021-07-27 DIAGNOSIS — R1310 Dysphagia, unspecified: Secondary | ICD-10-CM

## 2021-07-27 DIAGNOSIS — R1110 Vomiting, unspecified: Secondary | ICD-10-CM

## 2021-07-27 DIAGNOSIS — G7111 Myotonic muscular dystrophy: Secondary | ICD-10-CM | POA: Diagnosis not present

## 2021-07-27 MED ORDER — HYDROCODONE BIT-HOMATROP MBR 5-1.5 MG/5ML PO SOLN
5.0000 mL | Freq: Four times a day (QID) | ORAL | 0 refills | Status: DC | PRN
Start: 2021-07-27 — End: 2021-08-31
  Filled 2021-07-27: qty 120, 6d supply, fill #0

## 2021-07-27 MED ORDER — FLUOXETINE HCL 20 MG PO TABS
60.0000 mg | ORAL_TABLET | Freq: Every day | ORAL | 6 refills | Status: DC
  Filled 2021-07-27: qty 90, 30d supply, fill #0
  Filled 2021-10-20: qty 90, 30d supply, fill #1
  Filled 2021-12-02: qty 90, 30d supply, fill #2
  Filled 2022-01-03: qty 90, 30d supply, fill #3
  Filled 2022-02-07: qty 90, 30d supply, fill #4
  Filled 2022-03-15: qty 90, 30d supply, fill #5
  Filled 2022-05-03: qty 90, 30d supply, fill #6

## 2021-07-27 MED ORDER — AZITHROMYCIN 250 MG PO TABS
ORAL_TABLET | ORAL | 0 refills | Status: DC
Start: 2021-07-27 — End: 2021-08-31
  Filled 2021-07-27: qty 6, 5d supply, fill #0

## 2021-07-27 NOTE — Telephone Encounter (Signed)
Spoke with [name] at [facility], advised pt was discharged on 7/29. Transferred to [facility], advised they would not be able to assist with this process but [employer] was a recommended DME company. Called [name] to provide update for DME, if unable to provide thru [employer] we will notify her.  For [name] pls see if his [facility] agency ([facility], used to be called [facility]) can provide him with suction machine with yankauer suction device. Dx swallowing dysfunction and aspiration

## 2021-07-27 NOTE — Progress Notes (Signed)
Virtual Visit via Video Note  I connected with [name] on 07/27/21 at 11:30 AM EDT by a video enabled telemedicine application and verified that I am speaking with the correct person using two identifiers.  Location patient: home, [place] Location provider:work or home office Persons participating in the virtual visit: patient, provider  I discussed the limitations of evaluation and management by telemedicine and the availability of in person appointments. The patient expressed understanding and agreed to proceed.   HPI: 60 y/o WM with myotonic dystrophy who is being seen accompanied by his wife [name] and son [name] for cough.  Covid + resp illness 07/07/21, I rx'd antiviral (paxlovid). He has lots of coughing at baseline d/t swallowing dysfunction from his MD, hx aspiration. Sounds like cough increased acutely and he had 1 d of fever when he got covid.  Recent episode of post tussive emesis that seemed unrelated to swallowing/eating so question of cough secondary to ongoing resp infection came up. No recent fever.  He denies feeling any SOB or wheezing.   Has chronic severe fatigue/gen weakness/debilitation. He does sometimes feel that coughing does bring up secretions/mucous to a point where he thinks yankauer suction would be helpful (it was helpful when he was in hosp).  [name] c/o increased prob with depression lately.  He had palliative care eval 07/13/21. No changes made, no decision regarding symptomatic care made b/c [name] was in the midst of illness at that time and didn't think he could make decisions about treatment goals at tha time.    ROS: no diarrhea, no nausea, no chest pain, no abd pain.  Past Medical History:  Diagnosis Date   Adult ADHD    Aspiration pneumonitis ([place]) 04/2017, 08/2017   Recurrent   Chronic respiratory failure ([place])    As of 04/2018, Dr. Chase Caller suspects hypercapnic RF--but [name] normal.  May eventually need nocturnal noninvasive ventilation.  Ultimately, if progresses  enough [name] will need trach.   Constipation, chronic    Has caused irritative urinary/bladder symptoms in the past.   Depression    Dizziness 06/2011+   Noncontrast CT and noncontrast MRI both negative 06/2011 at [facility] ED visit.   Elevated transaminase level    Liver biopsy revealed fatty liver   Erectile dysfunction    Esophageal dysmotility    Barium swallow w/out stricture 04/2021. [name] is aspirating   Facial fracture ([place]) 06/2011   Nondisplaced bilateral LeFort I fracture: no surgery required. (ENT, Dr. Wilburn Cornelia)    Fatty liver disease, nonalcoholic    GERD (gastroesophageal reflux disease)    History of thyroid cancer 2010   Medullary carcinoma of thyroid. Thyroidectomy 08/2009 (Dr. Aida Puffer at [facility]).  Needs periodic serum calcitonin monitoring.   Hyperlipidemia    Hypothyroidism    LVH (left ventricular hypertrophy)    by EKG criteria 06/2011.  Hx of heart murmur--Followed by cardiology at [facility]: EF's have been "stable" per cardio office note 12/29/10.  His echo 12/2012 showed normal LV size and wall thickness but EF 45-50% and global LV hypokinesis was found.   Myotonic dystrophy ([place]) Dx'd 1996   Progressive muscle weakness and swallowing dysfunction.  Initial dx by Dr. Jannifer Franklin, neurologist, but subsequent annual specialist f/u has been with Dr. Ladon Applebaum [facility].  Progressive debilitation--[name] referral 04/2017, [name] quit [name] 05/2017.   Nephrolithiasis    Obesity, Class I, BMI 30-34.9    OSA (obstructive sleep apnea)    SEVERE 08/2019 sleep study). CPAP to be initiated as of 04/18/20 pulm (Dr. Annamaria Boots).  Bipap initiated in hosp 04/2021, home on this as well.   Pulmonary nodule, right 05/2017   RUL, 7 mm sub solid nodule.  Resolution noted on f/u CT 08/2017.    Past Surgical History:  Procedure Laterality Date   COLONOSCOPY  09/13/2007   normal (Dr. Deatra Ina).   EXTRACORPOREAL SHOCK WAVE LITHOTRIPSY     Home sleep study  08/31/2019   Severe OSA   THYROIDECTOMY  08/2009   for medullary thyroid  cancer (Dr. Conley Canal at [facility] follows him for this).   TRANSTHORACIC ECHOCARDIOGRAM  12/2012; 09/2017   2014-Normal LV size and wall thickness, LV EF 45-50%, with global hypokinesis of LV.  2018- EF 50-55%, normal wall motion, grd I DD, valves normal.     Current Outpatient Medications:    acetaminophen (TYLENOL) 500 MG tablet, Take 1,000 mg by mouth at bedtime., Disp: , Rfl:    amphetamine-dextroamphetamine (ADDERALL XR) 10 MG 24 hr capsule, TAKE 1 CAPSULE BY MOUTH ONCE DAILY (TO BE TAKEN WITH 30 MG XR CAPS), Disp: 30 capsule, Rfl: 0   amphetamine-dextroamphetamine (ADDERALL XR) 30 MG 24 hr capsule, TAKE 1 CAPSULE BY MOUTH ONCE DAILY, Disp: 30 capsule, Rfl: 0   azithromycin (ZITHROMAX) 250 MG tablet, 2 tabs po qd x 1d, then 1 tab po qd x 4d, Disp: 6 tablet, Rfl: 0   clotrimazole-betamethasone (LOTRISONE) cream, APPLY TO THE AFFECTED AREA(S) TWICE DAILY AS NEEDED (Patient taking differently: Apply 1 application topically 2 (two) times daily as needed (to affected areas).), Disp: 45 g, Rfl: 1   FLUoxetine (PROZAC) 40 MG capsule, TAKE 1 CAPSULE BY MOUTH ONCE DAILY (Patient taking differently: Take 40 mg by mouth daily.), Disp: 90 capsule, Rfl: 1   glycopyrrolate (ROBINUL) 2 MG tablet, TAKE 1/2 TO 1 TABLET BY MOUTH TWO TIMES DAILY AS NEEDED FOR EXCESSIVE ORAL SECRETIONS (Patient taking differently: Take 1-2 mg by mouth See admin instructions. Take 2 mg by mouth at bedtime and an additional 1-2 mg once a day as needed FOR EXCESSIVE ORAL SECRETIONS), Disp: 60 tablet, Rfl: 6   HYDROcodone bit-homatropine (HYCODAN) 5-1.5 MG/5ML syrup, Take 5 mLs by mouth every 6 (six) hours as needed for cough., Disp: 120 mL, Rfl: 0   levothyroxine (SYNTHROID) 150 MCG tablet, TAKE 1 TABLET BY MOUTH ONCE DAILY EXCEPT FOR SUNDAY SKIP DOSE, Disp: 28 tablet, Rfl: 0   Melatonin 10 MG CAPS, Take 10 mg by mouth at bedtime., Disp: , Rfl:    omeprazole (PRILOSEC) 40 MG capsule, TAKE 1 CAPSULE BY MOUTH ONCE DAILY, Disp: 90 capsule,  Rfl: 2   traZODone (DESYREL) 100 MG tablet, Take 2 tablets by mouth daily at bedtime. (Patient taking differently: Take 200 mg by mouth at bedtime.), Disp: 180 tablet, Rfl: 3   COVID-19 At Home Antigen Test KIT, Use as directed in package instructions (Patient not taking: Reported on 07/27/2021), Disp: 4 each, Rfl: 0   ondansetron (ZOFRAN) 8 MG tablet, Take 1 tablet (8 mg total) by mouth every 8 (eight) hours as needed for nausea or vomiting. (Patient not taking: Reported on 07/27/2021), Disp: 20 tablet, Rfl: 1   Simethicone 125 MG TABS, Take 125 mg by mouth every 6 (six) hours as needed for flatulence. (Patient not taking: Reported on 07/27/2021), Disp: , Rfl:   EXAM:  VITALS per patient if applicable:  Vitals with BMI 07/27/2021 05/31/2021 05/19/2021  Height - $Remove'5\' 4"'ZQLuJeN$  -  Weight - 224 lbs -  BMI - 61.95 -  Systolic 093 267 124  Diastolic 59 69 71  Pulse 88 87 93    GENERAL: alert, oriented, appears well and in no acute distress  HEENT: atraumatic, conjunttiva clear, no obvious abnormalities on inspection of external nose and ears  NECK: normal movements of the head and neck  LUNGS: on inspection no signs of respiratory distress, breathing rate appears normal, no obvious gross SOB, gasping or wheezing  CV: no obvious cyanosis  MS: moves all visible extremities without noticeable abnormality  PSYCH/NEURO: pleasant and cooperative, no obvious depression or anxiety, speech and thought processing grossly intact  LABS: none today    Chemistry      Component Value Date/Time   NA 143 05/31/2021 1147   K 4.2 05/31/2021 1147   CL 100 05/31/2021 1147   CO2 35 (H) 05/31/2021 1147   BUN 10 05/31/2021 1147   CREATININE 0.65 05/31/2021 1147   CREATININE 0.95 02/20/2013 1604      Component Value Date/Time   CALCIUM 9.2 05/31/2021 1147   ALKPHOS 41 05/19/2021 0213   AST 19 05/19/2021 0213   ALT 21 05/19/2021 0213   BILITOT 0.7 05/19/2021 0623     Lab Results  Component Value Date   TSH  0.098 (L) 05/12/2021   Lab Results  Component Value Date   WBC 3.7 (L) 05/19/2021   HGB 12.7 (L) 05/19/2021   HCT 40.1 05/19/2021   MCV 94.6 05/19/2021   PLT 174 05/19/2021   ASSESSMENT AND PLAN:  Discussed the following assessment and plan:  1) Cough, [name] with progressive weakness and swallowing dysfunction d/t myotonic MD. I suspect combo of recurrent airway protective mechanism (swallowing dysfunction, aspiration) and possible bacterial pneumonia vs lingering covid resp infection symptoms. I think yankauer suction would be helpful at times--will see if we can get this for him via [facility]. If not, then possibly wife could discuss with palliative care at f/u next week. In the meantime I'll tx with azithromycin x 5d.   Mucinex made him feel worse, said cough worse. I discussed use of hydrocodone cough syrup and made [name]/family aware that suppressing cough could make airway protection more problematic, plus risk for resp depression on opioid. They elected to try hycodan syrup and I agree.  2) Recurrent MDD, moderate episode. Increase fluox to $Remove'60mg'jEAOtjV$  qd dosing.  I discussed the assessment and treatment plan with the patient. The patient was provided an opportunity to ask questions and all were answered. The patient agreed with the plan and demonstrated an understanding of the instructions.   F/u: if not signif improving in 5-7 d   Signed:  Crissie Sickles, MD           07/27/2021

## 2021-07-28 ENCOUNTER — Other Ambulatory Visit (HOSPITAL_COMMUNITY): Payer: Self-pay

## 2021-08-04 ENCOUNTER — Other Ambulatory Visit: Payer: 59

## 2021-08-04 ENCOUNTER — Other Ambulatory Visit: Payer: Self-pay

## 2021-08-04 ENCOUNTER — Other Ambulatory Visit: Payer: 59 | Admitting: *Deleted

## 2021-08-04 DIAGNOSIS — Z515 Encounter for palliative care: Secondary | ICD-10-CM

## 2021-08-06 ENCOUNTER — Other Ambulatory Visit (HOSPITAL_COMMUNITY): Payer: Self-pay

## 2021-08-11 NOTE — Progress Notes (Signed)
COMMUNITY PALLIATIVE CARE SW NOTE  PATIENT NAME: SOFIA JOHNSON DOB: 07-Jan-1961 MRN: [id number]  PRIMARY CARE PROVIDER: Tammi Sou, MD  RESPONSIBLE PARTY:  Acct ID - Guarantor Home Phone Work Phone Relationship Acct Type  [id number] - Farewell,JEFF* [phone]  Self P/F     [address], [place], [place] [address]   Due to the COVID-19 crisis, this virtual check-in visit was done via telephone from my office and it was initiated and consent by this patient.   PLAN OF CARE and INTERVENTIONS:             GOALS OF CARE/ ADVANCE CARE PLANNING:  Goal is for patient to remain at home. He is a FULL CODE. SOCIAL/EMOTIONAL/SPIRITUAL ASSESSMENT/ INTERVENTIONS:  SW completed a Sales executive with patient. Patient reported he has no new issues or concerns, His dysphagia continues as related to the progression of muscular dystrophy. Since recovering from [place], he is still experiencing some ongoing weakness. His appetite remains good, really focusing on smaller bites of food. He can feed himself finger foods. He is sleeping well at night, but he still has a lingering cough. Patient continues to ambulates utilizing a wheelchair and transfers with a hoyer lift.He is dependent for all ADL's. Patient wears undergarments for incontinence episodes. SW provided supportive counseling, assessment of needs and comfort of patient; coping of PCG and reinforced access to support. PATIENT/CAREGIVER EDUCATION/ COPING:  Patient/PCG appears to be coping well.  PERSONAL EMERGENCY PLAN:  911 can be activated as needed. COMMUNITY RESOURCES COORDINATION/ HEALTH CARE NAVIGATION:  None FINANCIAL/LEGAL CONCERNS/INTERVENTIONS:  Yes     SOCIAL HX:  Social History   Tobacco Use   Smoking status: Never   Smokeless tobacco: Never  Substance Use Topics   Alcohol use: Yes    Comment: rare beer    CODE STATUS: Full Code ADVANCED DIRECTIVES: No MOST FORM COMPLETE: No HOSPICE EDUCATION PROVIDED: No  PPS:  Patient is wheelchair bound and totally dependent for ADL's.  Documentation and virtual visit: 30 minutes   Katheren Puller, LCSW

## 2021-08-17 ENCOUNTER — Other Ambulatory Visit (HOSPITAL_COMMUNITY): Payer: Self-pay

## 2021-08-18 ENCOUNTER — Other Ambulatory Visit: Payer: Self-pay | Admitting: *Deleted

## 2021-08-18 NOTE — Patient Outreach (Signed)
[facility]) Care Management  08/18/2021  DIMAS BOMBA [birth year] [id number]   Telephone Assessment    Subjective  Unsuccessful outreach call to patient wife, Madix Gathright , no answer able to leave a HIPAA compliant voice mail message for return call.   Plan If no return call on today, will plan outreach call in the next week.    [name], RN, BSN  [facility] Management Coordinator  [phone]- Mobile [phone]- Toll Free Main Office

## 2021-08-19 DIAGNOSIS — J9611 Chronic respiratory failure with hypoxia: Secondary | ICD-10-CM | POA: Diagnosis not present

## 2021-08-19 DIAGNOSIS — I509 Heart failure, unspecified: Secondary | ICD-10-CM | POA: Diagnosis not present

## 2021-08-19 DIAGNOSIS — G7111 Myotonic muscular dystrophy: Secondary | ICD-10-CM | POA: Diagnosis not present

## 2021-08-19 DIAGNOSIS — J9612 Chronic respiratory failure with hypercapnia: Secondary | ICD-10-CM | POA: Diagnosis not present

## 2021-08-24 ENCOUNTER — Other Ambulatory Visit: Payer: Self-pay | Admitting: Family Medicine

## 2021-08-24 ENCOUNTER — Other Ambulatory Visit (HOSPITAL_COMMUNITY): Payer: Self-pay

## 2021-08-24 MED ORDER — LEVOTHYROXINE SODIUM 150 MCG PO TABS
ORAL_TABLET | ORAL | 0 refills | Status: DC
  Filled 2021-08-24: qty 28, 32d supply, fill #0

## 2021-08-24 NOTE — Telephone Encounter (Signed)
Requesting: adderall '10mg'$  Contract: 01/26/21 UDS: N/A Last Visit:07/27/21 Next Visit:08/31/21 Last Refill: 07/19/21(30,0)  Requesting: adderall '30mg'$  Contract: 01/26/21 UDS:N/A Last Visit:07/27/21 Next Visit:08/31/21 Last Refill: 07/19/21(30,0)  Please review and advise, meds pending

## 2021-08-25 ENCOUNTER — Other Ambulatory Visit (HOSPITAL_COMMUNITY): Payer: Self-pay

## 2021-08-25 ENCOUNTER — Other Ambulatory Visit: Payer: Self-pay | Admitting: *Deleted

## 2021-08-25 MED ORDER — AMPHETAMINE-DEXTROAMPHET ER 30 MG PO CP24
ORAL_CAPSULE | Freq: Every day | ORAL | 0 refills | Status: DC
  Filled 2021-08-25: qty 30, 30d supply, fill #0

## 2021-08-25 MED ORDER — AMPHETAMINE-DEXTROAMPHET ER 10 MG PO CP24
ORAL_CAPSULE | ORAL | 0 refills | Status: DC
  Filled 2021-08-25: qty 30, 30d supply, fill #0

## 2021-08-25 NOTE — Patient Outreach (Signed)
[facility]) Care Management  08/25/2021  [name] Dec 03, 1961 [id number]   Telephone Assessment     #2 Outreach call attempt  Subjective  Unsuccessful outreach call to patient wife, [name] , no answer able to leave a HIPAA compliant voice mail message for return call.    Plan If no return call on today, will plan outreach call in the next 2 week.      Robert Draft, RN, BSN  [facility] Management Coordinator  [phone]- Mobile [phone]- Toll Free Main Office

## 2021-08-25 NOTE — Telephone Encounter (Signed)
Pt's wife, [name] made aware refills sent.

## 2021-08-31 ENCOUNTER — Encounter: Payer: Self-pay | Admitting: Family Medicine

## 2021-08-31 ENCOUNTER — Other Ambulatory Visit (HOSPITAL_COMMUNITY): Payer: Self-pay

## 2021-08-31 ENCOUNTER — Ambulatory Visit (INDEPENDENT_AMBULATORY_CARE_PROVIDER_SITE_OTHER): Payer: 59 | Admitting: Family Medicine

## 2021-08-31 ENCOUNTER — Other Ambulatory Visit: Payer: Self-pay

## 2021-08-31 VITALS — BP 120/76 | HR 86 | Wt 234.0 lb

## 2021-08-31 DIAGNOSIS — R631 Polydipsia: Secondary | ICD-10-CM | POA: Diagnosis not present

## 2021-08-31 DIAGNOSIS — E890 Postprocedural hypothyroidism: Secondary | ICD-10-CM | POA: Diagnosis not present

## 2021-08-31 LAB — BASIC METABOLIC PANEL
BUN: 11 mg/dL (ref 6–23)
CO2: 29 mEq/L (ref 19–32)
Calcium: 9.8 mg/dL (ref 8.4–10.5)
Chloride: 100 mEq/L (ref 96–112)
Creatinine, Ser: 0.84 mg/dL (ref 0.40–1.50)
GFR: 94.55 mL/min (ref >60.00)
Glucose, Bld: 101 mg/dL — ABNORMAL HIGH (ref 70–99)
Potassium: 3.8 mEq/L (ref 3.5–5.1)
Sodium: 140 mEq/L (ref 135–145)

## 2021-08-31 LAB — TSH: TSH: 1.1 u[IU]/mL (ref 0.35–5.50)

## 2021-08-31 MED ORDER — AMPHETAMINE-DEXTROAMPHET ER 30 MG PO CP24
ORAL_CAPSULE | Freq: Every day | ORAL | 0 refills | Status: DC
  Filled 2021-08-31: qty 30, fill #0

## 2021-08-31 MED ORDER — AMPHETAMINE-DEXTROAMPHET ER 10 MG PO CP24
ORAL_CAPSULE | ORAL | 0 refills | Status: DC
  Filled 2021-08-31: qty 30, fill #0

## 2021-08-31 MED ORDER — AMPHETAMINE-DEXTROAMPHET ER 10 MG PO CP24
ORAL_CAPSULE | ORAL | 0 refills | Status: DC
  Filled 2021-08-31: qty 30, fill #0
  Filled 2021-09-25 – 2021-11-22 (×2): qty 30, 30d supply, fill #0

## 2021-08-31 MED ORDER — AMPHETAMINE-DEXTROAMPHET ER 30 MG PO CP24
ORAL_CAPSULE | Freq: Every day | ORAL | 0 refills | Status: DC
  Filled 2021-08-31: qty 30, fill #0
  Filled 2021-09-25: qty 30, 30d supply, fill #0

## 2021-08-31 NOTE — Progress Notes (Signed)
OFFICE VISIT  08/31/2021  CC:  Chief Complaint  Patient presents with   Follow-up    3 mo f/u. Pt c/o not able to stand for long periods and decrease in walking ability.    HPI:    Patient is a 60 y.o. Caucasian male who presents accompanied by his wife [name] for f/u progressive generalized weakness and immobility d/t myotonic dystrophy.  He has hypothyroidism, esophageal dysmotility, hx of aspiration, OSA, excessive daytime somnolence, ADD, and recurrent MDD. A/P as of last visit 07/27/21: "1) Cough, pt with progressive weakness and swallowing dysfunction d/t myotonic MD. I suspect combo of recurrent airway protective mechanism (swallowing dysfunction, aspiration) and possible bacterial pneumonia vs lingering covid resp infection symptoms. I think yankauer suction would be helpful at times--will see if we can get this for him via [facility]. If not, then possibly wife could discuss with palliative care at f/u next week. In the meantime I'll tx with azithromycin x 5d.   Mucinex made him feel worse, said cough worse. I discussed use of hydrocodone cough syrup and made pt/family aware that suppressing cough could make airway protection more problematic, plus risk for resp depression on opioid. They elected to try hycodan syrup and I agree.   2) Recurrent MDD, moderate episode. Increase fluox to $Remove'60mg'AONyzFA$  qd dosing."  INTERIM HX: Cough resolved not long after last visit. Feels like resp status at baseline. Still signif generalized weakness, lower body > upper body.  Getting more trouble with balance when trying to ambulate with walker.  He has finished with HH PT and declines to have this any further, feels like it is not helpful.  He c/o intermittent bilat feet cold.  Similar but milder/less frequent feeling in hands.  No color changes or pain.  He hasn't had much prob with choking or excessive coughing while eating lately.  Eats soft pureed food.  No thickened liquids. His level of daytime somnolence  remains a problem but he is on maximal dosing of stimulant and he refuses to wear his bipap.    Wife says his tendency to speak about not wanting to be alive anymore has decreased since we increased his fluoxetine a month ago.    PMP AWARE reviewed today: most recent rx for adderall xr 30 and 10 was filled 08/25/21, # 30 of each, rx by me. No red flags.  Past Medical History:  Diagnosis Date   Adult ADHD    Aspiration pneumonitis ([place]) 04/2017, 08/2017   Recurrent   Chronic respiratory failure ([place])    As of 04/2018, Dr. Chase Caller suspects hypercapnic RF--but [name] normal.  May eventually need nocturnal noninvasive ventilation.  Ultimately, if progresses enough pt will need trach.   Constipation, chronic    Has caused irritative urinary/bladder symptoms in the past.   Depression    Dizziness 06/2011+   Noncontrast CT and noncontrast MRI both negative 06/2011 at [facility] ED visit.   Elevated transaminase level    Liver biopsy revealed fatty liver   Erectile dysfunction    Esophageal dysmotility    Barium swallow w/out stricture 04/2021. Pt is aspirating   Facial fracture ([place]) 06/2011   Nondisplaced bilateral LeFort I fracture: no surgery required. (ENT, Dr. Wilburn Cornelia)    Fatty liver disease, nonalcoholic    GERD (gastroesophageal reflux disease)    History of thyroid cancer 2010   Medullary carcinoma of thyroid. Thyroidectomy 08/2009 (Dr. Aida Puffer at [facility]).  Needs periodic serum calcitonin monitoring.   Hyperlipidemia    Hypothyroidism  LVH (left ventricular hypertrophy)    by EKG criteria 06/2011.  Hx of heart murmur--Followed by cardiology at [facility]: EF's have been "stable" per cardio office note 12/29/10.  His echo 12/2012 showed normal LV size and wall thickness but EF 45-50% and global LV hypokinesis was found.   Myotonic dystrophy ([place]) Dx'd 1996   Progressive muscle weakness and swallowing dysfunction.  Initial dx by Dr. Jannifer Franklin, neurologist, but subsequent annual specialist f/u has  been with Dr. Ladon Applebaum [facility].  Progressive debilitation--PT referral 04/2017, pt quit PT 05/2017.   Nephrolithiasis    Obesity, Class I, BMI 30-34.9    OSA (obstructive sleep apnea)    SEVERE 08/2019 sleep study). CPAP to be initiated as of 04/18/20 pulm (Dr. Annamaria Boots). Bipap initiated in hosp 04/2021, home on this as well.   Pulmonary nodule, right 05/2017   RUL, 7 mm sub solid nodule.  Resolution noted on f/u CT 08/2017.    Past Surgical History:  Procedure Laterality Date   COLONOSCOPY  09/13/2007   normal (Dr. Deatra Ina).   EXTRACORPOREAL SHOCK WAVE LITHOTRIPSY     Home sleep study  08/31/2019   Severe OSA   THYROIDECTOMY  08/2009   for medullary thyroid cancer (Dr. Conley Canal at [facility] follows him for this).   TRANSTHORACIC ECHOCARDIOGRAM  12/2012; 09/2017   2014-Normal LV size and wall thickness, LV EF 45-50%, with global hypokinesis of LV.  2018- EF 50-55%, normal wall motion, grd I DD, valves normal.    Outpatient Medications Prior to Visit  Medication Sig Dispense Refill   acetaminophen (TYLENOL) 500 MG tablet Take 1,000 mg by mouth at bedtime.     azithromycin (ZITHROMAX) 250 MG tablet Take 2 tablets by mouth daily for 1 day then 1 tablet daily for 4 days 6 tablet 0   clotrimazole-betamethasone (LOTRISONE) cream APPLY TO THE AFFECTED AREA(S) TWICE DAILY AS NEEDED (Patient taking differently: Apply 1 application topically 2 (two) times daily as needed (to affected areas).) 45 g 1   COVID-19 At Home Antigen Test KIT Use as directed in package instructions (Patient not taking: Reported on 07/27/2021) 4 each 0   FLUoxetine (PROZAC) 20 MG tablet Take 3 tablets (60 mg total) by mouth daily. 90 tablet 6   glycopyrrolate (ROBINUL) 2 MG tablet TAKE 1/2 TO 1 TABLET BY MOUTH TWO TIMES DAILY AS NEEDED FOR EXCESSIVE ORAL SECRETIONS (Patient taking differently: Take 1-2 mg by mouth See admin instructions. Take 2 mg by mouth at bedtime and an additional 1-2 mg once a day as needed FOR EXCESSIVE ORAL  SECRETIONS) 60 tablet 6   levothyroxine (SYNTHROID) 150 MCG tablet TAKE 1 TABLET BY MOUTH ONCE DAILY EXCEPT FOR SUNDAY SKIP DOSE.  MUST KEEP UPCOMING APPT 28 tablet 0   Melatonin 10 MG CAPS Take 10 mg by mouth at bedtime.     omeprazole (PRILOSEC) 40 MG capsule TAKE 1 CAPSULE BY MOUTH ONCE DAILY 90 capsule 2   ondansetron (ZOFRAN) 8 MG tablet Take 1 tablet (8 mg total) by mouth every 8 (eight) hours as needed for nausea or vomiting. (Patient not taking: Reported on 07/27/2021) 20 tablet 1   Simethicone 125 MG TABS Take 125 mg by mouth every 6 (six) hours as needed for flatulence. (Patient not taking: Reported on 07/27/2021)     traZODone (DESYREL) 100 MG tablet Take 2 tablets by mouth daily at bedtime. (Patient taking differently: Take 200 mg by mouth at bedtime.) 180 tablet 3   amphetamine-dextroamphetamine (ADDERALL XR) 10 MG 24 hr capsule TAKE  1 CAPSULE BY MOUTH ONCE DAILY (TO BE TAKEN WITH 30 MG XR CAPS) 30 capsule 0   amphetamine-dextroamphetamine (ADDERALL XR) 30 MG 24 hr capsule TAKE 1 CAPSULE BY MOUTH ONCE DAILY 30 capsule 0   HYDROcodone bit-homatropine (HYCODAN) 5-1.5 MG/5ML syrup Take 5 mLs by mouth every 6 (six) hours as needed for cough. 120 mL 0   No facility-administered medications prior to visit.    No Known Allergies  ROS As per HPI  PE: Vitals with BMI 08/31/2021 07/27/2021 05/31/2021  Height - - $R'5\' 4"'MM$   Weight 234 lbs - 224 lbs  BMI - - 04.88  Systolic 891 694 503  Diastolic 76 59 69  Pulse 86 88 87   Gen: Alert, well appearing.  Patient is oriented to person, place, time, and situation. AFFECT: flat as per his usual.  Speech soft and mumbling as per his usual. CV: RRR, no m/r/g.   LUNGS: CTA bilat, nonlabored resps, good aeration in all lung fields. EXT: no clubbing or cyanosis.  no edema.   LABS:  Lab Results  Component Value Date   TSH 0.098 (L) 05/12/2021   Lab Results  Component Value Date   WBC 3.7 (L) 05/19/2021   HGB 12.7 (L) 05/19/2021   HCT 40.1  05/19/2021   MCV 94.6 05/19/2021   PLT 174 05/19/2021   Lab Results  Component Value Date   CREATININE 0.65 05/31/2021   BUN 10 05/31/2021   NA 143 05/31/2021   K 4.2 05/31/2021   CL 100 05/31/2021   CO2 35 (H) 05/31/2021   Lab Results  Component Value Date   ALT 21 05/19/2021   AST 19 05/19/2021   ALKPHOS 41 05/19/2021   BILITOT 0.7 05/19/2021   Lab Results  Component Value Date   CHOL 262 (H) 01/26/2021   Lab Results  Component Value Date   HDL 85.20 01/26/2021   Lab Results  Component Value Date   LDLCALC 148 (H) 01/26/2021   Lab Results  Component Value Date   TRIG 142.0 01/26/2021   Lab Results  Component Value Date   CHOLHDL 3 01/26/2021   Lab Results  Component Value Date   PSA 0.35 08/02/2019   PSA 0.38 07/17/2018   PSA 0.57 08/15/2012    IMPRESSION AND PLAN:  1) Myotonic muscular dystrophy->progressive generalized weakness.  Impaired mobility, unsteady gait. He refuses any further PT/OT. He is well cared for by his wife and son.  2) Esophageal dysmotility, high aspiration risk: no signif problems of late. He does take glycopyrrolate to minimize oropharyngeal secretions.  Cont omeprazole to minimize gastric acid.  3) Recurrent MDD: he is some improved on fluoxetine $RemoveBefor'60mg'tAEqKcTwkzmy$  qd. Continue this.  4) Hypothyroidism: supposed to be taking 128mcg 6 d/week but compliance has not been very good per wife.   TSH was a little up 01/2021 but we elected not to change dose. TSH during hospitalization 04/2021 was a little low->no dose change was made. Recheck TSH today.  5) Adult ADD and excessive daytime somnolence. Cont adderall xr $RemoveBef'30mg'gRozuXbbqR$ '10mg'$  qAM. I did electronic rx's for these meds today for each of the next 3 months.  Appropriate fill on/after date was noted on each rx.  An After Visit Summary was printed and given to the patient.  FOLLOW UP: Return in about 3 months (around 11/30/2021) for routine chronic illness f/u.  Signed:  Crissie Sickles, MD            08/31/2021

## 2021-09-08 ENCOUNTER — Other Ambulatory Visit: Payer: Self-pay | Admitting: *Deleted

## 2021-09-08 NOTE — Patient Outreach (Addendum)
[place] [facility]) Care Management  09/08/2021  MAXSON SOQUI 22-Feb-1961 [id number]  Telephone Assessment   Referral Date :11/16/20 Referral source: PCP office  Referral reason : Social worker services, son unable to work due to caring for patient , Are there financial services available to help family?   Insurance : [place]   Patient is 59 year old male with history of Myotonic muscular dystrophy, wheelchair bound, hypothyroidism.  Subjective  Successful outreach call to patient wife Makhai Outing for follow up. She discussed that are hanging in there, managing better than expected since son moved out of town.   She discussed patient tolerating current diet without new concern for swallowing or aspiration, wife associates prior episode around the the time he had covid.  Patient has declined use of bipap.  Wife reports patient being able to get to up chair with assistance  tolerating very short distances in walking.  Wife to discuss with patient scheduling next in person in home visit.    Goals Addressed             This Visit's Progress    Find Help in My Community   On track    Timeframe:  Long-Range Goal Priority:  Medium Start Date:   12/01/20                          Expected End Date:  11/24/21               Follow Up Date 10/27/21   - follow-up on any referrals for help I am given    Why is this important?   Knowing how and where to find help for yourself or family in your neighborhood and community is an important skill.  You will want to take some steps to learn how.    Notes:  9/14 Reviewed benefit of seeking resources needs sooner , verified has contact and agreeable to following up RN sooner if new concerns.  7/27, Discussed benefit of having caregiver support in home, wife verifies having information previously sent to review if patient agreeable to in home personal care support  Home health therapy services in place encouraging  participation      [place] My Quality of Life       Timeframe:  Long-Range Goal Priority:  Medium Start Date:     12/01/20                        Expected End Date:  11/24/21            Follow Up Date 10/27/21   - discuss my treatment options with the doctor or nurse Reviewed palliative care services for support with navigating chronic condition    Why is this important?   Having a long-term illness can be scary.  It can also be stressful for you and your caregiver.  These steps may help.    Notes: 9/14  Wife to reschedule home visit with palliative care after discussion with patient for agreement of his wishes.  7/27 Initial palliative care visit telephonic as patient had Covid during time of scheduled visit. Planned next visit in person. Wife supporting patient wishes as he continues to make health decisions, choosing not to wear bipap at this time, at times declining to return to hospital . Again reviewed worsening symptoms of pneumonia or aspiration to notify provider of evaluation/recommendations.  Palliative care home visit date  set         Plan Patient wife is agreeable to plan and follow up in the next 2 month, November wife agrees to sooner outreach if new concerns. Will send PCP visit note .   [name], RN, BSN  [place] Management Coordinator  [phone]- Mobile [phone]- Toll Free Main Office

## 2021-09-13 ENCOUNTER — Telehealth: Payer: Self-pay

## 2021-09-13 NOTE — Telephone Encounter (Signed)
[name] with [employer] calling to inform patient does not want to continue on ventilator ??  Per Glenard Haring, DC note to okay this by Dr. Anitra Lauth can be faxed to [phone].   Glenard Haring can be reached at [phone], he states you can leave message, if he is not available.

## 2021-09-13 NOTE — Telephone Encounter (Signed)
Please review and advise.

## 2021-09-13 NOTE — Telephone Encounter (Signed)
Spoke with Glenard Haring to clarify d/c bipap or actual ventilator. He said ventilator, pt has tried using different mask sizes but still experiencing discomfort and refusing to wear it. Letter printed and faxed to number provided.

## 2021-09-19 DIAGNOSIS — G7111 Myotonic muscular dystrophy: Secondary | ICD-10-CM | POA: Diagnosis not present

## 2021-09-19 DIAGNOSIS — J9612 Chronic respiratory failure with hypercapnia: Secondary | ICD-10-CM | POA: Diagnosis not present

## 2021-09-19 DIAGNOSIS — I509 Heart failure, unspecified: Secondary | ICD-10-CM | POA: Diagnosis not present

## 2021-09-19 DIAGNOSIS — J9611 Chronic respiratory failure with hypoxia: Secondary | ICD-10-CM | POA: Diagnosis not present

## 2021-09-20 ENCOUNTER — Other Ambulatory Visit (HOSPITAL_COMMUNITY): Payer: Self-pay

## 2021-09-25 ENCOUNTER — Other Ambulatory Visit: Payer: Self-pay | Admitting: Family Medicine

## 2021-09-25 ENCOUNTER — Other Ambulatory Visit (HOSPITAL_COMMUNITY): Payer: Self-pay

## 2021-09-27 ENCOUNTER — Other Ambulatory Visit (HOSPITAL_COMMUNITY): Payer: Self-pay

## 2021-09-27 ENCOUNTER — Other Ambulatory Visit: Payer: Self-pay | Admitting: Family Medicine

## 2021-09-27 MED ORDER — AMPHETAMINE-DEXTROAMPHET ER 30 MG PO CP24
ORAL_CAPSULE | Freq: Every day | ORAL | 0 refills | Status: DC
  Filled 2021-09-27 – 2021-11-22 (×2): qty 30, 30d supply, fill #0

## 2021-09-27 MED ORDER — AMPHETAMINE-DEXTROAMPHET ER 30 MG PO CP24
ORAL_CAPSULE | Freq: Every day | ORAL | 0 refills | Status: DC
  Filled 2021-09-27: qty 30, 30d supply, fill #0

## 2021-09-27 MED ORDER — AMPHETAMINE-DEXTROAMPHET ER 10 MG PO CP24
ORAL_CAPSULE | ORAL | 0 refills | Status: DC
  Filled 2021-09-27: qty 30, 30d supply, fill #0

## 2021-09-27 MED ORDER — LEVOTHYROXINE SODIUM 150 MCG PO TABS
150.0000 ug | ORAL_TABLET | Freq: Every day | ORAL | 1 refills | Status: DC
  Filled 2021-09-27: qty 84, 84d supply, fill #0
  Filled 2022-01-09: qty 84, 84d supply, fill #1

## 2021-09-27 NOTE — Telephone Encounter (Signed)
Pharmacy comment: need new rx...the one sent today says we cant fill until november!

## 2021-09-27 NOTE — Telephone Encounter (Signed)
Requesting: adderall 30 & 10mg  Contract:01/26/21 UDS: n/a Last Visit:08/31/21 Next Visit: advised to f/u 3 mo. Last Refill:08/31/21(30,0)  Please review and advise, med pending

## 2021-09-27 NOTE — Telephone Encounter (Signed)
New rx sent

## 2021-09-29 ENCOUNTER — Other Ambulatory Visit (HOSPITAL_COMMUNITY): Payer: Self-pay

## 2021-10-19 DIAGNOSIS — J9612 Chronic respiratory failure with hypercapnia: Secondary | ICD-10-CM | POA: Diagnosis not present

## 2021-10-19 DIAGNOSIS — G7111 Myotonic muscular dystrophy: Secondary | ICD-10-CM | POA: Diagnosis not present

## 2021-10-19 DIAGNOSIS — J9611 Chronic respiratory failure with hypoxia: Secondary | ICD-10-CM | POA: Diagnosis not present

## 2021-10-19 DIAGNOSIS — I509 Heart failure, unspecified: Secondary | ICD-10-CM | POA: Diagnosis not present

## 2021-10-20 ENCOUNTER — Other Ambulatory Visit (HOSPITAL_COMMUNITY): Payer: Self-pay

## 2021-11-03 ENCOUNTER — Other Ambulatory Visit: Payer: Self-pay | Admitting: *Deleted

## 2021-11-03 NOTE — Patient Outreach (Signed)
[place] [facility]) Care Management  [place]  11/03/2021   GRAESON NOURI 12-09-1961 [id number]  Telephone Assessment   Referral Date :11/16/20 Referral source: PCP office  Referral reason : Social worker services, son unable to work due to caring for patient , Are there financial services available to help family?   Insurance : [place]   Patient is 60 year old male with history of Myotonic muscular dystrophy, wheelchair bound, hypothyroidism.   Subjective:  Successful outreach call to patient wife [name], she discussed patient doing about the same. She denies worsening symptoms of coughing, respiratory concerns or swallowing. He continues to tolerate diet . Patient has experienced recent fall while wife assisting him up, reports bruising to one finger that has resolved.   Encounter Medications:  Outpatient Encounter Medications as of 11/03/2021  Medication Sig   acetaminophen (TYLENOL) 500 MG tablet Take 1,000 mg by mouth at bedtime.   amphetamine-dextroamphetamine (ADDERALL XR) 10 MG 24 hr capsule TAKE 1 CAPSULE BY MOUTH ONCE DAILY (TO BE TAKEN WITH 30 MG XR CAPS)   amphetamine-dextroamphetamine (ADDERALL XR) 10 MG 24 hr capsule TAKE 1 CAPSULE BY MOUTH ONCE DAILY (TO BE TAKEN WITH 30 MG XR CAPS)   amphetamine-dextroamphetamine (ADDERALL XR) 30 MG 24 hr capsule TAKE 1 CAPSULE BY MOUTH ONCE DAILY   amphetamine-dextroamphetamine (ADDERALL XR) 30 MG 24 hr capsule TAKE 1 CAPSULE BY MOUTH ONCE DAILY   clotrimazole-betamethasone (LOTRISONE) cream APPLY TO THE AFFECTED AREA(S) TWICE DAILY AS NEEDED (Patient taking differently: Apply 1 application topically 2 (two) times daily as needed (to affected areas).)   FLUoxetine (PROZAC) 20 MG tablet Take 3 tablets (60 mg total) by mouth daily.   glycopyrrolate (ROBINUL) 2 MG tablet TAKE 1/2 TO 1 TABLET BY MOUTH TWO TIMES DAILY AS NEEDED FOR EXCESSIVE ORAL SECRETIONS (Patient taking differently: Take 1-2 mg by mouth See  admin instructions. Take 2 mg by mouth at bedtime and an additional 1-2 mg once a day as needed FOR EXCESSIVE ORAL SECRETIONS)   levothyroxine (SYNTHROID) 150 MCG tablet Take 1 tablet (150 mcg total) by mouth daily before breakfast.   Melatonin 10 MG CAPS Take 10 mg by mouth at bedtime.   omeprazole (PRILOSEC) 40 MG capsule TAKE 1 CAPSULE BY MOUTH ONCE DAILY   traZODone (DESYREL) 100 MG tablet Take 2 tablets by mouth daily at bedtime. (Patient taking differently: Take 200 mg by mouth at bedtime.)   No facility-administered encounter medications on file as of 11/03/2021.    Functional Status:  In your present state of health, do you have any difficulty performing the following activities: 05/12/2021 12/07/2020  Hearing? N N  Vision? N N  Difficulty concentrating or making decisions? N N  Walking or climbing stairs? Y Y  Comment - Patient has Myotonic Muscular Dystrophy - wife and son assist  Dressing or bathing? Y Y  Comment - Patient has Myotonic Muscular Dystrophy - wife and son assist  Doing errands, shopping? Y Y  Comment - Patient has Myotonic Muscular Dystrophy - wife and son assist  Preparing Food and eating ? - Y  Comment - Patient has Myotonic Muscular Dystrophy - wife and son assist  Using the Toilet? - Y  Comment - Patient has Myotonic Muscular Dystrophy - wife and son assist  In the past six months, have you accidently leaked urine? - Y  Comment - Patient has Myotonic Muscular Dystrophy - wife and son assist  Do you have problems with loss of bowel control? -  N  Managing your Medications? - Y  Comment - Patient has Myotonic Muscular Dystrophy - wife and son assist  Managing your Finances? - Y  Comment - Patient has Myotonic Muscular Dystrophy - wife and son assist  Housekeeping or managing your Housekeeping? - Y  Comment - Patient has Myotonic Muscular Dystrophy - wife and son assist  Some recent data might be hidden    Fall/Depression Screening: Fall Risk  12/07/2020   Falls in the past year? 1  Number falls in past yr: 1  Injury with Fall? 1  Risk for fall due to : History of fall(s);Impaired balance/gait;Impaired mobility  Follow up Education provided;Falls prevention discussed   PHQ 2/9 Scores 03/29/2021 12/07/2020 08/02/2019 02/12/2019 07/17/2018 04/11/2018 01/10/2018  PHQ - 2 Score $Remov'5 4 6 2 3 3 5  'OlbGVT$ PHQ- 9 Score $Remov'21 7 19 12 13 16 17    'GpJwNn$ Assessment:   Care Plan Care Plan : General Plan of Care (Adult)  Updates made by Alfonzo Feller, RN since 11/03/2021 12:00 AM     Problem: Quality of Life (General Plan of Care)   Priority: Medium  Onset Date: 12/01/2020     Long-Range Goal: Quality of Life Maintained as evidenced by  patient/family establishing a  palliative care plan over the next 60 days. Completed 11/03/2021  Start Date: 12/01/2020  Expected End Date: 11/24/2021  Priority: High  Note:   Evidence-based guidance:  Assess patient's thoughts about quality of life, goals and expectations, and dissatisfaction or desire to improve.   Assess and monitor for signs/symptoms of psychosocial concerns, especially depression or ideations harm to others or self; provide or refer for mental health services as needed.  I Encourage patient to tap into hope to improve sense of self.  Counsel based on prognosis and as early as possible about end-of-life and palliative care; consider referral to palliative care provider.  Advocate for the development of palliative care plan that may include avoidance of unnecessary testing and intervention, symptom control, discontinuation of medications, hospice and organ donation.  Counsel as early as possible those with life-limiting chronic disease about palliative care; consider referral to palliative care provider.  Advocate for the development of palliative care plan.    Notes:  11/03/21 wife reports planning next follow up with palliative care as patient agrees She reports no progressive changes in condition . Verified  having resource information needs met, palliative care, personal care resources , medicaid resource information on hand.    7/27 initial palliative telephonic visit, reviewed plan for in person visit  Discussion with PCP regarding palliative care, orders placed      Task: Support and Maintain Acceptable Degree of Health, Comfort and Happiness Completed 11/03/2021  Due Date: 11/24/2021  Priority: Routine  Note:   Care Management Activities:    - sleep hygiene techniques encouraged  Reinforced wearing BIPAP   Notes: Reinforced follow up with palliative care as patient agreeable .  Discussed benefit of palliative support with goals of care .  DME company has been consult has seen 2 additional mask to try       Goals Addressed             This Visit's Progress    COMPLETED: Find Help in My Community   On track    Timeframe:  Long-Range Goal Priority:  Medium Start Date:   12/01/20  Expected End Date:  11/24/21               Follow Up Date 10/27/21   - follow-up on any referrals for help I am given    Why is this important?   Knowing how and where to find help for yourself or family in your neighborhood and community is an important skill.  You will want to take some steps to learn how.    Notes:  11/9 goals met, resource information on hand , following up as patient agreeable.  9/14 Reviewed benefit of seeking resources needs sooner , verified has contact and agreeable to following up RN sooner if new concerns.  7/27, Discussed benefit of having caregiver support in home, wife verifies having information previously sent to review if patient agreeable to in home personal care support  Home health therapy services in place encouraging participation      COMPLETED: [place] My Quality of Life   On track    Timeframe:  Long-Range Goal Priority:  Medium Start Date:     12/01/20                        Expected End Date:  11/24/21            Follow Up  Date 10/27/21   - discuss my treatment options with the doctor or nurse Reviewed palliative care services for support with navigating chronic condition    Why is this important?   Having a long-term illness can be scary.  It can also be stressful for you and your caregiver.  These steps may help.    Notes: 11/9 - follow up on management at home, current system working out per wife, and planning follow up palliative care as patient agreeable.  9/14  Wife to reschedule home visit with palliative care after discussion with patient for agreement of his wishes.  7/27 Initial palliative care visit telephonic as patient had Covid during time of scheduled visit. Planned next visit in person. Wife supporting patient wishes as he continues to make health decisions, choosing not to wear bipap at this time, at times declining to return to hospital . Again reviewed worsening symptoms of pneumonia or aspiration to notify provider of evaluation/recommendations.  Palliative care home visit date set         Plan:  Follow-up: Patient requests no follow-up at this time. Will close case to [facility] care management at this time wife identifies goals met and [facility] care management contact number if new concerns.    [name], RN, BSN  [place] Management Coordinator  [phone]- Mobile [phone]- Toll Free Main Office

## 2021-11-15 ENCOUNTER — Other Ambulatory Visit (HOSPITAL_COMMUNITY): Payer: Self-pay

## 2021-11-22 ENCOUNTER — Other Ambulatory Visit (HOSPITAL_COMMUNITY): Payer: Self-pay

## 2021-11-24 DIAGNOSIS — M25571 Pain in right ankle and joints of right foot: Secondary | ICD-10-CM | POA: Diagnosis not present

## 2021-11-24 DIAGNOSIS — R531 Weakness: Secondary | ICD-10-CM | POA: Diagnosis not present

## 2021-11-24 DIAGNOSIS — R609 Edema, unspecified: Secondary | ICD-10-CM | POA: Diagnosis not present

## 2021-11-24 DIAGNOSIS — W19XXXA Unspecified fall, initial encounter: Secondary | ICD-10-CM | POA: Diagnosis not present

## 2021-11-24 DIAGNOSIS — Z743 Need for continuous supervision: Secondary | ICD-10-CM | POA: Diagnosis not present

## 2021-11-29 ENCOUNTER — Other Ambulatory Visit: Payer: Self-pay

## 2021-11-29 ENCOUNTER — Other Ambulatory Visit (HOSPITAL_COMMUNITY): Payer: Self-pay

## 2021-11-29 ENCOUNTER — Encounter: Payer: Self-pay | Admitting: Family Medicine

## 2021-11-29 ENCOUNTER — Telehealth: Payer: 59 | Admitting: Family Medicine

## 2021-11-29 DIAGNOSIS — G7111 Myotonic muscular dystrophy: Secondary | ICD-10-CM

## 2021-11-29 DIAGNOSIS — F332 Major depressive disorder, recurrent severe without psychotic features: Secondary | ICD-10-CM

## 2021-11-29 DIAGNOSIS — R5381 Other malaise: Secondary | ICD-10-CM | POA: Diagnosis not present

## 2021-11-29 DIAGNOSIS — E039 Hypothyroidism, unspecified: Secondary | ICD-10-CM | POA: Diagnosis not present

## 2021-11-29 MED ORDER — BUPROPION HCL ER (XL) 150 MG PO TB24
150.0000 mg | ORAL_TABLET | Freq: Every day | ORAL | 1 refills | Status: DC
  Filled 2021-11-29: qty 30, 30d supply, fill #0
  Filled 2022-01-03: qty 30, 30d supply, fill #1

## 2021-11-29 NOTE — Progress Notes (Signed)
Virtual Visit via Video Note  I connected with [name] and his wife [name] on 11/29/21 at  1:30 PM EST by a video enabled telemedicine application and verified that I am speaking with the correct person using two identifiers.  Location patient: home, [place] Location provider:work or home office Persons participating in the virtual visit: patient, provider  I discussed the limitations of evaluation and management by telemedicine and the availability of in person appointments. The patient expressed understanding and agreed to proceed.  HPI: 60 y/o male being seen for f/u multiple chronic medical problems. He has progressive generalized weakness and immobility d/t myotonic dystrophy.  Additional problems:  hypothyroidism, esophageal dysmotility, hx of aspiration, OSA, excessive daytime somnolence, ADD, and recurrent MDD. I last saw him 3 mo ago. A/P as of that visit: "1) Myotonic muscular dystrophy->progressive generalized weakness.  Impaired mobility, unsteady gait. He refuses any further PT/OT. He is well cared for by his wife and son.   2) Esophageal dysmotility, high aspiration risk: no signif problems of late. He does take glycopyrrolate to minimize oropharyngeal secretions.  Cont omeprazole to minimize gastric acid.   3) Recurrent MDD: he is some improved on fluoxetine 60mg  qd. Continue this.   4) Hypothyroidism: supposed to be taking 137mcg 6 d/week but compliance has not been very good per wife.   TSH was a little up 01/2021 but we elected not to change dose. TSH during hospitalization 04/2021 was a little low->no dose change was made. Recheck TSH today.   5) Adult ADD and excessive daytime somnolence. Cont adderall xr 30mg  +10mg  qAM. I did electronic rx's for these meds today for each of the next 3 months.  Appropriate fill on/after date was noted on each rx."  INTERIM HX: [name] is still struggling with depression.  He is better than several months ago--seems to have been helped by the  increasing fluoxetine to 60 mg dose. His wife says he still makes statements like he would rather be dead. Palliative care came for 1 visit and have been unable to follow-up.  Still with the same trouble handling secretions but sounds like has not had many episodes of excessive coughing or shortness of breath.  He did have 1 day of feeling feverish and having lots of cough shortly after Thanksgiving but this only lasted 1 day and completely resolved.  PMP AWARE reviewed today: most recent rx for adderall xr 30 mg and 10mg  were filled 11/22/29, # 30 of each, rx by me. No red flags.  ROS: See pertinent positives and negatives per HPI.  Past Medical History:  Diagnosis Date   Adult ADHD    Aspiration pneumonitis ([place]) 04/2017, 08/2017   Recurrent   Chronic respiratory failure ([place])    As of 04/2018, Dr. Chase Caller suspects hypercapnic RF--but [name] normal.  May eventually need nocturnal noninvasive ventilation.  Ultimately, if progresses enough pt will need trach.   Constipation, chronic    Has caused irritative urinary/bladder symptoms in the past.   Depression    Dizziness 06/2011+   Noncontrast CT and noncontrast MRI both negative 06/2011 at [facility] ED visit.   Elevated transaminase level    Liver biopsy revealed fatty liver   Erectile dysfunction    Esophageal dysmotility    Barium swallow w/out stricture 04/2021. Pt is aspirating   Facial fracture ([place]) 06/2011   Nondisplaced bilateral [place] I fracture: no surgery required. (ENT, Dr. Wilburn Cornelia)    Fatty liver disease, nonalcoholic    GERD (gastroesophageal reflux disease)  History of thyroid cancer 2010   Medullary carcinoma of thyroid. Thyroidectomy 08/2009 (Dr. Aida Puffer at [facility]).  Needs periodic serum calcitonin monitoring.   Hyperlipidemia    Hypothyroidism    LVH (left ventricular hypertrophy)    by EKG criteria 06/2011.  Hx of heart murmur--Followed by cardiology at [facility]: EF's have been "stable" per cardio office note 12/29/10.   His echo 12/2012 showed normal LV size and wall thickness but EF 45-50% and global LV hypokinesis was found.   Myotonic dystrophy ([place]) Dx'd 1996   Progressive muscle weakness and swallowing dysfunction.  Initial dx by Dr. Jannifer Franklin, neurologist, but subsequent annual specialist f/u has been with Dr. Ladon Applebaum [facility]  Progressive debilitation--PT referral 04/2017, pt quit PT 05/2017.   Nephrolithiasis    Obesity, Class I, BMI 30-34.9    OSA (obstructive sleep apnea)    SEVERE 08/2019 sleep study). CPAP to be initiated as of 04/18/20 pulm (Dr. Annamaria Boots). Bipap initiated in hosp 04/2021, home on this as well.   Pulmonary nodule, right 05/2017   RUL, 7 mm sub solid nodule.  Resolution noted on f/u CT 08/2017.    Past Surgical History:  Procedure Laterality Date   COLONOSCOPY  09/13/2007   normal (Dr. Deatra Ina).   EXTRACORPOREAL SHOCK WAVE LITHOTRIPSY     Home sleep study  08/31/2019   Severe OSA   THYROIDECTOMY  08/2009   for medullary thyroid cancer (Dr. Conley Canal at [facility] follows him for this).   TRANSTHORACIC ECHOCARDIOGRAM  12/2012; 09/2017   2014-Normal LV size and wall thickness, LV EF 45-50%, with global hypokinesis of LV.  2018- EF 50-55%, normal wall motion, grd I DD, valves normal.     Current Outpatient Medications:    acetaminophen (TYLENOL) 500 MG tablet, Take 1,000 mg by mouth at bedtime., Disp: , Rfl:    amphetamine-dextroamphetamine (ADDERALL XR) 10 MG 24 hr capsule, TAKE 1 CAPSULE BY MOUTH ONCE DAILY (TO BE TAKEN WITH 30 MG XR CAPS), Disp: 30 capsule, Rfl: 0   amphetamine-dextroamphetamine (ADDERALL XR) 30 MG 24 hr capsule, TAKE 1 CAPSULE BY MOUTH ONCE DAILY, Disp: 30 capsule, Rfl: 0   clotrimazole-betamethasone (LOTRISONE) cream, APPLY TO THE AFFECTED AREA(S) TWICE DAILY AS NEEDED (Patient taking differently: Apply 1 application topically 2 (two) times daily as needed (to affected areas).), Disp: 45 g, Rfl: 1   FLUoxetine (PROZAC) 20 MG tablet, Take 3 tablets (60 mg total) by mouth  daily., Disp: 90 tablet, Rfl: 6   glycopyrrolate (ROBINUL) 2 MG tablet, TAKE 1/2 TO 1 TABLET BY MOUTH TWO TIMES DAILY AS NEEDED FOR EXCESSIVE ORAL SECRETIONS (Patient taking differently: Take 1-2 mg by mouth See admin instructions. Take 2 mg by mouth at bedtime and an additional 1-2 mg once a day as needed FOR EXCESSIVE ORAL SECRETIONS), Disp: 60 tablet, Rfl: 6   levothyroxine (SYNTHROID) 150 MCG tablet, Take 1 tablet (150 mcg total) by mouth daily before breakfast., Disp: 84 tablet, Rfl: 1   Melatonin 10 MG CAPS, Take 10 mg by mouth at bedtime., Disp: , Rfl:    omeprazole (PRILOSEC) 40 MG capsule, TAKE 1 CAPSULE BY MOUTH ONCE DAILY, Disp: 90 capsule, Rfl: 2   traZODone (DESYREL) 100 MG tablet, Take 2 tablets by mouth daily at bedtime. (Patient taking differently: Take 200 mg by mouth at bedtime.), Disp: 180 tablet, Rfl: 3  EXAM:  VITALS per patient if applicable:  Vitals with BMI 08/31/2021 07/27/2021 05/31/2021  Height - - 5\' 4"   Weight 234 lbs - 224 lbs  BMI - -  62.26  Systolic 333 545 625  Diastolic 76 59 69  Pulse 86 88 87    GENERAL: alert, oriented, appears well and in no acute distress  HEENT: atraumatic, conjunttiva clear, no obvious abnormalities on inspection of external nose and ears  NECK: normal movements of the head and neck  LUNGS: on inspection no signs of respiratory distress, breathing rate appears normal, no obvious gross SOB, gasping or wheezing  CV: no obvious cyanosis  MS: moves all visible extremities without noticeable abnormality  PSYCH/NEURO: pleasant and cooperative, no obvious depression or anxiety, speech and thought processing grossly intact  LABS: none today  Lab Results  Component Value Date   TSH 1.10 08/31/2021   Lab Results  Component Value Date   WBC 3.7 (L) 05/19/2021   HGB 12.7 (L) 05/19/2021   HCT 40.1 05/19/2021   MCV 94.6 05/19/2021   PLT 174 05/19/2021   Lab Results  Component Value Date   CREATININE 0.84 08/31/2021   BUN 11  08/31/2021   NA 140 08/31/2021   K 3.8 08/31/2021   CL 100 08/31/2021   CO2 29 08/31/2021   Lab Results  Component Value Date   ALT 21 05/19/2021   AST 19 05/19/2021   ALKPHOS 41 05/19/2021   BILITOT 0.7 05/19/2021   Lab Results  Component Value Date   CHOL 262 (H) 01/26/2021   Lab Results  Component Value Date   HDL 85.20 01/26/2021   Lab Results  Component Value Date   LDLCALC 148 (H) 01/26/2021   Lab Results  Component Value Date   TRIG 142.0 01/26/2021   Lab Results  Component Value Date   CHOLHDL 3 01/26/2021   Lab Results  Component Value Date   PSA 0.35 08/02/2019   PSA 0.38 07/17/2018   PSA 0.57 08/15/2012   ASSESSMENT AND PLAN:  Discussed the following assessment and plan:  #1 myotonic dystrophy.  Ongoing progressive decline in strength and functioning, esophageal dysmotility, chronic generalized weakness and fatigue as well as excessive daytime sleepiness.   mpaired mobility, unsteady gait. He refuses any further PT/OT. He is well cared for by his wife. Palliative care is on board but needs to follow-up.  He essentially refuses to wear CPAP due to discomfort/anxiety. Glycopyrrolate for excessive oral secretions seems to help some.  Omeprazole 40 mg a day as well. Adderall does help some with increasing concentration/focus, and energy.  2.  Major depressive disorder, recurrent.  He is a little bit improved on the 60 mg fluoxetine and I do want him to stay on this.  We will add Wellbutrin 150 mg XL per day.  We will back off on his Adderall little bit to 30 mg XR a day (d/c 10mg  xr adderall for now).  No new rx's for adderall were needed today.   3. Hypothyroidism: supposed to be taking 122mcg 6 d/week but compliance has historically not been so good.  Last TSH 3 mo ago 1.1. Plan recheck TSH 3 mo.  I discussed the assessment and treatment plan with the patient. The patient was provided an opportunity to ask questions and all were answered. The patient  agreed with the plan and demonstrated an understanding of the instructions.    Signed:  Crissie Sickles, MD           11/29/2021

## 2021-12-02 ENCOUNTER — Other Ambulatory Visit (HOSPITAL_COMMUNITY): Payer: Self-pay

## 2021-12-27 ENCOUNTER — Ambulatory Visit: Payer: 59 | Attending: Internal Medicine

## 2021-12-27 ENCOUNTER — Other Ambulatory Visit (HOSPITAL_BASED_OUTPATIENT_CLINIC_OR_DEPARTMENT_OTHER): Payer: Self-pay

## 2021-12-27 DIAGNOSIS — Z23 Encounter for immunization: Secondary | ICD-10-CM

## 2021-12-27 MED ORDER — FLUARIX QUADRIVALENT 0.5 ML IM SUSY
PREFILLED_SYRINGE | INTRAMUSCULAR | 0 refills | Status: DC
  Filled 2021-12-27: qty 0.5, 1d supply, fill #0

## 2021-12-27 NOTE — Progress Notes (Signed)
° °  Covid-19 Vaccination Clinic  Name:  [name]    MRN: [id number] DOB: 1961/12/01  12/27/2021  [name] was observed post Covid-19 immunization for 15 minutes without incident. He was provided with Vaccine Information Sheet and instruction to access the V-Safe system.   [name] was instructed to call 911 with any severe reactions post vaccine: Difficulty breathing  Swelling of face and throat  A fast heartbeat  A bad rash all over body  Dizziness and weakness   Immunizations Administered     Name Date Dose VIS Date Route   Pfizer Covid-19 Vaccine Bivalent Booster 12/27/2021  2:50 PM 0.3 mL 08/25/2021 Intramuscular   Manufacturer: [employer]   Lot: [id number]   [place]: [phone]

## 2021-12-30 ENCOUNTER — Other Ambulatory Visit (HOSPITAL_COMMUNITY): Payer: Self-pay

## 2021-12-30 ENCOUNTER — Other Ambulatory Visit: Payer: Self-pay | Admitting: Family Medicine

## 2021-12-30 ENCOUNTER — Other Ambulatory Visit (HOSPITAL_BASED_OUTPATIENT_CLINIC_OR_DEPARTMENT_OTHER): Payer: Self-pay

## 2021-12-30 MED ORDER — AMPHETAMINE-DEXTROAMPHET ER 30 MG PO CP24
ORAL_CAPSULE | Freq: Every day | ORAL | 0 refills | Status: DC
  Filled 2021-12-30: qty 30, 30d supply, fill #0

## 2021-12-30 MED ORDER — PFIZER COVID-19 VAC BIVALENT 30 MCG/0.3ML IM SUSP
INTRAMUSCULAR | 0 refills | Status: DC
  Filled 2021-12-30: qty 0.3, 1d supply, fill #0

## 2021-12-30 NOTE — Telephone Encounter (Signed)
Requesting:adderrall Contract:01/26/21 UDS:n/a  Last Visit:11/29/21  Next Visit:  Last Refill:09/27/21 (30,0)  Please Advise

## 2022-01-03 ENCOUNTER — Other Ambulatory Visit (HOSPITAL_COMMUNITY): Payer: Self-pay

## 2022-01-10 ENCOUNTER — Other Ambulatory Visit (HOSPITAL_COMMUNITY): Payer: Self-pay

## 2022-01-26 IMAGING — RF DG ESOPHAGUS
13 of 19 series · 13 of 24 positions shown · non-contrast
Comparison: Swallow function from May 13, 2021.

CLINICAL DATA: Difficulty swallowing.  Question of stricture.

EXAM:
ESOPHOGRAM/BARIUM SWALLOW
TECHNIQUE: Single contrast examination was performed using  thin barium.
FLUOROSCOPY TIME:  Fluoroscopy Time:  2 minutes off seconds
Radiation Exposure Index (if provided by the fluoroscopic device):
24.5
Number of Acquired Spot Images: 0

[Series 1: cp_standard · 0.34mm/px · 1 of 49 frames shown (1 of 13)]
[frame 8/49]
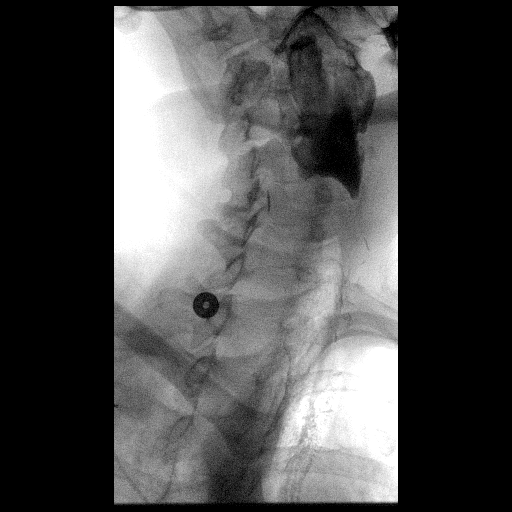

[Series 2: cp_standard · 0.35mm/px · 1 of 32 frames shown (2 of 13)]
[frame 17/32]
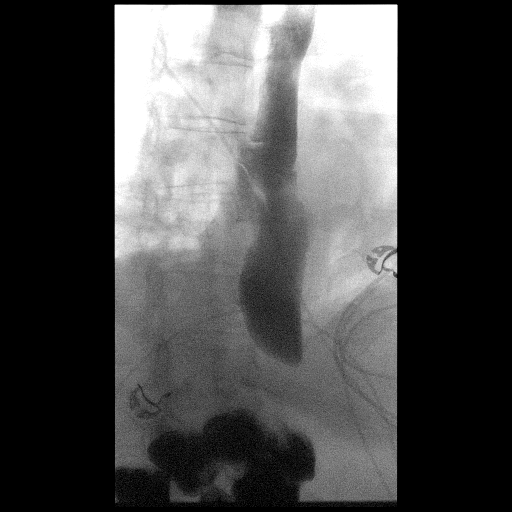

[Series 3: cp_standard · 0.35mm/px · 1 of 24 frames shown (3 of 13)]
[frame 23/24]
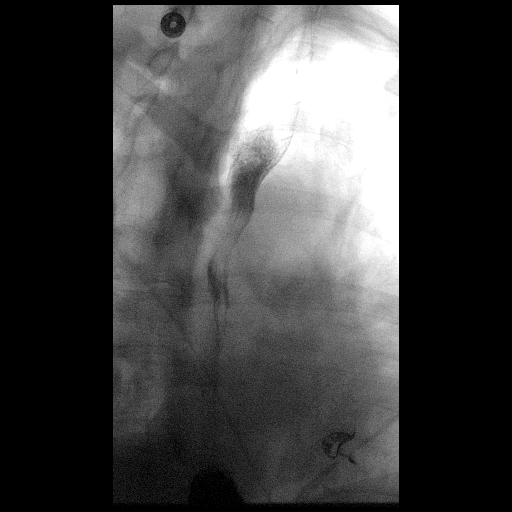

[Series 5: cp_standard · 0.36mm/px · 1 of 33 frames shown (4 of 13)]
[frame 17/33]
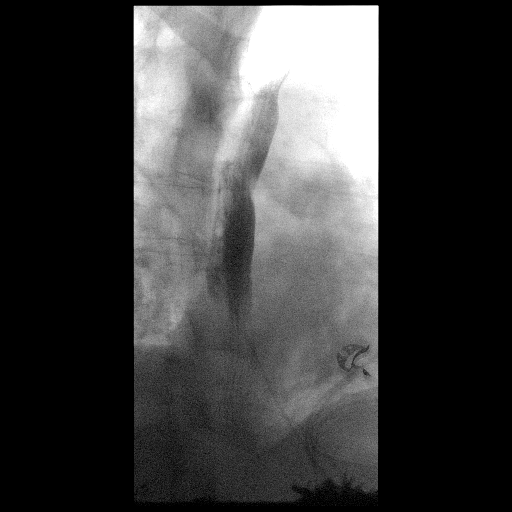

[Series 6: cp_standard · 0.35mm/px · 1 of 81 frames shown (5 of 13)]
[frame 69/81]
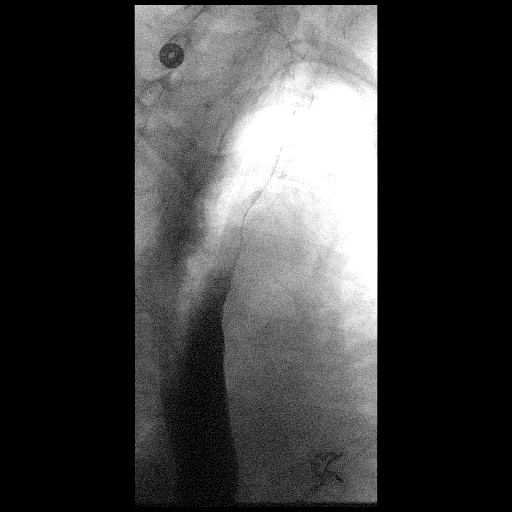

[Series 8: cp_standard · 0.36mm/px · 1 of 15 frames shown (6 of 13)]
[frame 8/15]
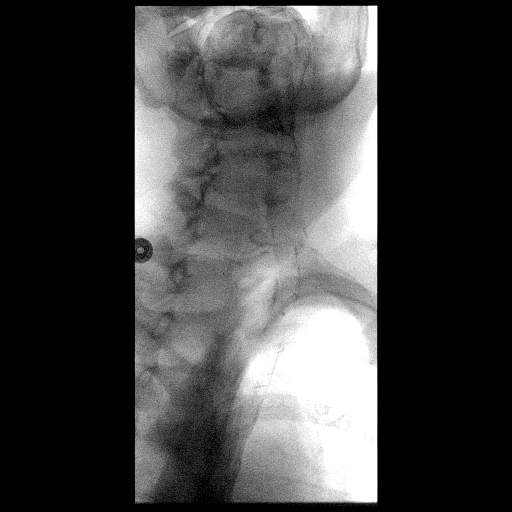

[Series 9: cp_standard · 0.36mm/px · 1 of 10 frames shown (7 of 13)]
[frame 9/10]
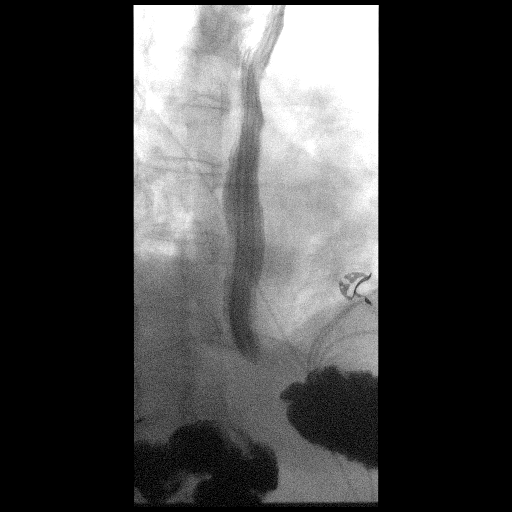

[Series 10: cp_standard · 0.37mm/px · 1 of 7 frames shown (8 of 13)]
[frame 6/7]
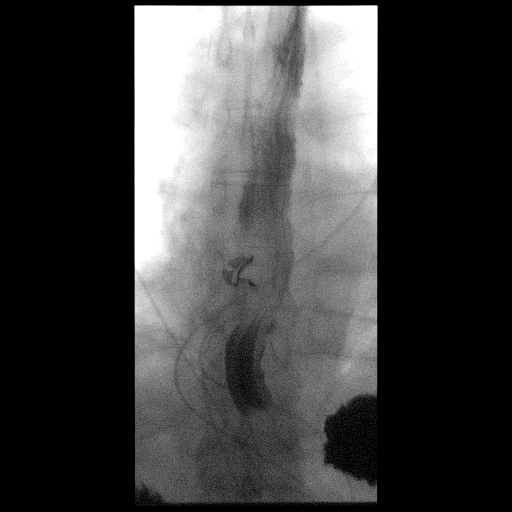

[Series 12: cp_standard · 0.18mm/px · 1 of 1 slices shown (9 of 13)]
[im 1/1]
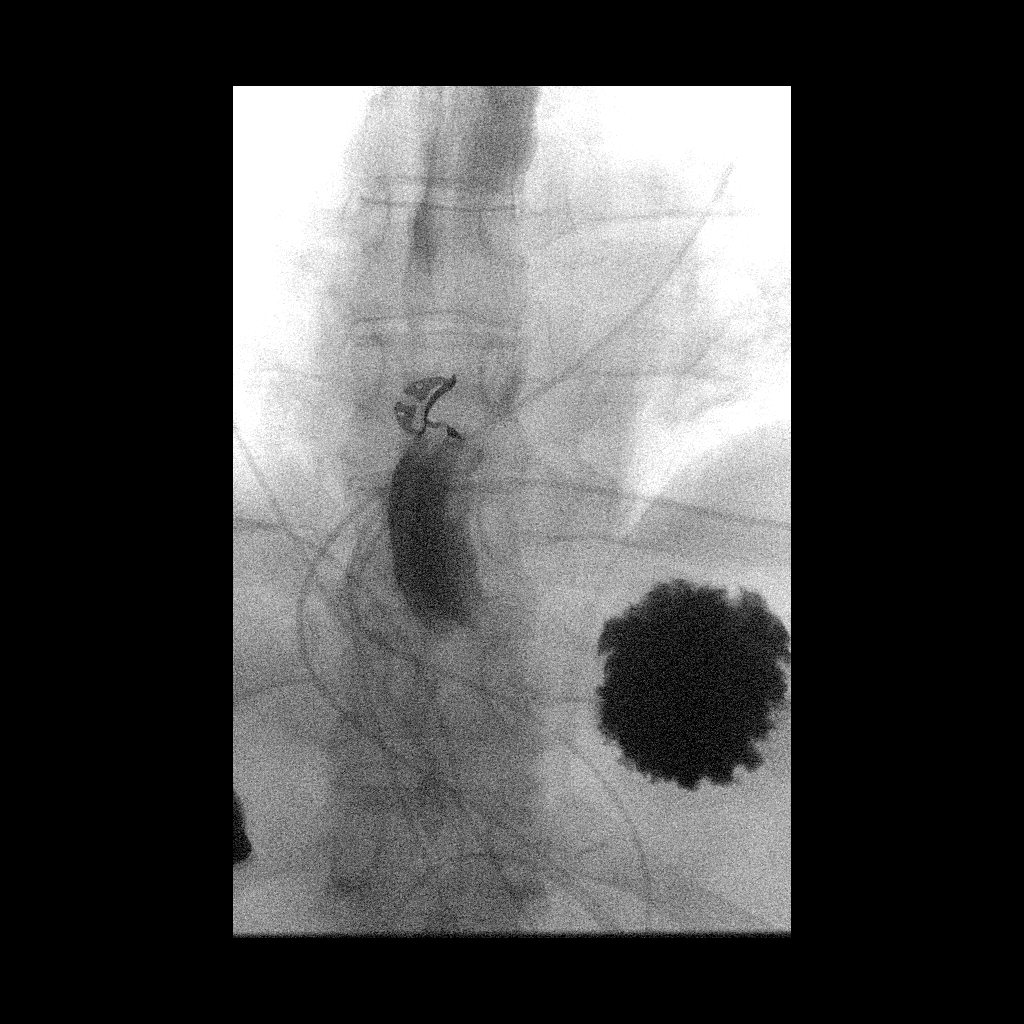

[Series 14: cp_standard · 0.36mm/px · 1 of 34 frames shown (10 of 13)]
[frame 6/34]
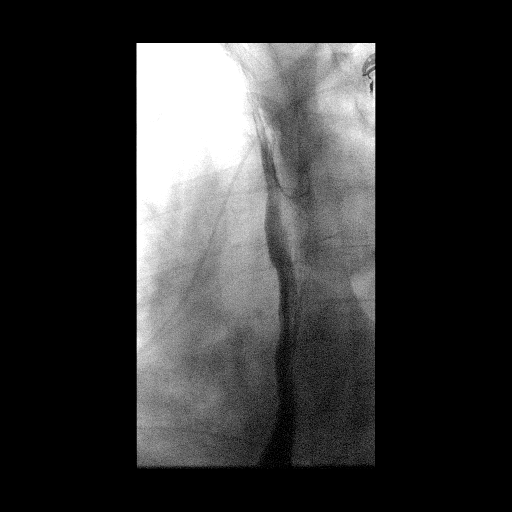

[Series 17: cp_standard · 0.18mm/px · 1 of 1 slices shown (11 of 13)]
[im 1/1]
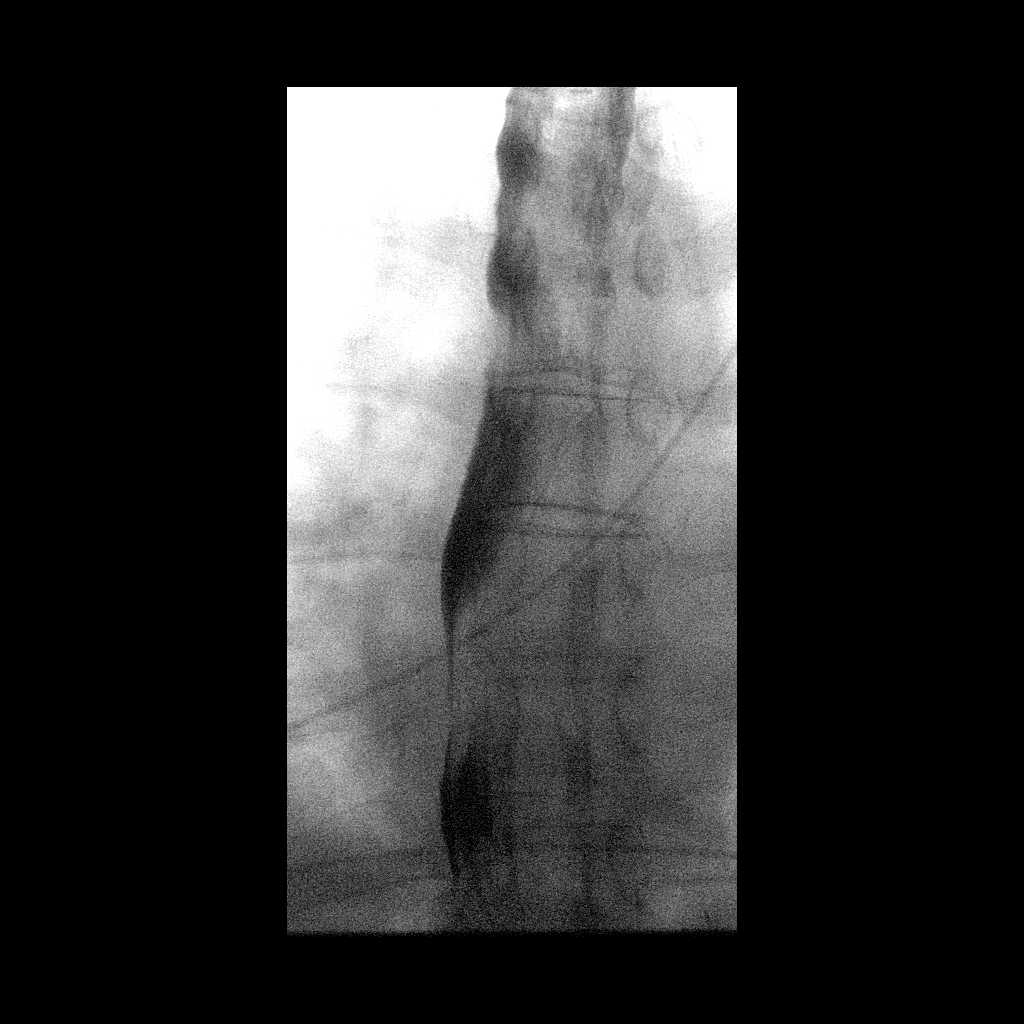

[Series 19: cp_standard · 0.35mm/px · 1 of 14 frames shown (12 of 13)]
[frame 8/14]
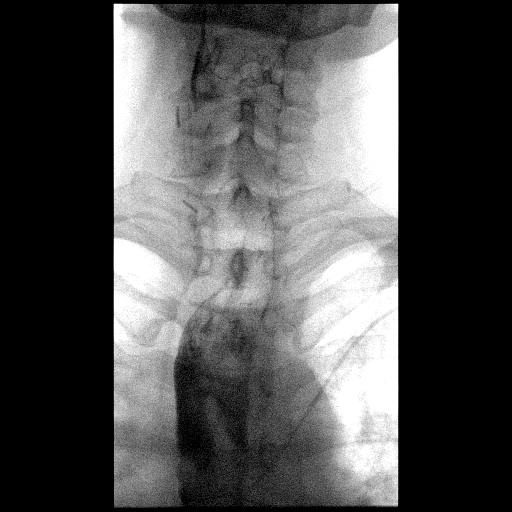

[Series 20: cp_standard · 0.36mm/px · 1 of 6 frames shown (13 of 13)]
[frame 6/6]
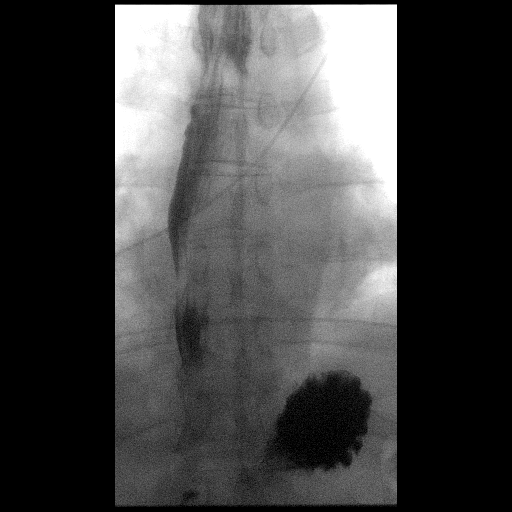

[13 of 24 positions shown; findings below may reference images not displayed]

FINDINGS: Only small volumes could be swallowed due to aspiration risk. There
was laryngeal penetration and small volume silent aspiration noted
during the examination with thin barium. Cough with clearing.

Esophagus patulous and with signs of dysmotility. Contrast passes
into the stomach from the esophagus. Limited primary wave was noted
but patient not position in standard fashion due to debilitated
state assessment of motility. No strong tertiary peristaltic
activity was demonstrated. No gross evidence of w gastroesophageal
reflux was visualized on the evaluation. Stasis in the esophagus was
noted during the study there was no gross sign of stricture. Tablet
not administered due to altered mental status and risk for
aspiration.

There was reflux of contrast from the distal into the more proximal
esophagus during observation at the into the study with considerable
stasis.
IMPRESSION: Signs of dysmotility without gross signs of stricture on limited
assessment.

Laryngeal penetration with small volume silent aspiration. Due to
this finding further evaluation with thick barium or barium tablet
was not performed.

## 2022-02-07 ENCOUNTER — Other Ambulatory Visit (HOSPITAL_COMMUNITY): Payer: Self-pay

## 2022-02-07 ENCOUNTER — Other Ambulatory Visit: Payer: Self-pay | Admitting: Family Medicine

## 2022-02-07 MED ORDER — BUPROPION HCL ER (XL) 150 MG PO TB24
150.0000 mg | ORAL_TABLET | Freq: Every day | ORAL | 0 refills | Status: DC
  Filled 2022-02-07: qty 30, 30d supply, fill #0

## 2022-02-08 NOTE — Telephone Encounter (Signed)
Requesting: adderall Contract: 01/26/21 UDS:N/A Last Visit: 08/31/21 Next Visit: advised to f/u 3 months Last Refill: 12/30/21(30,0)  Please Advise. Med pending

## 2022-02-09 ENCOUNTER — Other Ambulatory Visit (HOSPITAL_COMMUNITY): Payer: Self-pay

## 2022-02-09 MED ORDER — AMPHETAMINE-DEXTROAMPHET ER 30 MG PO CP24
ORAL_CAPSULE | Freq: Every day | ORAL | 0 refills | Status: DC
  Filled 2022-02-09: qty 30, 30d supply, fill #0

## 2022-02-09 NOTE — Telephone Encounter (Signed)
Pt advised refill sent, okay to LVM per [facility]

## 2022-02-21 ENCOUNTER — Other Ambulatory Visit (HOSPITAL_COMMUNITY): Payer: Self-pay

## 2022-03-15 ENCOUNTER — Other Ambulatory Visit (HOSPITAL_COMMUNITY): Payer: Self-pay

## 2022-03-15 ENCOUNTER — Other Ambulatory Visit: Payer: Self-pay | Admitting: Family Medicine

## 2022-03-16 ENCOUNTER — Other Ambulatory Visit (HOSPITAL_COMMUNITY): Payer: Self-pay

## 2022-03-16 MED ORDER — AMPHETAMINE-DEXTROAMPHET ER 30 MG PO CP24
ORAL_CAPSULE | Freq: Every day | ORAL | 0 refills | Status: DC
  Filled 2022-03-16: qty 30, 30d supply, fill #0

## 2022-03-16 MED ORDER — BUPROPION HCL ER (XL) 150 MG PO TB24
150.0000 mg | ORAL_TABLET | Freq: Every day | ORAL | 0 refills | Status: DC
  Filled 2022-03-16: qty 30, 30d supply, fill #0

## 2022-03-16 NOTE — Telephone Encounter (Signed)
Requesting: adderall ?Contract: 01/26/21 ?UDS: N/A ?Last Visit: 11/29/21 ?Next Visit:advised to f/u 3 mo  ?Last Refill: 02/09/22(30,0) ? ?Please Advise. Med pending ?

## 2022-04-25 ENCOUNTER — Telehealth: Payer: Self-pay

## 2022-04-25 NOTE — Telephone Encounter (Signed)
255 pm.  Palliative Care volunteer contacted patient/wife for check in.  They are requesting a home visit.   Phone call made to patient's spouse [name] to schedule a home visit with Palliative Care NP.  No answer.  Message left requesting a call back.  ?

## 2022-04-27 ENCOUNTER — Other Ambulatory Visit: Payer: Self-pay | Admitting: Family Medicine

## 2022-04-28 ENCOUNTER — Encounter: Payer: Self-pay | Admitting: Family Medicine

## 2022-04-28 ENCOUNTER — Other Ambulatory Visit (HOSPITAL_COMMUNITY): Payer: Self-pay

## 2022-04-28 ENCOUNTER — Telehealth (INDEPENDENT_AMBULATORY_CARE_PROVIDER_SITE_OTHER): Payer: 59 | Admitting: Family Medicine

## 2022-04-28 VITALS — BP 117/62 | HR 83

## 2022-04-28 DIAGNOSIS — R5381 Other malaise: Secondary | ICD-10-CM

## 2022-04-28 DIAGNOSIS — F988 Other specified behavioral and emotional disorders with onset usually occurring in childhood and adolescence: Secondary | ICD-10-CM

## 2022-04-28 DIAGNOSIS — G7111 Myotonic muscular dystrophy: Secondary | ICD-10-CM

## 2022-04-28 DIAGNOSIS — F339 Major depressive disorder, recurrent, unspecified: Secondary | ICD-10-CM

## 2022-04-28 DIAGNOSIS — R5382 Chronic fatigue, unspecified: Secondary | ICD-10-CM | POA: Diagnosis not present

## 2022-04-28 MED ORDER — OMEPRAZOLE 40 MG PO CPDR
DELAYED_RELEASE_CAPSULE | Freq: Every day | ORAL | 2 refills | Status: AC
  Filled 2022-04-28: qty 90, 90d supply, fill #0

## 2022-04-28 MED ORDER — GLYCOPYRROLATE 2 MG PO TABS
ORAL_TABLET | ORAL | 6 refills | Status: AC
Start: 2022-04-28 — End: ?
  Filled 2022-04-28: qty 60, 30d supply, fill #0

## 2022-04-28 MED ORDER — RISPERIDONE 0.5 MG PO TABS
0.5000 mg | ORAL_TABLET | Freq: Every day | ORAL | 0 refills | Status: DC
  Filled 2022-04-28: qty 30, 30d supply, fill #0

## 2022-04-28 MED ORDER — BUPROPION HCL ER (XL) 150 MG PO TB24
ORAL_TABLET | ORAL | 0 refills | Status: DC
  Filled 2022-04-28: qty 7, 14d supply, fill #0

## 2022-04-28 MED ORDER — TRAZODONE HCL 100 MG PO TABS: ORAL_TABLET | ORAL | 3 refills | Status: DC

## 2022-04-28 NOTE — Patient Instructions (Signed)
Take risperidone 0.5 mg, start with half tab at bedtime x5 days then increase to 1 whole tab. ? ?Decrease trazodone to half of the 100 mg tab at bedtime. ? ?Take Wellbutrin XL 150 mg tab: 1 tab every other day x7 doses, then stop this medication. ? ?Continue fluoxetine 60 mg/day. ? ?We order psychiatry referral ?

## 2022-04-28 NOTE — Progress Notes (Signed)
Virtual Visit via Video Note ? ?I connected with [name] on 04/28/22 at  4:00 PM EDT by a video enabled telemedicine application and verified that I am speaking with the correct person using two identifiers. ? Location patient: [place] ?Location provider:work or home office ?Persons participating in the virtual visit: patient, provider ? ?I discussed the limitations and requested verbal permission for telemedicine visit. The patient expressed understanding and agreed to proceed. ? ?CC: ?61 year old male seen accompanied by his wife [name] today for 88-monthfollow-up myotonic dystrophy, MDD, ADD, hypothyroidism. ?A/P as of last visit: ?"#1 myotonic dystrophy.  Ongoing progressive decline in strength and functioning, esophageal dysmotility, chronic generalized weakness and fatigue as well as excessive daytime sleepiness.   ?mpaired mobility, unsteady gait. ?He refuses any further PT/OT. ?He is well cared for by his wife. ?Palliative care is on board but needs to follow-up.  He essentially refuses to wear CPAP due to discomfort/anxiety. ?Glycopyrrolate for excessive oral secretions seems to help some.  Omeprazole 40 mg a day as well. ?Adderall does help some with increasing concentration/focus, and energy. ? ?2.  Major depressive disorder, recurrent.  He is a little bit improved on the 60 mg fluoxetine and I do want him to stay on this.  We will add Wellbutrin 150 mg XL per day.  We will back off on his Adderall little bit to 30 mg XR a day (d/c '10mg'$  xr adderall for now).  No new rx's for adderall were needed today. ?  ?3. Hypothyroidism: supposed to be taking 152m 6 d/week but compliance has historically not been so good.  Last TSH 3 mo ago 1.1. ?Plan recheck TSH 3 mo." ? ?INTERIM HX: ?JeMerry Proudontinues to struggle a lot with depression. ?Sometimes has some paranoid thinking, for example he was convinced his wife was trying to sell the house.  Other times he felt like he was going to be thrown out of the house so some in-laws  could move in. ?Hard to tell if actual paranoia versus excessive worry/insecurity. ? ?Severe generalized weakness has not changed. ?He is eating about 1 meal a day.  Not having any aspiration episodes. ?He sleeps most of his day.  Hard to tell how much of the evening/overnight he is awake. ? ?PMP AWARE reviewed today: most recent rx for Adderall XR was filled 03/16/2022, #30, rx by me. ?No red flags. ? ?Immunization History  ?Administered Date(s) Administered  ? Influenza Inj Mdck Quad With Preservative 12/05/2017  ? Influenza Split 12/07/2011, 09/13/2012  ? Influenza,inj,Quad PF,6+ Mos 09/05/2013, 09/08/2014, 11/18/2015, 10/22/2018, 12/27/2021  ? Influenza-Unspecified 10/22/2020  ? PFIZER(Purple Top)SARS-COV-2 Vaccination 03/23/2020, 04/15/2020, 12/11/2020  ? PfPension scheme manager222yr up 12/27/2021  ? Pneumococcal Polysaccharide-23 12/26/2008, 06/05/2015  ? Tdap 07/08/2011, 07/13/2020  ? Zoster Recombinat (Shingrix) 07/17/2018, 10/22/2018  ? ? ? ?ROS: See pertinent positives and negatives per HPI. ? ?Past Medical History:  ?Diagnosis Date  ? Adult ADHD   ? Aspiration pneumonitis ([facility]/2018, 08/2017  ? Recurrent  ? Chronic respiratory failure ([facility] ? As of 04/2018, Dr. RamChase Callerspects hypercapnic RF--but [name] normal.  May eventually need nocturnal noninvasive ventilation.  Ultimately, if progresses enough pt will need trach.  ? Constipation, chronic   ? Has caused irritative urinary/bladder symptoms in the past.  ? Depression   ? Dizziness 06/2011+  ? Noncontrast CT and noncontrast [facility] both [facility].  ? Elevated transaminase level   ? Liver biopsy revealed fatty liver  ?  Erectile dysfunction   ? Esophageal dysmotility   ? Barium swallow w/out stricture 04/2021. Pt is aspirating  ? Facial fracture ([place]) 06/2011  ? Nondisplaced bilateral [place] I fracture: no surgery required. (ENT, Dr. Wilburn Cornelia)   ? Fatty liver disease, nonalcoholic   ? GERD (gastroesophageal reflux  disease)   ? History of thyroid cancer 2010  ? Medullary carcinoma of thyroid. Thyroidectomy 08/2009 (Dr. Aida Puffer at [facility]).  Needs periodic serum calcitonin monitoring.  ? Hyperlipidemia   ? Hypothyroidism   ? LVH (left ventricular hypertrophy)   ? by EKG criteria 06/2011.  Hx of heart murmur--Followed by cardiology at [facility]: EF's have been "stable" per cardio office note 12/29/10.  His echo 12/2012 showed normal LV size and wall thickness but EF 45-50% and global LV hypokinesis was found.  ? Myotonic dystrophy ([place]) Dx'd 1996  ? Progressive muscle weakness and swallowing dysfunction.  Initial dx by Dr. Jannifer Franklin, neurologist, but subsequent annual specialist f/u has been with Dr. Ladon Applebaum [facility].  Progressive debilitation--PT referral 04/2017, pt quit PT 05/2017.  ? Nephrolithiasis   ? Obesity, Class I, BMI 30-34.9   ? OSA (obstructive sleep apnea)   ? SEVERE 08/2019 sleep study). CPAP to be initiated as of 04/18/20 pulm (Dr. Annamaria Boots). Bipap initiated in hosp 04/2021, home on this as well.  ? Pulmonary nodule, right 05/2017  ? RUL, 7 mm sub solid nodule.  Resolution noted on f/u CT 08/2017.  ? ? ?Past Surgical History:  ?Procedure Laterality Date  ? COLONOSCOPY  09/13/2007  ? normal (Dr. Deatra Ina).  ? EXTRACORPOREAL SHOCK WAVE LITHOTRIPSY    ? Home sleep study  08/31/2019  ? Severe OSA  ? THYROIDECTOMY  08/2009  ? for medullary thyroid cancer (Dr. Conley Canal at [facility] follows him for this).  ? TRANSTHORACIC ECHOCARDIOGRAM  12/2012; 09/2017  ? 2014-Normal LV size and wall thickness, LV EF 45-50%, with global hypokinesis of LV.  2018- EF 50-55%, normal wall motion, grd I DD, valves normal.  ? ? ? ?Current Outpatient Medications:  ?  acetaminophen (TYLENOL) 500 MG tablet, Take 1,000 mg by mouth at bedtime., Disp: , Rfl:  ?  amphetamine-dextroamphetamine (ADDERALL XR) 10 MG 24 hr capsule, TAKE 1 CAPSULE BY MOUTH ONCE DAILY (TO BE TAKEN WITH 30 MG XR CAPS), Disp: 30 capsule, Rfl: 0 ?  amphetamine-dextroamphetamine (ADDERALL XR) 30 MG  24 hr capsule, TAKE 1 CAPSULE BY MOUTH ONCE DAILY, Disp: 30 capsule, Rfl: 0 ?  buPROPion (WELLBUTRIN XL) 150 MG 24 hr tablet, Take 1 tablet (150 mg total) by mouth daily., Disp: 30 tablet, Rfl: 0 ?  clotrimazole-betamethasone (LOTRISONE) cream, APPLY TO THE AFFECTED AREA(S) TWICE DAILY AS NEEDED (Patient taking differently: Apply 1 application topically 2 (two) times daily as needed (to affected areas).), Disp: 45 g, Rfl: 1 ?  FLUoxetine (PROZAC) 20 MG tablet, Take 3 tablets (60 mg total) by mouth daily., Disp: 90 tablet, Rfl: 6 ?  glycopyrrolate (ROBINUL) 2 MG tablet, TAKE 1/2 TO 1 TABLET BY MOUTH TWO TIMES DAILY AS NEEDED FOR EXCESSIVE ORAL SECRETIONS (Patient taking differently: Take 1-2 mg by mouth See admin instructions. Take 2 mg by mouth at bedtime and an additional 1-2 mg once a day as needed FOR EXCESSIVE ORAL SECRETIONS), Disp: 60 tablet, Rfl: 6 ?  influenza vac split quadrivalent PF (FLUARIX QUADRIVALENT) 0.5 ML injection, Inject into the muscle., Disp: 0.5 mL, Rfl: 0 ?  levothyroxine (SYNTHROID) 150 MCG tablet, Take 1 tablet (150 mcg total) by mouth daily before breakfast., Disp: 84  tablet, Rfl: 1 ?  Melatonin 10 MG CAPS, Take 10 mg by mouth at bedtime., Disp: , Rfl:  ?  omeprazole (PRILOSEC) 40 MG capsule, TAKE 1 CAPSULE BY MOUTH ONCE DAILY, Disp: 90 capsule, Rfl: 2 ?  traZODone (DESYREL) 100 MG tablet, Take 2 tablets by mouth daily at bedtime. (Patient taking differently: Take 200 mg by mouth at bedtime.), Disp: 180 tablet, Rfl: 3 ? ?EXAM: ? ?VITALS per patient if applicable: ? ?  0/01/7740  ?  9:14 AM 07/27/2021  ? 11:19 AM 05/31/2021  ? 11:11 AM  ?Vitals with BMI  ?Height   '5\' 4"'$   ?Weight 234 lbs  224 lbs  ?BMI   38.43  ?Systolic 287 867 672  ?Diastolic 76 59 69  ?Pulse 86 88 87  ? ?GENERAL: alert, oriented, appears fatigued.  He is in no acute distress ? ?HEENT: atraumatic, conjunttiva clear, no obvious abnormalities on inspection of external nose and ears ? ?LUNGS: on inspection no signs of respiratory  distress, breathing rate appears normal, no obvious gross SOB, gasping or wheezing ? ?CV: no obvious cyanosis ? ?PSYCH/NEURO: Chronic mild slurring of speech ? ?Lab Results  ?Component Value Date  ? TSH

## 2022-04-30 ENCOUNTER — Other Ambulatory Visit (HOSPITAL_COMMUNITY): Payer: Self-pay

## 2022-05-03 ENCOUNTER — Other Ambulatory Visit: Payer: Self-pay | Admitting: Family Medicine

## 2022-05-03 ENCOUNTER — Other Ambulatory Visit (HOSPITAL_COMMUNITY): Payer: Self-pay

## 2022-05-03 MED ORDER — LEVOTHYROXINE SODIUM 150 MCG PO TABS
150.0000 ug | ORAL_TABLET | Freq: Every day | ORAL | 1 refills | Status: AC
  Filled 2022-05-03: qty 84, 84d supply, fill #0

## 2022-05-03 MED ORDER — AMPHETAMINE-DEXTROAMPHET ER 30 MG PO CP24
ORAL_CAPSULE | Freq: Every day | ORAL | 0 refills | Status: DC
Start: 2022-05-03 — End: 2022-06-01
  Filled 2022-05-03: qty 30, 30d supply, fill #0

## 2022-05-03 NOTE — Telephone Encounter (Signed)
Please advise on refill. Last TSH 1 year ago. ?

## 2022-05-12 ENCOUNTER — Other Ambulatory Visit: Payer: 59 | Admitting: Family Medicine

## 2022-05-12 ENCOUNTER — Other Ambulatory Visit: Payer: 59

## 2022-05-12 DIAGNOSIS — F32A Depression, unspecified: Secondary | ICD-10-CM | POA: Diagnosis not present

## 2022-05-12 DIAGNOSIS — G7111 Myotonic muscular dystrophy: Secondary | ICD-10-CM | POA: Diagnosis not present

## 2022-05-12 DIAGNOSIS — Z515 Encounter for palliative care: Secondary | ICD-10-CM | POA: Diagnosis not present

## 2022-05-12 DIAGNOSIS — Z789 Other specified health status: Secondary | ICD-10-CM | POA: Diagnosis not present

## 2022-05-12 NOTE — Progress Notes (Signed)
[facility] Palliative Care Consult Note Telephone: [phone]  Fax: [phone]    Date of encounter: 05/12/22 2:37 PM PATIENT NAME: [name] [address] [place] [address]   [phone] (home)  DOB: 11-02-1961 MRN: [id number] PRIMARY CARE PROVIDER:    Tammi Sou, MD,  [address] [place] [address] [phone]  REFERRING PROVIDER:   Tammi Sou, MD [address] [place],  [place] [address] [phone]  RESPONSIBLE PARTY:    Contact Information     Name Relation Home Work [place] Spouse [phone]  [phone]   VASILY, FEDEWA   [phone]        I met face to face with patient and wife in their home. Palliative Care was asked to follow this patient by consultation request of  [name], [name], MD to address advance care planning and complex medical decision making. This is a follow up visit.                                   ASSESSMENT, SYMPTOM MANAGEMENT AND PLAN / RECOMMENDATIONS:   Palliative Care Encounter Next follow up to address goals of care, waiting to see if mood can be improved with counseling. Use scheduled Tylenol to see if will decrease muscle aches.  Myotonic Dystrophy Would benefit from electric wheelchair for mobility around home  Depression, unspecified Continue current antidepressant therapy SW referral to address counseling  Needs assistance with community resources Assistance with motorized wheelchair. Wants to visit son and needs help with airtravel arrangements and assistance for primary caregiver for safe travel. Pt's son works at [employer] in [place] and he would like to visit-SW to look at all available resources to see if there is any assistance with travel and DME to make trip possible.  Advance Care Planning/Goals of Care: Goals include to maximize quality of life and symptom management.  CODE STATUS: Prior listed  code status was partial with allowance of CPR, no intubation but could do limited means with BiPAP, CPAP    Follow up Palliative Care Visit: Palliative care will continue to follow for complex medical decision making, advance care planning, and clarification of goals. Return 2 weeks or prn.   This visit was coded based on medical decision making (MDM).  PPS: 30%  HOSPICE ELIGIBILITY/DIAGNOSIS: TBD  Chief Complaint:  Palliative Care is following for chronic medical management in setting of muscular dystrophy with assistance for advance care planning and defining/refining goals of care.  HISTORY OF PRESENT ILLNESS:  [name] is a 61 y.o. year old male  with myotonic muscular dystrophy.   He also has LVH, right pulmonary nodule, recurrent aspiration pneumonitis with chronic respiratory failure, OSA, oropharyngeal dysphagia and esophageal dysmotility, GERD, chronic constipation, NAFLD, hx of thyroid cancer, hypothyroidism, autonomic dysfunction, HLD, depression, ADD, generalized weakness with falls and dyspnea. Progressive decline over the last several years.  Has some depression. Sleeping sound occasionally. Spouse is not getting as much sleep.  Pt is sleeping a little better since PCP changed medicines but has days and nights reversed.  Pt c/o frequently of aching.   He is more comfortable when he is warmer. Moves some but cannot shift position independently. Has lift in the home.  Eating 1-2 "small" meals per day.  Incontinent of bowel/bladder.  Falls occur when going to transfer. Had fall 2 days  ago and 1 over the weekend. Hard to tell but don't think about weight loss. SOB a little bit when exerting, no CP. No skin breakdown. Spouse has no help at home and works from home.  No nausea and vomiting. Weaning off wellbutrin (taking for 6 more days).  Mood was worse before adding Risperdal. Pt stays primarily in lift chair obtained to help him with getting up and down.  Wife has transport chair  and lift to help get him in and out of bed. She states she cannot lift him on her own and when he is up at night she doesn't get much sleep.  She states prior to addition of Risperdal that he was easily irritable with her.  She states he has expressed a desire to visit their son who works in [employer] in [place] but she does not see how they could make that trip with all the equipment he requires and with just her help. She states a desire to have some form of motorized wheelchair to allow him additional mobility inside the house but doesn't know how they would travel with such a chair.  Advised that this would require a device on the back of the car for carrying the chair.  She would like for him to have some counseling to see if this would also help him with coping.  Pt has become angry before when talking about plans as they used to take an annual trip to the beach because he tells her that she won't try.  Advised both that I could make no promises but I would reach out to the social worker to see if there were any resources that could be tapped to help make any of this travel possible. Likely it would be mutually beneficial for spouse.   History obtained from review of EMR, interview with wife and/or Mr. Peeters.  I reviewed available labs, medications, imaging, studies and related documents from the EMR.  Records reviewed and summarized above.   ROS General: NAD EYES: denies vision changes ENMT: endorses some dysphagia but needs to use straw to aid drinking Cardiovascular: denies chest pain, endorses DOE Pulmonary: denies cough, denies increased SOB Abdomen: endorses fair appetite and constipation, endorses incontinence of bowel GU: denies dysuria, endorses incontinence of urine MSK:  endorses increased weakness and ability to shift position,  2 falls recently during transfer Skin: denies rashes or wounds Neurological: endorses migratory generalized myalgia pain and insomnia Psych: Endorses  depressed mood Heme/lymph/immuno: denies bruises, abnormal bleeding  Physical Exam: Constitutional: NAD General: frail appearing EYES: anicteric sclera, lids intact, no discharge  ENMT: intact hearing, oral mucous membranes moist, dentition intact CV: S1S2, RRR, no LE edema Pulmonary: CTAB, no increased work of breathing, no cough, room air Abdomen: hypo-active BS + 4 quadrants, soft and non tender, no ascites GU: deferred MSK: moves all extremities to small degree, bed or chair bound, some atrophy of distal fingers of bilat hands Skin: warm and dry, no rashes or wounds on visible skin Neuro:  noted generalized weakness,  no cognitive impairment Psych: non-anxious affect, A and O x 3 Hem/lymph/immuno: no widespread bruising   Thank you for the opportunity to participate in the care of Mr. Sabella.  The palliative care team will continue to follow. Please call our office at [phone] if we can be of additional assistance.    A , FNP   COVID-19 PATIENT SCREENING TOOL Asked and negative response unless otherwise noted:   Have you had symptoms of   covid, tested positive or been in contact with someone with symptoms/positive test in the past 5-10 days?  No  

## 2022-05-15 ENCOUNTER — Encounter: Payer: Self-pay | Admitting: Family Medicine

## 2022-05-15 DIAGNOSIS — Z7189 Other specified counseling: Secondary | ICD-10-CM | POA: Insufficient documentation

## 2022-05-15 DIAGNOSIS — Z789 Other specified health status: Secondary | ICD-10-CM | POA: Insufficient documentation

## 2022-05-15 DIAGNOSIS — Z515 Encounter for palliative care: Secondary | ICD-10-CM | POA: Insufficient documentation

## 2022-05-17 ENCOUNTER — Other Ambulatory Visit: Payer: 59

## 2022-05-17 DIAGNOSIS — Z515 Encounter for palliative care: Secondary | ICD-10-CM

## 2022-05-17 NOTE — Progress Notes (Signed)
COMMUNITY PALLIATIVE CARE SW NOTE  PATIENT NAME: [name] DOB: 11/27/1961 MRN: [id number]  PRIMARY CARE PROVIDER: Tammi Sou, MD  RESPONSIBLE PARTY:  Acct ID - Guarantor Home Phone Work Phone Relationship Acct Type  [id number] - Bradly,JEFF* [phone]  Self P/F     [address], [place], [place] [address]   SOCIAL WORK VISIT  PC SW completed a visit with patient at his home. He was present with his wife-Nancy. Patient was in bed. He was awake. He denied pain. Patient was verbal, but his speech was garbled and difficult to understand at time. He indicated that he was not having any pain or discomfort. Patient appeared to be staring up at the ceiling, blank in his eyes and his affect was flat. His wife attempted to keep patient engaged throughout the visit, but she often asked patient to clarify what he was trying to say as she also had difficulty understanding his verbalizations. She shared patient's social and medical history. They are attempting to plan a visit to [place] to visit their only son. SW provided resources where an Transport planner could be rented. SW also provided education regarding the muscular dystrophy clinic ([place]) the resources that are available through the program. SW encouraged [name] to reach out to the clinic's Education officer, museum for additional questions/support. She advised that she is interested in having additional in-home support (caregiving). SW encouraged her to contact patient's insurance to discuss resources. SW also educated wife on grants available through [facility]. [name] advised that she will look into these resources. SW provided social stimulation, supportive counseling and reassurance of support. Patient and his wife remain open to ongoing palliative/SW support. SW scheduled a follow-up visit with patient for 05/30/22 @ 1:00 pm.    Katheren Puller, LCSW

## 2022-05-18 NOTE — Progress Notes (Signed)
COMMUNITY PALLIATIVE CARE SW NOTE  PATIENT NAME: [name] DOB: 1961/04/07 MRN: [id number]  PRIMARY CARE PROVIDER: Tammi Sou, MD  RESPONSIBLE PARTY:  Acct ID - Guarantor Home Phone Work Phone Relationship Acct Type  [id number] - Kouba,JEFF* [phone]  Self P/F     [address], [place], [place] [address]   SOCIAL WORK ENCOUNTER   PC SW completed face-to-face visit with patient at his home. He was present with his wife Robert   Liese Dizdarevic [name], [place]

## 2022-05-30 ENCOUNTER — Telehealth: Payer: Self-pay

## 2022-05-30 NOTE — Telephone Encounter (Signed)
Volunteer check in call made for palliative care, no answer 

## 2022-05-31 ENCOUNTER — Other Ambulatory Visit: Payer: Self-pay | Admitting: Family Medicine

## 2022-05-31 ENCOUNTER — Other Ambulatory Visit (HOSPITAL_COMMUNITY): Payer: Self-pay

## 2022-06-01 NOTE — Telephone Encounter (Signed)
Pt has scheduled appt 6/8

## 2022-06-02 ENCOUNTER — Encounter: Payer: Self-pay | Admitting: Family Medicine

## 2022-06-02 ENCOUNTER — Other Ambulatory Visit (HOSPITAL_COMMUNITY): Payer: Self-pay

## 2022-06-02 ENCOUNTER — Telehealth (INDEPENDENT_AMBULATORY_CARE_PROVIDER_SITE_OTHER): Payer: 59 | Admitting: Family Medicine

## 2022-06-02 VITALS — BP 122/70 | HR 85

## 2022-06-02 DIAGNOSIS — F339 Major depressive disorder, recurrent, unspecified: Secondary | ICD-10-CM

## 2022-06-02 MED ORDER — FLUOXETINE HCL 20 MG PO TABS
60.0000 mg | ORAL_TABLET | Freq: Every day | ORAL | 6 refills | Status: AC
  Filled 2022-06-02: qty 90, 30d supply, fill #0

## 2022-06-02 MED ORDER — RISPERIDONE 1 MG PO TABS
1.0000 mg | ORAL_TABLET | Freq: Every day | ORAL | 1 refills | Status: DC
  Filled 2022-06-02: qty 30, 30d supply, fill #0

## 2022-06-02 MED ORDER — AMPHETAMINE-DEXTROAMPHET ER 10 MG PO CP24
ORAL_CAPSULE | ORAL | 0 refills | Status: AC
  Filled 2022-06-02: qty 30, 30d supply, fill #0

## 2022-06-02 MED ORDER — AMPHETAMINE-DEXTROAMPHET ER 30 MG PO CP24
ORAL_CAPSULE | Freq: Every day | ORAL | 0 refills | Status: AC
Start: 2022-06-02 — End: ?
  Filled 2022-06-02: qty 30, 30d supply, fill #0

## 2022-06-02 NOTE — Progress Notes (Signed)
Virtual Visit via Video Note  I connected with pt on 06/02/22 at  4:00 PM EDT by a video enabled telemedicine application and verified that I am speaking with the correct person using two identifiers.  Location patient: [place] Location provider:work or home office Persons participating in the virtual visit: patient, provider  I discussed the limitations and requested verbal permission for telemedicine visit. The patient expressed understanding and agreed to proceed.  CC: 61 year old male being seen today for 1 mo f/u MDD. A/P as of last visit: "#1 myotonic dystrophy.  Ongoing progressive decline in strength and functioning, esophageal dysmotility, chronic generalized weakness and fatigue as well as excessive daytime sleepiness.   Marked mobility impairment, essentially wheelchair-bound. He refuses any further PT/OT. He is well cared for by his wife. Palliative care is on board but needs to follow-up (he last saw them in the summer 2022).   He essentially refuses to wear CPAP due to discomfort/anxiety. Glycopyrrolate for excessive oral secretions seems to help some.  Omeprazole 40 mg a day as well. Adderall does help some with increasing concentration/focus, and energy.  2.  Major depressive disorder, recurrent, treatment resistant.   We have tried and failed Celexa, Zoloft, Lamictal, and now Wellbutrin. He has shown mild improvement since getting on the fluoxetine at the 60 mg daily dose, but his improvement has certainly plateaued. Needs psychiatrist help in the situation so I will make referral now. Wean off Wellbutrin: Take 150 mg XL tab every other day x7 doses, then stop. Start risperidone 0.5 mg, half tab nightly x5 days and then increase to 1 whole tab nightly.  Decrease trazodone to 50 mg nightly dose.   3. Hypothyroidism: supposed to be taking 198mg 6 d/week but compliance has historically not been so good.  Last TSH 8 mo ago 1.1. Plan recheck TSH 3-4 mo."  INTERIM HX: Slightly  improved from a mood standpoint. No side effects from risperdal. Sleep dysfunction unchanged. Taking '50mg'$  trazodone qhs as instructed. Has initial appt with pychiatrist, Dr. ADe Nurse set for 06/20/22.  PMP AWARE reviewed today: most recent rx for Adderall XR was filled 05/03/2022, #30, rx by me. No red flags.  ROS: See pertinent positives and negatives per HPI.  Past Medical History:  Diagnosis Date   Adult ADHD    [place]) [place]   [place] 04[place], 08/2017   Recurrent   Chronic respiratory failure ([place]    As of 04/2018, Dr. RChase Callersuspects hypercapnic RF--but [name] normal.  May eventually need nocturnal noninvasive ventilation.  Ultimately, if progresses enough pt will need trach.   Constipation, chronic    Has caused irritative urinary/bladder symptoms in the past.   Depression    Dizziness 06/2011+   [facility] CT [facility]   Elevated transaminase level    Liver biopsy revealed fatty liver   Erectile dysfunction    Esophageal dysmotility    Barium swallow w/out stricture 04/2021. Pt is aspirating   [place]acial [place] 06[place]   Nondisplaced bilateral [place] I fracture: no surgery required. (ENT, Dr. SWilburn Cornelia    Fatty liver disease, nonalcoholic    GERD (gastroesophageal reflux disease)    History of thyroid cancer 2010   Medullary carcinoma of thyroid. [facility]  Needs periodic serum calcitonin monitoring.   Hyperlipidemia    Hypothyroidism    LVH (left ventricular hypertrophy)    by EKG criteria 06/2011.  Hx of heart murmur--Followed  by cardiology at [facility]: EF's have been "stable" per cardio office note 12/29/10.  His echo 12/2012 showed normal LV size and wall thickness but EF 45-50% and global LV hypokinesis was found.   Myotonic dystrophy ([place]) Dx'd 1996   Progressive muscle weakness and swallowing dysfunction.  Initial dx by Dr. Jannifer Franklin,  neurologist, but subsequent annual specialist f/u has been with Dr. Ladon Applebaum [facility].  Progressive debilitation--PT referral 04/2017, pt quit PT 05/2017.   Nephrolithiasis    Obesity, Class I, BMI 30-34.9    OSA (obstructive sleep apnea)    SEVERE 08/2019 sleep study). CPAP to be initiated as of 04/18/20 pulm (Dr. Annamaria Boots). Bipap initiated in hosp 04/2021, home on this as well.   Pulmonary nodule, right 05/2017   RUL, 7 mm sub solid nodule.  Resolution noted on f/u CT 08/2017.    Past Surgical History:  Procedure Laterality Date   COLONOSCOPY  09/13/2007   normal (Dr. Deatra Ina).   EXTRACORPOREAL SHOCK WAVE LITHOTRIPSY     Home sleep study  08/31/2019   Severe OSA   THYROIDECTOMY  08/2009   for medullary thyroid cancer (Dr. Conley Canal at [facility] follows him for this).   TRANSTHORACIC ECHOCARDIOGRAM  12/2012; 09/2017   2014-Normal LV size and wall thickness, LV EF 45-50%, with global hypokinesis of LV.  2018- EF 50-55%, normal wall motion, grd I DD, valves normal.     Current Outpatient Medications:    acetaminophen (TYLENOL) 500 MG tablet, Take 1,000 mg by mouth at bedtime., Disp: , Rfl:    amphetamine-dextroamphetamine (ADDERALL XR) 30 MG 24 hr capsule, TAKE 1 CAPSULE BY MOUTH ONCE DAILY, Disp: 30 capsule, Rfl: 0   buPROPion (WELLBUTRIN XL) 150 MG 24 hr tablet, Take 1 tablet by mouth every other day for 7 doses, Disp: 7 tablet, Rfl: 0   clotrimazole-betamethasone (LOTRISONE) cream, APPLY TO THE AFFECTED AREA(S) TWICE DAILY AS NEEDED (Patient taking differently: Apply 1 application  topically 2 (two) times daily as needed (to affected areas).), Disp: 45 g, Rfl: 1   FLUoxetine (PROZAC) 20 MG tablet, Take 3 tablets (60 mg total) by mouth daily., Disp: 90 tablet, Rfl: 6   glycopyrrolate (ROBINUL) 2 MG tablet, TAKE 1/2 TO 1 TABLET BY MOUTH TWO TIMES DAILY AS NEEDED FOR EXCESSIVE ORAL SECRETIONS, Disp: 60 tablet, Rfl: 6   levothyroxine (SYNTHROID) 150 MCG tablet, Take 1 tablet (150 mcg total) by mouth  daily before breakfast., Disp: 84 tablet, Rfl: 1   Melatonin 10 MG CAPS, Take 10 mg by mouth at bedtime., Disp: , Rfl:    omeprazole (PRILOSEC) 40 MG capsule, TAKE 1 CAPSULE BY MOUTH ONCE DAILY, Disp: 90 capsule, Rfl: 2   risperiDONE (RISPERDAL) 0.5 MG tablet, Take 1 tablet (0.5 mg total) by mouth at bedtime., Disp: 30 tablet, Rfl: 0   traZODone (DESYREL) 100 MG tablet, 1/2 tab by mouth qhs, Disp: 45 tablet, Rfl: 3   amphetamine-dextroamphetamine (ADDERALL XR) 10 MG 24 hr capsule, TAKE 1 CAPSULE BY MOUTH ONCE DAILY (TO BE TAKEN WITH 30 MG XR CAPS), Disp: 30 capsule, Rfl: 0  EXAM:  VITALS per patient if applicable:     06/01/8937    4:08 PM 04/28/2022    4:09 PM 08/31/2021    9:14 AM  Vitals with BMI  Weight   101 lbs  Systolic [phone]  Diastolic 70 62 76  Pulse 85 83 86    GENERAL: alert, oriented, appears well and in no acute distress  HEENT: atraumatic, conjunttiva clear, no obvious abnormalities  on inspection of external nose and ears  NECK: normal movements of the head and neck  LUNGS: on inspection no signs of respiratory distress, breathing rate appears normal, no obvious gross SOB, gasping or wheezing  CV: no obvious cyanosis  MS: moves all visible extremities without noticeable abnormality  PSYCH/NEURO: pleasant and cooperative, no obvious depression or anxiety, speech and thought processing grossly intact  LABS: none today    Chemistry      Component Value Date/Time   NA 140 08/31/2021 0943   K 3.8 08/31/2021 0943   CL 100 08/31/2021 0943   CO2 29 08/31/2021 0943   BUN 11 08/31/2021 0943   CREATININE 0.84 08/31/2021 0943   CREATININE 0.95 02/20/2013 1604      Component Value Date/Time   CALCIUM 9.8 08/31/2021 0943   ALKPHOS 41 05/19/2021 0213   AST 19 05/19/2021 0213   ALT 21 05/19/2021 0213   BILITOT 0.7 05/19/2021 0213     Lab Results  Component Value Date   WBC 3.7 (L) 05/19/2021   HGB 12.7 (L) 05/19/2021   HCT 40.1 05/19/2021   MCV 94.6  05/19/2021   PLT 174 05/19/2021   Lab Results  Component Value Date   TSH 1.10 08/31/2021   ASSESSMENT AND PLAN:  Discussed the following assessment and plan:  1) Major depressive disorder, recurrent, treatment resistant.   We have tried and failed Celexa, Zoloft, Lamictal, and now Wellbutrin. Slight improvement noted since recent start of risperdal. Continue w/out any changes today. First psychiatrist visit set for 6/26.    I discussed the assessment and treatment plan with the patient. The patient was provided an opportunity to ask questions and all were answered. The patient agreed with the plan and demonstrated an understanding of the instructions.   F/u: 1 mo  Signed:  Crissie Sickles, MD           06/02/2022

## 2022-06-03 ENCOUNTER — Other Ambulatory Visit (HOSPITAL_COMMUNITY): Payer: Self-pay

## 2022-06-08 ENCOUNTER — Telehealth: Payer: Self-pay

## 2022-06-08 NOTE — Telephone Encounter (Signed)
Pt's wife called stating that in the video visit they had with PCP, she thought that PCP did not want to make changes to the dose of risperidone. Office note states Start risperidone 0.5 mg, half tab nightly x5 days and then increase to 1 whole tab nightly.  Decrease trazodone to 50 mg nightly dose. She states that pt was taking half of the 0.5 mg and was not sure what pt is supposed to be on now.  When they received the mail ordered medication they received riseridone '1mg'$ . Pt's wife would like to confirm what dose of medication pt is supposed to be on.  Please advise

## 2022-06-08 NOTE — Telephone Encounter (Signed)
My fault. Continue risperidone 0.'5mg'$  qhs

## 2022-06-08 NOTE — Telephone Encounter (Signed)
Pt's wife advised of med recommendations.

## 2022-06-20 ENCOUNTER — Encounter (HOSPITAL_COMMUNITY): Payer: Self-pay | Admitting: Psychiatry

## 2022-06-20 ENCOUNTER — Telehealth (INDEPENDENT_AMBULATORY_CARE_PROVIDER_SITE_OTHER): Payer: 59 | Admitting: Psychiatry

## 2022-06-20 DIAGNOSIS — F4323 Adjustment disorder with mixed anxiety and depressed mood: Secondary | ICD-10-CM | POA: Diagnosis not present

## 2022-06-20 DIAGNOSIS — F331 Major depressive disorder, recurrent, moderate: Secondary | ICD-10-CM

## 2022-06-20 DIAGNOSIS — F411 Generalized anxiety disorder: Secondary | ICD-10-CM | POA: Diagnosis not present

## 2022-06-20 MED ORDER — RISPERIDONE 0.5 MG PO TABS: 0.5000 mg | ORAL_TABLET | Freq: Every day | ORAL | 0 refills | Status: AC

## 2022-06-20 MED ORDER — TRAZODONE HCL 50 MG PO TABS: ORAL_TABLET | ORAL | 0 refills | Status: AC

## 2022-06-20 NOTE — Progress Notes (Signed)
Psychiatric Initial Adult Assessment   Patient Identification: PEYTEN ARNETTE MRN:  [id number] Date of Evaluation:  06/20/2022 Referral Source: primary care Chief Complaint:   Chief Complaint  Patient presents with   Anxiety   Establish Care   Visit Diagnosis:    ICD-10-CM   1. MDD (major depressive disorder), recurrent episode, moderate (HCC)  F33.1     2. GAD (generalized anxiety disorder)  F41.1     3. Adjustment disorder with mixed anxiety and depressed mood  F43.23     Virtual Visit via Video Note  I connected with Rogue Bussing on 06/20/22 at  2:00 PM EDT by a video enabled telemedicine application and verified that I am speaking with the correct person using two identifiers.  Location: Patient: home with wife Provider: home office   I discussed the limitations of evaluation and management by telemedicine and the availability of in person appointments. The patient expressed understanding and agreed to proceed.      I discussed the assessment and treatment plan with the patient. The patient was provided an opportunity to ask questions and all were answered. The patient agreed with the plan and demonstrated an understanding of the instructions.   The patient was advised to call back or seek an in-person evaluation if the symptoms worsen or if the condition fails to improve as anticipated.  I provided 60 minutes of non-face-to-face time during this encounter including chart review, documentation   Thresa Ross, MD   History of Present Illness:  61 years old married white male, referred by pcp to establish care for depression,   Patient suffers from myotonic dystrophy that has affected his muscle weakness and loss of muscle mass interview was done with his wife.  Patient lying in the bed appears to be somewhat sedated and tired speak communicable but at times difficult to understand.  Patient endorses has been having depression symptoms but with added Risperdal there  is some improvement some positivity.  Wife has mentioned that patient is not talking about helplessness he does have some hope and he talks with his brothers also who has the same condition.  There is less paranoia and he is focused on less distraction and by adding the Risperdal before that wife has mentioned at times it appeared that he was looking as if there is somebody around or some paranoia.    Patient has limitations because of his movements and muscle mass dystrophy may consider palliative care as well he wanted to go visit his son in [place] that may take planning with him and the wife considering that he is wheelchair-bound and progressive muscle weakness.  In regarding anxiety he does worry his worries are reasonable he has a good support of his wife married for more than 40 years.  Does not endorse hopelessness his wife has mentioned with adding the respite all he has been not talking about negativity or hopelessness rather he has talked about some hope.  Other medication tried has been Lamictal, Wellbutrin and SSRI.   Prozac at a dose of 60 mg is working he is also on Adderall wife has mentioned that he has been diagnosed with ADHD for a long time and ADHD medication Adderall keeps him somewhat focused but he still sleeps during the day then he has difficulty sleeping at night for which she is on trazodone I cautioned not to take too many sedating medication and adjust his sleep-wake cycle considering he is sleeping during the day or catching up with  naps  As of now does not endorse paranoia or hallucinations does not endorse or have any clear history of mania there is no prior history of depression till he was diagnosed in that may have led to his limitation and feeling of despair      Aggravating factor: myotonic dystrophy, progressive muscle weakness and limitations  Modifying factor: wife,   Duration more then 5 years  Severity : depressed   Past Psychiatric History:  depression, anxiety, tiredness  Previous Psychotropic Medications: No   Substance Abuse History in the last 12 months:  No.  Consequences of Substance Abuse: NA  Past Medical History:  Past Medical History:  Diagnosis Date   Adult ADHD    AKI (acute kidney injury) (HCC) 10/31/2018   Aspiration pneumonitis (HCC) 04/2017, 08/2017   Recurrent   Chronic respiratory failure (HCC)    As of 04/2018, Dr. Marchelle Gearing suspects hypercapnic RF--but [name] normal.  May eventually need nocturnal noninvasive ventilation.  Ultimately, if progresses enough pt will need trach.   Constipation, chronic    Has caused irritative urinary/bladder symptoms in the past.   Depression    Dizziness 06/2011+   Noncontrast CT and noncontrast MRI both negative 06/2011 at [facility] ED visit.   Elevated transaminase level    Liver biopsy revealed fatty liver   Erectile dysfunction    Esophageal dysmotility    Barium swallow w/out stricture 04/2021. Pt is aspirating   Facial fracture (HCC) 06/2011   Nondisplaced bilateral [place] I fracture: no surgery required. (ENT, Dr. Annalee Genta)    Fatty liver disease, nonalcoholic    GERD (gastroesophageal reflux disease)    History of thyroid cancer 2010   Medullary carcinoma of thyroid. Thyroidectomy 08/2009 (Dr. Willadean Carol at [facility]).  Needs periodic serum calcitonin monitoring.   Hyperlipidemia    Hypothyroidism    LVH (left ventricular hypertrophy)    by EKG criteria 06/2011.  Hx of heart murmur--Followed by cardiology at [facility]: EF's have been "stable" per cardio office note 12/29/10.  His echo 12/2012 showed normal LV size and wall thickness but EF 45-50% and global LV hypokinesis was found.   Myotonic dystrophy (HCC) Dx'd 1996   Progressive muscle weakness and swallowing dysfunction.  Initial dx by Dr. Anne Hahn, neurologist, but subsequent annual specialist f/u has been with Dr. Janene Harvey [facility].  Progressive debilitation--PT referral 04/2017, pt quit PT 05/2017.   Nephrolithiasis     Obesity, Class I, BMI 30-34.9    OSA (obstructive sleep apnea)    SEVERE 08/2019 sleep study). CPAP to be initiated as of 04/18/20 pulm (Dr. Maple Hudson). Bipap initiated in hosp 04/2021, home on this as well.   Pulmonary nodule, right 05/2017   RUL, 7 mm sub solid nodule.  Resolution noted on f/u CT 08/2017.    Past Surgical History:  Procedure Laterality Date   COLONOSCOPY  09/13/2007   normal (Dr. Arlyce Dice).   EXTRACORPOREAL SHOCK WAVE LITHOTRIPSY     Home sleep study  08/31/2019   Severe OSA   THYROIDECTOMY  08/2009   for medullary thyroid cancer (Dr. Lendell Caprice at [facility] follows him for this).   TRANSTHORACIC ECHOCARDIOGRAM  12/2012; 09/2017   2014-Normal LV size and wall thickness, LV EF 45-50%, with global hypokinesis of LV.  2018- EF 50-55%, normal wall motion, grd I DD, valves normal.    Family Psychiatric History: alcohol abuse: father  Family History:  Family History  Problem Relation Age of Onset   Arthritis Mother    Hypertension Mother    Alcohol  abuse Father    Arthritis Father    Hypertension Father    Colon cancer Father    Prostate cancer Father    Heart disease Brother     Social History:   Social History   Socioeconomic History   Marital status: Married    Spouse name: Amante Kosek   Number of children: 1   Years of education: College   Highest education level: Bachelor's degree (e.g., BA, AB, BS)  Occupational History   Occupation: Disabled  Tobacco Use   Smoking status: Never   Smokeless tobacco: Never  Vaping Use   Vaping Use: Never used  Substance and Sexual Activity   Alcohol use: Yes    Comment: rare beer   Drug use: No   Sexual activity: Not Currently  Other Topics Concern   Not on file  Social History Narrative   Married, one son Casimiro Needle).   Occupation: Public relations account executive, currently unemployed, working on appeal for medical disability as of 11/2011.  No tob or drugs.  Drinks alcohol socially.   Social Determinants of Health   Financial  Resource Strain: Low Risk  (11/28/2021)   Overall Financial Resource Strain (CARDIA)    Difficulty of Paying Living Expenses: Not very hard  Food Insecurity: No Food Insecurity (11/28/2021)   Hunger Vital Sign    Worried About Running Out of Food in the Last Year: Never true    Ran Out of Food in the Last Year: Never true  Transportation Needs: No Transportation Needs (11/28/2021)   PRAPARE - Administrator, Civil Service (Medical): No    Lack of Transportation (Non-Medical): No  Physical Activity: Unknown (11/28/2021)   Exercise Vital Sign    Days of Exercise per Week: 0 days    Minutes of Exercise per Session: Not on file  Stress: Stress Concern Present (11/28/2021)   [employer] of Occupational Health - Occupational Stress Questionnaire    Feeling of Stress : To some extent  Social Connections: Moderately Isolated (11/28/2021)   Social Connection and Isolation Panel [NHANES]    Frequency of Communication with Friends and Family: Twice a week    Frequency of Social Gatherings with Friends and Family: Once a week    Attends Religious Services: Never    Database administrator or Organizations: No    Attends Engineer, structural: Not on file    Marital Status: Married    Additional Social History: married, lives with wife, disability due to motonic dystrophy. No concern growing up  Allergies:  No Known Allergies  Metabolic Disorder Labs: No results found for: "HGBA1C", "MPG" No results found for: "PROLACTIN" Lab Results  Component Value Date   CHOL 262 (H) 01/26/2021   TRIG 142.0 01/26/2021   HDL 85.20 01/26/2021   CHOLHDL 3 01/26/2021   VLDL 28.4 01/26/2021   LDLCALC 148 (H) 01/26/2021   LDLCALC 138 (H) 07/17/2018   Lab Results  Component Value Date   TSH 1.10 08/31/2021    Therapeutic Level Labs: No results found for: "LITHIUM" No results found for: "CBMZ" No results found for: "VALPROATE"  Current Medications: Current Outpatient  Medications  Medication Sig Dispense Refill   acetaminophen (TYLENOL) 500 MG tablet Take 1,000 mg by mouth at bedtime.     amphetamine-dextroamphetamine (ADDERALL XR) 10 MG 24 hr capsule TAKE 1 CAPSULE BY MOUTH ONCE DAILY (TO BE TAKEN WITH 30 MG XR CAPS) 30 capsule 0   amphetamine-dextroamphetamine (ADDERALL XR) 30 MG 24 hr capsule TAKE 1  CAPSULE BY MOUTH ONCE DAILY 30 capsule 0   buPROPion (WELLBUTRIN XL) 150 MG 24 hr tablet Take 1 tablet by mouth every other day for 7 doses 7 tablet 0   clotrimazole-betamethasone (LOTRISONE) cream APPLY TO THE AFFECTED AREA(S) TWICE DAILY AS NEEDED (Patient taking differently: Apply 1 application  topically 2 (two) times daily as needed (to affected areas).) 45 g 1   FLUoxetine (PROZAC) 20 MG tablet Take 3 tablets (60 mg total) by mouth daily. 90 tablet 6   glycopyrrolate (ROBINUL) 2 MG tablet TAKE 1/2 TO 1 TABLET BY MOUTH TWO TIMES DAILY AS NEEDED FOR EXCESSIVE ORAL SECRETIONS 60 tablet 6   levothyroxine (SYNTHROID) 150 MCG tablet Take 1 tablet (150 mcg total) by mouth daily before breakfast. 84 tablet 1   Melatonin 10 MG CAPS Take 10 mg by mouth at bedtime.     omeprazole (PRILOSEC) 40 MG capsule TAKE 1 CAPSULE BY MOUTH ONCE DAILY 90 capsule 2   risperiDONE (RISPERDAL) 1 MG tablet Take 1 tablet (1 mg total) by mouth at bedtime. 30 tablet 1   traZODone (DESYREL) 100 MG tablet 1/2 tab by mouth qhs 45 tablet 3   No current facility-administered medications for this visit.     Psychiatric Specialty Exam: Review of Systems  Cardiovascular:  Negative for chest pain.  Psychiatric/Behavioral:  Positive for decreased concentration. Negative for self-injury.     There were no vitals taken for this visit.There is no height or weight on file to calculate BMI.  General Appearance: Casual  Eye Contact:  Fair  Speech:  Slow  Volume:  Decreased  Mood:   subdued  Affect:  Congruent  Thought Process:  Goal Directed  Orientation:  Full (Time, Place, and Person)   Thought Content:  Rumination  Suicidal Thoughts:  No  Homicidal Thoughts:  No  Memory:  Immediate;   Fair  Judgement:  Fair  Insight:  Shallow  Psychomotor Activity:  Decreased  Concentration:  Concentration: Fair  Recall:  [employer] of Knowledge:Fair  Language: Fair  Akathisia:  No  Handed:    AIMS (if indicated):  no involuntary movements noticeable  Assets:  Desire for Improvement Social Support  ADL's:  Impaired. Needs assistant to walk and do activities  Cognition: Impaired,  Mild  Sleep:   irregular   Screenings: PHQ2-9    Flowsheet Row Video Visit from 06/20/2022 in [facility] AT [place] Video Visit from 06/02/2022 in [facility] Video Visit from 11/29/2021 in [facility] Visit from 03/29/2021 in [facility] Patient Outreach Telephone from 12/07/2020 in [facility] Care Coordination  PHQ-2 Total Score 2 1 4 5 4   PHQ-9 Total Score 13 15 19 21 7       Flowsheet Row Video Visit from 06/20/2022 in [facility] AT [place] ED to Hosp-Admission (Discharged) from 05/12/2021 in [facility] ED from 04/01/2021 in [facility] at [facility] RISK CATEGORY No Risk No Risk No Risk       Assessment and Plan: as follows  Major depressive disorder recurrent moderate to severe; relevant to his underlying medical condition and limitations Prozac has been helping we will continue 60 mg he is not on Wellbutrin anymore continue Resporal a small dose of 0.5 mg for mood stability and sleep at night  Anxiety disorder adjustment disorder; continue Prozac continue providing care wife has been very  supportive they may look into a plan of visiting [place]  Palliative care is an option wife and family will discuss considering wife being the long caregiver and his progressive condition  As of now do not want to add  more Prozac or any other medication provided supportive therapy consider therapy if needed wife mentioned that they will think about it but considering that he talks less she feels somewhat tired during the daytime it may be a more of a struggle to do therapy In general some improvement with his depression and regarding to Prozac combined with risperdal there is no added more side effects by adding the Risperdal  Follow-up with providers and primary care physician  Follow-up in 4 to 5 weeks medication refills can be requested if needed medication discussed therapeutic plan discussed   Palliative care consultation done   Collaboration of Care: Primary Care Provider AEB notes and meds reviewed   Patient/Guardian was advised Release of Information must be obtained prior to any record release in order to collaborate their care with an outside provider. Patient/Guardian was advised if they have not already done so to contact the registration department to sign all necessary forms in order for [place] to release information regarding their care.   Consent: Patient/Guardian gives verbal consent for treatment and assignment of benefits for services provided during this visit. Patient/Guardian expressed understanding and agreed to proceed.   Thresa Ross, MD 6/26/20232:09 PM

## 2022-06-21 ENCOUNTER — Other Ambulatory Visit: Payer: Self-pay

## 2022-06-21 ENCOUNTER — Inpatient Hospital Stay (HOSPITAL_COMMUNITY)
Admission: EM | Admit: 2022-06-21 | Discharge: 2022-06-25 | DRG: 177 | Disposition: E | Payer: 59 | Attending: Student | Admitting: Student

## 2022-06-21 ENCOUNTER — Encounter (HOSPITAL_COMMUNITY): Payer: Self-pay | Admitting: Oncology

## 2022-06-21 ENCOUNTER — Emergency Department (HOSPITAL_COMMUNITY): Payer: 59

## 2022-06-21 DIAGNOSIS — Z7189 Other specified counseling: Secondary | ICD-10-CM

## 2022-06-21 DIAGNOSIS — F32A Depression, unspecified: Secondary | ICD-10-CM | POA: Diagnosis present

## 2022-06-21 DIAGNOSIS — I10 Essential (primary) hypertension: Secondary | ICD-10-CM | POA: Diagnosis present

## 2022-06-21 DIAGNOSIS — F909 Attention-deficit hyperactivity disorder, unspecified type: Secondary | ICD-10-CM | POA: Diagnosis present

## 2022-06-21 DIAGNOSIS — K5909 Other constipation: Secondary | ICD-10-CM | POA: Diagnosis present

## 2022-06-21 DIAGNOSIS — G9340 Encephalopathy, unspecified: Secondary | ICD-10-CM | POA: Diagnosis present

## 2022-06-21 DIAGNOSIS — Z20822 Contact with and (suspected) exposure to covid-19: Secondary | ICD-10-CM | POA: Diagnosis not present

## 2022-06-21 DIAGNOSIS — G7111 Myotonic muscular dystrophy: Secondary | ICD-10-CM | POA: Diagnosis not present

## 2022-06-21 DIAGNOSIS — R509 Fever, unspecified: Secondary | ICD-10-CM | POA: Diagnosis not present

## 2022-06-21 DIAGNOSIS — R627 Adult failure to thrive: Secondary | ICD-10-CM | POA: Diagnosis present

## 2022-06-21 DIAGNOSIS — R471 Dysarthria and anarthria: Secondary | ICD-10-CM | POA: Diagnosis present

## 2022-06-21 DIAGNOSIS — G9341 Metabolic encephalopathy: Secondary | ICD-10-CM | POA: Diagnosis not present

## 2022-06-21 DIAGNOSIS — J9601 Acute respiratory failure with hypoxia: Secondary | ICD-10-CM | POA: Diagnosis present

## 2022-06-21 DIAGNOSIS — Z7401 Bed confinement status: Secondary | ICD-10-CM

## 2022-06-21 DIAGNOSIS — J189 Pneumonia, unspecified organism: Principal | ICD-10-CM | POA: Diagnosis present

## 2022-06-21 DIAGNOSIS — K224 Dyskinesia of esophagus: Secondary | ICD-10-CM | POA: Diagnosis present

## 2022-06-21 DIAGNOSIS — E876 Hypokalemia: Secondary | ICD-10-CM | POA: Diagnosis present

## 2022-06-21 DIAGNOSIS — Z85850 Personal history of malignant neoplasm of thyroid: Secondary | ICD-10-CM

## 2022-06-21 DIAGNOSIS — Z79891 Long term (current) use of opiate analgesic: Secondary | ICD-10-CM

## 2022-06-21 DIAGNOSIS — G4733 Obstructive sleep apnea (adult) (pediatric): Secondary | ICD-10-CM | POA: Diagnosis present

## 2022-06-21 DIAGNOSIS — R0902 Hypoxemia: Secondary | ICD-10-CM | POA: Diagnosis not present

## 2022-06-21 DIAGNOSIS — J9621 Acute and chronic respiratory failure with hypoxia: Secondary | ICD-10-CM | POA: Diagnosis not present

## 2022-06-21 DIAGNOSIS — E039 Hypothyroidism, unspecified: Secondary | ICD-10-CM | POA: Diagnosis present

## 2022-06-21 DIAGNOSIS — R4182 Altered mental status, unspecified: Secondary | ICD-10-CM | POA: Diagnosis not present

## 2022-06-21 DIAGNOSIS — K760 Fatty (change of) liver, not elsewhere classified: Secondary | ICD-10-CM | POA: Diagnosis not present

## 2022-06-21 DIAGNOSIS — R54 Age-related physical debility: Secondary | ICD-10-CM | POA: Diagnosis present

## 2022-06-21 DIAGNOSIS — Z6831 Body mass index (BMI) 31.0-31.9, adult: Secondary | ICD-10-CM

## 2022-06-21 DIAGNOSIS — Z8701 Personal history of pneumonia (recurrent): Secondary | ICD-10-CM

## 2022-06-21 DIAGNOSIS — Z66 Do not resuscitate: Secondary | ICD-10-CM | POA: Diagnosis not present

## 2022-06-21 DIAGNOSIS — E785 Hyperlipidemia, unspecified: Secondary | ICD-10-CM | POA: Diagnosis present

## 2022-06-21 DIAGNOSIS — Z515 Encounter for palliative care: Secondary | ICD-10-CM

## 2022-06-21 DIAGNOSIS — D649 Anemia, unspecified: Secondary | ICD-10-CM | POA: Diagnosis present

## 2022-06-21 DIAGNOSIS — E890 Postprocedural hypothyroidism: Secondary | ICD-10-CM | POA: Diagnosis not present

## 2022-06-21 DIAGNOSIS — E669 Obesity, unspecified: Secondary | ICD-10-CM | POA: Diagnosis present

## 2022-06-21 DIAGNOSIS — Z79899 Other long term (current) drug therapy: Secondary | ICD-10-CM

## 2022-06-21 DIAGNOSIS — R5381 Other malaise: Secondary | ICD-10-CM | POA: Diagnosis present

## 2022-06-21 DIAGNOSIS — Z79890 Hormone replacement therapy (postmenopausal): Secondary | ICD-10-CM

## 2022-06-21 DIAGNOSIS — R059 Cough, unspecified: Secondary | ICD-10-CM | POA: Diagnosis not present

## 2022-06-21 DIAGNOSIS — K219 Gastro-esophageal reflux disease without esophagitis: Secondary | ICD-10-CM | POA: Diagnosis present

## 2022-06-21 DIAGNOSIS — R410 Disorientation, unspecified: Secondary | ICD-10-CM | POA: Diagnosis not present

## 2022-06-21 DIAGNOSIS — J690 Pneumonitis due to inhalation of food and vomit: Secondary | ICD-10-CM | POA: Diagnosis not present

## 2022-06-21 DIAGNOSIS — K5900 Constipation, unspecified: Secondary | ICD-10-CM | POA: Diagnosis not present

## 2022-06-21 DIAGNOSIS — R079 Chest pain, unspecified: Secondary | ICD-10-CM | POA: Diagnosis not present

## 2022-06-21 LAB — COMPREHENSIVE METABOLIC PANEL
ALT: 12 U/L (ref 0–44)
AST: 15 U/L (ref 15–41)
Albumin: 2.7 g/dL — ABNORMAL LOW (ref 3.5–5.0)
Alkaline Phosphatase: 54 U/L (ref 38–126)
Anion gap: 7 (ref 5–15)
BUN: 19 mg/dL (ref 8–23)
CO2: 30 mmol/L (ref 22–32)
Calcium: 9.1 mg/dL (ref 8.9–10.3)
Chloride: 103 mmol/L (ref 98–111)
Creatinine, Ser: 0.61 mg/dL (ref 0.61–1.24)
GFR, Estimated: >60 mL/min (ref >60)
Glucose, Bld: 95 mg/dL (ref 70–99)
Potassium: 3.4 mmol/L — ABNORMAL LOW (ref 3.5–5.1)
Sodium: 140 mmol/L (ref 135–145)
Total Bilirubin: 1.1 mg/dL (ref 0.3–1.2)
Total Protein: 6 g/dL — ABNORMAL LOW (ref 6.5–8.1)

## 2022-06-21 LAB — URINALYSIS, ROUTINE W REFLEX MICROSCOPIC
Bacteria, UA: NONE SEEN
Bilirubin Urine: NEGATIVE
Glucose, UA: NEGATIVE mg/dL
Ketones, ur: 5 mg/dL — AB
Leukocytes,Ua: NEGATIVE
Nitrite: NEGATIVE
Protein, ur: NEGATIVE mg/dL
RBC / HPF: >50 RBC/hpf — ABNORMAL HIGH (ref 0–5)
Specific Gravity, Urine: 1.021 (ref 1.005–1.030)
pH: 6 (ref 5.0–8.0)

## 2022-06-21 LAB — CBC WITH DIFFERENTIAL/PLATELET
Abs Immature Granulocytes: 0.07 10*3/uL (ref 0.00–0.07)
Basophils Absolute: 0 10*3/uL (ref 0.0–0.1)
Basophils Relative: 0 %
Eosinophils Absolute: 0 10*3/uL (ref 0.0–0.5)
Eosinophils Relative: 0 %
HCT: 37.7 % — ABNORMAL LOW (ref 39.0–52.0)
Hemoglobin: 11.9 g/dL — ABNORMAL LOW (ref 13.0–17.0)
Immature Granulocytes: 1 %
Lymphocytes Relative: 6 %
Lymphs Abs: 0.5 10*3/uL — ABNORMAL LOW (ref 0.7–4.0)
MCH: 31.5 pg (ref 26.0–34.0)
MCHC: 31.6 g/dL (ref 30.0–36.0)
MCV: 99.7 fL (ref 80.0–100.0)
Monocytes Absolute: 0.4 10*3/uL (ref 0.1–1.0)
Monocytes Relative: 5 %
Neutro Abs: 6.5 10*3/uL (ref 1.7–7.7)
Neutrophils Relative %: 88 %
Platelets: 155 10*3/uL (ref 150–400)
RBC: 3.78 MIL/uL — ABNORMAL LOW (ref 4.22–5.81)
RDW: 15.9 % — ABNORMAL HIGH (ref 11.5–15.5)
WBC: 7.5 10*3/uL (ref 4.0–10.5)
nRBC: 0 % (ref 0.0–0.2)

## 2022-06-21 LAB — RESP PANEL BY RT-PCR (FLU A&B, COVID) ARPGX2
Influenza A by PCR: NEGATIVE
Influenza B by PCR: NEGATIVE
SARS Coronavirus 2 by RT PCR: NEGATIVE

## 2022-06-21 LAB — PROTIME-INR
INR: 1.1 (ref 0.8–1.2)
Prothrombin Time: 13.9 seconds (ref 11.4–15.2)

## 2022-06-21 LAB — LACTIC ACID, PLASMA: Lactic Acid, Venous: 0.9 mmol/L (ref 0.5–1.9)

## 2022-06-21 MED ORDER — AMPHETAMINE-DEXTROAMPHET ER 10 MG PO CP24
30.0000 mg | ORAL_CAPSULE | Freq: Every day | ORAL | Status: DC
  Filled 2022-06-21: qty 3

## 2022-06-21 MED ORDER — SODIUM CHLORIDE 0.9 % IV SOLN
500.0000 mg | INTRAVENOUS | Status: DC
  Administered 2022-06-21: 500 mg via INTRAVENOUS
  Filled 2022-06-21 (×2): qty 5

## 2022-06-21 MED ORDER — LEVOTHYROXINE SODIUM 50 MCG PO TABS
150.0000 ug | ORAL_TABLET | Freq: Every day | ORAL | Status: DC
  Filled 2022-06-21: qty 1

## 2022-06-21 MED ORDER — GLYCOPYRROLATE 1 MG PO TABS: 1.0000 mg | ORAL_TABLET | Freq: Three times a day (TID) | ORAL | Status: DC | PRN

## 2022-06-21 MED ORDER — ACETAMINOPHEN 650 MG RE SUPP: 650.0000 mg | Freq: Four times a day (QID) | RECTAL | Status: DC | PRN

## 2022-06-21 MED ORDER — ENOXAPARIN SODIUM 40 MG/0.4ML IJ SOSY
40.0000 mg | PREFILLED_SYRINGE | INTRAMUSCULAR | Status: DC
Start: 2022-06-21 — End: 2022-06-22
  Administered 2022-06-21: 40 mg via SUBCUTANEOUS
  Filled 2022-06-21: qty 0.4

## 2022-06-21 MED ORDER — MELATONIN 5 MG PO TABS
10.0000 mg | ORAL_TABLET | Freq: Every day | ORAL | Status: DC
  Filled 2022-06-21: qty 2

## 2022-06-21 MED ORDER — LACTATED RINGERS IV BOLUS
1000.0000 mL | Freq: Once | INTRAVENOUS | Status: AC
  Administered 2022-06-21: 1000 mL via INTRAVENOUS

## 2022-06-21 MED ORDER — ONDANSETRON HCL 4 MG/2ML IJ SOLN: 4.0000 mg | Freq: Four times a day (QID) | INTRAMUSCULAR | Status: DC | PRN

## 2022-06-21 MED ORDER — AMPICILLIN-SULBACTAM SODIUM 3 (2-1) G IJ SOLR
3.0000 g | Freq: Four times a day (QID) | INTRAMUSCULAR | Status: DC
  Administered 2022-06-21 – 2022-06-22 (×3): 3 g via INTRAVENOUS
  Filled 2022-06-21 (×4): qty 8

## 2022-06-21 MED ORDER — POTASSIUM CHLORIDE IN NACL 20-0.9 MEQ/L-% IV SOLN
INTRAVENOUS | Status: DC
  Filled 2022-06-21 (×3): qty 1000

## 2022-06-21 MED ORDER — MELATONIN 5 MG PO TABS
10.0000 mg | ORAL_TABLET | Freq: Every day | ORAL | Status: DC
  Administered 2022-06-21: 10 mg via ORAL
  Filled 2022-06-21: qty 2

## 2022-06-21 MED ORDER — TRAZODONE HCL 50 MG PO TABS
25.0000 mg | ORAL_TABLET | Freq: Every day | ORAL | Status: DC
  Administered 2022-06-21: 25 mg via ORAL
  Filled 2022-06-21: qty 1

## 2022-06-21 MED ORDER — ACETAMINOPHEN 325 MG PO TABS: 650.0000 mg | ORAL_TABLET | Freq: Four times a day (QID) | ORAL | Status: DC | PRN

## 2022-06-21 MED ORDER — ALBUTEROL SULFATE (2.5 MG/3ML) 0.083% IN NEBU: 2.5000 mg | INHALATION_SOLUTION | RESPIRATORY_TRACT | Status: DC | PRN

## 2022-06-21 MED ORDER — DOCUSATE SODIUM 100 MG PO CAPS
100.0000 mg | ORAL_CAPSULE | Freq: Two times a day (BID) | ORAL | Status: DC
  Administered 2022-06-21: 100 mg via ORAL
  Filled 2022-06-21 (×2): qty 1

## 2022-06-21 MED ORDER — ONDANSETRON HCL 4 MG PO TABS: 4.0000 mg | ORAL_TABLET | Freq: Four times a day (QID) | ORAL | Status: DC | PRN

## 2022-06-21 MED ORDER — FLUOXETINE HCL 20 MG PO TABS
60.0000 mg | ORAL_TABLET | Freq: Every day | ORAL | Status: DC
  Filled 2022-06-21: qty 3

## 2022-06-21 MED ORDER — RISPERIDONE 0.5 MG PO TABS
0.5000 mg | ORAL_TABLET | Freq: Every day | ORAL | Status: DC
  Administered 2022-06-21: 0.5 mg via ORAL
  Filled 2022-06-21 (×3): qty 1

## 2022-06-21 MED ORDER — PANTOPRAZOLE SODIUM 40 MG PO TBEC
40.0000 mg | DELAYED_RELEASE_TABLET | Freq: Every day | ORAL | Status: DC
  Filled 2022-06-21: qty 1

## 2022-06-21 MED ORDER — FLUOXETINE HCL 20 MG PO CAPS
60.0000 mg | ORAL_CAPSULE | Freq: Every day | ORAL | Status: DC
Start: 2022-06-21 — End: 2022-06-22
  Administered 2022-06-21: 60 mg via ORAL
  Filled 2022-06-21: qty 3

## 2022-06-21 MED ORDER — POLYETHYLENE GLYCOL 3350 17 G PO PACK
17.0000 g | PACK | Freq: Every day | ORAL | Status: DC
  Filled 2022-06-21: qty 1

## 2022-06-21 MED ORDER — SODIUM CHLORIDE 0.9 % IV SOLN
2.0000 g | Freq: Once | INTRAVENOUS | Status: AC
  Administered 2022-06-21: 2 g via INTRAVENOUS
  Filled 2022-06-21: qty 20

## 2022-06-21 MED ORDER — POTASSIUM CHLORIDE 10 MEQ/100ML IV SOLN
10.0000 meq | INTRAVENOUS | Status: AC
  Administered 2022-06-21 (×2): 10 meq via INTRAVENOUS
  Filled 2022-06-21 (×2): qty 100

## 2022-06-22 ENCOUNTER — Inpatient Hospital Stay (HOSPITAL_COMMUNITY): Payer: 59

## 2022-06-22 DIAGNOSIS — J9601 Acute respiratory failure with hypoxia: Secondary | ICD-10-CM | POA: Diagnosis present

## 2022-06-22 DIAGNOSIS — R5381 Other malaise: Secondary | ICD-10-CM

## 2022-06-22 DIAGNOSIS — G9341 Metabolic encephalopathy: Secondary | ICD-10-CM

## 2022-06-22 DIAGNOSIS — J690 Pneumonitis due to inhalation of food and vomit: Secondary | ICD-10-CM

## 2022-06-22 DIAGNOSIS — J189 Pneumonia, unspecified organism: Secondary | ICD-10-CM | POA: Diagnosis not present

## 2022-06-22 DIAGNOSIS — Z515 Encounter for palliative care: Secondary | ICD-10-CM | POA: Diagnosis not present

## 2022-06-22 DIAGNOSIS — Z7189 Other specified counseling: Secondary | ICD-10-CM | POA: Diagnosis not present

## 2022-06-22 DIAGNOSIS — G7111 Myotonic muscular dystrophy: Secondary | ICD-10-CM | POA: Diagnosis not present

## 2022-06-22 DIAGNOSIS — E669 Obesity, unspecified: Secondary | ICD-10-CM | POA: Diagnosis present

## 2022-06-22 DIAGNOSIS — Z85850 Personal history of malignant neoplasm of thyroid: Secondary | ICD-10-CM

## 2022-06-22 DIAGNOSIS — R627 Adult failure to thrive: Secondary | ICD-10-CM

## 2022-06-22 DIAGNOSIS — E876 Hypokalemia: Secondary | ICD-10-CM | POA: Diagnosis present

## 2022-06-22 LAB — CBC
HCT: 35.9 % — ABNORMAL LOW (ref 39.0–52.0)
Hemoglobin: 11.2 g/dL — ABNORMAL LOW (ref 13.0–17.0)
MCH: 31.5 pg (ref 26.0–34.0)
MCHC: 31.2 g/dL (ref 30.0–36.0)
MCV: 100.8 fL — ABNORMAL HIGH (ref 80.0–100.0)
Platelets: 169 10*3/uL (ref 150–400)
RBC: 3.56 MIL/uL — ABNORMAL LOW (ref 4.22–5.81)
RDW: 15.6 % — ABNORMAL HIGH (ref 11.5–15.5)
WBC: 6.5 10*3/uL (ref 4.0–10.5)
nRBC: 0 % (ref 0.0–0.2)

## 2022-06-22 LAB — COMPREHENSIVE METABOLIC PANEL
ALT: 11 U/L (ref 0–44)
AST: 13 U/L — ABNORMAL LOW (ref 15–41)
Albumin: 2.5 g/dL — ABNORMAL LOW (ref 3.5–5.0)
Alkaline Phosphatase: 53 U/L (ref 38–126)
Anion gap: 9 (ref 5–15)
BUN: 13 mg/dL (ref 8–23)
CO2: 27 mmol/L (ref 22–32)
Calcium: 8.9 mg/dL (ref 8.9–10.3)
Chloride: 107 mmol/L (ref 98–111)
Creatinine, Ser: 0.49 mg/dL — ABNORMAL LOW (ref 0.61–1.24)
GFR, Estimated: >60 mL/min (ref >60)
Glucose, Bld: 73 mg/dL (ref 70–99)
Potassium: 4 mmol/L (ref 3.5–5.1)
Sodium: 143 mmol/L (ref 135–145)
Total Bilirubin: 1.2 mg/dL (ref 0.3–1.2)
Total Protein: 5.9 g/dL — ABNORMAL LOW (ref 6.5–8.1)

## 2022-06-22 LAB — MAGNESIUM: Magnesium: 2 mg/dL (ref 1.7–2.4)

## 2022-06-22 MED ORDER — GLYCOPYRROLATE 0.2 MG/ML IJ SOLN: 0.2000 mg | INTRAMUSCULAR | Status: DC | PRN

## 2022-06-22 MED ORDER — LORAZEPAM 2 MG/ML IJ SOLN
1.0000 mg | INTRAMUSCULAR | Status: DC | PRN
  Administered 2022-06-22: 1 mg via INTRAVENOUS
  Filled 2022-06-22: qty 1

## 2022-06-22 MED ORDER — LORAZEPAM 2 MG/ML PO CONC
1.0000 mg | ORAL | Status: DC | PRN
  Filled 2022-06-22: qty 0.5

## 2022-06-22 MED ORDER — METRONIDAZOLE 500 MG/100ML IV SOLN
500.0000 mg | Freq: Two times a day (BID) | INTRAVENOUS | Status: DC
  Administered 2022-06-22: 500 mg via INTRAVENOUS
  Filled 2022-06-22: qty 100

## 2022-06-22 MED ORDER — ONDANSETRON 4 MG PO TBDP: 4.0000 mg | ORAL_TABLET | Freq: Four times a day (QID) | ORAL | Status: DC | PRN

## 2022-06-22 MED ORDER — LEVALBUTEROL HCL 0.63 MG/3ML IN NEBU
0.6300 mg | INHALATION_SOLUTION | Freq: Three times a day (TID) | RESPIRATORY_TRACT | Status: DC
Start: 2022-06-22 — End: 2022-06-22
  Filled 2022-06-22 (×2): qty 3

## 2022-06-22 MED ORDER — LIP MEDEX EX OINT
TOPICAL_OINTMENT | CUTANEOUS | Status: DC
Start: 2022-06-22 — End: 2022-06-22
  Administered 2022-06-22 (×2): 75 via TOPICAL
  Filled 2022-06-22: qty 7

## 2022-06-22 MED ORDER — POLYVINYL ALCOHOL 1.4 % OP SOLN: 1.0000 [drp] | Freq: Four times a day (QID) | OPHTHALMIC | Status: DC | PRN

## 2022-06-22 MED ORDER — FLEET ENEMA 7-19 GM/118ML RE ENEM: 1.0000 | ENEMA | Freq: Every day | RECTAL | Status: DC | PRN

## 2022-06-22 MED ORDER — HYDROMORPHONE HCL 1 MG/ML IJ SOLN
0.5000 mg | INTRAMUSCULAR | Status: DC | PRN
  Administered 2022-06-22 – 2022-06-23 (×2): 1 mg via INTRAVENOUS
  Filled 2022-06-22 (×2): qty 1

## 2022-06-22 MED ORDER — KETOROLAC TROMETHAMINE 30 MG/ML IJ SOLN
30.0000 mg | Freq: Once | INTRAMUSCULAR | Status: DC
  Filled 2022-06-22: qty 1

## 2022-06-22 MED ORDER — ACETAMINOPHEN 500 MG PO TABS: 1000.0000 mg | ORAL_TABLET | Freq: Two times a day (BID) | ORAL | Status: DC

## 2022-06-22 MED ORDER — FUROSEMIDE 10 MG/ML IJ SOLN
60.0000 mg | Freq: Once | INTRAMUSCULAR | Status: AC
  Administered 2022-06-22: 60 mg via INTRAVENOUS
  Filled 2022-06-22: qty 6

## 2022-06-22 MED ORDER — BISACODYL 10 MG RE SUPP: 10.0000 mg | Freq: Every day | RECTAL | Status: DC | PRN

## 2022-06-22 MED ORDER — SODIUM CHLORIDE 0.9 % IV SOLN
500.0000 mg | INTRAVENOUS | Status: DC
  Administered 2022-06-22: 500 mg via INTRAVENOUS
  Filled 2022-06-22: qty 5

## 2022-06-22 MED ORDER — FENTANYL CITRATE PF 50 MCG/ML IJ SOSY: 12.5000 ug | PREFILLED_SYRINGE | INTRAMUSCULAR | Status: DC | PRN

## 2022-06-22 MED ORDER — FLEET ENEMA 7-19 GM/118ML RE ENEM: 1.0000 | ENEMA | Freq: Once | RECTAL | Status: DC

## 2022-06-22 MED ORDER — ONDANSETRON HCL 4 MG/2ML IJ SOLN: 4.0000 mg | Freq: Four times a day (QID) | INTRAMUSCULAR | Status: DC | PRN

## 2022-06-22 MED ORDER — GLYCOPYRROLATE 1 MG PO TABS: 1.0000 mg | ORAL_TABLET | ORAL | Status: DC | PRN

## 2022-06-22 MED ORDER — IPRATROPIUM BROMIDE 0.02 % IN SOLN
0.5000 mg | Freq: Three times a day (TID) | RESPIRATORY_TRACT | Status: DC
  Administered 2022-06-22: 0.5 mg via RESPIRATORY_TRACT
  Filled 2022-06-22: qty 2.5

## 2022-06-22 MED ORDER — SCOPOLAMINE 1 MG/3DAYS TD PT72
1.0000 | MEDICATED_PATCH | TRANSDERMAL | Status: DC
  Administered 2022-06-22: 1.5 mg via TRANSDERMAL
  Filled 2022-06-22: qty 1

## 2022-06-22 MED ORDER — LORAZEPAM 1 MG PO TABS: 1.0000 mg | ORAL_TABLET | ORAL | Status: DC | PRN

## 2022-06-22 MED ORDER — GLYCOPYRROLATE 0.2 MG/ML IJ SOLN
0.4000 mg | Freq: Four times a day (QID) | INTRAMUSCULAR | Status: DC
  Administered 2022-06-22 (×2): 0.4 mg via INTRAVENOUS
  Filled 2022-06-22 (×3): qty 2

## 2022-06-22 MED ORDER — BIOTENE DRY MOUTH MT LIQD: 15.0000 mL | OROMUCOSAL | Status: DC | PRN

## 2022-06-22 MED ORDER — VANCOMYCIN HCL 750 MG/150ML IV SOLN
750.0000 mg | Freq: Two times a day (BID) | INTRAVENOUS | Status: DC
  Filled 2022-06-22: qty 150

## 2022-06-22 MED ORDER — SODIUM CHLORIDE 0.9 % IV SOLN
2.0000 g | Freq: Three times a day (TID) | INTRAVENOUS | Status: DC
  Administered 2022-06-22 – 2022-06-23 (×2): 2 g via INTRAVENOUS
  Filled 2022-06-22 (×3): qty 12.5

## 2022-06-22 NOTE — Progress Notes (Signed)
Pt placed on 100% NRB. MD at bedside wants to try HF salter. Pt sats would not maintain. No further orders at this time.

## 2022-06-22 NOTE — Progress Notes (Signed)
Spoke with MD about patient secretions. Did not feel comfortable trying to give him medicine by mouth due to his inability to talk because of oversecretions and feared choking and aspiration. I did attempt to suction secretions with yanker with little to no success. Ok to hold meds per MD.

## 2022-06-22 NOTE — Progress Notes (Signed)
Patient yellow MEWS d/t HR. Patient resting comfortably. MD made aware. No new orders. Will begin MEWS protocol.

## 2022-06-22 NOTE — TOC Initial Note (Signed)
Transition of Care [facility]) - Initial/Assessment Note   Patient Details  Name: [name] MRN: [id number] Date of Birth: Jan 26, 1961  Transition [facility] CM/SW Contact:    Sherie Don, LCSW Phone Number: 06/22/2022, 12:45 PM  Clinical Narrative: [facility] consulted for hospice referral. CSW spoke with wife as patient is currently oriented to self only. Wife is agreeable to hospice referral and requested [facility] as patient is already active with the palliative program. CSW made hospice referral to [facility] with [facility]. Hospice to follow up with wife regarding DME needs. TOC to follow.  Expected Discharge Plan: Home w Hospice Care Barriers to Discharge: Continued Medical Work up  Patient Goals and CMS Choice Patient states their goals for this hospitalization and ongoing recovery are:: Return home with hospice through [facility] Medicare.gov Compare Post Acute Care list provided to:: Patient Represenative (must comment) Choice offered to / list presented to : Spouse  Expected Discharge Plan and Services Expected Discharge Plan: Home w Hospice Care In-house Referral: Clinical Social Work Post Acute Care Choice: Hospice Living arrangements for the past 2 months: [place]           DME Arranged:  (DME will be set up through [facility])  Prior Living Arrangements/Services Living arrangements for the past 2 months: [place] with:: Spouse Patient language and need for interpreter reviewed:: Yes Do you feel safe going back to the place where you live?: Yes      Need for Family Participation in Patient Care: Yes (Comment) Care giver support system in place?: Yes (comment) Criminal Activity/Legal Involvement Pertinent to Current Situation/Hospitalization: No - Comment as needed  Activities of Daily Living Home Assistive Devices/Equipment: Civil Service fast streamer, [facility] bed, Wheelchair, Environmental consultant (specify type), Bedside commode/3-in-1, Shower chair  with back (lift chair, stair lift) ADL Screening (condition at time of admission) Patient's cognitive ability adequate to safely complete daily activities?: No Is the patient deaf or have difficulty hearing?: No Does the patient have difficulty seeing, even when wearing glasses/contacts?: No Does the patient have difficulty concentrating, remembering, or making decisions?: Yes Patient able to express need for assistance with ADLs?: Yes Does the patient have difficulty dressing or bathing?: No Independently performs ADLs?: No Communication: Independent Dressing (OT): Needs assistance Is this a change from baseline?: Pre-admission baseline Grooming: Needs assistance Is this a change from baseline?: Pre-admission baseline Feeding: Needs assistance Is this a change from baseline?: Pre-admission baseline Bathing: Needs assistance Is this a change from baseline?: Pre-admission baseline Toileting: Needs assistance Is this a change from baseline?: Pre-admission baseline In/Out Bed: Needs assistance Is this a change from baseline?: Pre-admission baseline Walks in Home: Dependent (cooule pivot to chair until yesterday) Is this a change from baseline?: Pre-admission baseline Does the patient have difficulty walking or climbing stairs?: Yes Weakness of Legs: Both Weakness of Arms/Hands: Both  Permission Sought/Granted Permission sought to share information with : Other (comment) Permission granted to share information with : Yes, Verbal Permission Granted Permission granted to share info w AGENCY: [facility] (hospice)  Emotional Assessment Orientation: : Oriented to Self Alcohol / Substance Use: Not Applicable  Admission diagnosis:  Myotonic muscular dystrophy ([place]) [G71.11] Bilateral pneumonia [J18.9] Community acquired pneumonia, unspecified laterality [J18.9] Patient Active Problem List   Diagnosis Date Noted   Bilateral pneumonia 06/17/2022   Palliative care encounter 05/15/2022    Needs assistance with community resources 05/15/2022   Oropharyngeal dysphagia    Aspiration into airway 05/12/2021   Dyspnea    OSA (  obstructive sleep apnea) 09/06/2019   Myotonic dystrophy ([place])    Abnormal chest x-ray    Abnormal CT scan of lung    Debility    Leukopenia    Encephalopathy 92/42/6834   Acute metabolic encephalopathy 19/62/2297   Pulmonary nodule 10/31/2018   Generalized weakness 10/31/2018   Fall 10/31/2018   Weakness    Aspiration pneumonia ([place]) 05/09/2017   History of fall 05/09/2017   Viral gastroenteritis 03/13/2014   Autonomic dysfunction 09/05/2013   Tachycardia 02/20/2013   ADD (attention deficit disorder) 08/15/2012   Depression 08/15/2012   Carcinoma of thyroid gland ([place]) 98/92/1194   Nonalcoholic fatty liver disease 01/23/2012   History of thyroid cancer    Dizziness, nonspecific 12/07/2011   Hypothyroidism 12/07/2011   DEPRESSION 03/20/2008   Myotonic muscular dystrophy ([place]) 03/20/2008   GERD 03/20/2008   PCP:  Tammi Sou, MD Pharmacy:   [place] [place] [address] Phone: [phone] Fax: [phone]  Readmission Risk Interventions     No data to display

## 2022-06-22 NOTE — IPAL (Signed)
  Interdisciplinary Goals of Care Family Meeting   Date carried out: 06/22/2022  Location of the meeting: Bedside  Member's involved: Physician, Bedside Registered Nurse, and Family Member or next of kin  Durable Power of Attorney or acting medical decision maker: Patient's wife    Discussion: We discussed goals of care for [name] .   Patient continues to require 15 L via nonrebreather.  He is somewhat agitated and restless pulling off nonreabreather.  His wife devastated and tearful at bedside.  We have discussed options including escalation of care, continuing current level of care or transition to full comfort care.  atient's wife thinks it is time to move forward with full comfort care.  We have discussed about full comfort care including use of medications such as morphine or its derivatives, IV Ativan and other medications as needed to manage discomfort, pain, air hunger and agitation with an understanding that we could suppress his respiratory drive which could lead to death but with less suffering.  No NIPPV, no blood draws or further testing.  Okay to continue broad-spectrum antibiotics for the time being.  I have initiated full comfort care with IV Dilaudid, as needed Ativan and other medications.  We will continue supplemental oxygen for comfort.  We will continue broad-spectrum antibiotics for now  Code status: Full DNR  Disposition: In-patient comfort care  Time spent for the meeting: 30 minutes    Mercy Riding, MD  06/22/2022, 6:12 PM

## 2022-06-22 NOTE — Consult Note (Signed)
Consultation Note Date: 06/22/2022   Patient Name: [name]  DOB: 14-Jun-1961  MRN: [id number]  Age / Sex: 61 y.o., male  PCP: Tammi Sou, MD Referring Physician: Mercy Riding, MD  Reason for Consultation: Establishing goals of care  HPI/Patient Profile: 61 y.o. male  with past medical history of myotonic dystrophy type 1, aspiration pneumonia, history of thyroid cancer, hypothyroidism, ADD, depression, total care and wife primary caregiver at home admitted on 06/04/2022 with altered mental status/confusion, cough x 2-3 days, fever related to bilateral pneumonia L > R.   Clinical Assessment and Goals of Care: I met today with [name] and wife [name] at bedside after reviewing records and hospitalization. Outpatient palliative care notes reviewed as well. [name] is awake but appears very fatigued and weakened. He attempts to speak but difficult to understand due to his secretions. [name] and I had a long discussion regarding [name]'s progressive decline and declining quality of life. We discussed his current status and goals of care. Although [name] cannot fully participate [name] knows his wishes as they have discussed these over time of his illness and decline. [name] shares confirmation of DNR status. She shares that he did not really want to come to the hospital but she did not know what to do when he declined at home so she called EMS. I reassured her that calling EMS was really her only option as she did not have the tools or resources to provide him care at home and we reviewed how he would not want to be at home suffering.   [name] wishes to continue current conservative care. She knows he would not want BiPAP or any interventions to escalate to ICU level care but wants to treat what we can with medications, IVF, antibiotics, oxygen. She does not want him to suffer and wishes to treat symptoms as they arise for  his comfort. We discussed use of hospice at time of discharge to support them at home. We completed MOST form: DNR, comfort measures (no rehospitalization), determine benefits/limitations of antibiotics, no IVF, no feeding tube. At this time we will continue conservative treatment and supportive care. Discussed with [name] about discussing with their son in [place] to assist with visit home sooner than later. I provided note for son for his work to excuse him for visit.   All questions/concerns addressed. Emotional support provided.   Update: Called and spoke again with [name] after notified by RN that [name]'s oxygen was dropping. [name] and I spoke but she had just spoken with Dr. Cyndia Skeeters about continuing antibiotics and oxygen. [name] shares that she is worried that [name] is becoming worked up and frustrated trying to have a bowel movement. Will order enema based on conversation. [name] continues to agree with comfort medication as indicated including ativan or haldol and pain medication. She has not spoken with their son yet and I encourage her to speak with him and have him visit as soon as possible. I have called chaplain for additional support for [facility].   Primary  Decision Maker NEXT OF KIN wife [name]    SUMMARY OF RECOMMENDATIONS   - DNR - Continue conservative and supportive care  - No desire for invasive/aggressive interventions or ICU level care - Maintain comfort while continuing to treat  Code Status/Advance Care Planning: DNR   Symptom Management:  Low dose fentanyl as needed for pain.  Robinul scheduled for secretions.   Palliative Prophylaxis:  Aspiration, Bowel Regimen, Delirium Protocol, Frequent Pain Assessment, Oral Care, and Turn Reposition  Additional Recommendations (Limitations, Scope, Preferences): Avoid Hospitalization, No Artificial Feeding, and No Surgical Procedures  Prognosis:  Overall prognosis poor. Watchful waiting. Hospice at home if able to discharge.   Discharge  Planning: Home with Hospice      Primary Diagnoses: Present on Admission:  Bilateral pneumonia  Myotonic muscular dystrophy ([place])  Hypothyroidism  Depression  Encephalopathy   I have reviewed the medical record, interviewed the patient and family, and examined the patient. The following aspects are pertinent.  Past Medical History:  Diagnosis Date   Adult ADHD    AKI (acute kidney injury) ([place]) 10/31/2018   Aspiration pneumonitis ([place]) 04/2017, 08/2017   Recurrent   Chronic respiratory failure ([place])    As of 04/2018, Dr. Chase Caller suspects hypercapnic RF--but [name] normal.  May eventually need nocturnal noninvasive ventilation.  Ultimately, if progresses enough pt will need trach.   Constipation, chronic    Has caused irritative urinary/bladder symptoms in the past.   Depression    Dizziness 06/2011+   Noncontrast CT and noncontrast MRI both negative 06/2011 at [facility] ED visit.   Elevated transaminase level    Liver biopsy revealed fatty liver   Erectile dysfunction    Esophageal dysmotility    Barium swallow w/out stricture 04/2021. Pt is aspirating   Facial fracture ([place]) 06/2011   Nondisplaced bilateral [place] I fracture: no surgery required. (ENT, Dr. Wilburn Cornelia)    Fatty liver disease, nonalcoholic    GERD (gastroesophageal reflux disease)    History of thyroid cancer 2010   Medullary carcinoma of thyroid. Thyroidectomy 08/2009 (Dr. Aida Puffer at [facility]).  Needs periodic serum calcitonin monitoring.   Hyperlipidemia    Hypothyroidism    LVH (left ventricular hypertrophy)    by EKG criteria 06/2011.  Hx of heart murmur--Followed by cardiology at [facility]: EF's have been "stable" per cardio office note 12/29/10.  His echo 12/2012 showed normal LV size and wall thickness but EF 45-50% and global LV hypokinesis was found.   Myotonic dystrophy ([place]) Dx'd 1996   Progressive muscle weakness and swallowing dysfunction.  Initial dx by Dr. Jannifer Franklin, neurologist, but subsequent annual specialist f/u  has been with Dr. Ladon Applebaum [facility].  Progressive debilitation--PT referral 04/2017, pt quit PT 05/2017.   Nephrolithiasis    Obesity, Class I, BMI 30-34.9    OSA (obstructive sleep apnea)    SEVERE 08/2019 sleep study). CPAP to be initiated as of 04/18/20 pulm (Dr. Annamaria Boots). Bipap initiated in hosp 04/2021, home on this as well.   Pulmonary nodule, right 05/2017   RUL, 7 mm sub solid nodule.  Resolution noted on f/u CT 08/2017.   Social History   Socioeconomic History   Marital status: Married    Spouse name: Dalten Ambrosino   Number of children: 1   Years of education: College   Highest education level: Bachelor's degree (e.g., BA, AB, BS)  Occupational History   Occupation: Disabled  Tobacco Use   Smoking status: Never   Smokeless tobacco: Never  Vaping Use   Vaping Use:  Never used  Substance and Sexual Activity   Alcohol use: Yes    Comment: rare beer   Drug use: No   Sexual activity: Not Currently  Other Topics Concern   Not on file  Social History Narrative   Married, one son [name]).   Occupation: Market researcher, currently unemployed, working on appeal for medical disability as of 11/2011.  No tob or drugs.  Drinks alcohol socially.   Social Determinants of Health   Financial Resource Strain: Low Risk  (11/28/2021)   Overall Financial Resource Strain (CARDIA)    Difficulty of Paying Living Expenses: Not very hard  Food Insecurity: No Food Insecurity (11/28/2021)   Hunger Vital Sign    Worried About Running Out of Food in the Last Year: Never true    Ran Out of Food in the Last Year: Never true  Transportation Needs: No Transportation Needs (11/28/2021)   PRAPARE - Hydrologist (Medical): No    Lack of Transportation (Non-Medical): No  Physical Activity: Unknown (11/28/2021)   Exercise Vital Sign    Days of Exercise per Week: 0 days    Minutes of Exercise per Session: Not on file  Stress: Stress Concern Present (11/28/2021)   [place]    Feeling of Stress : To some extent  Social Connections: Moderately Isolated (11/28/2021)   Social Connection and Isolation Panel [NHANES]    Frequency of Communication with Friends and Family: Twice a week    Frequency of Social Gatherings with Friends and Family: Once a week    Attends Religious Services: Never    Marine scientist or Organizations: No    Attends Music therapist: Not on file    Marital Status: Married   Family History  Problem Relation Age of Onset   Arthritis Mother    Hypertension Mother    Alcohol abuse Father    Arthritis Father    Hypertension Father    Colon cancer Father    Prostate cancer Father    Heart disease Brother    Scheduled Meds:  amphetamine-dextroamphetamine  30 mg Oral Daily   docusate sodium  100 mg Oral BID   enoxaparin (LOVENOX) injection  40 mg Subcutaneous Q24H   FLUoxetine  60 mg Oral Daily   levothyroxine  150 mcg Oral QAC breakfast   melatonin  10 mg Oral QHS   pantoprazole  40 mg Oral Daily   polyethylene glycol  17 g Oral Daily   risperiDONE  0.5 mg Oral QHS   traZODone  25 mg Oral QHS   Continuous Infusions:  0.9 % NaCl with KCl 20 mEq / L 100 mL/hr at 06/22/22 0524   ampicillin-sulbactam (UNASYN) IV 3 g (06/22/22 0518)   azithromycin 500 mg (06/04/2022 2123)   PRN Meds:.acetaminophen **OR** acetaminophen, albuterol, glycopyrrolate, ondansetron **OR** ondansetron (ZOFRAN) IV No Known Allergies Review of Systems  Unable to perform ROS: Acuity of condition    Physical Exam Vitals and nursing note reviewed.  Constitutional:      Appearance: He is ill-appearing.  Cardiovascular:     Rate and Rhythm: Normal rate.  Pulmonary:     Effort: No tachypnea, accessory muscle usage or respiratory distress.     Comments: Copious secretions unable to extract with yankeur suction Abdominal:     Palpations: Abdomen is soft.  Neurological:      Mental Status: He is alert. He is confused.  Vital Signs: BP (!) 106/58 (BP Location: Right Arm)   Pulse 96   Temp 98 F (36.7 C) (Oral)   Resp 17   SpO2 90%  Pain Scale: Faces       SpO2: SpO2: 90 % O2 Device:SpO2: 90 % O2 Flow Rate: .O2 Flow Rate (L/min): 2 L/min  IO: Intake/output summary:  Intake/Output Summary (Last 24 hours) at 06/22/2022 0935 Last data filed at 06/22/2022 0548 Gross per 24 hour  Intake 1303.32 ml  Output 700 ml  Net 603.32 ml    LBM:   Baseline Weight:   Most recent weight:       Palliative Assessment/Data: 30% at best     Time In: 1000  Time Total: 90 MIN  Greater than 50%  of this time was spent counseling and coordinating care related to the above assessment and plan.  Signed by: Vinie Sill, NP Palliative Medicine Team Pager # [phone] (M-F 8a-5p) Team Phone # [phone] (Nights/Weekends)

## 2022-06-22 NOTE — Progress Notes (Signed)
Pharmacy Antibiotic Note  [name] is a 61 y.o. male admitted on 06/04/2022 with pneumonia.  Pharmacy has been consulted for vancomycin dosing.  Plan: Vancomycin '750mg'$  IV q12h for estimated AUC 475 using SCr 0.8, Vd 0.5L/kg Check vancomycin levels at steady state, goal AUC 400-550 Cefepime/Flagyl/Azithromycin per MD Follow up renal function & cultures   Height: '5\' 5"'$  (165.1 cm) Weight: 84.8 kg (186 lb 15.2 oz) IBW/kg (Calculated) : 61.5  Temp (24hrs), Avg:98.4 F (36.9 C), Min:98 F (36.7 C), Max:98.8 F (37.1 C)  Recent Labs  Lab 06/11/2022 1441 06/22/22 0436  WBC 7.5 6.5  CREATININE 0.61 0.49*  LATICACIDVEN 0.9  --     Estimated Creatinine Clearance: 97.1 mL/min (A) (by C-G formula based on SCr of 0.49 mg/dL (L)).    No Known Allergies  Antimicrobials this admission: 6/27 CTX x 1 6/27 Unasyn >> 6/28 6/27 Azithro >> 6/28 Cefepime >> 6/28 Flagyl >> 6/28 Vancomycin >>  Dose adjustments this admission:  Microbiology results: 6/27 BCx: ngtd 6/27 COVID: neg 6/27 Influenza: neg  Thank you for allowing pharmacy to be a part of this patient's care.  Peggyann Juba, PharmD, BCPS Pharmacy: [phone] 06/22/2022 4:17 PM

## 2022-06-22 NOTE — Progress Notes (Signed)
Comfort care started.

## 2022-06-22 NOTE — Progress Notes (Signed)
Notified by RN about patient saturating at 65% on 5 L by nasal cannula.  When I went to see patient, patient was lying in bed on bedpan.  Patient's wife at bedside.  He is on nonrebreather.  No significant work of breathing but rhonchorous on wet.  Slightly tachycardic to 110s.  More confused and trying to get up in the bed.   Vitals:   06/22/22 0858 06/22/22 1231 06/22/22 1400 06/22/22 1555  BP: (!) 106/58 128/81 131/69 121/68  Pulse: 96 (!) 123 (!) 111 (!) 124  Resp: '17 17 18 18  '$ Temp: 98 F (36.7 C) 98 F (36.7 C) 98.7 F (37.1 C) 98.4 F (36.9 C)  TempSrc: Oral Oral Oral Oral  SpO2: 90% (!) 81% (!) 87% (!) 70%  Weight:  84.8 kg    Height:  '5\' 5"'$  (1.651 m)     GENERAL: No apparent distress.  Nontoxic. HEENT: MMM.  Vision and hearing grossly intact.  NECK: Supple.  No apparent JVD.  RESP: On NRB.  Rhonchorous on weight.  No work of breathing. CVS: Lightly tachycardic to 110s.  Heart sounds normal.  ABD/GI/GU: BS+. Abd soft, NTND.  MSK/EXT:  Moves extremities. No apparent deformity. No edema.  SKIN: no apparent skin lesion or wound NEURO: Awake and alert.  Confused.  Trying to get up in the bed.  Moves all extremities. PSYCH: Somewhat restless but redirectable.  Assessment and plan Acute respiratory failure with hypoxia-progressive hypoxia likely due to multifocal pneumonia. Does not appear fluid overloaded on exam.  Not able to exclude PE.  Discussed with patient's wife at bedside.  She does not want NIPPV or CTA chest  Okay with trial of IV Lasix and broad-spectrum IV antibiotics and focus on comfort and quality.  Her hope is to give him a chance to see their son who is coming from [place] but she understands that he might not have much time.  -Requested RN to check saturation on forehead or ears see if that makes a difference -Discontinue IV fluid -IV Lasix 60 mg x 1 -Start CXR and KUB -Check VBG, BNP and D-dimer. -Broad-spectrum antibiotics with vancomycin, cefepime and  Flagyl.  Continue azithromycin as well -Appreciate help by palliative medicine -Change NRB to high flow nasal cannula -Scheduled nebulizer with Atrovent and ipratropium   CRITICAL CARE Performed by: Mercy Riding   Total critical care time: 45 minutes  Critical care time was exclusive of separately billable procedures and treating other patients.  Critical care was necessary to treat or prevent imminent or life-threatening deterioration.  Critical care was time spent personally by me on the following activities: development of treatment plan with patient and/or surrogate as well as nursing, discussions with consultants, evaluation of patient's response to treatment, examination of patient, obtaining history from patient or surrogate, ordering and performing treatments and interventions, ordering and review of laboratory studies, ordering and review of radiographic studies, pulse oximetry and re-evaluation of patient's condition.

## 2022-06-22 NOTE — Progress Notes (Signed)
PROGRESS NOTE  [name] FBP:[id number] DOB: [birth year]   PCP: Jeoffrey Massed, MD  Patient is from: Home.  Lives with his wife.  Bedbound with total care at home.  Followed by palliative at home.  DOA: 06/01/2022 LOS: 1  Chief complaints Chief Complaint  Patient presents with   Altered Mental Status     Brief Narrative / Interim history: 61 year old M with PMH of myotonic dystrophy with total care at home, thyroid cancer, HTN, hypothyroidism, depression, ADHD and aspiration pneumonitis presenting with with cough and fever, and admitted for multifocal pneumonia.    In ED, slightly tachypneic to 20s.  Other vital stable.  Chest x-ray showed bilateral pneumonia, left greater than right.  K3.4.  WBC 7.5.  Lactic acid 0.9.  Hgb 11.9.  COVID-19 and influenza PCR nonreactive.  UA with moderate Hgb and 5 ketones.  Blood cultures obtained.  Patient was started on IV Unasyn for possible aspiration pneumonia and azithromycin for possible atypical infection.  Palliative medicine consulted for goals of care discussion.  The next day, desaturated to 81% requiring supplemental oxygen.  Palliative met with patient and patient's wife.  Plan is to continue antibiotics and IV fluid, emphasis on comfort and quality of life but no escalation of care or transfer to ICU if worse.   Subjective: Seen and examined earlier this morning.  No major events overnight.   Objective: Vitals:   06/22/22 0131 06/22/22 0858 06/22/22 1231 06/22/22 1400  BP: 121/69 (!) 106/58 128/81 131/69  Pulse: 87 96 (!) 123 (!) 111  Resp: 17 17 17 18   Temp: 98.8 F (37.1 C) 98 F (36.7 C) 98 F (36.7 C) 98.7 F (37.1 C)  TempSrc: Oral Oral Oral Oral  SpO2: 99% 90% (!) 81% (!) 87%  Weight:   84.8 kg   Height:   5\' 5"  (1.651 m)     Examination:  GENERAL: Appears frail.  Nontoxic. HEENT: MMM.  Vision and hearing grossly intact.  NECK: Supple.  No apparent JVD.  RESP: Somewhat gurgly.  No IWOB.  Very  rhonchorous. CVS:  RRR. Heart sounds normal.  ABD/GI/GU: BS+. Abd soft, NTND.  MSK/EXT:  Moves extremities but very weak. SKIN: no apparent skin lesion or wound NEURO: Awake and follows command.  Oriented to self and his wife.  Difficult to assess further orientation due to difficulty understanding his speech.  No facial asymmetry.  PERRL.  Generalized weakness all over. PSYCH: Calm. Normal affect.   Procedures:  None  Microbiology summarized: COVID-19 and influenza PCR nonreactive. Blood cultures NGTD.  Assessment and plan: Principal Problem:   Bilateral pneumonia Active Problems:   Myotonic muscular dystrophy (HCC)   Hypothyroidism   History of thyroid cancer   Depression   Aspiration pneumonia (HCC)   Encephalopathy   Debility   Goals of care, counseling/discussion   Acute respiratory failure with hypoxia (HCC)   Obesity (BMI 30-39.9)   Failure to thrive in adult   Hypokalemia  Acute respiratory failure with hypoxia due to aspiration pneumonia and myotonic muscular dystrophy-desaturated to 81% requiring supplemental oxygen.  No objective fever or leukocytosis but very high risk for aspiration due to myotonic muscular dystrophy.  He has increased oropharyngeal secretion.  He is at increased risk of aspiration.  Chest x-ray with multifocal pneumonia. -Continue IV Unasyn -Added Xopenex and Atrovent -Continue Robinul 1 mg 3 times daily as needed with saturation -Added scopolamine patch -Portable chest x-ray, VBG and BNP -Continue dysphagia 1 diet pending SLP eval -Appreciate help  by palliative medicine.  No escalation of care or transfer to ICU if worse.  Emphasis on comfort and quality, and transfer home with home hospice  Debility due to myotonic muscular dystrophy/failure to thrive: Patient is total dependent at home.  Wife is a primary caregiver.  Recently started palliative service.  Acute metabolic encephalopathy: Likely due to the above.  He is awake and oriented to  self and person.  Further assessment limited by dysarthria and increased oral secretion.  It is difficult to understand his speech. -Treat treatable causes -Reorientation and delirium precaution  Normocytic anemia: Stable.  Goal of care counseling: DNR/DNI. No escalation of care or transfer to ICU if worse.  Emphasis on comfort and quality, and transfer home with home hospice -Appreciate help by palliative medicine  Hypothyroidism -Continue home Synthroid  Hypokalemia: Resolved.  History of depression: Continue home meds.  Obesity Body mass index is 31.11 kg/m.           DVT prophylaxis:  enoxaparin (LOVENOX) injection 40 mg Start: 06/19/2022 2200  Code Status: DNR/DNI Family Communication: Updated patient's wife at bedside Level of care: Med-Surg Status is: Inpatient Remains inpatient appropriate because: Due to multifocal pneumonia, failure to thrive   Final disposition: Home with home hospice if stable Consultants:  Palliative medicine  Sch Meds:  Scheduled Meds:  acetaminophen  1,000 mg Oral Q12H   amphetamine-dextroamphetamine  30 mg Oral Daily   docusate sodium  100 mg Oral BID   enoxaparin (LOVENOX) injection  40 mg Subcutaneous Q24H   FLUoxetine  60 mg Oral Daily   glycopyrrolate  0.4 mg Intravenous QID   levalbuterol  0.63 mg Nebulization Q8H   And   ipratropium  0.5 mg Nebulization Q8H   levothyroxine  150 mcg Oral QAC breakfast   lip balm   Topical Q4H   melatonin  10 mg Oral QHS   pantoprazole  40 mg Oral Daily   polyethylene glycol  17 g Oral Daily   risperiDONE  0.5 mg Oral QHS   scopolamine  1 patch Transdermal Q72H   traZODone  25 mg Oral QHS   Continuous Infusions:  0.9 % NaCl with KCl 20 mEq / L 100 mL/hr at 06/22/22 0524   ampicillin-sulbactam (UNASYN) IV 3 g (06/22/22 1226)   azithromycin 500 mg (06/01/2022 2123)   PRN Meds:.albuterol, fentaNYL (SUBLIMAZE) injection, ondansetron **OR** ondansetron (ZOFRAN)  IV  Antimicrobials: Anti-infectives (From admission, onward)    Start     Dose/Rate Route Frequency Ordered Stop   06/22/22 0000  Ampicillin-Sulbactam (UNASYN) 3 g in sodium chloride 0.9 % 100 mL IVPB        3 g 200 mL/hr over 30 Minutes Intravenous Every 6 hours 06/01/2022 1722     06/23/2022 1800  azithromycin (ZITHROMAX) 500 mg in sodium chloride 0.9 % 250 mL IVPB        500 mg 250 mL/hr over 60 Minutes Intravenous Every 24 hours 06/23/2022 1716     06/01/2022 1630  cefTRIAXone (ROCEPHIN) 2 g in sodium chloride 0.9 % 100 mL IVPB        2 g 200 mL/hr over 30 Minutes Intravenous  Once 06/03/2022 1623 06/23/2022 1736        I have personally reviewed the following labs and images: CBC: Recent Labs  Lab 06/13/2022 1441 06/22/22 0436  WBC 7.5 6.5  NEUTROABS 6.5  --   HGB 11.9* 11.2*  HCT 37.7* 35.9*  MCV 99.7 100.8*  PLT 155 169   BMP &GFR  Recent Labs  Lab 06/03/2022 1441 06/22/22 0436  NA 140 143  K 3.4* 4.0  CL 103 107  CO2 30 27  GLUCOSE 95 73  BUN 19 13  CREATININE 0.61 0.49*  CALCIUM 9.1 8.9  MG  --  2.0   Estimated Creatinine Clearance: 97.1 mL/min (A) (by C-G formula based on SCr of 0.49 mg/dL (L)). Liver & Pancreas: Recent Labs  Lab 06/07/2022 1441 06/22/22 0436  AST 15 13*  ALT 12 11  ALKPHOS 54 53  BILITOT 1.1 1.2  PROT 6.0* 5.9*  ALBUMIN 2.7* 2.5*   No results for input(s): "LIPASE", "AMYLASE" in the last 168 hours. No results for input(s): "AMMONIA" in the last 168 hours. Diabetic: No results for input(s): "HGBA1C" in the last 72 hours. No results for input(s): "GLUCAP" in the last 168 hours. Cardiac Enzymes: No results for input(s): "CKTOTAL", "CKMB", "CKMBINDEX", "TROPONINI" in the last 168 hours. No results for input(s): "PROBNP" in the last 8760 hours. Coagulation Profile: Recent Labs  Lab 06/17/2022 1441  INR 1.1   Thyroid Function Tests: No results for input(s): "TSH", "T4TOTAL", "FREET4", "T3FREE", "THYROIDAB" in the last 72 hours. Lipid  Profile: No results for input(s): "CHOL", "HDL", "LDLCALC", "TRIG", "CHOLHDL", "LDLDIRECT" in the last 72 hours. Anemia Panel: No results for input(s): "VITAMINB12", "FOLATE", "FERRITIN", "TIBC", "IRON", "RETICCTPCT" in the last 72 hours. Urine analysis:    Component Value Date/Time   COLORURINE AMBER (A) 05/27/2022 1934   APPEARANCEUR CLEAR 06/11/2022 1934   LABSPEC 1.021 05/28/2022 1934   PHURINE 6.0 06/12/2022 1934   GLUCOSEU NEGATIVE 06/02/2022 1934   HGBUR MODERATE (A) 06/01/2022 1934   BILIRUBINUR NEGATIVE 06/04/2022 1934   KETONESUR 5 (A) 06/12/2022 1934   PROTEINUR NEGATIVE 05/26/2022 1934   UROBILINOGEN 0.2 11/22/2007 2045   NITRITE NEGATIVE 06/07/2022 1934   LEUKOCYTESUR NEGATIVE 06/15/2022 1934   Sepsis Labs: Invalid input(s): "PROCALCITONIN", "LACTICIDVEN"  Microbiology: Recent Results (from the past 240 hour(s))  Culture, blood (Routine x 2)     Status: None (Preliminary result)   Collection Time: 06/16/2022  2:41 PM   Specimen: BLOOD  Result Value Ref Range Status   Specimen Description   Final    BLOOD BLOOD LEFT FOREARM Performed at [facility] [address], [place], [place] [address]    Special Requests   Final    BOTTLES DRAWN AEROBIC AND ANAEROBIC Blood Culture adequate volume Performed at [facility] [address], [place], [place] [address]    Culture   Final    NO GROWTH < 12 HOURS Performed at [facility] [address], [place], [place] [address]    Report Status PENDING  Incomplete  Culture, blood (Routine x 2)     Status: None (Preliminary result)   Collection Time: 06/06/2022  2:53 PM   Specimen: BLOOD  Result Value Ref Range Status   Specimen Description   Final    BLOOD BLOOD RIGHT FOREARM Performed at [facility] [address], [place], [place] [address]    Special Requests   Final    BOTTLES DRAWN AEROBIC AND ANAEROBIC Blood Culture adequate volume Performed at  [facility] [address], [place], [place] [address]    Culture   Final    NO GROWTH < 12 HOURS Performed at [facility] [address] [place], [place] [address]    Report Status PENDING  Incomplete  Resp Panel by RT-PCR (Flu A&B, Covid) Anterior Nasal  Swab     Status: None   Collection Time: 05/27/2022  5:13 PM   Specimen: Anterior Nasal Swab  Result Value Ref Range Status   SARS Coronavirus 2 by RT PCR NEGATIVE NEGATIVE Final    Comment: (NOTE) SARS-CoV-2 target nucleic acids are NOT DETECTED.  The SARS-CoV-2 RNA is generally detectable in upper respiratory specimens during the acute phase of infection. The lowest concentration of SARS-CoV-2 viral copies this assay can detect is 138 copies/mL. A negative result does not preclude SARS-Cov-2 infection and should not be used as the sole basis for treatment or other patient management decisions. A negative result may occur with  improper specimen collection/handling, submission of specimen other than nasopharyngeal swab, presence of viral mutation(s) within the areas targeted by this assay, and inadequate number of viral copies(<138 copies/mL). A negative result must be combined with clinical observations, patient history, and epidemiological information. The expected result is Negative.  Fact Sheet for Patients:  EntrepreneurPulse.com.au  Fact Sheet for Healthcare Providers:  IncredibleEmployment.be  This test is no t yet approved or cleared by the [place] FDA and  has been authorized for detection and/or diagnosis of SARS-CoV-2 by FDA under an Emergency Use Authorization (EUA). This EUA will remain  in effect (meaning this test can be used) for the duration of the COVID-19 declaration under Section 564(b)(1) of the Act, 21 U.S.C.section 360bbb-3(b)(1), unless the authorization is terminated  or revoked sooner.       Influenza A by PCR NEGATIVE NEGATIVE  Final   Influenza B by PCR NEGATIVE NEGATIVE Final    Comment: (NOTE) The Xpert Xpress SARS-CoV-2/FLU/RSV plus assay is intended as an aid in the diagnosis of influenza from Nasopharyngeal swab specimens and should not be used as a sole basis for treatment. Nasal washings and aspirates are unacceptable for Xpert Xpress SARS-CoV-2/FLU/RSV testing.  Fact Sheet for Patients: EntrepreneurPulse.com.au  Fact Sheet for Healthcare Providers: IncredibleEmployment.be  This test is not yet approved or cleared by the [place] FDA and has been authorized for detection and/or diagnosis of SARS-CoV-2 by FDA under an Emergency Use Authorization (EUA). This EUA will remain in effect (meaning this test can be used) for the duration of the COVID-19 declaration under Section 564(b)(1) of the Act, 21 U.S.C. section 360bbb-3(b)(1), unless the authorization is terminated or revoked.  Performed at [facility], [place] [address], [place], [place] [address]     Radiology Studies: [facility] Chest Port 1 View  Result Date: 05/29/2022 CLINICAL DATA:  Altered mental status.  Cough.  Fever. EXAM: PORTABLE CHEST 1 VIEW COMPARISON:  05/13/2021 FINDINGS: Numerous leads and wires project over the chest. Midline trachea. Mild cardiomegaly. No pleural effusion or pneumothorax. Diffuse left-sided and right basilar predominant airspace disease. IMPRESSION: Left-greater-than-right airspace disease, most consistent with pneumonia. Followup PA and lateral chest X-ray is recommended in 3-4 weeks following trial of antibiotic therapy to ensure resolution and exclude underlying malignancy. Electronically Signed   By: Abigail Miyamoto M.D.   On: 06/19/2022 15:20      Temeka Pore T. [place]  If 7PM-7AM, please contact night-coverage [email or web address] 06/22/2022, 2:50 PM

## 2022-06-22 NOTE — Progress Notes (Signed)
Chaplain engaged in an initial visit with [name].  Chaplain provided emotional grief support as Mrs. Dingley shared about Jeff's current condition and needing to relay information to her son.  Chaplain provided space for her to share and encouraged her to take time and space as needed.    Chaplain also engaged in [place], learning about [name] and Mrs. [place]'s relationship and union.  They have been married over 30 years and met through an advertisement.  From the moment they met each other they remained inseparable.  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  They have one son, [name], who lives in [place].    Chaplain provided reflective listening and a compassionate presence before being relieved by [employer]. Chaplain will follow-up tomorrow.     06/22/22 1700  Clinical Encounter Type  Visited With Patient and family together  Visit Type Initial;Spiritual support;Patient actively dying  Spiritual Encounters  Spiritual Needs Emotional;Grief support

## 2022-06-22 NOTE — Progress Notes (Signed)
[facility])  Referral received for hospice services at discharge.  Met with wife and patient, support provided and questions answered.  DME already in the home, hospital bed, hoyer lift, walker, WC, and 3n1.   DME needed: O2, [facility] will order.  Mr. Melhorn will require EMS transport home.   At discharge, please arrange for comfort meds at discharge so there is no lapse in his comfort prior to hospice arriving in the home.  Venia Carbon DNP, RN [facility] Liaison

## 2022-06-22 NOTE — Evaluation (Signed)
SLP Cancellation Note  Patient Details Name: [name] MRN: [id number] DOB: [birth year]   Cancelled treatment:       Reason Eval/Treat Not Completed: Other (comment) (per chart review, pt's RN not comfortable giving pt his medications due to difficulty managing secretions; will follow up next date to determine if may have swallow evaluation done at that time; note pt is for palliative referral per notes)   Macario Golds 06/22/2022, 11:26 AM   Kathleen Lime, MS [place] Office [phone] Pager [phone]

## 2022-06-23 DIAGNOSIS — G9341 Metabolic encephalopathy: Secondary | ICD-10-CM | POA: Diagnosis not present

## 2022-06-23 DIAGNOSIS — J189 Pneumonia, unspecified organism: Secondary | ICD-10-CM | POA: Diagnosis not present

## 2022-06-23 DIAGNOSIS — Z515 Encounter for palliative care: Secondary | ICD-10-CM

## 2022-06-23 DIAGNOSIS — Z7189 Other specified counseling: Secondary | ICD-10-CM | POA: Diagnosis not present

## 2022-06-23 DIAGNOSIS — R627 Adult failure to thrive: Secondary | ICD-10-CM | POA: Diagnosis not present

## 2022-06-23 DIAGNOSIS — J9601 Acute respiratory failure with hypoxia: Secondary | ICD-10-CM | POA: Diagnosis not present

## 2022-06-23 LAB — BLOOD CULTURE ID PANEL (REFLEXED) - BCID2

## 2022-06-23 MED ORDER — HYDROMORPHONE HCL 1 MG/ML IJ SOLN
0.5000 mg | INTRAMUSCULAR | Status: DC | PRN
  Administered 2022-06-23 (×5): 1 mg via INTRAVENOUS
  Administered 2022-06-23: 0.5 mg via INTRAVENOUS
  Administered 2022-06-24 (×3): 1 mg via INTRAVENOUS
  Filled 2022-06-23 (×9): qty 1

## 2022-06-23 MED ORDER — HYDROMORPHONE HCL 1 MG/ML IJ SOLN: 1.0000 mg | INTRAMUSCULAR | Status: DC | PRN

## 2022-06-23 NOTE — Progress Notes (Signed)
Chaplain provided follow up support to [place] and prayer, at [facility] request.  Their son is driving in tonight and we clarified that he would come in to the ED entrance since the main entrance will close after visiting hours.  Chaplain met Jeff's brother and his wife as well and provided support to them.  [address] [place] Pager, [phone]

## 2022-06-23 NOTE — Progress Notes (Signed)
PROGRESS NOTE  BRIGHTON PILLEY OTL:[id number] DOB: [birth year]   PCP: Tammi Sou, MD  Patient is from: Home.  Lives with his wife.  Bedbound with total care at home.  Followed by palliative at home.  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    In ED, slightly tachypneic to 20s.  Other vital stable.  Chest x-ray showed bilateral pneumonia, left greater than right.  K3.4.  WBC 7.5.  Lactic acid 0.9.  Hgb 11.9.  COVID-19 and influenza PCR nonreactive.  UA with moderate Hgb and 5 ketones.  Blood cultures obtained.  Patient was started on IV Unasyn for possible aspiration pneumonia and azithromycin for possible atypical infection.  Palliative medicine consulted for goals of care discussion.  The next day, desaturated to 81% requiring supplemental oxygen.  Palliative met with patient and patient's wife.  Initially, at the plan was continue antibiotics and IV fluid, emphasis on comfort and quality of life without escalation of care or transfer to higher level of care.  However, patient continued to deteriorate and started getting agitated.  Eventually, it was decided to pursue full comfort care after discussion with patient's wife at bedside.  Remains on nonrebreather until his son arrives from [place].  Subjective: Seen and examined earlier this morning.  No major events overnight.  Appears comfortable with as needed Dilaudid and nonrebreather.  Objective: Vitals:   06/22/22 1400 06/22/22 1555 06/22/22 1618 06/23/22 0305  BP: 131/69 121/68  (!) 83/48  Pulse: (!) 111 (!) 124  (!) 118  Resp: 18 18  (!) 21  Temp: 98.7 F (37.1 C) 98.4 F (36.9 C)    TempSrc:  Oral Oral    SpO2: (!) 87% (!) 70% (!) 88% (!) 87%  Weight:      Height:        Examination:  GENERAL: No apparent distress. RESP:  No IWOB.  On nonrebreather. NEURO: Somewhat somnolent. PSYCH: Calm.  No distress or agitation.  Procedures:  None  Microbiology summarized: HRCBU-38 and influenza PCR nonreactive. Blood cultures NGTD.  Assessment and plan: Principal Problem:   Bilateral pneumonia Active Problems:   Myotonic muscular dystrophy ([place])   Hypothyroidism   History of thyroid cancer   Depression   Aspiration pneumonia (HCC)   Acute metabolic encephalopathy   Debility   Goals of care, counseling/discussion   Acute respiratory failure with hypoxia (HCC)   Obesity (BMI 30-39.9)   Failure to thrive in adult   Hypokalemia  End-of-life care: DNR/DNI and full comfort care as of 06/22/2022 -As needed meds -Continue nonrebreather until his son arrives from [place] this evening -Palliative medicine following -Anticipated hospital days.  Acute respiratory failure with hypoxia due to aspiration pneumonia and myotonic muscular dystrophy-d  Debility due to myotonic muscular dystrophy/failure to thrive:   Acute metabolic encephalopathy  Normocytic anemia  Hypothyroidism  Hypokalemia:  History of depression  Obesity Body mass index is 31.11 kg/m.           DVT prophylaxis:    Code Status: DNR/DNI Family Communication: Updated patient's wife, brother and sister-in-law at bedside Level of care: Med-Surg Status is: Inpatient Remains inpatient appropriate because: End-of-life care   Final disposition: Anticipate in hospital days  Consultants:  Palliative medicine  Sch Meds:  Scheduled Meds:  ketorolac  30 mg Intravenous Once   scopolamine  1 patch Transdermal Q72H   Continuous Infusions:   PRN Meds:.antiseptic oral rinse, bisacodyl, glycopyrrolate **OR** glycopyrrolate **OR** glycopyrrolate, HYDROmorphone (DILAUDID) injection, LORazepam **OR**  LORazepam **OR** LORazepam, ondansetron **OR** ondansetron (ZOFRAN) IV, polyvinyl alcohol, sodium phosphate  Antimicrobials: Anti-infectives (From admission, onward)    Start     Dose/Rate Route Frequency Ordered Stop   06/22/22 2200  azithromycin (ZITHROMAX) 500 mg in sodium chloride 0.9 % 250 mL IVPB  Status:  Discontinued        500 mg 250 mL/hr over 60 Minutes Intravenous Every 24 hours 06/22/22 1716 06/23/22 0929   06/22/22 2000  metroNIDAZOLE (FLAGYL) IVPB 500 mg  Status:  Discontinued        500 mg 100 mL/hr over 60 Minutes Intravenous Every 12 hours 06/22/22 1621 06/23/22 0929   06/22/22 1900  ceFEPIme (MAXIPIME) 2 g in sodium chloride 0.9 % 100 mL IVPB  Status:  Discontinued        2 g 200 mL/hr over 30 Minutes Intravenous Every 8 hours 06/22/22 1621 06/23/22 0929   06/22/22 1800  vancomycin (VANCOREADY) IVPB 750 mg/150 mL  Status:  Discontinued        750 mg 150 mL/hr over 60 Minutes Intravenous Every 12 hours 06/22/22 1621 06/22/22 1833   06/22/22 0000  Ampicillin-Sulbactam (UNASYN) 3 g in sodium chloride 0.9 % 100 mL IVPB  Status:  Discontinued        3 g 200 mL/hr over 30 Minutes Intravenous Every 6 hours 06/20/2022 1722 06/22/22 1607    1800  azithromycin (ZITHROMAX) 500 mg in sodium chloride 0.9 % 250 mL IVPB  Status:  Discontinued        500 mg 250 mL/hr over 60 Minutes Intravenous Every 24 hours 05/29/2022 1716 06/22/22 1716   06/04/2022 1630  cefTRIAXone (ROCEPHIN) 2 g in sodium chloride 0.9 % 100 mL IVPB        2 g 200 mL/hr over 30 Minutes Intravenous  Once 06/15/2022 1623 06/23/2022 1736        I have personally reviewed the following labs and images: CBC: Recent Labs  Lab 06/07/2022 1441 06/22/22 0436  WBC 7.5 6.5  NEUTROABS 6.5  --   HGB 11.9* 11.2*  HCT 37.7* 35.9*  MCV 99.7 100.8*  PLT 155 169   BMP &GFR Recent Labs  Lab 05/29/2022 1441 06/22/22 0436  NA 140 143  K 3.4* 4.0  CL 103 107  CO2 30 27  GLUCOSE 95 73  BUN 19 13  CREATININE 0.61  0.49*  CALCIUM 9.1 8.9  MG  --  2.0   Estimated Creatinine Clearance: 97.1 mL/min (A) (by C-G formula based on SCr of 0.49 mg/dL (L)). Liver & Pancreas: Recent Labs  Lab 05/30/2022 1441 06/22/22 0436  AST 15 13*  ALT 12 11  ALKPHOS 54 53  BILITOT 1.1 1.2  PROT 6.0* 5.9*  ALBUMIN 2.7* 2.5*   No results for input(s): "LIPASE", "AMYLASE" in the last 168 hours. No results for input(s): "AMMONIA" in the last 168 hours. Diabetic: No results for input(s): "HGBA1C" in the last 72 hours. No results for input(s): "GLUCAP" in the last 168 hours. Cardiac Enzymes: No results for input(s): "CKTOTAL", "CKMB", "CKMBINDEX", "TROPONINI" in the last 168 hours. No results for input(s): "PROBNP" in the last 8760 hours. Coagulation Profile: Recent Labs  Lab 06/01/2022 1441  INR 1.1   Thyroid Function  Tests: No results for input(s): "TSH", "T4TOTAL", "FREET4", "T3FREE", "THYROIDAB" in the last 72 hours. Lipid Profile: No results for input(s): "CHOL", "HDL", "LDLCALC", "TRIG", "CHOLHDL", "LDLDIRECT" in the last 72 hours. Anemia Panel: No results for input(s): "VITAMINB12", "FOLATE", "FERRITIN", "TIBC", "IRON", "RETICCTPCT" in the last 72 hours. Urine analysis:    Component Value Date/Time   COLORURINE AMBER (A) 05/30/2022 1934   APPEARANCEUR CLEAR 06/04/2022 1934   LABSPEC 1.021 06/22/2022 1934   PHURINE 6.0 06/20/2022 1934   GLUCOSEU NEGATIVE 06/01/2022 1934   HGBUR MODERATE (A) 06/11/2022 1934   BILIRUBINUR NEGATIVE 06/22/2022 1934   KETONESUR 5 (A) 06/02/2022 1934   PROTEINUR NEGATIVE 06/20/2022 1934   UROBILINOGEN 0.2 11/22/2007 2045   NITRITE NEGATIVE 06/12/2022 1934   LEUKOCYTESUR NEGATIVE 06/13/2022 1934   Sepsis Labs: Invalid input(s): "PROCALCITONIN", "LACTICIDVEN"  Microbiology: Recent Results (from the past 240 hour(s))  Culture, blood (Routine x 2)     Status: None (Preliminary result)   Collection Time: 05/27/2022  2:41 PM   Specimen: BLOOD  Result Value Ref Range Status    Specimen Description   Final    BLOOD BLOOD LEFT FOREARM Performed at [facility], [address] [place], [place] [address]    Special Requests   Final    BOTTLES DRAWN AEROBIC AND ANAEROBIC Blood Culture adequate volume Performed at [facility], [address] [place], [place] [address]    Culture  Setup Time   Final    GRAM POSITIVE COCCI AEROBIC BOTTLE ONLY CRITICAL RESULT CALLED TO, READ BACK BY AND VERIFIED WITH: PHARMD MICHELLE BELL  06/23/22@4:37 BY TW Performed at [facility], [address] [place], [place] [address]    Culture GRAM POSITIVE COCCI  Final   Report Status PENDING  Incomplete  Blood Culture ID Panel (Reflexed)     Status: Abnormal   Collection Time: 06/12/2022  2:41 PM  Result Value Ref Range Status   Enterococcus faecalis NOT DETECTED NOT DETECTED Final   Enterococcus Faecium NOT DETECTED NOT DETECTED Final   Listeria monocytogenes NOT DETECTED NOT DETECTED Final   Staphylococcus species DETECTED (A) NOT DETECTED Final    Comment: CRITICAL RESULT CALLED TO, READ BACK BY AND VERIFIED WITH: PHARMD MICHELLE BELL  06/23/22@4:37 BY TW    Staphylococcus aureus (BCID) NOT DETECTED NOT DETECTED Final   Staphylococcus epidermidis NOT DETECTED NOT DETECTED Final   Staphylococcus lugdunensis NOT DETECTED NOT DETECTED Final   Streptococcus species NOT DETECTED NOT DETECTED Final   Streptococcus agalactiae NOT DETECTED NOT DETECTED Final   Streptococcus pneumoniae NOT DETECTED NOT DETECTED Final   Streptococcus pyogenes NOT DETECTED NOT DETECTED Final   A.calcoaceticus-baumannii NOT DETECTED NOT DETECTED Final   Bacteroides fragilis NOT DETECTED NOT DETECTED Final   Enterobacterales NOT DETECTED NOT DETECTED Final   Enterobacter cloacae complex NOT DETECTED NOT DETECTED Final   Escherichia coli NOT DETECTED NOT DETECTED Final   Klebsiella aerogenes NOT DETECTED NOT DETECTED Final   Klebsiella oxytoca NOT DETECTED NOT  DETECTED Final   Klebsiella pneumoniae NOT DETECTED NOT DETECTED Final   Proteus species NOT DETECTED NOT DETECTED Final   Salmonella species NOT DETECTED NOT DETECTED Final   Serratia marcescens NOT DETECTED NOT DETECTED Final   Haemophilus influenzae NOT DETECTED NOT DETECTED Final   Neisseria meningitidis NOT DETECTED NOT DETECTED Final   Pseudomonas aeruginosa NOT DETECTED NOT DETECTED Final   Stenotrophomonas maltophilia NOT DETECTED NOT DETECTED Final   Candida albicans NOT DETECTED NOT DETECTED Final     Candida auris NOT DETECTED NOT DETECTED Final   Candida glabrata NOT DETECTED NOT DETECTED Final   Candida krusei NOT DETECTED NOT DETECTED Final   Candida parapsilosis NOT DETECTED NOT DETECTED Final   Candida tropicalis NOT DETECTED NOT DETECTED Final   Cryptococcus neoformans/gattii NOT DETECTED NOT DETECTED Final    Comment: Performed at [facility], [address] [place], [place] [address]  Culture, blood (Routine x 2)     Status: None (Preliminary result)   Collection Time: 05/29/2022  2:53 PM   Specimen: BLOOD  Result Value Ref Range Status   Specimen Description   Final    BLOOD BLOOD RIGHT FOREARM Performed at [facility], [address] [place], [place] [address]    Special Requests   Final    BOTTLES DRAWN AEROBIC AND ANAEROBIC Blood Culture adequate volume Performed at [facility], [address] [place], [place] [address]    Culture   Final    NO GROWTH 2 DAYS Performed at [facility], [address] [place], [place] [address]    Report Status PENDING  Incomplete  Resp Panel by RT-PCR (Flu A&B, Covid) Anterior Nasal Swab     Status: None   Collection Time: 06/06/2022  5:13 PM   Specimen: Anterior Nasal Swab  Result Value Ref Range Status   SARS Coronavirus 2 by RT PCR NEGATIVE NEGATIVE Final    Comment: (NOTE) SARS-CoV-2 target nucleic acids are NOT DETECTED.  The SARS-CoV-2 RNA is generally detectable  in upper respiratory specimens during the acute phase of infection. The lowest concentration of SARS-CoV-2 viral copies this assay can detect is 138 copies/mL. A negative result does not preclude SARS-Cov-2 infection and should not be used as the sole basis for treatment or other patient management decisions. A negative result may occur with  improper specimen collection/handling, submission of specimen other than nasopharyngeal swab, presence of viral mutation(s) within the areas targeted by this assay, and inadequate number of viral copies(<138 copies/mL). A negative result must be combined with clinical observations, patient history, and epidemiological information. The expected result is Negative.  Fact Sheet for Patients:  https://www.fda.gov/media/152166/download  Fact Sheet for Healthcare Providers:  https://www.fda.gov/media/152162/download  This test is no t yet approved or cleared by the [place] FDA and  has been authorized for detection and/or diagnosis of SARS-CoV-2 by FDA under an Emergency Use Authorization (EUA). This EUA will remain  in effect (meaning this test can be used) for the duration of the COVID-19 declaration under Section 564(b)(1) of the Act, 21 U.S.C.section 360bbb-3(b)(1), unless the authorization is terminated  or revoked sooner.       Influenza A by PCR NEGATIVE NEGATIVE Final   Influenza B by PCR NEGATIVE NEGATIVE Final    Comment: (NOTE) The Xpert Xpress SARS-CoV-2/FLU/RSV plus assay is intended as an aid in the diagnosis of influenza from Nasopharyngeal swab specimens and should not be used as a sole basis for treatment. Nasal washings and aspirates are unacceptable for Xpert Xpress SARS-CoV-2/FLU/RSV testing.  Fact Sheet for Patients: https://www.fda.gov/media/152166/download  Fact Sheet for Healthcare Providers: https://www.fda.gov/media/152162/download  This test is not yet approved or cleared by the [place] FDA and has  been authorized for detection and/or diagnosis of SARS-CoV-2 by FDA under an Emergency Use Authorization (EUA). This EUA will remain in effect (meaning this test can be used) for the duration of the COVID-19 declaration under Section 564(b)(1) of the Act, 21 U.S.C. section 360bbb-3(b)(1), unless the authorization   is terminated or revoked.  Performed at [facility], [address] [place], [place] [address]     Radiology Studies: No results found.     T.  [facility]  If 7PM-7AM, please contact night-coverage [email or web address] 06/23/2022, 5:08 PM   

## 2022-06-23 NOTE — Progress Notes (Signed)
PHARMACY - PHYSICIAN COMMUNICATION CRITICAL VALUE ALERT - BLOOD CULTURE IDENTIFICATION (BCID)  [name] is an 61 y.o. male who presented to [facility] on 06/20/2022 with a chief complaint of AMS  Assessment:  1/4 bottles aerobic = staph species, suspect contaminant  Name of physician (or Provider) Contacted: Jenetta Downer, NP  Current antibiotics: azithromycin & cefepime & flagyl  Changes to prescribed antibiotics recommended:  Rec no treatment for suspected contaminant  Results for orders placed or performed during the hospital encounter of 06/12/2022  Blood Culture ID Panel (Reflexed) (Collected: 06/20/2022  2:41 PM)  Result Value Ref Range   Enterococcus faecalis NOT DETECTED NOT DETECTED   Enterococcus Faecium NOT DETECTED NOT DETECTED   Listeria monocytogenes NOT DETECTED NOT DETECTED   Staphylococcus species DETECTED (A) NOT DETECTED   Staphylococcus aureus (BCID) NOT DETECTED NOT DETECTED   Staphylococcus epidermidis NOT DETECTED NOT DETECTED   Staphylococcus lugdunensis NOT DETECTED NOT DETECTED   Streptococcus species NOT DETECTED NOT DETECTED   Streptococcus agalactiae NOT DETECTED NOT DETECTED   Streptococcus pneumoniae NOT DETECTED NOT DETECTED   Streptococcus pyogenes NOT DETECTED NOT DETECTED   A.calcoaceticus-baumannii NOT DETECTED NOT DETECTED   Bacteroides fragilis NOT DETECTED NOT DETECTED   Enterobacterales NOT DETECTED NOT DETECTED   Enterobacter cloacae complex NOT DETECTED NOT DETECTED   Escherichia coli NOT DETECTED NOT DETECTED   Klebsiella aerogenes NOT DETECTED NOT DETECTED   Klebsiella oxytoca NOT DETECTED NOT DETECTED   Klebsiella pneumoniae NOT DETECTED NOT DETECTED   Proteus species NOT DETECTED NOT DETECTED   Salmonella species NOT DETECTED NOT DETECTED   Serratia marcescens NOT DETECTED NOT DETECTED   Haemophilus influenzae NOT DETECTED NOT DETECTED   Neisseria meningitidis NOT DETECTED NOT DETECTED   Pseudomonas aeruginosa NOT DETECTED NOT  DETECTED   Stenotrophomonas maltophilia NOT DETECTED NOT DETECTED   Candida albicans NOT DETECTED NOT DETECTED   Candida auris NOT DETECTED NOT DETECTED   Candida glabrata NOT DETECTED NOT DETECTED   Candida krusei NOT DETECTED NOT DETECTED   Candida parapsilosis NOT DETECTED NOT DETECTED   Candida tropicalis NOT DETECTED NOT DETECTED   Cryptococcus neoformans/gattii NOT DETECTED NOT DETECTED    Eudelia Bunch, Pharm.D 06/23/2022 4:45 AM

## 2022-06-23 NOTE — Progress Notes (Signed)
Chaplain received hand-off from [employer].  Provided support with [name] and spouse at bedside.    Engaged in narrative work, opening space for Mrs Ringer to name values that are important to their relationship.  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  She notes that she and [name] met when she noticed his note on a "ride share" board and gave him a ride.  She describes "we have been together since"   Mrs Wiegman understands how to contact chaplain pager.  Will provide support through evening as needed and forward to oncoming chaplains.

## 2022-06-23 NOTE — Progress Notes (Signed)
Palliative:  HPI: 61 y.o. male  with past medical history of myotonic dystrophy type 1, aspiration pneumonia, history of thyroid cancer, hypothyroidism, ADD, depression, total care and wife primary caregiver at home admitted on 06/15/2022 with altered mental status/confusion, cough x 2-3 days, fever related to bilateral pneumonia L > R.    I met today at [name]'s bedside but I spoke with wife [name] as [name] is not really responsive at this stage. [name] is tearful. We reviewed how quickly his condition declined yesterday evening. [name] reflects on her sadness but recognizing that this is really a blessing for him to be free from his suffering. She shares that she did not believe that he would survive the night. Now she feels that he may have some time for their son to come say goodbye from [name]. [name] has been talking about wanting to see his son so we agreed that this would be important for [name] as well as their son. With this in mind [name] requests that we continue oxygen until family can visit - also acknowledging that even with oxygen he could decline further and die prior to them being able to visit. Otherwise our goal is for comfort. We will de-escalate oxygen after son arrives so as not to prolong his suffering. [name] will make calls to other family members as well.   UPDATE: I checked in with RN later in the day. [name] remains comfortable and family have been able to visit and be present to [name] as well. Son is en-route from [name]. No further needs at this time.   All questions/concerns addressed. Emotional support provided. Updated Dr. Cyndia [name] and discussed with RN.   Exam: Unresponsive. Breathing regular, unlabored on NRB. Abd soft. No distress.   Plan: - DNR - Comfort care; anticipate [name] death - De-escalate oxygen after son able to visit - Utilize opioid for pain/shortness of breath relief  50 min  Robert Sill, NP Palliative Medicine Team Pager [phone] (Please see [email or web address]  for schedule) Team Phone [phone]    Greater than 50%  of this time was spent counseling and coordinating care related to the above assessment and plan

## 2022-06-24 DIAGNOSIS — G7111 Myotonic muscular dystrophy: Secondary | ICD-10-CM | POA: Diagnosis not present

## 2022-06-24 DIAGNOSIS — J9601 Acute respiratory failure with hypoxia: Secondary | ICD-10-CM | POA: Diagnosis not present

## 2022-06-24 DIAGNOSIS — J690 Pneumonitis due to inhalation of food and vomit: Secondary | ICD-10-CM | POA: Diagnosis not present

## 2022-06-24 DIAGNOSIS — G9340 Encephalopathy, unspecified: Secondary | ICD-10-CM

## 2022-06-24 DIAGNOSIS — G9341 Metabolic encephalopathy: Secondary | ICD-10-CM | POA: Diagnosis not present

## 2022-06-25 LAB — CULTURE, BLOOD (ROUTINE X 2): Special Requests: ADEQUATE

## 2022-06-25 NOTE — Plan of Care (Signed)
End of life/ Comfort Care.  Emotional support given to family.

## 2022-06-25 NOTE — Death Summary Note (Signed)
DEATH SUMMARY   Patient Details  Name: [name] MRN: [id number] DOB: 02-Oct-1961 KNL:[id number], [name], MD Admission/Discharge Information   Admit Date:  07-02-2022  Date of Death: Date of Death: 07/05/2022  Time of Death: Time of Death: 0458  Length of Stay: 3   Principle Cause of death: Aspiration pneumonia  Hospital Diagnoses: Principal Problem:   Aspiration pneumonia ([place]) Active Problems:   Bilateral pneumonia   Acute respiratory failure with hypoxia ([place])   End of life care   Myotonic muscular dystrophy ([place])   Failure to thrive in adult   Acute metabolic encephalopathy   Hypothyroidism   History of thyroid cancer   Depression   Debility   Goals of care, counseling/discussion   Obesity (BMI 30-39.9)   Hypokalemia   Hospital Course: 61 year old M with PMH of myotonic dystrophy with total care at home, thyroid cancer, HTN, hypothyroidism, depression, ADHD and aspiration pneumonitis presenting with with cough and fever, and admitted for multifocal pneumonia.     In ED, slightly tachypneic to 20s.  Other vital stable.  Chest x-ray showed bilateral pneumonia, left greater than right.  K3.4.  WBC 7.5.  Lactic acid 0.9.  Hgb 11.9.  COVID-19 and influenza PCR nonreactive.  UA with moderate Hgb and 5 ketones.  Blood cultures obtained.  Patient was started on IV Unasyn for possible aspiration pneumonia and azithromycin for possible atypical infection.  Palliative medicine consulted for goals of care discussion.   The next day, desaturated to 81% requiring supplemental oxygen.  Palliative met with patient and patient's wife.  Initially, at the plan was continue antibiotics and IV fluid, emphasis on comfort and quality of life without escalation of care or transfer to higher level of care.  However, patient continued to deteriorate and started getting agitated.  Eventually, it was decided to pursue full comfort care after discussion with patient's wife at bedside the evening  of July 03, 2022.  Patient passed away on July 05, 2022 at 4:58 PM with patient's wife at bedside   Procedures: None  Consultations: Palliative medicine  The results of significant diagnostics from this hospitalization (including imaging, microbiology, ancillary and laboratory) are listed below for reference.   Significant Diagnostic Studies: DG Abd Portable 1V  Result Date: Jul 03, 2022 CLINICAL DATA:  Hypoxia, constipation EXAM: PORTABLE ABDOMEN - 1 VIEW COMPARISON:  01/16/2010 FINDINGS: Upper abdomen is not included in its entirety. Bowel gas pattern is nonspecific. Moderate amount of stool is seen in the visualized portions of colon. There is no fecal impaction in the rectosigmoid. There is 12 mm calcific density in the right paraspinal region at L2-L3 level. There is possible another smaller calcific density overlying the right kidney measuring proximally 8 mm. Visualized bony structures are unremarkable. IMPRESSION: Nonspecific bowel gas pattern. There are calcific densities in the region of right kidney and proximal course of right ureter suggesting possible renal and ureteral stones. If there is clinical suspicion for ureteric colic noncontrast CT may be considered. Electronically Signed   By: Elmer Picker M.D.   On: July 03, 2022 16:52   DG CHEST PORT 1 VIEW  Result Date: 2022-07-03 CLINICAL DATA:  Hypoxia EXAM: PORTABLE CHEST 1 VIEW COMPARISON:  Previous studies including the examination of 2022/07/02 FINDINGS: Transverse diameter of heart is increased. Diffuse alveolar densities seen in both lungs with significant interval worsening. Lateral CP angles are essentially clear. There is no pneumothorax. Surgical clips are seen in the thyroid bed. IMPRESSION: Extensive alveolar densities seen in both lungs with marked interval worsening. Findings suggest  multifocal pneumonia and possibly pulmonary edema. Electronically Signed   By: Elmer Picker M.D.   On: 06/22/2022 16:50   DG Chest Port 1  View  Result Date: 06/01/2022 CLINICAL DATA:  Altered mental status.  Cough.  Fever. EXAM: PORTABLE CHEST 1 VIEW COMPARISON:  05/13/2021 FINDINGS: Numerous leads and wires project over the chest. Midline trachea. Mild cardiomegaly. No pleural effusion or pneumothorax. Diffuse left-sided and right basilar predominant airspace disease. IMPRESSION: Left-greater-than-right airspace disease, most consistent with pneumonia. Followup PA and lateral chest X-ray is recommended in 3-4 weeks following trial of antibiotic therapy to ensure resolution and exclude underlying malignancy. Electronically Signed   By: Abigail Miyamoto M.D.   On: 06/07/2022 15:20    Microbiology: Recent Results (from the past 240 hour(s))  Culture, blood (Routine x 2)     Status: None (Preliminary result)   Collection Time: 06/16/2022  2:41 PM   Specimen: BLOOD  Result Value Ref Range Status   Specimen Description   Final    BLOOD BLOOD LEFT FOREARM Performed at [facility], [place] [address] [place], [place] [address]    Special Requests   Final    BOTTLES DRAWN AEROBIC AND ANAEROBIC Blood Culture adequate volume Performed at [place] [address], [place], [place] [address]    Culture  Setup Time   Final    GRAM POSITIVE COCCI AEROBIC BOTTLE ONLY CRITICAL RESULT CALLED TO, READ BACK BY AND VERIFIED WITH: PHARMD MICHELLE BELL  06/23/22$RemoveBefo'@4'qxmLOKaQszC$ :37 BY TW Performed at [facility], [place] [address], [place], [place] [address]    Culture GRAM POSITIVE COCCI  Final   Report Status PENDING  Incomplete  Blood Culture ID Panel (Reflexed)     Status: Abnormal   Collection Time: 06/02/2022  2:41 PM  Result Value Ref Range Status   Enterococcus faecalis NOT DETECTED NOT DETECTED Final   Enterococcus Faecium NOT DETECTED NOT DETECTED Final   Listeria monocytogenes NOT DETECTED NOT DETECTED Final   Staphylococcus species DETECTED (A) NOT DETECTED Final    Comment: CRITICAL RESULT CALLED TO, READ  BACK BY AND VERIFIED WITH: PHARMD MICHELLE BELL  06/23/22$RemoveBefo'@4'ZDCrGlrbEAD$ :37 BY TW    Staphylococcus aureus (BCID) NOT DETECTED NOT DETECTED Final   Staphylococcus epidermidis NOT DETECTED NOT DETECTED Final   Staphylococcus lugdunensis NOT DETECTED NOT DETECTED Final   Streptococcus species NOT DETECTED NOT DETECTED Final   Streptococcus agalactiae NOT DETECTED NOT DETECTED Final   Streptococcus pneumoniae NOT DETECTED NOT DETECTED Final   Streptococcus pyogenes NOT DETECTED NOT DETECTED Final   A.calcoaceticus-baumannii NOT DETECTED NOT DETECTED Final   Bacteroides fragilis NOT DETECTED NOT DETECTED Final   Enterobacterales NOT DETECTED NOT DETECTED Final   Enterobacter cloacae complex NOT DETECTED NOT DETECTED Final   Escherichia coli NOT DETECTED NOT DETECTED Final   Klebsiella aerogenes NOT DETECTED NOT DETECTED Final   Klebsiella oxytoca NOT DETECTED NOT DETECTED Final   Klebsiella pneumoniae NOT DETECTED NOT DETECTED Final   Proteus species NOT DETECTED NOT DETECTED Final   Salmonella species NOT DETECTED NOT DETECTED Final   Serratia marcescens NOT DETECTED NOT DETECTED Final   Haemophilus influenzae NOT DETECTED NOT DETECTED Final   Neisseria meningitidis NOT DETECTED NOT DETECTED Final   Pseudomonas aeruginosa NOT DETECTED NOT DETECTED Final   Stenotrophomonas maltophilia NOT DETECTED NOT DETECTED Final   Candida albicans NOT DETECTED NOT DETECTED Final   Candida auris NOT DETECTED NOT DETECTED Final   Candida glabrata NOT DETECTED NOT DETECTED Final  Candida krusei NOT DETECTED NOT DETECTED Final   Candida parapsilosis NOT DETECTED NOT DETECTED Final   Candida tropicalis NOT DETECTED NOT DETECTED Final   Cryptococcus neoformans/gattii NOT DETECTED NOT DETECTED Final    Comment: Performed at [facility], [place] [address], [place], [place] [address]  Culture, blood (Routine x 2)     Status: None (Preliminary result)   Collection Time: 05/28/2022  2:53 PM   Specimen: BLOOD  Result  Value Ref Range Status   Specimen Description   Final    BLOOD BLOOD RIGHT FOREARM Performed at [facility] [address] [place], [place] [address]    Special Requests   Final    BOTTLES DRAWN AEROBIC AND ANAEROBIC Blood Culture adequate volume Performed at [facility] [address] [place], [place] [address]    Culture   Final    NO GROWTH 3 DAYS Performed at [facility], [place] [address] [place], [place] [address]    Report Status PENDING  Incomplete  Resp Panel by RT-PCR (Flu A&B, Covid) Anterior Nasal Swab     Status: None   Collection Time: 06/07/2022  5:13 PM   Specimen: Anterior Nasal Swab  Result Value Ref Range Status   SARS Coronavirus 2 by RT PCR NEGATIVE NEGATIVE Final    Comment: (NOTE) SARS-CoV-2 target nucleic acids are NOT DETECTED.  The SARS-CoV-2 RNA is generally detectable in upper respiratory specimens during the acute phase of infection. The lowest concentration of SARS-CoV-2 viral copies this assay can detect is 138 copies/mL. A negative result does not preclude SARS-Cov-2 infection and should not be used as the sole basis for treatment or other patient management decisions. A negative result may occur with  improper specimen collection/handling, submission of specimen other than nasopharyngeal swab, presence of viral mutation(s) within the areas targeted by this assay, and inadequate number of viral copies(<138 copies/mL). A negative result must be combined with clinical observations, patient history, and epidemiological information. The expected result is Negative.  Fact Sheet for Patients:  EntrepreneurPulse.com.au  Fact Sheet for Healthcare Providers:  IncredibleEmployment.be  This test is no t yet approved or cleared by the [place] FDA and  has been authorized for detection and/or diagnosis of SARS-CoV-2 by FDA under an Emergency Use Authorization (EUA).  This EUA will remain  in effect (meaning this test can be used) for the duration of the COVID-19 declaration under Section 564(b)(1) of the Act, 21 U.S.C.section 360bbb-3(b)(1), unless the authorization is terminated  or revoked sooner.       Influenza A by PCR NEGATIVE NEGATIVE Final   Influenza B by PCR NEGATIVE NEGATIVE Final    Comment: (NOTE) The Xpert Xpress SARS-CoV-2/FLU/RSV plus assay is intended as an aid in the diagnosis of influenza from Nasopharyngeal swab specimens and should not be used as a sole basis for treatment. Nasal washings and aspirates are unacceptable for Xpert Xpress SARS-CoV-2/FLU/RSV testing.  Fact Sheet for Patients: EntrepreneurPulse.com.au  Fact Sheet for Healthcare Providers: IncredibleEmployment.be  This test is not yet approved or cleared by the [place] FDA and has been authorized for detection and/or diagnosis of SARS-CoV-2 by FDA under an Emergency Use Authorization (EUA). This EUA will remain in effect (meaning this test can be used) for the duration of the COVID-19 declaration under Section 564(b)(1) of the Act, 21 U.S.C. section 360bbb-3(b)(1), unless the authorization is terminated or revoked.  Performed at [facility], [place] [address], [place], [place] [address]  Time spent: 25 minutes  Signed: Mercy Riding, MD 2022/07/15

## 2022-06-25 NOTE — Progress Notes (Signed)
Called to the patient's room by his wife at 23.  The patient did not appear to be breathing.  Asked a colleague, Nena Jordan, RN to be a second assessor with myself.  There was not discernable HR, not palpable pulse, and no visible respiratory effort.  See post-mortem flowsheet for details. Attempted to call Bed Placement x 2 and did not receive an answer.

## 2022-06-25 DEATH — deceased

## 2022-06-26 LAB — CULTURE, BLOOD (ROUTINE X 2)
Culture: NO GROWTH
Special Requests: ADEQUATE

## 2022-07-06 ENCOUNTER — Telehealth (HOSPITAL_COMMUNITY): Payer: Self-pay

## 2022-07-06 NOTE — Telephone Encounter (Signed)
Wife left a vm to cancel the appt pt had with Dr De Nurse. I called and left a vm informing her that it has in fact been canceled. Nothing further is needed at this time

## 2022-07-27 ENCOUNTER — Telehealth (HOSPITAL_COMMUNITY): Payer: 59 | Admitting: Psychiatry

## 2022-08-26 ENCOUNTER — Other Ambulatory Visit (HOSPITAL_COMMUNITY): Payer: Self-pay
